# Patient Record
Sex: Female | Born: 1980 | Race: Black or African American | Hispanic: No | Marital: Married | Smoking: Never smoker
Health system: Southern US, Community
[De-identification: ages and names within clinical notes are randomized; demographics above are authoritative.]

## PROBLEM LIST (undated history)

## (undated) ENCOUNTER — Inpatient Hospital Stay (HOSPITAL_COMMUNITY): Payer: Self-pay

## (undated) DIAGNOSIS — O24112 Pre-existing diabetes mellitus, type 2, in pregnancy, second trimester: Secondary | ICD-10-CM

## (undated) DIAGNOSIS — I10 Essential (primary) hypertension: Secondary | ICD-10-CM

## (undated) DIAGNOSIS — O24419 Gestational diabetes mellitus in pregnancy, unspecified control: Secondary | ICD-10-CM

## (undated) HISTORY — PX: NO PAST SURGERIES: SHX2092

## (undated) HISTORY — DX: Pre-existing type 2 diabetes mellitus, in pregnancy, second trimester: O24.112

---

## 2002-08-17 ENCOUNTER — Inpatient Hospital Stay (HOSPITAL_COMMUNITY): Admission: AD | Admit: 2002-08-17 | Discharge: 2002-08-17 | Payer: Self-pay | Admitting: *Deleted

## 2002-10-25 ENCOUNTER — Inpatient Hospital Stay (HOSPITAL_COMMUNITY): Admission: AD | Admit: 2002-10-25 | Discharge: 2002-10-27 | Payer: Self-pay | Admitting: Obstetrics

## 2003-07-08 ENCOUNTER — Inpatient Hospital Stay (HOSPITAL_COMMUNITY): Admission: AD | Admit: 2003-07-08 | Discharge: 2003-07-08 | Payer: Self-pay | Admitting: Obstetrics & Gynecology

## 2004-04-23 ENCOUNTER — Ambulatory Visit: Payer: Self-pay | Admitting: Internal Medicine

## 2004-05-11 ENCOUNTER — Ambulatory Visit: Payer: Self-pay | Admitting: Family Medicine

## 2004-05-11 ENCOUNTER — Ambulatory Visit: Payer: Self-pay | Admitting: *Deleted

## 2004-05-17 ENCOUNTER — Ambulatory Visit: Payer: Self-pay | Admitting: Family Medicine

## 2004-05-17 DIAGNOSIS — F459 Somatoform disorder, unspecified: Secondary | ICD-10-CM | POA: Insufficient documentation

## 2004-05-29 ENCOUNTER — Ambulatory Visit: Payer: Self-pay | Admitting: Family Medicine

## 2004-05-29 DIAGNOSIS — K219 Gastro-esophageal reflux disease without esophagitis: Secondary | ICD-10-CM | POA: Insufficient documentation

## 2004-06-15 ENCOUNTER — Ambulatory Visit: Payer: Self-pay | Admitting: Family Medicine

## 2004-06-29 ENCOUNTER — Ambulatory Visit: Payer: Self-pay | Admitting: Family Medicine

## 2005-03-18 ENCOUNTER — Inpatient Hospital Stay (HOSPITAL_COMMUNITY): Admission: AD | Admit: 2005-03-18 | Discharge: 2005-03-20 | Payer: Self-pay | Admitting: Obstetrics

## 2005-04-10 ENCOUNTER — Inpatient Hospital Stay (HOSPITAL_COMMUNITY): Admission: AD | Admit: 2005-04-10 | Discharge: 2005-04-10 | Payer: Self-pay | Admitting: Obstetrics

## 2005-04-23 ENCOUNTER — Ambulatory Visit: Payer: Self-pay | Admitting: Internal Medicine

## 2005-04-24 ENCOUNTER — Ambulatory Visit: Payer: Self-pay | Admitting: Internal Medicine

## 2005-04-29 ENCOUNTER — Ambulatory Visit: Payer: Self-pay | Admitting: Internal Medicine

## 2005-05-24 ENCOUNTER — Ambulatory Visit: Payer: Self-pay | Admitting: Family Medicine

## 2005-06-14 ENCOUNTER — Ambulatory Visit: Payer: Self-pay | Admitting: Family Medicine

## 2005-06-14 DIAGNOSIS — Z8719 Personal history of other diseases of the digestive system: Secondary | ICD-10-CM | POA: Insufficient documentation

## 2005-08-16 ENCOUNTER — Ambulatory Visit: Payer: Self-pay | Admitting: Internal Medicine

## 2005-09-10 ENCOUNTER — Ambulatory Visit: Payer: Self-pay | Admitting: Family Medicine

## 2005-10-10 ENCOUNTER — Ambulatory Visit: Payer: Self-pay | Admitting: Family Medicine

## 2006-03-05 ENCOUNTER — Ambulatory Visit: Payer: Self-pay | Admitting: Family Medicine

## 2006-04-29 ENCOUNTER — Ambulatory Visit: Payer: Self-pay | Admitting: Family Medicine

## 2006-09-16 ENCOUNTER — Ambulatory Visit: Payer: Self-pay | Admitting: Family Medicine

## 2006-09-16 DIAGNOSIS — B373 Candidiasis of vulva and vagina: Secondary | ICD-10-CM

## 2006-09-16 DIAGNOSIS — B3731 Acute candidiasis of vulva and vagina: Secondary | ICD-10-CM | POA: Insufficient documentation

## 2006-10-22 ENCOUNTER — Encounter (INDEPENDENT_AMBULATORY_CARE_PROVIDER_SITE_OTHER): Payer: Self-pay | Admitting: *Deleted

## 2007-02-13 ENCOUNTER — Ambulatory Visit (HOSPITAL_COMMUNITY): Admission: RE | Admit: 2007-02-13 | Discharge: 2007-02-13 | Payer: Self-pay | Admitting: Obstetrics

## 2007-03-11 ENCOUNTER — Inpatient Hospital Stay (HOSPITAL_COMMUNITY): Admission: AD | Admit: 2007-03-11 | Discharge: 2007-03-11 | Payer: Self-pay | Admitting: Obstetrics

## 2007-07-03 ENCOUNTER — Inpatient Hospital Stay (HOSPITAL_COMMUNITY): Admission: AD | Admit: 2007-07-03 | Discharge: 2007-07-05 | Payer: Self-pay | Admitting: Obstetrics

## 2009-06-15 ENCOUNTER — Emergency Department (HOSPITAL_COMMUNITY): Admission: EM | Admit: 2009-06-15 | Discharge: 2009-06-15 | Payer: Self-pay | Admitting: Family Medicine

## 2009-11-12 ENCOUNTER — Emergency Department (HOSPITAL_COMMUNITY)
Admission: EM | Admit: 2009-11-12 | Discharge: 2009-11-12 | Payer: Self-pay | Source: Home / Self Care | Admitting: Emergency Medicine

## 2009-11-14 ENCOUNTER — Emergency Department (HOSPITAL_COMMUNITY): Admission: EM | Admit: 2009-11-14 | Discharge: 2009-11-14 | Payer: Self-pay | Admitting: Emergency Medicine

## 2009-11-16 ENCOUNTER — Emergency Department (HOSPITAL_COMMUNITY)
Admission: EM | Admit: 2009-11-16 | Discharge: 2009-11-16 | Payer: Self-pay | Source: Home / Self Care | Admitting: Emergency Medicine

## 2009-11-18 ENCOUNTER — Emergency Department (HOSPITAL_COMMUNITY): Admission: EM | Admit: 2009-11-18 | Discharge: 2009-11-18 | Payer: Self-pay | Admitting: Emergency Medicine

## 2009-11-18 ENCOUNTER — Emergency Department (HOSPITAL_COMMUNITY): Admission: EM | Admit: 2009-11-18 | Discharge: 2009-11-19 | Payer: Self-pay | Admitting: Emergency Medicine

## 2009-11-20 ENCOUNTER — Encounter (INDEPENDENT_AMBULATORY_CARE_PROVIDER_SITE_OTHER): Payer: Self-pay | Admitting: Internal Medicine

## 2009-11-23 ENCOUNTER — Encounter (INDEPENDENT_AMBULATORY_CARE_PROVIDER_SITE_OTHER): Payer: Self-pay | Admitting: Internal Medicine

## 2009-12-07 ENCOUNTER — Ambulatory Visit: Payer: Self-pay | Admitting: Internal Medicine

## 2009-12-07 DIAGNOSIS — R109 Unspecified abdominal pain: Secondary | ICD-10-CM | POA: Insufficient documentation

## 2009-12-11 ENCOUNTER — Encounter (INDEPENDENT_AMBULATORY_CARE_PROVIDER_SITE_OTHER): Payer: Self-pay | Admitting: *Deleted

## 2009-12-21 ENCOUNTER — Encounter: Payer: Self-pay | Admitting: Gastroenterology

## 2009-12-21 ENCOUNTER — Ambulatory Visit: Payer: Self-pay | Admitting: Internal Medicine

## 2009-12-21 DIAGNOSIS — R002 Palpitations: Secondary | ICD-10-CM | POA: Insufficient documentation

## 2009-12-21 LAB — CONVERTED CEMR LAB
BUN: 13 mg/dL (ref 6–23)
Basophils Absolute: 0.1 10*3/uL (ref 0.0–0.1)
Basophils Relative: 0 % (ref 0–1)
CO2: 23 meq/L (ref 19–32)
Calcium: 9.7 mg/dL (ref 8.4–10.5)
Chloride: 103 meq/L (ref 96–112)
Creatinine, Ser: 0.6 mg/dL (ref 0.40–1.20)
Eosinophils Absolute: 0.2 10*3/uL (ref 0.0–0.7)
Eosinophils Relative: 1 % (ref 0–5)
Glucose, Bld: 105 mg/dL — ABNORMAL HIGH (ref 70–99)
HCT: 37.5 % (ref 36.0–46.0)
Hemoglobin: 12.1 g/dL (ref 12.0–15.0)
Lymphocytes Relative: 17 % (ref 12–46)
Lymphs Abs: 2.3 10*3/uL (ref 0.7–4.0)
MCHC: 32.3 g/dL (ref 30.0–36.0)
MCV: 83.9 fL (ref 78.0–100.0)
Monocytes Absolute: 0.8 10*3/uL (ref 0.1–1.0)
Monocytes Relative: 5 % (ref 3–12)
Neutro Abs: 10.6 10*3/uL — ABNORMAL HIGH (ref 1.7–7.7)
Neutrophils Relative %: 76 % (ref 43–77)
Platelets: 409 10*3/uL — ABNORMAL HIGH (ref 150–400)
Potassium: 4 meq/L (ref 3.5–5.3)
RBC: 4.47 M/uL (ref 3.87–5.11)
RDW: 13.7 % (ref 11.5–15.5)
Sodium: 140 meq/L (ref 135–145)
TSH: 1.037 microintl units/mL (ref 0.350–4.500)
WBC: 13.9 10*3/uL — ABNORMAL HIGH (ref 4.0–10.5)

## 2009-12-30 ENCOUNTER — Emergency Department (HOSPITAL_COMMUNITY)
Admission: EM | Admit: 2009-12-30 | Discharge: 2009-12-30 | Payer: Self-pay | Source: Home / Self Care | Admitting: Emergency Medicine

## 2010-01-05 ENCOUNTER — Encounter: Admission: RE | Admit: 2010-01-05 | Discharge: 2010-01-05 | Payer: Self-pay | Admitting: Gastroenterology

## 2010-01-12 ENCOUNTER — Ambulatory Visit: Payer: Self-pay | Admitting: Gastroenterology

## 2010-01-13 ENCOUNTER — Emergency Department (HOSPITAL_COMMUNITY)
Admission: EM | Admit: 2010-01-13 | Discharge: 2010-01-13 | Payer: Self-pay | Source: Home / Self Care | Admitting: Family Medicine

## 2010-01-18 ENCOUNTER — Inpatient Hospital Stay (HOSPITAL_COMMUNITY)
Admission: AD | Admit: 2010-01-18 | Discharge: 2010-01-18 | Payer: Self-pay | Source: Home / Self Care | Attending: Obstetrics & Gynecology | Admitting: Obstetrics & Gynecology

## 2010-01-21 ENCOUNTER — Inpatient Hospital Stay (HOSPITAL_COMMUNITY)
Admission: AD | Admit: 2010-01-21 | Discharge: 2010-01-21 | Payer: Self-pay | Source: Home / Self Care | Attending: Obstetrics and Gynecology | Admitting: Obstetrics and Gynecology

## 2010-01-23 ENCOUNTER — Telehealth (INDEPENDENT_AMBULATORY_CARE_PROVIDER_SITE_OTHER): Payer: Self-pay | Admitting: Internal Medicine

## 2010-01-23 ENCOUNTER — Encounter (INDEPENDENT_AMBULATORY_CARE_PROVIDER_SITE_OTHER): Payer: Self-pay | Admitting: Internal Medicine

## 2010-01-23 DIAGNOSIS — D72829 Elevated white blood cell count, unspecified: Secondary | ICD-10-CM | POA: Insufficient documentation

## 2010-01-23 DIAGNOSIS — R7309 Other abnormal glucose: Secondary | ICD-10-CM | POA: Insufficient documentation

## 2010-01-30 ENCOUNTER — Emergency Department (HOSPITAL_COMMUNITY)
Admission: EM | Admit: 2010-01-30 | Discharge: 2010-01-30 | Payer: Self-pay | Source: Home / Self Care | Admitting: Emergency Medicine

## 2010-02-04 NOTE — L&D Delivery Note (Signed)
Delivery Note At 4:04 AM a viable female was delivered via Vaginal, Spontaneous Delivery (Presentation: ;  ).  APGAR: , ; weight .   Placenta status: , .  Cord: 3 vessels with the following complications: None.  Cord pH: not done  Anesthesia: Epidural  Episiotomy: Median Lacerations:  Suture Repair: 2.0 vicryl Est. Blood Loss (mL):   Mom to postpartum.  Baby to nursery-stable.  Keylen Eckenrode A 09/20/2010, 4:24 AM

## 2010-02-24 ENCOUNTER — Encounter: Payer: Self-pay | Admitting: Gastroenterology

## 2010-02-25 ENCOUNTER — Encounter: Payer: Self-pay | Admitting: Internal Medicine

## 2010-03-06 LAB — RPR: RPR: NONREACTIVE

## 2010-03-06 LAB — HIV ANTIBODY (ROUTINE TESTING W REFLEX): HIV: NONREACTIVE

## 2010-03-06 LAB — ABO/RH

## 2010-03-06 LAB — HEPATITIS B SURFACE ANTIGEN: Hepatitis B Surface Ag: NEGATIVE

## 2010-03-06 NOTE — Letter (Signed)
Summary: New Patient letter  [facility]  [address] [place], [place] [address]   Phone: [phone]  Fax: [phone]       12/11/2009 MRN: [id number]  [facility] [address], [place]  [address]  Dear Ms. Rys,  Welcome to the Gastroenterology Division at [facility].    You are scheduled to see Dr. Marina Goodell on 01/30/2010 at 9:30AM on the 3rd floor at [facility], [address].  We ask that you try to arrive at our office 15 minutes prior to your appointment time to allow for check-in.  We would like you to complete the enclosed self-administered evaluation form prior to your visit and bring it with you on the day of your appointment.  We will review it with you.  Also, please bring a complete list of all your medications or, if you prefer, bring the medication bottles and we will list them.  Please bring your insurance card so that we may make a copy of it.  If your insurance requires a referral to see a specialist, please bring your referral form from your primary care physician.  Co-payments are due at the time of your visit and may be paid by cash, check or credit card.     Your office visit will consist of a consult with your physician (includes a physical exam), any laboratory testing he/she may order, scheduling of any necessary diagnostic testing (e.g. x-ray, ultrasound, CT-scan), and scheduling of a procedure (e.g. Endoscopy, Colonoscopy) if required.  Please allow enough time on your schedule to allow for any/all of these possibilities.    If you cannot keep your appointment, please call [phone] to cancel or reschedule prior to your appointment date.  This allows [place] the opportunity to schedule an appointment for another patient in need of care.  If you do not cancel or reschedule by 5 p.m. the business day prior to your appointment date, you will be charged a $50.00 late cancellation/no-show fee.    Thank you for choosing  [facility] for your medical needs.  We appreciate the opportunity to care for you.  Please visit [place] at our website  to learn more about our practice.                     Sincerely,                                                             The Gastroenterology Division

## 2010-03-06 NOTE — Assessment & Plan Note (Signed)
Summary: NEW PT EST // RS   Vital Signs:  Patient profile:   30 year old female Height:      62 inches Weight:      163 pounds BMI:     29.92 Temp:     98.6 degrees F oral BP sitting:   114 / 76  (right arm) Cuff size:   regular  Vitals Entered By: Duard Brady LPN (December 07, 2009 2:55 PM) CC: new to establish - abd pain, bloating, nausea , diarrhea and constipation Is Patient Diabetic? No   CC:  new to establish - abd pain, bloating, nausea , and diarrhea and constipation.  History of Present Illness: 30 year old patient who is seen today as a new patient.  For the past 4 weeks, she has had considerable abdominal pain.  She states that she has been seen at [facility] EDM 4 locations, but no records are available.  She has had a CT abdominal scan.  Associated symptoms include bloating, chills, but no fever.  She has had constipation alternating with diarrhea.  She has had nausea, vomiting, and describes herself as being quite anxious.  She states that she was diagnosed with ulcers approximately 15 years ago in [place].  She states that she has had upper endoscopy by [facility] a few years ago.  that was normal.  She has been placed on a number of medications, including PPI, and anxiety, pancreatic enzymes, and isosmotic, as well as Align.  Her symptoms have not improved.  She states she has lost 10 pounds in weight over the past month.  She apparently  has been seen by a number of local physicians.  Preventive Screening-Counseling & Management  Alcohol-Tobacco     Smoking Status: never  Allergies (verified): No Known Drug Allergies  Past History:  Past Medical History: abdominal pain history of peptic ulcer disease  Past Surgical History: denies  Family History: Reviewed history and no changes required. father age 3.  History of hypertension, and diabetes mother also, history of hypertension, and diabetes one brother two sisters positive for peptic ulcer  disease  Social History: Married Never Smoked 3 childrenSmoking Status:  never  Review of Systems       The patient complains of anorexia and abdominal pain.  The patient denies fever, weight loss, weight gain, vision loss, decreased hearing, hoarseness, chest pain, syncope, dyspnea on exertion, peripheral edema, prolonged cough, headaches, hemoptysis, melena, hematochezia, severe indigestion/heartburn, hematuria, incontinence, genital sores, muscle weakness, suspicious skin lesions, transient blindness, difficulty walking, depression, unusual weight change, abnormal bleeding, enlarged lymph nodes, angioedema, and breast masses.    Physical Exam  General:  overweight-appearing.  anxious no acute distress Head:  Normocephalic and atraumatic without obvious abnormalities. No apparent alopecia or balding. Eyes:  No corneal or conjunctival inflammation noted. EOMI. Perrla. Funduscopic exam benign, without hemorrhages, exudates or papilledema. Vision grossly normal. Ears:  External ear exam shows no significant lesions or deformities.  Otoscopic examination reveals clear canals, tympanic membranes are intact bilaterally without bulging, retraction, inflammation or discharge. Hearing is grossly normal bilaterally. Mouth:  Oral mucosa and oropharynx without lesions or exudates.  Teeth in good repair. Neck:  No deformities, masses, or tenderness noted. Lungs:  Normal respiratory effort, chest expands symmetrically. Lungs are clear to auscultation, no crackles or wheezes. Heart:  resting tachycardia of about 100 Abdomen:  obese soft and nontender.  Bowel sounds active Msk:  No deformity or scoliosis noted of thoracic or lumbar spine.  Pulses:  R and L carotid,radial,femoral,dorsalis pedis and posterior tibial pulses are full and equal bilaterally Extremities:  No clubbing, cyanosis, edema, or deformity noted with normal full range of motion of all joints.   Skin:  Intact without suspicious lesions  or rashes Cervical Nodes:  No lymphadenopathy noted Axillary Nodes:  No palpable lymphadenopathy Inguinal Nodes:  No significant adenopathy Psych:  anxious   Impression & Recommendations:  Problem # 1:  Preventive Health Care (ICD-V70.0)  Problem # 2:  ABDOMINAL PAIN (ICD-789.00)  due to the chronicity of her symptoms and lack of response to multiple medications.  Will refer to GI  Orders: Gastroenterology Referral (GI)  Complete Medication List: 1)  Abx - Unsure On Name - Didn't Bring  .... Bid 2)  Librax 2.5-5 Mg Caps (Clidinium-chlordiazepoxide) .... Q6-8 hrs prn 3)  Align 4 Mg Caps (Probiotic product) .... Qd 4)  Multivitamins Tabs (Multiple vitamin) .... Qd 5)  Diazepam 5 Mg Tabs (Diazepam) .Marland Kitchen.. 1 by mouth q6hrs as needed muscle spasm 6)  Phazyme 180 Mg Caps (Simethicone) .... As needed gas 7)  Dicyclomine Hcl 20 Mg Tabs (Dicyclomine hcl) .... Qid as needed abd. pain 8)  Nexium 20 Mg Cpdr (Esomeprazole magnesium) .... Three times a day 9)  Dexilant 60 Mg Cpdr (Dexlansoprazole) .... Qd  Patient Instructions: 1)  Drink clear liquids only for the next 24 hours, then slowly add other liquids and food as you  tolerate them. 2)  GI evaluation as scheduled 3)  Continue Nexium or Dexilant    Orders Added: 1)  Est. Patient 18-39 years [99395] 2)  Gastroenterology Referral [GI]

## 2010-03-06 NOTE — Assessment & Plan Note (Addendum)
Summary: NECK PAIN//MC   Vital Signs:  Patient profile:   30 year old female Menstrual status:  irregular LMP:     12/12/2009 Weight:      161 pounds Temp:     98.4 degrees F oral Pulse rate:   80 / minute Pulse rhythm:   irregularly irregular Resp:     20 per minute BP sitting:   110 / 70  (left arm) Cuff size:   regular  Vitals Entered By: Gaylyn Cheers RN (December 21, 2009 2:23 PM) CC: neck pain, feels shakey sometimes and heart thumps @ intervals. chills, wants to be tested for everything Is Patient Diabetic? No Pain Assessment Patient in pain? yes     Location: neck Intensity: 3 Type: dull Onset of pain  Intermittent  Does patient need assistance? Functional Status Self care Ambulation Normal Comments Does not take any medications LMP (date): 12/12/2009     Menstrual Status irregular Enter LMP: 12/12/2009   CC:  neck pain, feels shakey sometimes and heart thumps @ intervals. chills, and wants to be tested for everything.  History of Present Illness: 1.  Pain in posterior neck and bilateral trapezius for 1 month.  Also with discomfort in chest.  2.  Wants thyroid tested as feels jittery, palpitations.  Does not have a big appetite.  No diarrhea.  Has had some weight loss.  Feels cold, not heat intolerance.  sometimes distal fingers numb  3.  Abdominal pain:  has resolved--pt. went to another primary earlier in month--did not go to GI specialist as symptoms resolved.  Discussed picking one office to provide primary care.  Allergies (verified): No Known Drug Allergies  Physical Exam  General:  NAD Eyes:  No exophthalmos, no definite lid lag. Neck:  No deformities, masses, or tenderness noted.no thyromegaly and no thyroid nodules or tenderness.   Lungs:  Normal respiratory effort, chest expands symmetrically. Lungs are clear to auscultation, no crackles or wheezes. Heart:  Normal rate and regular rhythm. S1 and S2 normal without gallop, murmur, click, rub  or other extra sounds. Msk:  Tender across bilateral traps to shoulders.  NT over spinous processes. Neurologic:  Fine tremor bilaterally strength normal in all extremities and DTRs symmetrical and normal.     Impression & Recommendations:  Problem # 1:  PALPITATIONS (ICD-785.1)  and tremulousness  Orders: T-Basic Metabolic Panel [phone]) T-CBC w/Diff [phone]) T-TSH ([phone])  Complete Medication List: 1)  Terazol 0.04% Vaginal Cream  .... 1 applic pv qhs x7days 2)  Abx - Unsure On Name - Didn't Bring  .... Bid 3)  Librax 2.5-5 Mg Caps (Clidinium-chlordiazepoxide) .... Q6-8 hrs prn 4)  Align 4 Mg Caps (Probiotic product) .... Qd 5)  Multivitamins Tabs (Multiple vitamin) .... Qd 6)  Diazepam 5 Mg Tabs (Diazepam) .Marland Kitchen.. 1 by mouth q6hrs as needed muscle spasm 7)  Phazyme 180 Mg Caps (Simethicone) .... As needed gas 8)  Dicyclomine Hcl 20 Mg Tabs (Dicyclomine hcl) .... Qid as needed abd. pain 9)  Nexium 20 Mg Cpdr (Esomeprazole magnesium) .... Three times a day 10)  Dexilant 60 Mg Cpdr (Dexlansoprazole) .... Qd  Patient Instructions: 1)  Call if symptoms worsen   Orders Added: 1)  T-Basic Metabolic Panel [[id number]] 2)  T-CBC w/Diff [[id number]] 3)  T-TSH [[id number]] 4)  Est. Patient Level III [02725]  Appended Document: NECK PAIN//MC Received paperwork from [facility] 02/12/10--pt. did go to Dr. Ewing Schlein on 10/17 and 11/23/2009:  Upper GI series performed, but no follow up comment on  that.  Diagnosed with reflux esophagitis and given Dexilant samples to tak einstead of Nexium giving at first visit.  Was told to follow up with [place] for neck complaints.  Also given Librax for abdominal pain and bloating.   Clinical Lists Changes       Appended Document: NECK PAIN//MC CT of abdomen and upper GI with small bowel follow through were normal on follow up with Dr. Ewing Schlein  01/23/10.

## 2010-03-06 NOTE — Letter (Signed)
Summary: New Patient letter  [facility]  [address] [place], [place] [address]   Phone: [phone]  Fax: [phone]       12/21/2009 MRN: [id number]  [facility] [address] [place], [place]  [address]  Dear Ms. Baisch,  Welcome to the Gastroenterology Division at [facility].    You are scheduled to see Dr.  Jarold Motto on 01-12-10 at 8:30am on the 3rd floor at [facility], [address].  We ask that you try to arrive at our office 15 minutes prior to your appointment time to allow for check-in.  We would like you to complete the enclosed self-administered evaluation form prior to your visit and bring it with you on the day of your appointment.  We will review it with you.  Also, please bring a complete list of all your medications or, if you prefer, bring the medication bottles and we will list them.  Please bring your insurance card so that we may make a copy of it.  If your insurance requires a referral to see a specialist, please bring your referral form from your primary care physician.  Co-payments are due at the time of your visit and may be paid by cash, check or credit card. If no insurance coverage $184 is due at time of service.    Your office visit will consist of a consult with your physician (includes a physical exam), any laboratory testing he/she may order, scheduling of any necessary diagnostic testing (e.g. x-ray, ultrasound, CT-scan), and scheduling of a procedure (e.g. Endoscopy, Colonoscopy) if required.  Please allow enough time on your schedule to allow for any/all of these possibilities.    If you cannot keep your appointment, please call [phone] to cancel or reschedule prior to your appointment date.  This allows [place] the opportunity to schedule an appointment for another patient in need of care.  If you do not cancel or reschedule by 5 p.m. the business day prior to your appointment date, you will be charged a  $50.00 late cancellation/no-show fee.    Thank you for choosing [facility] for your medical needs.  We appreciate the opportunity to care for you.  Please visit [place] at our website  to learn more about our practice.                     Sincerely,                                                             The Gastroenterology Division

## 2010-03-08 NOTE — Progress Notes (Signed)
  Phone Note Outgoing Call   Summary of Call: Appt. for 12.27 cancelled Let her know her labs in November, including thyroid testing, were okay other than a mildy high glucose and white blood cell count Please have pt. come in for repeat CBC and fasting glucose in next month. See if she is still having palpitations that are causing symptoms.  Initial call taken by: Julieanne Manson MD,  January 23, 2010 5:35 AM  Follow-up for Phone Call        spoke with pt's husband and he let me know that she  is pregnant... husband says she is still having palpations and her symptoms with her stomach... [facility].... pt cancelled the appt with [facility] on 01-30-10... they would like to know do you need to see them.... husband will call when he knows his schedule in Jan. for pt to come and do labs Follow-up by: [place] Shannon,  January 23, 2010 9:16 AM  Additional Follow-up for Phone Call Additional follow up Details #1::        Check to see what meds she is actually taking right now--need to make sure they are okay with pregnancy Did they address her syptoms at [facility]? Need that record. For now, just want repeat labs done and make sure she is on Prenatal vitamin and not taking any meds that could cause a problem   Additional Follow-up by: Julieanne Manson MD,  January 24, 2010 2:28 PM  New Problems: HYPERGLYCEMIA, BORDERLINE (ICD-790.29) LEUKOCYTOSIS (ICD-288.60)   Additional Follow-up for Phone Call Additional follow up Details #2::    spoke with pt's husband and he says the pt is no longer taking any meds but prenatal vitamins.... husband says that they did mention the problem to [facility] when they went and they are watching her with follow ups... husband says he will call us next week to set up lab appt... will retrieve medical records from [facility] and give them to shelia so she will put them in the right place. Follow-up by: [place] Shannon,  January 26, 2010  8:53 AM  New Problems: HYPERGLYCEMIA, BORDERLINE (ICD-790.29) LEUKOCYTOSIS (ICD-288.60)

## 2010-03-10 ENCOUNTER — Emergency Department (HOSPITAL_COMMUNITY)
Admission: EM | Admit: 2010-03-10 | Discharge: 2010-03-10 | Disposition: A | Discharge status: Home / Self Care | Payer: Medicaid Other | Attending: Emergency Medicine | Admitting: Emergency Medicine

## 2010-03-10 DIAGNOSIS — O99891 Other specified diseases and conditions complicating pregnancy: Secondary | ICD-10-CM | POA: Insufficient documentation

## 2010-03-10 DIAGNOSIS — R5383 Other fatigue: Secondary | ICD-10-CM | POA: Insufficient documentation

## 2010-03-10 DIAGNOSIS — R209 Unspecified disturbances of skin sensation: Secondary | ICD-10-CM | POA: Insufficient documentation

## 2010-03-10 DIAGNOSIS — R5381 Other malaise: Secondary | ICD-10-CM | POA: Insufficient documentation

## 2010-03-10 LAB — URINALYSIS, ROUTINE W REFLEX MICROSCOPIC
Hgb urine dipstick: NEGATIVE
Ketones, ur: NEGATIVE mg/dL
Protein, ur: NEGATIVE mg/dL
Specific Gravity, Urine: 1.008 (ref 1.005–1.030)
Urine Glucose, Fasting: NEGATIVE mg/dL
pH: 6.5 (ref 5.0–8.0)

## 2010-03-10 LAB — URINE MICROSCOPIC-ADD ON

## 2010-03-10 LAB — POCT I-STAT, CHEM 8
BUN: <3 mg/dL — ABNORMAL LOW (ref 6–23)
Calcium, Ion: 1.19 mmol/L (ref 1.12–1.32)
Chloride: 102 mEq/L (ref 96–112)
Creatinine, Ser: 0.5 mg/dL (ref 0.4–1.2)
Glucose, Bld: 148 mg/dL — ABNORMAL HIGH (ref 70–99)
HCT: 36 % (ref 36.0–46.0)
Potassium: 3.3 mEq/L — ABNORMAL LOW (ref 3.5–5.1)
Sodium: 137 mEq/L (ref 135–145)
TCO2: 22 mmol/L (ref 0–100)

## 2010-04-02 ENCOUNTER — Encounter (INDEPENDENT_AMBULATORY_CARE_PROVIDER_SITE_OTHER): Payer: Self-pay | Admitting: Internal Medicine

## 2010-04-12 NOTE — Letter (Signed)
Summary: [facility]//OFFICE VISIT  [facility]//OFFICE VISIT   Imported By: Arta Bruce 04/04/2010 11:32:21  _____________________________________________________________________  External Attachment:    Type:   Image     Comment:   External Document

## 2010-04-12 NOTE — Letter (Signed)
Summary: [facility]//OFFICE VISIT  [facility]//OFFICE VISIT   Imported By: Arta Bruce 04/04/2010 11:35:03  _____________________________________________________________________  External Attachment:    Type:   Image     Comment:   External Document

## 2010-04-12 NOTE — Miscellaneous (Signed)
Summary: ED visit for GI concerns/pregnancy  Clinical Lists Changes   ED visit on 01/21/10:  Pt. at [facility] ED for vomiting.  Recent previous visit for same.  Pt. at 5.[redacted] weeks gestation pregnancy on day of visit.  Pt. felt a continuation of problems prior to pregnancy.  Extensive work up prior,reportedly including CT, and was to have upper GI series with Dr Ewing Schlein and no findings--treated with Librax and Dexilant at the time.   Pt. treated for nausea and told to eat frequent small meals. Has follow up with OB

## 2010-04-12 NOTE — Letter (Signed)
Summary: [facility]/PROGRESS NOTE  [facility]/PROGRESS NOTE   Imported By: Arta Bruce 04/04/2010 11:36:26  _____________________________________________________________________  External Attachment:    Type:   Image     Comment:   External Document

## 2010-04-16 LAB — CBC
HCT: 35.9 % — ABNORMAL LOW (ref 36.0–46.0)
HCT: 35.9 % — ABNORMAL LOW (ref 36.0–46.0)
Hemoglobin: 12.2 g/dL (ref 12.0–15.0)
Hemoglobin: 12.2 g/dL (ref 12.0–15.0)
MCH: 27.4 pg (ref 26.0–34.0)
MCH: 27.5 pg (ref 26.0–34.0)
MCHC: 34 g/dL (ref 30.0–36.0)
MCHC: 34 g/dL (ref 30.0–36.0)
MCV: 80.5 fL (ref 78.0–100.0)
MCV: 80.9 fL (ref 78.0–100.0)
Platelets: 375 10*3/uL (ref 150–400)
Platelets: 383 10*3/uL (ref 150–400)
RBC: 4.44 MIL/uL (ref 3.87–5.11)
RBC: 4.46 MIL/uL (ref 3.87–5.11)
RDW: 13.5 % (ref 11.5–15.5)
RDW: 13.7 % (ref 11.5–15.5)
WBC: 11.9 10*3/uL — ABNORMAL HIGH (ref 4.0–10.5)
WBC: 12.4 10*3/uL — ABNORMAL HIGH (ref 4.0–10.5)

## 2010-04-16 LAB — COMPREHENSIVE METABOLIC PANEL
ALT: 22 U/L (ref 0–35)
ALT: 31 U/L (ref 0–35)
AST: 18 U/L (ref 0–37)
AST: 22 U/L (ref 0–37)
Albumin: 3.6 g/dL (ref 3.5–5.2)
Albumin: 3.8 g/dL (ref 3.5–5.2)
Alkaline Phosphatase: 61 U/L (ref 39–117)
Alkaline Phosphatase: 62 U/L (ref 39–117)
BUN: 3 mg/dL — ABNORMAL LOW (ref 6–23)
BUN: 4 mg/dL — ABNORMAL LOW (ref 6–23)
CO2: 26 mEq/L (ref 19–32)
CO2: 27 mEq/L (ref 19–32)
Calcium: 10.2 mg/dL (ref 8.4–10.5)
Calcium: 9.2 mg/dL (ref 8.4–10.5)
Chloride: 101 mEq/L (ref 96–112)
Chloride: 102 mEq/L (ref 96–112)
Creatinine, Ser: 0.48 mg/dL (ref 0.4–1.2)
Creatinine, Ser: 0.6 mg/dL (ref 0.4–1.2)
GFR calc Af Amer: >60 mL/min (ref >60)
GFR calc Af Amer: >60 mL/min (ref >60)
GFR calc non Af Amer: >60 mL/min (ref >60)
GFR calc non Af Amer: >60 mL/min (ref >60)
Glucose, Bld: 102 mg/dL — ABNORMAL HIGH (ref 70–99)
Glucose, Bld: 89 mg/dL (ref 70–99)
Potassium: 3.8 mEq/L (ref 3.5–5.1)
Potassium: 3.8 mEq/L (ref 3.5–5.1)
Sodium: 136 mEq/L (ref 135–145)
Sodium: 138 mEq/L (ref 135–145)
Total Bilirubin: 0.3 mg/dL (ref 0.3–1.2)
Total Bilirubin: 0.3 mg/dL (ref 0.3–1.2)
Total Protein: 7.5 g/dL (ref 6.0–8.3)
Total Protein: 8.2 g/dL (ref 6.0–8.3)

## 2010-04-16 LAB — URINALYSIS, ROUTINE W REFLEX MICROSCOPIC
Bilirubin Urine: NEGATIVE
Bilirubin Urine: NEGATIVE
Glucose, UA: NEGATIVE mg/dL
Glucose, UA: NEGATIVE mg/dL
Hgb urine dipstick: NEGATIVE
Hgb urine dipstick: NEGATIVE
Ketones, ur: NEGATIVE mg/dL
Ketones, ur: NEGATIVE mg/dL
Nitrite: NEGATIVE
Nitrite: NEGATIVE
Protein, ur: NEGATIVE mg/dL
Protein, ur: NEGATIVE mg/dL
Specific Gravity, Urine: 1.02 (ref 1.005–1.030)
Specific Gravity, Urine: <1.005 — ABNORMAL LOW (ref 1.005–1.030)
Urobilinogen, UA: 0.2 mg/dL (ref 0.0–1.0)
Urobilinogen, UA: 0.2 mg/dL (ref 0.0–1.0)
pH: 6.5 (ref 5.0–8.0)
pH: 6.5 (ref 5.0–8.0)

## 2010-04-16 LAB — URINE MICROSCOPIC-ADD ON

## 2010-04-16 LAB — POCT PREGNANCY, URINE: Preg Test, Ur: POSITIVE

## 2010-04-16 LAB — H. PYLORI ANTIBODY, IGG: H Pylori IgG: 4.1 {ISR} — ABNORMAL HIGH

## 2010-04-17 LAB — POCT PREGNANCY, URINE
Preg Test, Ur: POSITIVE
Preg Test, Ur: POSITIVE

## 2010-04-18 LAB — DIFFERENTIAL
Basophils Absolute: 0 10*3/uL (ref 0.0–0.1)
Eosinophils Absolute: 0.1 10*3/uL (ref 0.0–0.7)
Lymphocytes Relative: 18 % (ref 12–46)
Lymphs Abs: 2.5 10*3/uL (ref 0.7–4.0)
Neutrophils Relative %: 77 % (ref 43–77)

## 2010-04-18 LAB — URINALYSIS, ROUTINE W REFLEX MICROSCOPIC
Bilirubin Urine: NEGATIVE
Glucose, UA: NEGATIVE mg/dL
Hgb urine dipstick: NEGATIVE
Ketones, ur: NEGATIVE mg/dL
Nitrite: NEGATIVE
Protein, ur: NEGATIVE mg/dL
Specific Gravity, Urine: 1.003 — ABNORMAL LOW (ref 1.005–1.030)
Urobilinogen, UA: 0.2 mg/dL (ref 0.0–1.0)
pH: 6.5 (ref 5.0–8.0)

## 2010-04-18 LAB — COMPREHENSIVE METABOLIC PANEL
ALT: 25 U/L (ref 0–35)
CO2: 27 mEq/L (ref 19–32)
Calcium: 9.4 mg/dL (ref 8.4–10.5)
Creatinine, Ser: 0.63 mg/dL (ref 0.4–1.2)
GFR calc non Af Amer: >60 mL/min (ref >60)
Glucose, Bld: 124 mg/dL — ABNORMAL HIGH (ref 70–99)

## 2010-04-18 LAB — CBC
HCT: 37 % (ref 36.0–46.0)
Hemoglobin: 12.6 g/dL (ref 12.0–15.0)
MCH: 28.7 pg (ref 26.0–34.0)
MCHC: 34.1 g/dL (ref 30.0–36.0)

## 2010-04-18 LAB — POCT PREGNANCY, URINE
Preg Test, Ur: NEGATIVE
Preg Test, Ur: NEGATIVE

## 2010-04-18 LAB — LIPASE, BLOOD: Lipase: 28 U/L (ref 11–59)

## 2010-04-18 LAB — POCT CARDIAC MARKERS: CKMB, poc: <1 ng/mL — ABNORMAL LOW (ref 1.0–8.0)

## 2010-04-19 LAB — COMPREHENSIVE METABOLIC PANEL
AST: 27 U/L (ref 0–37)
Albumin: 3.7 g/dL (ref 3.5–5.2)
Alkaline Phosphatase: 59 U/L (ref 39–117)
BUN: 9 mg/dL (ref 6–23)
CO2: 29 mEq/L (ref 19–32)
Chloride: 103 mEq/L (ref 96–112)
Creatinine, Ser: 0.6 mg/dL (ref 0.4–1.2)
GFR calc non Af Amer: >60 mL/min (ref >60)
Potassium: 4.2 mEq/L (ref 3.5–5.1)
Total Bilirubin: 0.6 mg/dL (ref 0.3–1.2)

## 2010-04-19 LAB — URINE MICROSCOPIC-ADD ON

## 2010-04-19 LAB — POCT URINALYSIS DIPSTICK
Ketones, ur: NEGATIVE mg/dL
Protein, ur: 100 mg/dL — AB
pH: 6 (ref 5.0–8.0)

## 2010-04-19 LAB — URINALYSIS, ROUTINE W REFLEX MICROSCOPIC
Bilirubin Urine: NEGATIVE
Glucose, UA: NEGATIVE mg/dL
Ketones, ur: NEGATIVE mg/dL
pH: 6 (ref 5.0–8.0)

## 2010-04-19 LAB — CBC
Hemoglobin: 12.3 g/dL (ref 12.0–15.0)
MCH: 28.2 pg (ref 26.0–34.0)
MCV: 85.1 fL (ref 78.0–100.0)
RBC: 4.36 MIL/uL (ref 3.87–5.11)
WBC: 7.6 10*3/uL (ref 4.0–10.5)

## 2010-04-19 LAB — LIPASE, BLOOD: Lipase: 32 U/L (ref 11–59)

## 2010-04-19 LAB — DIFFERENTIAL
Basophils Absolute: 0 10*3/uL (ref 0.0–0.1)
Basophils Relative: 0 % (ref 0–1)
Eosinophils Relative: 2 % (ref 0–5)
Lymphocytes Relative: 24 % (ref 12–46)
Monocytes Absolute: 0.5 10*3/uL (ref 0.1–1.0)
Neutro Abs: 5 10*3/uL (ref 1.7–7.7)

## 2010-04-19 LAB — URINE CULTURE: Culture  Setup Time: 201110111524

## 2010-06-22 NOTE — Op Note (Signed)
   NAME:  DACI, STUBBE                         ACCOUNT NO.:  [id number]   MEDICAL RECORD NO.:  [id number]                   PATIENT TYPE:  INP   LOCATION:  9131                                 FACILITY:  [facility]   PHYSICIAN:  Kathreen Cosier, M.D.           DATE OF BIRTH:  10-11-1980   DATE OF PROCEDURE:  10/25/2002  DATE OF DISCHARGE:                                 OPERATIVE REPORT   PREOPERATIVE DIAGNOSIS:  Pushed to +3 station and exhausted.   PROCEDURE:  Vacuum application.   The patient is a 30 year old primigravida who pushed for an hour after being  fully dilated, then she pushed to +3 station.  There was a lot of molding.  The perineum was injected with 1% Xylocaine 10 mL, and the midline  episiotomy cut.  The patient was exhausted.  The vacuum applied and delivery  effected through one contraction.  There was no pop-off.  She had a fourth  degree extension.  The rectum was repaired in two layers, the anal sphincter  repaired, and then the episiotomy closed in a normal manner.  The placenta  was spontaneous.  Post repair with 2-0 Vicryl, the rectum was intact and the  anal sphincter intact.  The episiotomy closed in a normal manner with 2-0  Vicryl.  The patient tolerated the procedure well.                                               Kathreen Cosier, M.D.    BAM/MEDQ  D:  10/25/2002  T:  10/26/2002  Job:  [id number]

## 2010-07-11 ENCOUNTER — Inpatient Hospital Stay (HOSPITAL_COMMUNITY)
Admission: AD | Admit: 2010-07-11 | Discharge: 2010-07-11 | Disposition: A | Discharge status: Home / Self Care | Payer: Medicaid Other | Source: Ambulatory Visit | Attending: Obstetrics | Admitting: Obstetrics

## 2010-07-11 DIAGNOSIS — J40 Bronchitis, not specified as acute or chronic: Secondary | ICD-10-CM

## 2010-07-11 DIAGNOSIS — O99891 Other specified diseases and conditions complicating pregnancy: Secondary | ICD-10-CM | POA: Insufficient documentation

## 2010-07-11 DIAGNOSIS — O9989 Other specified diseases and conditions complicating pregnancy, childbirth and the puerperium: Secondary | ICD-10-CM

## 2010-07-15 ENCOUNTER — Inpatient Hospital Stay (HOSPITAL_COMMUNITY)
Admission: AD | Admit: 2010-07-15 | Discharge: 2010-07-15 | Disposition: A | Discharge status: Home / Self Care | Payer: Medicaid Other | Source: Ambulatory Visit | Attending: Obstetrics | Admitting: Obstetrics

## 2010-07-15 DIAGNOSIS — J069 Acute upper respiratory infection, unspecified: Secondary | ICD-10-CM

## 2010-07-15 DIAGNOSIS — O99891 Other specified diseases and conditions complicating pregnancy: Secondary | ICD-10-CM | POA: Insufficient documentation

## 2010-07-15 DIAGNOSIS — O9989 Other specified diseases and conditions complicating pregnancy, childbirth and the puerperium: Secondary | ICD-10-CM

## 2010-07-21 ENCOUNTER — Inpatient Hospital Stay (HOSPITAL_COMMUNITY)
Admission: AD | Admit: 2010-07-21 | Discharge: 2010-07-21 | Disposition: A | Discharge status: Home / Self Care | Payer: Medicaid Other | Source: Ambulatory Visit | Attending: Obstetrics | Admitting: Obstetrics

## 2010-07-21 DIAGNOSIS — O99891 Other specified diseases and conditions complicating pregnancy: Secondary | ICD-10-CM | POA: Insufficient documentation

## 2010-07-21 DIAGNOSIS — O9989 Other specified diseases and conditions complicating pregnancy, childbirth and the puerperium: Secondary | ICD-10-CM

## 2010-07-21 DIAGNOSIS — J069 Acute upper respiratory infection, unspecified: Secondary | ICD-10-CM | POA: Insufficient documentation

## 2010-07-21 DIAGNOSIS — N393 Stress incontinence (female) (male): Secondary | ICD-10-CM

## 2010-08-05 ENCOUNTER — Inpatient Hospital Stay (HOSPITAL_COMMUNITY)
Admission: AD | Admit: 2010-08-05 | Discharge: 2010-08-05 | Disposition: A | Discharge status: Home / Self Care | Payer: Medicaid Other | Source: Ambulatory Visit | Attending: Obstetrics | Admitting: Obstetrics

## 2010-08-05 ENCOUNTER — Inpatient Hospital Stay (HOSPITAL_COMMUNITY): Payer: Medicaid Other

## 2010-08-05 DIAGNOSIS — R05 Cough: Secondary | ICD-10-CM | POA: Insufficient documentation

## 2010-08-05 DIAGNOSIS — O99891 Other specified diseases and conditions complicating pregnancy: Secondary | ICD-10-CM | POA: Insufficient documentation

## 2010-08-05 DIAGNOSIS — R059 Cough, unspecified: Secondary | ICD-10-CM | POA: Insufficient documentation

## 2010-08-05 LAB — URINALYSIS, ROUTINE W REFLEX MICROSCOPIC
Bilirubin Urine: NEGATIVE
Glucose, UA: NEGATIVE mg/dL
Hgb urine dipstick: NEGATIVE
Ketones, ur: NEGATIVE mg/dL
Nitrite: NEGATIVE
Specific Gravity, Urine: 1.015 (ref 1.005–1.030)
pH: 6 (ref 5.0–8.0)

## 2010-08-05 LAB — COMPREHENSIVE METABOLIC PANEL
ALT: 16 U/L (ref 0–35)
AST: 19 U/L (ref 0–37)
Albumin: 2.5 g/dL — ABNORMAL LOW (ref 3.5–5.2)
Alkaline Phosphatase: 93 U/L (ref 39–117)
Calcium: 9.2 mg/dL (ref 8.4–10.5)
Potassium: 3.9 mEq/L (ref 3.5–5.1)
Sodium: 133 mEq/L — ABNORMAL LOW (ref 135–145)
Total Protein: 6.4 g/dL (ref 6.0–8.3)

## 2010-08-05 LAB — URINE MICROSCOPIC-ADD ON

## 2010-08-05 LAB — CBC
Hemoglobin: 9.4 g/dL — ABNORMAL LOW (ref 12.0–15.0)
MCH: 23.4 pg — ABNORMAL LOW (ref 26.0–34.0)
MCHC: 32.4 g/dL (ref 30.0–36.0)
Platelets: 291 10*3/uL (ref 150–400)

## 2010-09-11 ENCOUNTER — Encounter (HOSPITAL_COMMUNITY): Payer: Self-pay | Admitting: *Deleted

## 2010-09-11 ENCOUNTER — Inpatient Hospital Stay (HOSPITAL_COMMUNITY)
Admission: AD | Admit: 2010-09-11 | Discharge: 2010-09-11 | Disposition: A | Discharge status: Home / Self Care | Payer: Medicaid Other | Source: Ambulatory Visit | Attending: Obstetrics | Admitting: Obstetrics

## 2010-09-11 DIAGNOSIS — O479 False labor, unspecified: Secondary | ICD-10-CM | POA: Insufficient documentation

## 2010-09-11 NOTE — Progress Notes (Signed)
Pt. C/O UC's. To room # 5 for triage.

## 2010-09-19 ENCOUNTER — Encounter (HOSPITAL_COMMUNITY): Payer: Self-pay | Admitting: *Deleted

## 2010-09-19 ENCOUNTER — Inpatient Hospital Stay (HOSPITAL_COMMUNITY)
Admission: AD | Admit: 2010-09-19 | Discharge: 2010-09-19 | Disposition: A | Discharge status: Home / Self Care | Payer: Medicaid Other | Source: Ambulatory Visit | Attending: Obstetrics | Admitting: Obstetrics

## 2010-09-19 ENCOUNTER — Inpatient Hospital Stay (HOSPITAL_COMMUNITY): Payer: Medicaid Other | Admitting: Anesthesiology

## 2010-09-19 ENCOUNTER — Encounter (HOSPITAL_COMMUNITY): Payer: Self-pay

## 2010-09-19 ENCOUNTER — Encounter (HOSPITAL_COMMUNITY): Payer: Self-pay | Admitting: Anesthesiology

## 2010-09-19 ENCOUNTER — Inpatient Hospital Stay (HOSPITAL_COMMUNITY)
Admission: AD | Admit: 2010-09-19 | Discharge: 2010-09-22 | DRG: 775 | Disposition: A | Discharge status: Home / Self Care | Payer: Medicaid Other | Source: Ambulatory Visit | Attending: Obstetrics | Admitting: Obstetrics

## 2010-09-19 DIAGNOSIS — O99892 Other specified diseases and conditions complicating childbirth: Principal | ICD-10-CM | POA: Diagnosis present

## 2010-09-19 DIAGNOSIS — Z22330 Carrier of Group B streptococcus: Secondary | ICD-10-CM

## 2010-09-19 DIAGNOSIS — D649 Anemia, unspecified: Secondary | ICD-10-CM | POA: Diagnosis not present

## 2010-09-19 DIAGNOSIS — O9903 Anemia complicating the puerperium: Secondary | ICD-10-CM | POA: Diagnosis not present

## 2010-09-19 DIAGNOSIS — O479 False labor, unspecified: Secondary | ICD-10-CM | POA: Insufficient documentation

## 2010-09-19 LAB — CBC
Hemoglobin: 9.1 g/dL — ABNORMAL LOW (ref 12.0–15.0)
MCH: 20.4 pg — ABNORMAL LOW (ref 26.0–34.0)
MCHC: 31 g/dL (ref 30.0–36.0)
MCV: 65.9 fL — ABNORMAL LOW (ref 78.0–100.0)
Platelets: 265 10*3/uL (ref 150–400)

## 2010-09-19 MED ORDER — EPHEDRINE 5 MG/ML INJ: 10.0000 mg | INTRAVENOUS | Status: DC | PRN

## 2010-09-19 MED ORDER — FENTANYL 2.5 MCG/ML BUPIVACAINE 1/10 % EPIDURAL INFUSION (WH - ANES)
14.0000 mL/h | INTRAMUSCULAR | Status: DC
  Administered 2010-09-19 – 2010-09-20 (×2): 14 mL/h via EPIDURAL
  Filled 2010-09-19 (×2): qty 60

## 2010-09-19 MED ORDER — PENICILLIN G POTASSIUM 5000000 UNITS IJ SOLR
5.0000 10*6.[IU] | Freq: Once | INTRAVENOUS | Status: DC
  Administered 2010-09-19: 5 10*6.[IU] via INTRAVENOUS
  Filled 2010-09-19: qty 5

## 2010-09-19 MED ORDER — LACTATED RINGERS IV SOLN
INTRAVENOUS | Status: DC
  Administered 2010-09-20: 04:00:00 via INTRAVENOUS

## 2010-09-19 MED ORDER — NALBUPHINE SYRINGE 5 MG/0.5 ML
10.0000 mg | INJECTION | INTRAMUSCULAR | Status: DC | PRN
  Administered 2010-09-19: 10 mg via INTRAVENOUS
  Filled 2010-09-19: qty 1

## 2010-09-19 MED ORDER — PHENYLEPHRINE 40 MCG/ML (10ML) SYRINGE FOR IV PUSH (FOR BLOOD PRESSURE SUPPORT)
80.0000 ug | PREFILLED_SYRINGE | INTRAVENOUS | Status: DC | PRN
  Filled 2010-09-19: qty 5

## 2010-09-19 MED ORDER — PENICILLIN G POTASSIUM 5000000 UNITS IJ SOLR
2.5000 10*6.[IU] | INTRAVENOUS | Status: DC
  Administered 2010-09-20: 2.5 10*6.[IU] via INTRAVENOUS
  Filled 2010-09-19 (×4): qty 2.5

## 2010-09-19 MED ORDER — OXYCODONE-ACETAMINOPHEN 5-325 MG PO TABS: 2.0000 | ORAL_TABLET | ORAL | Status: DC | PRN

## 2010-09-19 MED ORDER — DIPHENHYDRAMINE HCL 50 MG/ML IJ SOLN: 12.5000 mg | INTRAMUSCULAR | Status: DC | PRN

## 2010-09-19 MED ORDER — LACTATED RINGERS IV SOLN
500.0000 mL | INTRAVENOUS | Status: DC | PRN
Start: 2010-09-19 — End: 2010-09-20

## 2010-09-19 MED ORDER — CITRIC ACID-SODIUM CITRATE 334-500 MG/5ML PO SOLN: 30.0000 mL | ORAL | Status: DC | PRN

## 2010-09-19 MED ORDER — EPHEDRINE 5 MG/ML INJ
10.0000 mg | INTRAVENOUS | Status: DC | PRN
  Filled 2010-09-19: qty 4

## 2010-09-19 MED ORDER — IBUPROFEN 600 MG PO TABS: 600.0000 mg | ORAL_TABLET | Freq: Four times a day (QID) | ORAL | Status: DC | PRN

## 2010-09-19 MED ORDER — LIDOCAINE HCL (PF) 1 % IJ SOLN
30.0000 mL | INTRAMUSCULAR | Status: DC | PRN
  Administered 2010-09-20: 30 mL via SUBCUTANEOUS
  Filled 2010-09-19: qty 30

## 2010-09-19 MED ORDER — OXYTOCIN 20 UNITS IN LACTATED RINGERS INFUSION - SIMPLE: 125.0000 mL/h | INTRAVENOUS | Status: AC

## 2010-09-19 MED ORDER — ONDANSETRON HCL 4 MG/2ML IJ SOLN: 4.0000 mg | Freq: Four times a day (QID) | INTRAMUSCULAR | Status: DC | PRN

## 2010-09-19 MED ORDER — ACETAMINOPHEN 325 MG PO TABS: 650.0000 mg | ORAL_TABLET | ORAL | Status: DC | PRN

## 2010-09-19 MED ORDER — FLEET ENEMA 7-19 GM/118ML RE ENEM: 1.0000 | ENEMA | RECTAL | Status: DC | PRN

## 2010-09-19 MED ORDER — LACTATED RINGERS IV SOLN: 500.0000 mL | Freq: Once | INTRAVENOUS | Status: DC

## 2010-09-19 MED ORDER — OXYTOCIN BOLUS FROM INFUSION
500.0000 mL | Freq: Once | INTRAVENOUS | Status: DC
  Administered 2010-09-20: 500 mL via INTRAVENOUS
  Filled 2010-09-19: qty 1000
  Filled 2010-09-19: qty 500

## 2010-09-19 MED ORDER — PHENYLEPHRINE 40 MCG/ML (10ML) SYRINGE FOR IV PUSH (FOR BLOOD PRESSURE SUPPORT): 80.0000 ug | PREFILLED_SYRINGE | INTRAVENOUS | Status: DC | PRN

## 2010-09-19 NOTE — Progress Notes (Signed)
Replacing pulse ox monitor

## 2010-09-19 NOTE — H&P (Signed)
This is Dr. Francoise Ceo dictating the history and physical on  Elizabeth Warren She's a 30 year old gravida 4 para [address] [facility] 09/20/2010 She's been a positive GBS and received penicillin on admission tonight Her membranes ruptured spontaneously at 8:30 PM tonight fluid was clear She is contracting every 3 minutes the cervix is 5 cm 100% with the vertex at a -2 station Past medical history negative Social history negative Family history negative System review noncontributory Physical exam HEENT negative Breasts no masses Lungs clear to P&A Heart regular rhythm no murmurs no gallops Abdomen term Pelvic as described above Legs negative and

## 2010-09-19 NOTE — Anesthesia Preprocedure Evaluation (Signed)
Anesthesia Evaluation  Name, MR# and DOB Patient awake  General Assessment Comment  Reviewed: Allergy & Precautions, H&P , Patient's Chart, lab work & pertinent test results  Airway Mallampati: II TM Distance: >3 FB Neck ROM: full    Dental  (+) Teeth Intact   Pulmonary  clear to auscultation  breath sounds clear to auscultation none    Cardiovascular regular Normal    Neuro/Psych   GI/Hepatic/Renal   Endo/Other    Abdominal   Musculoskeletal   Hematology   Peds  Reproductive/Obstetrics (+) Pregnancy    Anesthesia Other Findings                 Anesthesia Physical Anesthesia Plan  ASA: II  Anesthesia Plan: Epidural   Post-op Pain Management:    Induction:   Airway Management Planned:   Additional Equipment:   Intra-op Plan:   Post-operative Plan:   Informed Consent: I have reviewed the patients History and Physical, chart, labs and discussed the procedure including the risks, benefits and alternatives for the proposed anesthesia with the patient or authorized representative who has indicated his/her understanding and acceptance.   Dental Advisory Given  Plan Discussed with: CRNA and Surgeon  Anesthesia Plan Comments: (Labs checked- platelets confirmed with RN in room. Fetal heart tracing, per RN, reportedly stable enough for sitting procedure. Discussed epidural, and patient consents to the procedure:  included risk of possible headache,backache, failed block, allergic reaction, and nerve injury. This patient was asked if she had any questions or concerns before the procedure started. )        Anesthesia Quick Evaluation  

## 2010-09-19 NOTE — Plan of Care (Signed)
Problem: Consults Goal: Birthing Suites Patient Information Press F2 to bring up selections list Outcome: Completed/Met Date Met:  09/19/10  Pt 37-[redacted] weeks EGA

## 2010-09-19 NOTE — Progress Notes (Signed)
Pt C/O uc's since last night, became more intense today @ 1200, denies bleeding or LOF.  States she has brown mucus discharge.

## 2010-09-19 NOTE — Progress Notes (Signed)
Dr. Gaynell Face notified of Pt presenting with ROM per Delice Bison, RN.  Orders received for pcn and labor admit orders.

## 2010-09-19 NOTE — Progress Notes (Signed)
Pt states contractions every 5 minutes with brown thick mucus discharge. Reports good fetal movement,

## 2010-09-19 NOTE — Anesthesia Procedure Notes (Addendum)
Epidural Patient location during procedure: OB Start time: 09/19/2010 11:06 PM  Staffing Anesthesiologist: Jiles Garter  Preanesthetic Checklist Completed: patient identified, site marked, surgical consent, pre-op evaluation, timeout performed, IV checked, risks and benefits discussed and monitors and equipment checked  Epidural Patient position: sitting Prep: site prepped and draped and DuraPrep Patient monitoring: continuous pulse ox and blood pressure Approach: midline Injection technique: LOR air  Needle:  Needle type: Tuohy  Needle gauge: 17 G Needle length: 9 cm Needle insertion depth: 5 cm cm Catheter type: closed end flexible Catheter size: 19 Gauge Catheter at skin depth: 10 cm Test dose: negative  Assessment Events: blood not aspirated, injection not painful, no injection resistance, negative IV test and no paresthesia  Additional Notes Dosing of Epidural: 1st dose, Through needle...... 5mg  Marcaine 2nd dose, through catheter.... epi 1:200K + Xylocaine 40 mg 3rd dose, through catheter...Marland KitchenMarland Kitchenepi 1:200K + Xylocaine 60 mg Each dose occurred after waiting 3 min,patient was free of IV sx; and patient exhibits no evidence of SA injection  Patient is more comfortable after epidural dosed. Please see RN's note for documentation of vital signs,and FHR which are stable.

## 2010-09-20 ENCOUNTER — Encounter (HOSPITAL_COMMUNITY): Payer: Self-pay | Admitting: Obstetrics

## 2010-09-20 LAB — RPR: RPR Ser Ql: NONREACTIVE

## 2010-09-20 MED ORDER — BENZOCAINE-MENTHOL 20-0.5 % EX AERO: 1.0000 "application " | INHALATION_SPRAY | CUTANEOUS | Status: DC | PRN

## 2010-09-20 MED ORDER — TETANUS-DIPHTH-ACELL PERTUSSIS 5-2.5-18.5 LF-MCG/0.5 IM SUSP: 0.5000 mL | Freq: Once | INTRAMUSCULAR | Status: DC

## 2010-09-20 MED ORDER — SODIUM CHLORIDE 0.9 % IJ SOLN: 3.0000 mL | INTRAMUSCULAR | Status: DC | PRN

## 2010-09-20 MED ORDER — WITCH HAZEL-GLYCERIN EX PADS: 1.0000 "application " | MEDICATED_PAD | CUTANEOUS | Status: DC | PRN

## 2010-09-20 MED ORDER — IBUPROFEN 600 MG PO TABS
600.0000 mg | ORAL_TABLET | Freq: Four times a day (QID) | ORAL | Status: DC
  Administered 2010-09-20 – 2010-09-22 (×8): 600 mg via ORAL
  Filled 2010-09-20 (×10): qty 1

## 2010-09-20 MED ORDER — PRENATAL PLUS 27-1 MG PO TABS
1.0000 | ORAL_TABLET | Freq: Every day | ORAL | Status: DC
  Administered 2010-09-21 – 2010-09-22 (×2): 1 via ORAL
  Filled 2010-09-20 (×3): qty 1

## 2010-09-20 MED ORDER — AMPICILLIN 500 MG PO CAPS
500.0000 mg | ORAL_CAPSULE | Freq: Four times a day (QID) | ORAL | Status: DC
  Administered 2010-09-20 – 2010-09-22 (×9): 500 mg via ORAL
  Filled 2010-09-20 (×9): qty 1

## 2010-09-20 MED ORDER — ONDANSETRON HCL 4 MG/2ML IJ SOLN: 4.0000 mg | INTRAMUSCULAR | Status: DC | PRN

## 2010-09-20 MED ORDER — FERROUS SULFATE 325 (65 FE) MG PO TABS
325.0000 mg | ORAL_TABLET | Freq: Two times a day (BID) | ORAL | Status: DC
  Administered 2010-09-20 – 2010-09-22 (×5): 325 mg via ORAL
  Filled 2010-09-20 (×6): qty 1

## 2010-09-20 MED ORDER — OXYTOCIN 20 UNITS IN LACTATED RINGERS INFUSION - SIMPLE: 125.0000 mL/h | INTRAVENOUS | Status: DC | PRN

## 2010-09-20 MED ORDER — SENNOSIDES-DOCUSATE SODIUM 8.6-50 MG PO TABS
2.0000 | ORAL_TABLET | Freq: Every day | ORAL | Status: DC
  Administered 2010-09-20: 2 via ORAL

## 2010-09-20 MED ORDER — ONDANSETRON HCL 4 MG PO TABS: 4.0000 mg | ORAL_TABLET | ORAL | Status: DC | PRN

## 2010-09-20 MED ORDER — SODIUM CHLORIDE 0.9 % IJ SOLN: 3.0000 mL | Freq: Two times a day (BID) | INTRAMUSCULAR | Status: DC

## 2010-09-20 MED ORDER — SODIUM CHLORIDE 0.9 % IV SOLN
2.0000 g | Freq: Once | INTRAVENOUS | Status: AC
  Administered 2010-09-20: 2 g via INTRAVENOUS
  Filled 2010-09-20: qty 2000

## 2010-09-20 MED ORDER — DIBUCAINE 1 % RE OINT: 1.0000 "application " | TOPICAL_OINTMENT | RECTAL | Status: DC | PRN

## 2010-09-20 MED ORDER — SIMETHICONE 80 MG PO CHEW: 80.0000 mg | CHEWABLE_TABLET | ORAL | Status: DC | PRN

## 2010-09-20 MED ORDER — SODIUM CHLORIDE 0.9 % IV SOLN: 250.0000 mL | INTRAVENOUS | Status: DC

## 2010-09-20 MED ORDER — LANOLIN HYDROUS EX OINT: TOPICAL_OINTMENT | CUTANEOUS | Status: DC | PRN

## 2010-09-20 MED ORDER — DIPHENHYDRAMINE HCL 25 MG PO CAPS: 25.0000 mg | ORAL_CAPSULE | Freq: Four times a day (QID) | ORAL | Status: DC | PRN

## 2010-09-20 MED ORDER — OXYCODONE-ACETAMINOPHEN 5-325 MG PO TABS
1.0000 | ORAL_TABLET | ORAL | Status: DC | PRN
Start: 2010-09-20 — End: 2010-09-22
  Administered 2010-09-20: 2 via ORAL
  Administered 2010-09-20: 1 via ORAL
  Administered 2010-09-20: 2 via ORAL
  Administered 2010-09-21 – 2010-09-22 (×4): 1 via ORAL
  Filled 2010-09-20: qty 1
  Filled 2010-09-20: qty 2
  Filled 2010-09-20 (×4): qty 1
  Filled 2010-09-20: qty 2
  Filled 2010-09-20 (×2): qty 1

## 2010-09-20 MED ORDER — ZOLPIDEM TARTRATE 5 MG PO TABS: 5.0000 mg | ORAL_TABLET | Freq: Every evening | ORAL | Status: DC | PRN

## 2010-09-20 NOTE — Progress Notes (Signed)
UR chart review completed.  

## 2010-09-21 LAB — CBC
Hemoglobin: 7.5 g/dL — ABNORMAL LOW (ref 12.0–15.0)
MCV: 66.4 fL — ABNORMAL LOW (ref 78.0–100.0)
Platelets: 283 10*3/uL (ref 150–400)
RBC: 3.69 MIL/uL — ABNORMAL LOW (ref 3.87–5.11)
WBC: 12.1 10*3/uL — ABNORMAL HIGH (ref 4.0–10.5)

## 2010-09-21 NOTE — Progress Notes (Signed)
  Postpartum day one Vital signs normal Fundus firm Legs negative No complaints 

## 2010-09-22 MED ORDER — FERROUS SULFATE 325 (65 FE) MG PO TABS: 325.0000 mg | ORAL_TABLET | Freq: Two times a day (BID) | ORAL | Status: DC

## 2010-09-22 MED ORDER — OXYCODONE-ACETAMINOPHEN 5-325 MG PO TABS: 2.0000 | ORAL_TABLET | Freq: Four times a day (QID) | ORAL | Status: AC | PRN

## 2010-09-22 NOTE — Anesthesia Postprocedure Evaluation (Signed)
  Anesthesia Post-op Note  Patient: Elizabeth Warren  This patient has recovered from her labor epidural, and I am not aware of any complications or problems.

## 2010-09-22 NOTE — Discharge Summary (Signed)
  Obstetric Discharge Summary Reason for Admission: onset of labor Prenatal Procedures: none Intrapartum Procedures: spontaneous vaginal delivery Postpartum Procedures: none Complications-Operative and Postpartum: none  Hemoglobin  Date Value Range Status  09/21/2010 7.5* 12.0-15.0 (g/dL) Final     DELTA CHECK NOTED     REPEATED TO VERIFY     HCT  Date Value Range Status  09/21/2010 24.5* 36.0-46.0 (%) Final    Discharge Diagnoses: Term Pregnancy-delivered  Discharge Information: Date: 09/22/2010 Activity: pelvic rest Diet: routine Medications: Ibuprophen Condition: stable Instructions: refer to routine discharge instructions Discharge to: home Follow-up Information    Follow up with MARSHALL,BERNARD A, MD. Call in 6 weeks.   Contact information:   [address] [place] [address] [phone]          Newborn Data: Live born  Information for the patient's newborn:  Kassidie, Hendriks [[id number]]  female ; APGAR , ; weight ;  Home with mother.  JACKSON-MOORE,Rogelio Waynick A 09/22/2010, 9:34 AM

## 2010-09-22 NOTE — Progress Notes (Signed)
  Post Partum Day #2 S/P:spontaneous vaginal  RH status/Rubella reviewed.  Feeding: breast Subjective: No HA, SOB, CP, F/C, breast symptoms: No. Normal vaginal bleeding, no clots.     Objective:  Blood pressure 94/66, pulse 98, temperature 98.1 F (36.7 C), temperature source Oral, resp. rate 18, height 5' (1.524 m), weight 78.926 kg (174 lb), SpO2 100.00%, unknown if currently breastfeeding.   Physical Exam:  General: alert Lochia: appropriate Uterine Fundus: firm DVT Evaluation: No evidence of DVT seen on physical exam. Ext: No c/c/e  Basename 09/21/10 0525 09/19/10 2205  HGB 7.5* 9.1*  HCT 24.5* 29.4*    Assessment/Plan: 30 y.o.  PPD # 2 .  normal postpartum exam Anemia stable, plans IUD Continue current postpartum care D/C home   LOS: 3 days   JACKSON-MOORE,Aerilyn Slee A 09/22/2010, 9:31 AM

## 2010-10-31 LAB — CBC
HCT: 25.8 — ABNORMAL LOW
HCT: 32.5 — ABNORMAL LOW
Hemoglobin: 10.4 — ABNORMAL LOW
MCHC: 32.5
MCV: 71.5 — ABNORMAL LOW
MCV: 71.8 — ABNORMAL LOW
RBC: 3.59 — ABNORMAL LOW
RDW: 29.7 — ABNORMAL HIGH
WBC: 12.7 — ABNORMAL HIGH

## 2011-03-21 ENCOUNTER — Encounter (HOSPITAL_COMMUNITY): Payer: Self-pay | Admitting: Emergency Medicine

## 2011-03-21 ENCOUNTER — Emergency Department (HOSPITAL_COMMUNITY)
Admission: EM | Admit: 2011-03-21 | Discharge: 2011-03-21 | Disposition: A | Discharge status: Home / Self Care | Payer: Self-pay | Source: Home / Self Care | Attending: Family Medicine | Admitting: Family Medicine

## 2011-03-21 DIAGNOSIS — R238 Other skin changes: Secondary | ICD-10-CM

## 2011-03-21 DIAGNOSIS — L988 Other specified disorders of the skin and subcutaneous tissue: Secondary | ICD-10-CM

## 2011-03-21 NOTE — Discharge Instructions (Signed)
The red bump on your breast does not require prescription medication, and will most likely go away without any treatment. You may apply an antibiotic ointment such as Neosporin once or twice a day. Return if you have increased redness, swelling or pain.

## 2011-03-21 NOTE — ED Provider Notes (Signed)
History     CSN: [id number]  Arrival date & time 03/21/11  1027   First MD Initiated Contact with Patient 03/21/11 1102      Chief Complaint  Patient presents with  . Rash    (Consider location/radiation/quality/duration/timing/severity/associated sxs/prior treatment) HPI Comments: Patient noticed this morning while showering a small red bump on her inferior right breast. This is nontender. She is concerned that this may be cancer from warnings that she has heard on TV shows. No other skin lesions.    Past Medical History  Diagnosis Date  . No pertinent past medical history   . NVD (normal vaginal delivery) 09/20/2010    Past Surgical History  Procedure Date  . No past surgeries     History reviewed. No pertinent family history.  History  Substance Use Topics  . Smoking status: Never Smoker   . Smokeless tobacco: Never Used  . Alcohol Use: No    OB History    Grav Para Term Preterm Abortions TAB SAB Ect Mult Living   4 4 4  0 0 0 0 0 0 4      Review of Systems  Constitutional: Negative for fever and chills.  Respiratory: Negative for cough and shortness of breath.   Cardiovascular: Negative for chest pain.    Allergies  Review of patient's allergies indicates no known allergies.  Home Medications   Current Outpatient Rx  Name Route Sig Dispense Refill  . FERROUS SULFATE 325 (65 FE) MG PO TABS Oral Take 1 tablet (325 mg total) by mouth 2 (two) times daily after a meal. 60 tablet 5  . MULTI-VITAMIN/MINERALS PO TABS Oral Take 1 tablet by mouth daily.        BP 112/71  Pulse 101  Temp(Src) 97.9 F (36.6 C) (Oral)  Resp 16  SpO2 100%  Breastfeeding? Unknown  Physical Exam  Nursing note and vitals reviewed. Constitutional: She appears well-developed and well-nourished. No distress.  HENT:  Head: Normocephalic and atraumatic.  Cardiovascular: Normal rate, regular rhythm and normal heart sounds.   Pulmonary/Chest: Effort normal and breath sounds  normal. No respiratory distress. Right breast exhibits no inverted nipple, no mass, no nipple discharge, no skin change and no tenderness. Left breast exhibits no inverted nipple, no mass, no nipple discharge, no skin change and no tenderness. Breasts are symmetrical.    Lymphadenopathy:    She has no axillary adenopathy.  Neurological: She is alert.  Skin: Skin is warm and dry.  Psychiatric: She has a normal mood and affect.    ED Course  Procedures (including critical care time)  Labs Reviewed - No data to display No results found.   1. Papule       MDM   Reassurance to pt superficial skin inflammation. Return if worsening symptoms.        Melody Comas, [place] 03/21/11 1116

## 2011-03-21 NOTE — ED Notes (Signed)
PT HERE WITH RED SMALL PIMPLE UNDER RIGHT BREAST THAT SHE NOTICED THIS AM AFTER SHOWERING.NO C/O PAIN OR ITCHING.PT WAS CONCERNED FOR CANCER

## 2011-03-30 NOTE — ED Provider Notes (Signed)
Medical screening examination/treatment/procedure(s) were performed by non-physician practitioner and as supervising physician I was immediately available for consultation/collaboration.  Insiya Oshea G  D.O.    Randa Spike, MD 03/30/11 1100

## 2011-04-04 ENCOUNTER — Emergency Department (INDEPENDENT_AMBULATORY_CARE_PROVIDER_SITE_OTHER)
Admission: EM | Admit: 2011-04-04 | Discharge: 2011-04-04 | Disposition: A | Discharge status: Home Health | Payer: Self-pay | Source: Home / Self Care | Attending: Family Medicine | Admitting: Family Medicine

## 2011-04-04 ENCOUNTER — Emergency Department (INDEPENDENT_AMBULATORY_CARE_PROVIDER_SITE_OTHER): Payer: Self-pay

## 2011-04-04 ENCOUNTER — Encounter (HOSPITAL_COMMUNITY): Payer: Self-pay | Admitting: Emergency Medicine

## 2011-04-04 DIAGNOSIS — M7750 Other enthesopathy of unspecified foot: Secondary | ICD-10-CM

## 2011-04-04 DIAGNOSIS — M7740 Metatarsalgia, unspecified foot: Secondary | ICD-10-CM

## 2011-04-04 MED ORDER — NAPROXEN 375 MG PO TABS: 375.0000 mg | ORAL_TABLET | Freq: Two times a day (BID) | ORAL | Status: DC

## 2011-04-04 MED ORDER — HYDROCODONE-ACETAMINOPHEN 5-325 MG PO TABS: ORAL_TABLET | ORAL | Status: AC

## 2011-04-04 NOTE — ED Provider Notes (Signed)
History     CSN: [id number]  Arrival date & time 04/04/11  0803   First MD Initiated Contact with Patient 04/04/11 [phone]      Chief Complaint  Patient presents with  . Foot Pain    (Consider location/radiation/quality/duration/timing/severity/associated sxs/prior treatment) HPI Comments: Aranza presents for evaluation of pain in her great toe of her right foot. She denies any injury. The reports onset of symptoms over the last week. She reports most of the pain is in the ball on the plantar surface of her foot beneath her great toe. She now states that the pain radiates to the arch of her foot. Pain is greatest with plantar flexion and dorsi flexion of her great toe. Pain is also increased with ambulation. She is taking ibuprofen 800 mg without relief. She denies any injury or new shoes.  Patient is a 31 y.o. female presenting with lower extremity pain.  Foot Pain This is a new problem. The current episode started more than 1 week ago. The problem occurs constantly. The problem has not changed since onset.The symptoms are aggravated by walking and standing. The symptoms are relieved by nothing.    Past Medical History  Diagnosis Date  . No pertinent past medical history   . NVD (normal vaginal delivery) 09/20/2010  . Breast feeding status of mother     breast feeds 60 month old    Past Surgical History  Procedure Date  . No past surgeries     No family history on file.  History  Substance Use Topics  . Smoking status: Never Smoker   . Smokeless tobacco: Never Used  . Alcohol Use: No    OB History    Grav Para Term Preterm Abortions TAB SAB Ect Mult Living   4 4 4  0 0 0 0 0 0 4      Review of Systems  Constitutional: Negative.   HENT: Negative.   Eyes: Negative.   Respiratory: Negative.   Cardiovascular: Negative.   Gastrointestinal: Negative.   Genitourinary: Negative.   Musculoskeletal: Positive for gait problem.       RIGHT great toe pain  Skin: Negative.     Neurological: Negative.     Allergies  Review of patient's allergies indicates no known allergies.  Home Medications   Current Outpatient Rx  Name Route Sig Dispense Refill  . IBUPROFEN 800 MG PO TABS Oral Take 800 mg by mouth every 8 (eight) hours as needed.    Marland Kitchen FERROUS SULFATE 325 (65 FE) MG PO TABS Oral Take 1 tablet (325 mg total) by mouth 2 (two) times daily after a meal. 60 tablet 5  . MULTI-VITAMIN/MINERALS PO TABS Oral Take 1 tablet by mouth daily.        BP 108/69  Pulse 88  Temp(Src) 98 F (36.7 C) (Oral)  Resp 16  SpO2 98%  Breastfeeding? Yes  Physical Exam  Nursing note and vitals reviewed. Constitutional: She is oriented to person, place, and time. She appears well-developed and well-nourished.  HENT:  Head: Normocephalic and atraumatic.  Eyes: EOM are normal.  Neck: Normal range of motion.  Pulmonary/Chest: Effort normal.  Musculoskeletal: Normal range of motion.       Feet:  Neurological: She is alert and oriented to person, place, and time.  Skin: Skin is warm and dry.  Psychiatric: Her behavior is normal.    ED Course  Procedures (including critical care time)  Labs Reviewed - No data to display Dg Foot Complete Right  04/04/2011  *RADIOLOGY REPORT*  Clinical Data: Medial right foot pain.  RIGHT FOOT COMPLETE - 3+ VIEW  Comparison: None.  Findings: The patient has minimal spurring on the head of the first metatarsal.  The remainder of the foot is normal.  IMPRESSION: Minimal spurring of the first metatarsal phalangeal joint.  Original Report Authenticated By: Gwynn Burly, M.D.     No diagnosis found.    MDM  Xray reviewed by radiologist and myself; minimal spurring of 1st metatarsal; hard-soled shoe provided and naproxen rx given with PRN hydrocodone        Richardo Priest, MD 04/04/11 [phone]

## 2011-04-04 NOTE — Discharge Instructions (Signed)
Take medications as directed. However use with caution and monitor your infant for symptoms, such as increased sleepiness or poor feeding. I am concerned about the use of any pain medication in your situation in that given the language barrier, and poor understanding of my instructions. However, after discussion with you and your husband, I am prescribing the medication, with EXPLICIT instructions that you monitor your infant for the above symptoms. While the medical literature indicates that these medications DO ENTER breast milk, and could POSSIBLY affect your infant, common practice is that these medications are used in breast-feeding women. STOP using the medications if you have any concerns regarding your health or the health of your infant. Please follow up with the foot doctor is listed on your discharge papers. They will need to evaluate, you for further management. Return to care, sooner should your symptoms not improve, or worsen in any way.

## 2011-04-04 NOTE — ED Notes (Signed)
C/o right foot pain.  C/o pain for one week, particularly in great toe and ball of foot.  Hurts worse with weight bearing.  Reports sharp, shooting pain.  No injury.  Woke up with pain.

## 2011-06-02 ENCOUNTER — Encounter (HOSPITAL_COMMUNITY): Payer: Self-pay

## 2011-06-02 ENCOUNTER — Emergency Department (HOSPITAL_COMMUNITY)
Admission: EM | Admit: 2011-06-02 | Discharge: 2011-06-02 | Disposition: A | Discharge status: Home / Self Care | Payer: Self-pay | Source: Home / Self Care | Attending: Family Medicine | Admitting: Family Medicine

## 2011-06-02 DIAGNOSIS — L905 Scar conditions and fibrosis of skin: Secondary | ICD-10-CM

## 2011-06-02 NOTE — Discharge Instructions (Signed)
Impression is that you have a scar of the skin in your right lower breast that has remained there from a prior injury or skin infection or from over stretching of the skin. This can change does not have any characteristic that resembles skin cancer or breast cancer pathology. If you have persistent worries about this mark in your skin I recommend that you either have it followup by your primary care provider or followup with a dermatologist (number provided above)

## 2011-06-02 NOTE — ED Notes (Signed)
1 week hx of bump under right breast.  no pain.  no itch.  no other bumps noted on body.

## 2011-06-03 NOTE — ED Provider Notes (Signed)
History     CSN: [id number]  Arrival date & time 06/02/11  1657   First MD Initiated Contact with Patient 06/02/11 1701      Chief Complaint  Patient presents with  . Rash    1 week hx of bump under right breast.  no pain.  no itch.  no other bumps noted on body.     (Consider location/radiation/quality/duration/timing/severity/associated sxs/prior treatment) HPI Comments: 31 y/o female 8 months post-partum currently breastfeeding no significant PMH. Here with a recurrent concern about a "bump" in the skin below her right breast. Was seen in Feb.14 here with same complaint, diagnosed with skin lesion. Sates she is worried it could be cancer. Unsure for how long this "bump" has been there and does not remember any trauma or infection that might have left this mark. Denies pain, burning or itchiness. No discharge. Denies bloody discharge from nipples. No lumps in breasts. No other skin changes.   Past Medical History  Diagnosis Date  . No pertinent past medical history   . NVD (normal vaginal delivery) 09/20/2010  . Breast feeding status of mother     breast feeds 68 month old    Past Surgical History  Procedure Date  . No past surgeries     History reviewed. No pertinent family history.  History  Substance Use Topics  . Smoking status: Never Smoker   . Smokeless tobacco: Never Used  . Alcohol Use: No    OB History    Grav Para Term Preterm Abortions TAB SAB Ect Mult Living   4 4 4  0 0 0 0 0 0 4      Review of Systems  Skin: Positive for rash.  All other systems reviewed and are negative.    Allergies  Review of patient's allergies indicates no known allergies.  Home Medications   Current Outpatient Rx  Name Route Sig Dispense Refill  . IBUPROFEN 800 MG PO TABS Oral Take 800 mg by mouth every 8 (eight) hours as needed.    . MULTI-VITAMIN/MINERALS PO TABS Oral Take 1 tablet by mouth daily.      Marland Kitchen FERROUS SULFATE 325 (65 FE) MG PO TABS Oral Take 1 tablet (325  mg total) by mouth 2 (two) times daily after a meal. 60 tablet 5  . NAPROXEN 375 MG PO TABS Oral Take 1 tablet (375 mg total) by mouth 2 (two) times daily. 20 tablet 0    BP 120/78  Pulse 102  Temp(Src) 98.5 F (36.9 C) (Oral)  Resp 20  SpO2 100%  Physical Exam  Nursing note and vitals reviewed. Constitutional: She is oriented to person, place, and time. She appears well-developed and well-nourished. No distress.  HENT:  Head: Normocephalic and atraumatic.  Eyes: No scleral icterus.  Neck: Neck supple.  Cardiovascular: Normal heart sounds.   Pulmonary/Chest: Breath sounds normal.  Lymphadenopathy:    She has no cervical adenopathy.  Neurological: She is alert and oriented to person, place, and time.  Skin:       There is a round soft superficial hyperpigmented skin papule about 6 mm diameter. No attached to deep planes. Soft to touch no nodular consistency appears as round soft scar.  Located in the skin of right breast inferior external quadrant.  Rest of breast exam normal: patient has large breasts, no lumps felt, no skin retractions or other changes, nipples with milk discharge as patient is breast feeding. No axillary adenopathies or supraclavicular adenopathies. Rest of body skin is clear.  ED Course  Procedures (including critical care time)  Labs Reviewed - No data to display No results found.   1. Scar of skin       MDM  My impression is that likely she had a hair follicle infected in past leaving a minimal scar that is superficial, soft and hyperpigmented other possibility is an accessory nipple but is located external and lateral to mammary line .  Reassured patient and gave her dermatology referral if persistent worries.        Sharin Grave, MD 06/03/11 1112

## 2011-10-24 ENCOUNTER — Ambulatory Visit: Payer: Self-pay | Admitting: Family Medicine

## 2011-10-31 ENCOUNTER — Ambulatory Visit (INDEPENDENT_AMBULATORY_CARE_PROVIDER_SITE_OTHER): Payer: Medicaid Other | Admitting: Family Medicine

## 2011-10-31 ENCOUNTER — Encounter: Payer: Self-pay | Admitting: Family Medicine

## 2011-10-31 VITALS — BP 108/76 | HR 98 | Temp 98.0°F | Ht 60.0 in | Wt 180.0 lb

## 2011-10-31 DIAGNOSIS — E669 Obesity, unspecified: Secondary | ICD-10-CM | POA: Insufficient documentation

## 2011-10-31 DIAGNOSIS — M549 Dorsalgia, unspecified: Secondary | ICD-10-CM | POA: Insufficient documentation

## 2011-10-31 MED ORDER — IBUPROFEN 800 MG PO TABS: 800.0000 mg | ORAL_TABLET | Freq: Three times a day (TID) | ORAL | Status: DC | PRN

## 2011-10-31 NOTE — Patient Instructions (Addendum)
Thank you for coming into clinic today It was a pleasure meeting you Please come back to see me if you continue to have back pain I will let you know if any of your labwork is abnormal

## 2011-10-31 NOTE — Assessment & Plan Note (Signed)
Musculoskeletal.  Wt loss Ibuprofen 800mg  PRN +/- mm relaxer at next appt

## 2011-11-01 ENCOUNTER — Encounter: Payer: Self-pay | Admitting: Family Medicine

## 2011-11-01 LAB — LDL CHOLESTEROL, DIRECT: Direct LDL: 120 mg/dL — ABNORMAL HIGH

## 2011-11-01 LAB — COMPREHENSIVE METABOLIC PANEL
ALT: 26 U/L (ref 0–35)
AST: 23 U/L (ref 0–37)
CO2: 29 mEq/L (ref 19–32)
Calcium: 9.8 mg/dL (ref 8.4–10.5)
Chloride: 100 mEq/L (ref 96–112)
Sodium: 137 mEq/L (ref 135–145)
Total Bilirubin: 0.2 mg/dL — ABNORMAL LOW (ref 0.3–1.2)
Total Protein: 7.5 g/dL (ref 6.0–8.3)

## 2011-11-01 NOTE — Assessment & Plan Note (Addendum)
Pt understands that this is likely contributing to her back pain and puts her at greater risk of other medical problems, especially dm. WIll try to lose wt by next appt.   Addnedum Direct LDL ordered and noted (120). At goal

## 2011-11-01 NOTE — Progress Notes (Addendum)
  Subjective:    Patient ID: Elizabeth Warren, female    DOB: May 04, 1980, 31 y.o.   MRN: [id number]  HPI CC: lower back pain:   Low back pain: started during last pregnancy and has persisted since last NVD 1 year ago. Relieved w/ 800mg  ibuprofen that pt takes approximately 1x wkly. No loss of bowel or bladder function, and not loss of sensation in gluteal, groin, or le bilat.   Obesity: Pt unsure if she is over wt. States she has not lost her wt from having children. Tries to eat healthily. Denies, n/v/d/c. Exercises little.   PMHx and social history and noted in chart  Review of Systems Per hpi    Objective:   Physical Exam Gen: NAD, obese Musc: firm perispinal muscles in the lumbar region. No focal tenderness         Assessment & Plan:

## 2011-11-07 ENCOUNTER — Encounter: Payer: Self-pay | Admitting: Family Medicine

## 2011-12-03 ENCOUNTER — Encounter: Payer: Self-pay | Admitting: Family Medicine

## 2011-12-03 ENCOUNTER — Ambulatory Visit: Payer: Medicaid Other | Admitting: Family Medicine

## 2011-12-03 ENCOUNTER — Other Ambulatory Visit (HOSPITAL_COMMUNITY)
Admission: RE | Admit: 2011-12-03 | Discharge: 2011-12-03 | Disposition: A | Discharge status: Home / Self Care | Payer: Medicaid Other | Source: Ambulatory Visit | Attending: Family Medicine | Admitting: Family Medicine

## 2011-12-03 ENCOUNTER — Ambulatory Visit (INDEPENDENT_AMBULATORY_CARE_PROVIDER_SITE_OTHER): Payer: Medicaid Other | Admitting: Family Medicine

## 2011-12-03 ENCOUNTER — Emergency Department (HOSPITAL_COMMUNITY)
Admission: EM | Admit: 2011-12-03 | Discharge: 2011-12-03 | Disposition: A | Discharge status: Home / Self Care | Payer: Medicaid Other | Source: Home / Self Care | Attending: Family Medicine | Admitting: Family Medicine

## 2011-12-03 VITALS — BP 117/84 | HR 109 | Temp 99.6°F | Ht 60.5 in | Wt 176.6 lb

## 2011-12-03 DIAGNOSIS — R102 Pelvic and perineal pain: Secondary | ICD-10-CM

## 2011-12-03 DIAGNOSIS — E669 Obesity, unspecified: Secondary | ICD-10-CM

## 2011-12-03 DIAGNOSIS — J029 Acute pharyngitis, unspecified: Secondary | ICD-10-CM

## 2011-12-03 DIAGNOSIS — Z113 Encounter for screening for infections with a predominantly sexual mode of transmission: Secondary | ICD-10-CM | POA: Insufficient documentation

## 2011-12-03 DIAGNOSIS — N949 Unspecified condition associated with female genital organs and menstrual cycle: Secondary | ICD-10-CM | POA: Insufficient documentation

## 2011-12-03 DIAGNOSIS — R109 Unspecified abdominal pain: Secondary | ICD-10-CM

## 2011-12-03 DIAGNOSIS — N9489 Other specified conditions associated with female genital organs and menstrual cycle: Secondary | ICD-10-CM

## 2011-12-03 DIAGNOSIS — M549 Dorsalgia, unspecified: Secondary | ICD-10-CM

## 2011-12-03 LAB — POCT WET PREP (WET MOUNT): Clue Cells Wet Prep Whiff POC: NEGATIVE

## 2011-12-03 LAB — POCT UA - MICROSCOPIC ONLY

## 2011-12-03 LAB — POCT URINALYSIS DIPSTICK
Bilirubin, UA: NEGATIVE
Nitrite, UA: NEGATIVE
Spec Grav, UA: 1.02
pH, UA: 7

## 2011-12-03 MED ORDER — PENICILLIN V POTASSIUM 500 MG PO TABS: 500.0000 mg | ORAL_TABLET | Freq: Three times a day (TID) | ORAL | Status: DC

## 2011-12-03 NOTE — Assessment & Plan Note (Signed)
Normal appearing vaginal anatomy

## 2011-12-03 NOTE — Addendum Note (Signed)
Addended by: [place], Jakylah Bassinger on: 12/03/2011 04:13 PM   Modules accepted: Orders

## 2011-12-03 NOTE — Assessment & Plan Note (Signed)
Likely musculoskeletal Pt to increase hydration at work and continue Ibuprofen PRN Will f/u w/ urinalysis, wet prep and GC/Chl

## 2011-12-03 NOTE — Assessment & Plan Note (Signed)
Wt down 3.5 lbs due to increased mobility since starting work.

## 2011-12-03 NOTE — Patient Instructions (Addendum)
Thank you for coming in today You have a bacterial infection that will need to be treated with antibiotics Please continue taking your ibuprofen for your cramps Please drink more water while at work I will let you know if any of your lab work comes back abnormal Your exam today was normal  Strep Throat Strep throat is an infection of the throat caused by a bacteria named Streptococcus pyogenes. Your caregiver may call the infection streptococcal "tonsillitis" or "pharyngitis" depending on whether there are signs of inflammation in the tonsils or back of the throat. Strep throat is most common in children from 68 to 44 years old during the cold months of the year, but it can occur in people of any age during any season. This infection is spread from person to person (contagious) through coughing, sneezing, or other close contact. SYMPTOMS   Fever or chills.  Painful, swollen, red tonsils or throat.  Pain or difficulty when swallowing.  White or yellow spots on the tonsils or throat.  Swollen, tender lymph nodes or "glands" of the neck or under the jaw.  Red rash all over the body (rare). DIAGNOSIS  Many different infections can cause the same symptoms. A test must be done to confirm the diagnosis so the right treatment can be given. A "rapid strep test" can help your caregiver make the diagnosis in a few minutes. If this test is not available, a light swab of the infected area can be used for a throat culture test. If a throat culture test is done, results are usually available in a day or two. TREATMENT  Strep throat is treated with antibiotic medicine. HOME CARE INSTRUCTIONS   Gargle with 1 tsp of salt in 1 cup of warm water, 3 to 4 times per day or as needed for comfort.  Family members who also have a sore throat or fever should be tested for strep throat and treated with antibiotics if they have the strep infection.  Make sure everyone in your household washes their hands  well.  Do not share food, drinking cups, or personal items that could cause the infection to spread to others.  You may need to eat a soft food diet until your sore throat gets better.  Drink enough water and fluids to keep your urine clear or pale yellow. This will help prevent dehydration.  Get plenty of rest.  Stay home from school, daycare, or work until you have been on antibiotics for 24 hours.  Only take over-the-counter or prescription medicines for pain, discomfort, or fever as directed by your caregiver.  If antibiotics are prescribed, take them as directed. Finish them even if you start to feel better. SEEK MEDICAL CARE IF:   The glands in your neck continue to enlarge.  You develop a rash, cough, or earache.  You cough up green, yellow-brown, or bloody sputum.  You have pain or discomfort not controlled by medicines.  Your problems seem to be getting worse rather than better. SEEK IMMEDIATE MEDICAL CARE IF:   You develop any new symptoms such as vomiting, severe headache, stiff or painful neck, chest pain, shortness of breath, or trouble swallowing.  You develop severe throat pain, drooling, or changes in your voice.  You develop swelling of the neck, or the skin on the neck becomes red and tender.  You have a fever.  You develop signs of dehydration, such as fatigue, dry mouth, and decreased urination.  You become increasingly sleepy, or you cannot wake up  completely. Document Released: 01/19/2000 Document Revised: 04/15/2011 Document Reviewed: 03/22/2010 [facility] Patient Information 2013 [place], [place].

## 2011-12-03 NOTE — Assessment & Plan Note (Signed)
Resolved

## 2011-12-03 NOTE — Addendum Note (Signed)
Addended by: [place], Yahye Siebert on: 12/03/2011 03:36 PM   Modules accepted: Orders

## 2011-12-03 NOTE — Progress Notes (Signed)
Patient ID: Elizabeth Warren, female   DOB: November 09, 1980, 31 y.o.   MRN: [id number]  Elizabeth Warren is a 31 y.o. female who presents to [facility] today for bumps in her vagina, sore throat, and pelvic/abdominal pain  Vaginal bumps: Husband noticed abnormal bumps in vagina last week. Concerned that they could be infection vs cancer. Denies any nightsweats, unintentional wt loss, vaginal discharge/bleeding, dyspneuria.   Sore throat: Started approximately 1 wk ago. Progressively worse. Difficult to swallow. Subjective fever and general malaise which has worsened. No alleviating or aggrevating factors. Son sick w/ similar symptoms. Denies cough, rhinorrhea, cervical lymphadenitis, CP, SOB, HA.    Pelvic/Abdominal pain: Occurs at random times while at work. Exacerbated w/ ambulation. Improves w/ ibuprofen 800mg  and rest. Denies. Dyspnurea, vaginal or urinary discharge, subjective fever, general malaise for last several days. Monogomaus relationship w/ husband an does not protect for intercourse.    Obesity: Pt has gone back to work and walks a lot. No change in dietary habits. Wt down 3.5lbs   Back pain: Resolved. Occasionally flares but treated effectively w/ ibuprofen    The following portions of the patient's history were reviewed and updated as appropriate: allergies, current medications, past medical history, family and social history, and problem list.  Patient is a nonsmoker.  Past Medical History  Diagnosis Date  . No pertinent past medical history   . NVD (normal vaginal delivery) 09/20/2010  . Breast feeding status of mother     breast feeds 49 month old    ROS as above, all other systems neg.  Medications reviewed. Current Outpatient Prescriptions  Medication Sig Dispense Refill  . ferrous sulfate (FERROUSUL) 325 (65 FE) MG tablet Take 1 tablet (325 mg total) by mouth 2 (two) times daily after a meal.  60 tablet  5  . ibuprofen (ADVIL,MOTRIN) 800 MG tablet Take 1 tablet (800 mg total) by  mouth every 8 (eight) hours as needed.  30 tablet  1  . Multiple Vitamins-Minerals (MULTIVITAMIN WITH MINERALS) tablet Take 1 tablet by mouth daily.        . penicillin v potassium (VEETID) 500 MG tablet Take 1 tablet (500 mg total) by mouth 3 (three) times daily.  30 tablet  0    Exam:  BP 117/84  Pulse 109  Temp 99.6 F (37.6 C) (Oral)  Ht 5' 0.5" (1.537 m)  Wt 176 lb 9.6 oz (80.105 kg)  BMI 33.92 kg/m2  Breastfeeding? Yes Gen: Well NAD HEENT: EOMI,  MMM Lungs: CTABL Nl WOB Heart: RRR no MRG Abd: NABS, NT, ND GU: Labia normal, vaginal orifice normal appearing w/ vaginal walls well rugated and moist and pink. NO discharge. Mirena strings present at cervical os.  Exts: Non edematous BL  LE, warm and well perfused.   No results found for this or any previous visit (from the past 72 hour(s)).

## 2011-12-03 NOTE — Assessment & Plan Note (Signed)
Symptoms consistent w/ Strep Throat.  PCN VK for 7 days

## 2011-12-18 ENCOUNTER — Encounter (HOSPITAL_COMMUNITY): Payer: Self-pay | Admitting: *Deleted

## 2011-12-18 ENCOUNTER — Inpatient Hospital Stay (HOSPITAL_COMMUNITY)
Admission: AD | Admit: 2011-12-18 | Discharge: 2011-12-18 | Disposition: A | Discharge status: Home / Self Care | Payer: Medicaid Other | Source: Ambulatory Visit | Attending: Obstetrics & Gynecology | Admitting: Obstetrics & Gynecology

## 2011-12-18 DIAGNOSIS — N760 Acute vaginitis: Secondary | ICD-10-CM | POA: Insufficient documentation

## 2011-12-18 DIAGNOSIS — N949 Unspecified condition associated with female genital organs and menstrual cycle: Secondary | ICD-10-CM | POA: Insufficient documentation

## 2011-12-18 DIAGNOSIS — N952 Postmenopausal atrophic vaginitis: Secondary | ICD-10-CM

## 2011-12-18 LAB — URINALYSIS, ROUTINE W REFLEX MICROSCOPIC
Glucose, UA: NEGATIVE mg/dL
Leukocytes, UA: NEGATIVE
Protein, ur: NEGATIVE mg/dL
Specific Gravity, Urine: >1.03 — ABNORMAL HIGH (ref 1.005–1.030)
pH: 6 (ref 5.0–8.0)

## 2011-12-18 LAB — URINE MICROSCOPIC-ADD ON

## 2011-12-18 LAB — POCT PREGNANCY, URINE: Preg Test, Ur: NEGATIVE

## 2011-12-18 NOTE — MAU Note (Signed)
Pt states here for bump inside vagina, went to Dr. Konrad Dolores who evaluated and told her was okay. Pt concerned she has lost 8lbs in one month with no diet changes. Bumps are non-tender, no itching or burning with voiding. Areas are red

## 2011-12-18 NOTE — MAU Note (Signed)
Pt presents for vaginal bumps.  Denies any pain or burning with bumps.

## 2011-12-18 NOTE — MAU Provider Note (Signed)
History     CSN: [id number]  Arrival date and time: 12/18/11 1754   First Provider Initiated Contact with Patient 12/18/11 1949      Chief Complaint  Patient presents with  . Bump inside vagina    HPI  Elizabeth Warren is a 31 y.o. [phone] who states that has "lumps" at the opening of her vagina. She states that the opening is smaller and her husband says it looks different. She states she has lost 7lbs in one month. She is currently breastfeeding her 50 month old. He still nurses frequently. Her menses have resumed and she states her last period was about 2 weeks ago. No itching, no discharge, not odor.  Past Medical History  Diagnosis Date  . No pertinent past medical history   . NVD (normal vaginal delivery) 09/20/2010  . Breast feeding status of mother     breast feeds 2 month old    Past Surgical History  Procedure Date  . No past surgeries     Family History  Problem Relation Age of Onset  . Diabetes Mother   . Hypertension Mother   . Diabetes Father   . Hypertension Father   . Diabetes Maternal Grandmother   . Hypertension Maternal Grandmother   . Diabetes Maternal Grandfather   . Hypertension Maternal Grandfather   . Diabetes Paternal Grandmother   . Hypertension Paternal Grandmother   . Diabetes Paternal Grandfather   . Hypertension Paternal Grandfather     History  Substance Use Topics  . Smoking status: Never Smoker   . Smokeless tobacco: Never Used  . Alcohol Use: No    Allergies: No Known Allergies  Prescriptions prior to admission  Medication Sig Dispense Refill  . ferrous sulfate (FERROUSUL) 325 (65 FE) MG tablet Take 1 tablet (325 mg total) by mouth 2 (two) times daily after a meal.  60 tablet  5  . ibuprofen (ADVIL,MOTRIN) 800 MG tablet Take 1 tablet (800 mg total) by mouth every 8 (eight) hours as needed.  30 tablet  1  . Multiple Vitamins-Minerals (MULTIVITAMIN WITH MINERALS) tablet Take 1 tablet by mouth daily.        . penicillin v  potassium (VEETID) 500 MG tablet Take 1 tablet (500 mg total) by mouth 3 (three) times daily.  30 tablet  0    Review of Systems  Constitutional: Negative for fever.  Gastrointestinal: Negative for nausea, vomiting, abdominal pain, diarrhea and constipation.  Genitourinary: Negative for dysuria, urgency and frequency.   Physical Exam   Blood pressure 116/77, pulse 101, temperature 98.2 F (36.8 C), temperature source Oral, resp. rate 16, height 5' 0.5" (1.537 m), weight 79.493 kg (175 lb 4 oz), last menstrual period 12/04/2011, not currently breastfeeding.  Physical Exam  Nursing note and vitals reviewed. Constitutional: She is oriented to person, place, and time. She appears well-developed and well-nourished.  Cardiovascular: Normal rate and regular rhythm.   Respiratory: Effort normal and breath sounds normal.  GI: Soft.  Genitourinary:        External: normal, slightly atrophic likely 2/2 breastfeeding. Episiotomy scar visible with irregular repair along introitus. Area of concern on upper portion of the external genitalia is the clitoris, which is normal in appearance.  Vagina: pink, well rugated  Neurological: She is alert and oriented to person, place, and time.  Skin: Skin is warm and dry.    MAU Course  Procedures   Assessment and Plan   1. Atrophic vaginitis   Likely 2/2 breastfeeding Reassured  patient it will get better when breastfeeding ends.  Tawnya Crook 12/18/2011, 7:52 PM

## 2011-12-18 NOTE — MAU Note (Signed)
Entered room to discharge patient but she had already left, after being instructed to wait until nurse returned with discharge paperwork.

## 2012-01-14 NOTE — Telephone Encounter (Signed)
This encounter was created in error - please disregard.

## 2012-01-19 ENCOUNTER — Inpatient Hospital Stay (HOSPITAL_COMMUNITY)
Admission: AD | Admit: 2012-01-19 | Discharge: 2012-01-19 | Discharge status: Against Medical Advice | Payer: Medicaid Other | Source: Ambulatory Visit | Attending: Obstetrics | Admitting: Obstetrics

## 2012-01-19 ENCOUNTER — Encounter (HOSPITAL_COMMUNITY): Payer: Self-pay | Admitting: *Deleted

## 2012-01-19 DIAGNOSIS — N946 Dysmenorrhea, unspecified: Secondary | ICD-10-CM | POA: Insufficient documentation

## 2012-01-19 DIAGNOSIS — R112 Nausea with vomiting, unspecified: Secondary | ICD-10-CM | POA: Insufficient documentation

## 2012-01-19 DIAGNOSIS — R109 Unspecified abdominal pain: Secondary | ICD-10-CM | POA: Insufficient documentation

## 2012-01-19 LAB — URINALYSIS, ROUTINE W REFLEX MICROSCOPIC
Bilirubin Urine: NEGATIVE
Glucose, UA: NEGATIVE mg/dL
Protein, ur: NEGATIVE mg/dL
Urobilinogen, UA: 0.2 mg/dL (ref 0.0–1.0)

## 2012-01-19 LAB — URINE MICROSCOPIC-ADD ON

## 2012-01-19 LAB — POCT PREGNANCY, URINE: Preg Test, Ur: NEGATIVE

## 2012-01-19 NOTE — MAU Note (Addendum)
Pt reports she has had n/v and cramping for past few days. Has marana in for 1 1/2 years.  Normaly has period every month did not have one last month

## 2012-01-19 NOTE — ED Notes (Signed)
Notified Pt she was not pregnant . Pt releived. Told her she needed to be seen by our NP. Pt stated OK but she needed to turn off the lights in her car and she would be right back. 30 min have gone by and pt is not back. Will d/c pt as left with out being seen.

## 2012-05-29 ENCOUNTER — Inpatient Hospital Stay (HOSPITAL_COMMUNITY)
Admission: AD | Admit: 2012-05-29 | Discharge: 2012-05-29 | Disposition: A | Discharge status: Home / Self Care | Payer: Medicaid Other | Source: Ambulatory Visit | Attending: Obstetrics & Gynecology | Admitting: Obstetrics & Gynecology

## 2012-05-29 ENCOUNTER — Encounter (HOSPITAL_COMMUNITY): Payer: Self-pay | Admitting: Advanced Practice Midwife

## 2012-05-29 DIAGNOSIS — N949 Unspecified condition associated with female genital organs and menstrual cycle: Secondary | ICD-10-CM | POA: Insufficient documentation

## 2012-05-29 DIAGNOSIS — M545 Low back pain, unspecified: Secondary | ICD-10-CM

## 2012-05-29 DIAGNOSIS — R109 Unspecified abdominal pain: Secondary | ICD-10-CM | POA: Insufficient documentation

## 2012-05-29 DIAGNOSIS — R102 Pelvic and perineal pain: Secondary | ICD-10-CM

## 2012-05-29 LAB — URINALYSIS, ROUTINE W REFLEX MICROSCOPIC
Glucose, UA: NEGATIVE mg/dL
Ketones, ur: NEGATIVE mg/dL
Leukocytes, UA: NEGATIVE
Nitrite: NEGATIVE
Protein, ur: NEGATIVE mg/dL
pH: 6 (ref 5.0–8.0)

## 2012-05-29 LAB — URINE MICROSCOPIC-ADD ON

## 2012-05-29 LAB — POCT PREGNANCY, URINE: Preg Test, Ur: NEGATIVE

## 2012-05-29 MED ORDER — IBUPROFEN 200 MG PO TABS: 800.0000 mg | ORAL_TABLET | Freq: Three times a day (TID) | ORAL | Status: DC | PRN

## 2012-05-29 NOTE — MAU Provider Note (Signed)
Attestation of Attending Supervision of Advanced Practitioner (CNM/NP): Evaluation and management procedures were performed by the Advanced Practitioner under my supervision and collaboration.  I have reviewed the Advanced Practitioner's note and chart, and I agree with the management and plan.  HARRAWAY-SMITH, Coleman Kalas 11:46 AM     

## 2012-05-29 NOTE — MAU Note (Signed)
Patient states she has a Mirena IUD but has not had a period for this month. Having abdominal cramping but no bleeding or discharge.

## 2012-05-29 NOTE — MAU Provider Note (Signed)
History     CSN: [id number]  Arrival date and time: 05/29/12 4098   None     Chief Complaint  Patient presents with  . Abdominal Cramping   HPI 32 y.o. [name] here with c/o menstrual type cramping yesterday, mild, improved with motrin, no cramping today. Also has some ongoing low back pain x a few months, has been evaluated by PCP - was told it was related to muscle strain, this also improves with motrin. Having some mild low back pain today, but has not taken any motrin for this. No vaginal bleeding or discharge. Also c/o some intermittent mild bilateral breast tenderness - no pain now, no mass or redness. States that she has had Mirena in place x 1 year, usually has a period, but sometimes skips a cycle. Patient's last menstrual period was 04/28/2012. No period yet this month.    Past Medical History  Diagnosis Date  . No pertinent past medical history   . NVD (normal vaginal delivery) 09/20/2010  . Breast feeding status of mother     breast feeds 69 month old    Past Surgical History  Procedure Laterality Date  . No past surgeries      Family History  Problem Relation Age of Onset  . Diabetes Mother   . Hypertension Mother   . Diabetes Father   . Hypertension Father   . Diabetes Maternal Grandmother   . Hypertension Maternal Grandmother   . Diabetes Maternal Grandfather   . Hypertension Maternal Grandfather   . Diabetes Paternal Grandmother   . Hypertension Paternal Grandmother   . Diabetes Paternal Grandfather   . Hypertension Paternal Grandfather     History  Substance Use Topics  . Smoking status: Never Smoker   . Smokeless tobacco: Never Used  . Alcohol Use: No    Allergies: No Known Allergies  Prescriptions prior to admission  Medication Sig Dispense Refill  . ibuprofen (ADVIL,MOTRIN) 200 MG tablet Take 200 mg by mouth every 6 (six) hours as needed for pain (back pain).      . Multiple Vitamins-Minerals (MULTIVITAMIN WITH MINERALS) tablet Take 1  tablet by mouth daily.          Review of Systems  Constitutional: Negative.   Respiratory: Negative.   Cardiovascular: Negative.   Gastrointestinal: Positive for abdominal pain. Negative for nausea, vomiting, diarrhea and constipation.  Genitourinary: Negative for dysuria, urgency, frequency, hematuria and flank pain.       Negative for vaginal bleeding, vaginal discharge, dyspareunia  Musculoskeletal: Positive for back pain.  Neurological: Negative.   Psychiatric/Behavioral: Negative.    Physical Exam   Height 4' 11.5" (1.511 m), weight 181 lb (82.101 kg).  Physical Exam  Nursing note and vitals reviewed. Constitutional: She is oriented to person, place, and time. She appears well-developed and well-nourished. No distress.  Obese   Respiratory: Right breast exhibits no mass, no nipple discharge, no skin change and no tenderness. Left breast exhibits no mass, no nipple discharge, no skin change and no tenderness. Breasts are symmetrical.  GI: Soft. There is no tenderness.  Genitourinary: Uterus is not enlarged and not tender. Cervix exhibits no motion tenderness, no discharge and no friability. Right adnexum displays no mass, no tenderness and no fullness. Left adnexum displays no mass, no tenderness and no fullness. No bleeding around the vagina. Vaginal discharge (clear mucous, no odor) found.  IUD strings protruding about 1 cm from cervical os  Musculoskeletal: Normal range of motion.  Neurological: She is alert and  oriented to person, place, and time.  Skin: Skin is warm and dry.  Psychiatric: She has a normal mood and affect.    MAU Course  Procedures Results for orders placed during the hospital encounter of 05/29/12 (from the past 24 hour(s))  URINALYSIS, ROUTINE W REFLEX MICROSCOPIC     Status: Abnormal   Collection Time    05/29/12  8:20 AM      Result Value Range   Color, Urine YELLOW  YELLOW   APPearance CLEAR  CLEAR   Specific Gravity, Urine 1.010  1.005 - 1.030    pH 6.0  5.0 - 8.0   Glucose, UA NEGATIVE  NEGATIVE mg/dL   Hgb urine dipstick TRACE (*) NEGATIVE   Bilirubin Urine NEGATIVE  NEGATIVE   Ketones, ur NEGATIVE  NEGATIVE mg/dL   Protein, ur NEGATIVE  NEGATIVE mg/dL   Urobilinogen, UA 0.2  0.0 - 1.0 mg/dL   Nitrite NEGATIVE  NEGATIVE   Leukocytes, UA NEGATIVE  NEGATIVE  URINE MICROSCOPIC-ADD ON     Status: None   Collection Time    05/29/12  8:20 AM      Result Value Range   Squamous Epithelial / LPF RARE  RARE   WBC, UA 0-2  <3 WBC/hpf  POCT PREGNANCY, URINE     Status: None   Collection Time    05/29/12  8:28 AM      Result Value Range   Preg Test, Ur NEGATIVE  NEGATIVE     Assessment and Plan   1. Low back pain   2. Pelvic cramping   Low back pain appears to be chronic musculoskeletal issue, resolves with Motrin, gave info on back exercises, f/u with PCP if this worsens.  Pelvic pain has resolved. IUD appears to be in place, no pain or bleeding on exam today. Breast exam also normal.  Discussed with patient that these may be premenstrual type symptoms that she may experience with IUD, even if her period doesn't actually come. F/U with worsening pain that is consistent or vaginal bleeding/discharge.     Medication List    TAKE these medications       ibuprofen 200 MG tablet  Commonly known as:  ADVIL,MOTRIN  Take 4 tablets (800 mg total) by mouth every 8 (eight) hours as needed for pain (back pain).     multivitamin with minerals tablet  Take 1 tablet by mouth daily.            Follow-up Information   Follow up with [facility] [place]. (As needed)    Contact information:   [address] [place] [address] [phone]        Elizabeth Warren 05/29/2012, 8:34 AM

## 2012-11-19 ENCOUNTER — Inpatient Hospital Stay (HOSPITAL_COMMUNITY)
Admission: AD | Admit: 2012-11-19 | Discharge: 2012-11-19 | Disposition: A | Discharge status: Home / Self Care | Payer: Medicaid Other | Source: Ambulatory Visit | Attending: Obstetrics & Gynecology | Admitting: Obstetrics & Gynecology

## 2012-11-19 DIAGNOSIS — M549 Dorsalgia, unspecified: Secondary | ICD-10-CM | POA: Insufficient documentation

## 2012-11-19 DIAGNOSIS — R109 Unspecified abdominal pain: Secondary | ICD-10-CM | POA: Insufficient documentation

## 2012-11-19 NOTE — MAU Note (Signed)
Had come in because she was having some abdominal cramping this AM; thinks it's time for her period to start; took some Ibuprofen while she was waiting to be triaged and denies any pain now  And does not wish to be seen now; denies any chance of being pregnant; smiling and talking without any trace of pain;

## 2013-01-29 ENCOUNTER — Inpatient Hospital Stay (HOSPITAL_COMMUNITY)
Admission: AD | Admit: 2013-01-29 | Discharge: 2013-01-29 | Discharge status: Against Medical Advice | Payer: Medicaid Other | Source: Ambulatory Visit | Attending: Obstetrics | Admitting: Obstetrics

## 2013-01-29 ENCOUNTER — Encounter (HOSPITAL_COMMUNITY): Payer: Self-pay | Admitting: *Deleted

## 2013-01-29 DIAGNOSIS — N912 Amenorrhea, unspecified: Secondary | ICD-10-CM | POA: Insufficient documentation

## 2013-01-29 DIAGNOSIS — M549 Dorsalgia, unspecified: Secondary | ICD-10-CM | POA: Insufficient documentation

## 2013-01-29 DIAGNOSIS — R109 Unspecified abdominal pain: Secondary | ICD-10-CM | POA: Insufficient documentation

## 2013-01-29 DIAGNOSIS — N949 Unspecified condition associated with female genital organs and menstrual cycle: Secondary | ICD-10-CM | POA: Insufficient documentation

## 2013-01-29 LAB — URINALYSIS, ROUTINE W REFLEX MICROSCOPIC
Bilirubin Urine: NEGATIVE
Glucose, UA: NEGATIVE mg/dL
Ketones, ur: NEGATIVE mg/dL
Leukocytes, UA: NEGATIVE
pH: 5.5 (ref 5.0–8.0)

## 2013-01-29 LAB — URINE MICROSCOPIC-ADD ON

## 2013-01-29 NOTE — MAU Note (Signed)
Patient states she has a Mirena for 1 1/2 years and usually has a period. Has not had a period in 2 months. Has been having abdominal and back pain since yesterday.

## 2013-03-15 ENCOUNTER — Encounter (HOSPITAL_COMMUNITY): Payer: Self-pay | Admitting: *Deleted

## 2013-03-15 ENCOUNTER — Inpatient Hospital Stay (HOSPITAL_COMMUNITY)
Admission: AD | Admit: 2013-03-15 | Discharge: 2013-03-15 | Disposition: A | Discharge status: Home / Self Care | Payer: Medicaid Other | Source: Ambulatory Visit | Attending: Obstetrics | Admitting: Obstetrics

## 2013-03-15 DIAGNOSIS — R300 Dysuria: Secondary | ICD-10-CM | POA: Insufficient documentation

## 2013-03-15 DIAGNOSIS — R350 Frequency of micturition: Secondary | ICD-10-CM

## 2013-03-15 DIAGNOSIS — N949 Unspecified condition associated with female genital organs and menstrual cycle: Secondary | ICD-10-CM | POA: Insufficient documentation

## 2013-03-15 LAB — URINALYSIS, ROUTINE W REFLEX MICROSCOPIC
BILIRUBIN URINE: NEGATIVE
GLUCOSE, UA: NEGATIVE mg/dL
Ketones, ur: NEGATIVE mg/dL
Leukocytes, UA: NEGATIVE
Nitrite: NEGATIVE
PROTEIN: NEGATIVE mg/dL
SPECIFIC GRAVITY, URINE: 1.01 (ref 1.005–1.030)
UROBILINOGEN UA: 0.2 mg/dL (ref 0.0–1.0)
pH: 6 (ref 5.0–8.0)

## 2013-03-15 LAB — URINE MICROSCOPIC-ADD ON

## 2013-03-15 LAB — POCT PREGNANCY, URINE: PREG TEST UR: NEGATIVE

## 2013-03-15 NOTE — MAU Provider Note (Signed)
CC: Dysuria, Vaginal Pain and Pelvic Pain    First Provider Initiated Contact with Patient 03/15/13 1614      HPI Elizabeth Warren is a 33 y.o. [id number] who presents with onset 3 days ago of sharp suprapubic abdominal noted with or without urination. Has urinary frequency ( voided "20 times yesterday"), no urgency, sometimes dysuria. Drinks lots of ceffeinated tea. Denies vaginal discharge or irritation and declines STI testing. Wants Mirena checked (was placed 2 years ago). Amenorrheic x 2 months but before that had monthly cycles. Concerned that she began vaginal bleeding yesterday.    Past Medical History  Diagnosis Date  . No pertinent past medical history   . NVD (normal vaginal delivery) 09/20/2010  . Breast feeding status of mother     breast feeds 236 month old  . Medical history non-contributory     OB History  Gravida Para Term Preterm AB SAB TAB Ectopic Multiple Living  4 4 4  0 0 0 0 0 0 4    # Outcome Date GA Lbr Len/2nd Weight Sex Delivery Anes PTL Lv  4 TRM 09/20/10 [id number] 15:45 / 00:19 8 lb 7.8 oz (3.85 kg) F SVD EPI  Y  3 TRM           2 TRM           1 TRM               Past Surgical History  Procedure Laterality Date  . No past surgeries      History   Social History  . Marital Status: Married    Spouse Name: N/A    Number of Children: N/A  . Years of Education: N/A   Occupational History  . Not on file.   Social History Main Topics  . Smoking status: Never Smoker   . Smokeless tobacco: Never Used  . Alcohol Use: No  . Drug Use: No  . Sexual Activity: Yes    Birth Control/ Protection: None, IUD   Other Topics Concern  . Not on file   Social History Narrative  . No narrative on file    No current facility-administered medications on file prior to encounter.   Current Outpatient Prescriptions on File Prior to Encounter  Medication Sig Dispense Refill  . ibuprofen (ADVIL,MOTRIN) 200 MG tablet Take 4 tablets (800 mg total) by mouth every 8  (eight) hours as needed for pain (back pain).  60 tablet  2  . Multiple Vitamins-Minerals (MULTIVITAMIN WITH MINERALS) tablet Take 1 tablet by mouth daily.          No Known Allergies  ROS Pertinent items in HPI  PHYSICAL EXAM Filed Vitals:   03/15/13 1539  BP: 113/80  Pulse: 98  Temp: 98.6 F (37 C)  Resp: 20   General: Well nourished, well developed female in no acute distress Cardiovascular: Normal rate Respiratory: Normal effort Abdomen: Soft, completely nontender  Back: No CVAT Extremities: No edema Neurologic: Alert and oriented Speculum exam: NEFG; vagina with scant mucusy  blood; cervix clean and Mirena strings in place Bimanual exam: cervix closed, no CMT; uterus NSSP; no adnexal tenderness or masses   LAB RESULTS Results for orders placed during the hospital encounter of 03/15/13 (from the past 24 hour(s))  URINALYSIS, ROUTINE W REFLEX MICROSCOPIC     Status: Abnormal   Collection Time    03/15/13  3:52 PM      Result Value Range   Color, Urine YELLOW  YELLOW  APPearance CLEAR  CLEAR   Specific Gravity, Urine 1.010  1.005 - 1.030   pH 6.0  5.0 - 8.0   Glucose, UA NEGATIVE  NEGATIVE mg/dL   Hgb urine dipstick LARGE (*) NEGATIVE   Bilirubin Urine NEGATIVE  NEGATIVE   Ketones, ur NEGATIVE  NEGATIVE mg/dL   Protein, ur NEGATIVE  NEGATIVE mg/dL   Urobilinogen, UA 0.2  0.0 - 1.0 mg/dL   Nitrite NEGATIVE  NEGATIVE   Leukocytes, UA NEGATIVE  NEGATIVE  URINE MICROSCOPIC-ADD ON     Status: Abnormal   Collection Time    03/15/13  3:52 PM      Result Value Range   Squamous Epithelial / LPF FEW (*) RARE   WBC, UA 0-2  <3 WBC/hpf   RBC / HPF 11-20  <3 RBC/hpf   Bacteria, UA RARE  RARE    IMAGING No results found.  MAU COURSE   ASSESSMENT  1. Increased urinary frequency     PLAN Discharge home with reassurance that UA is normal. Cut out caffeinated beverages. See AVS for patient education.    Medication List         acetaminophen 500 MG tablet   Commonly known as:  TYLENOL  Take 500 mg by mouth every 6 (six) hours as needed for moderate pain.     ibuprofen 200 MG tablet  Commonly known as:  ADVIL,MOTRIN  Take 4 tablets (800 mg total) by mouth every 8 (eight) hours as needed for pain (back pain).     multivitamin with minerals tablet  Take 1 tablet by mouth daily.       Follow-up Information   Schedule an appointment as soon as possible for a visit with Kathreen Cosier, MD. (If symptoms worsen)    Specialty:  Obstetrics and Gynecology   Contact information:   [address] [place] [address] [phone]         Danae Orleans, CNM 03/15/2013 4:22 PM

## 2013-03-15 NOTE — MAU Note (Signed)
Yesterday went to bathroom, like 20 times- only goes a small amt and is painful.  ? Blood in urine or vag. Not time for period.

## 2013-03-15 NOTE — Discharge Instructions (Signed)
Urinary Frequency °The number of times a normal person urinates depends upon how much liquid they take in and how much liquid they are losing. If the temperature is hot and there is high humidity then the person will sweat more and usually breathe a little more frequently. These factors decrease the amount of frequency of urination that would be considered normal. °The amount you drink is easily determined, but the amount of fluid lost is sometimes more difficult to calculate.  °Fluid is lost in two ways: °· Sensible fluid loss is usually measured by the amount of urine that you get rid of. Losses of fluid can also occur with diarrhea. °· Insensible fluid loss is more difficult to measure. It is caused by evaporation. Insensible loss of fluid occurs through breathing and sweating. It usually ranges from a little less than a quart to a little more than a quart of fluid a day. °In normal temperatures and activity levels the average person may urinate 4 to 7 times in a 24-hour period. Needing to urinate more often than that could indicate a problem. If one urinates 4 to 7 times in 24 hours and has large volumes each time, that could indicate a different problem from one who urinates 4 to 7 times a day and has small volumes. The time of urinating is also an important. Most urinating should be done during the waking hours. Getting up at night to urinate frequently can indicate some problems. °CAUSES  °The bladder is the organ in your lower abdomen that holds urine. Like a balloon, it swells some as it fills up. Your nerves sense this and tell you it is time to head for the bathroom. There are a number of reasons that you might feel the need to urinate more often than usual. They include: °· Urinary tract infection. This is usually associated with other signs such as burning when you urinate. °· In men, problems with the prostate (a walnut-size gland that is located near the tube that carries urine out of your body).  There are two reasons why the prostate can cause an increased frequency of urination: °· An enlarged prostate that does not let the bladder empty well. If the bladder only half empties when you urinate then it only has half the capacity to fill before you have to urinate again. °· The nerves in the bladder become more hypersensitive with an increased size of the prostate even if the bladder empties completely. °· Pregnancy. °· Obesity. Excess weight is more likely to cause a problem for women more than for men. °· Bladder stones or other bladder problems. °· Caffeine. °· Alcohol. °· Medications. For example, drugs that help the body get rid of extra fluid (diuretics) increase urine production. Some other medicines must be taken with lots of fluids. °· Muscle or nerve weakness. This might be the result of a spinal cord injury, a stroke, multiple sclerosis or Parkinson's disease. °· Long-standing diabetes can decrease the sensation of the bladder. This loss of sensation makes it harder to sense the bladder needs to be emptied. Over a period of years the bladder is stretched out by constant overfilling. This weakens the bladder muscles so that the bladder does not empty well and has less capacity to fill with new urine. °· Interstitial cystitis (also called painful bladder syndrome). This condition develops because the tissues that line the insider of the bladder are inflamed (inflammation is the body's way of reacting to injury or infection). It causes pain   and frequent urination. It occurs in women more often than in men. °DIAGNOSIS  °· To decide what might be causing your urinary frequency, your healthcare provider will probably: °· Ask about symptoms you have noticed. °· Ask about your overall health. This will include questions about any medications you are taking. °· Do a physical examination. °· Order some tests. These might include: °· A blood test to check for diabetes or other health issues that could be  contributing to the problem. °· Urine testing. This could measure the flow of urine and the pressure on the bladder. °· A test of your neurological system (the brain, spinal cord and nerves). This is the system that senses the need to urinate. °· A bladder test to check whether it is emptying completely when you urinate. °· Cytoscopy. This test uses a thin tube with a tiny camera on it. It offers a look inside your urethra and bladder to see if there are problems. °· Imaging tests. You might be given a contrast dye and then asked to urinate. X-rays are taken to see how your bladder is working. °TREATMENT  °It is important for you to be evaluated to determine if the amount or frequency that you have is unusual or abnormal. If it is found to be abnormal the cause should be determined and this can usually be found out easily. Depending upon the cause treatment could include medication, stimulation of the nerves, or surgery. °There are not too many things that you can do as an individual to change your urinary frequency. It is important that you balance the amount of fluid intake needed to compensate for your activity and the temperature. Medical problems will be diagnosed and taken care of by your physician. There is no particular bladder training such as Kegel's exercises that you can do to help urinary frequency. This is an exercise this is usually done for people who have leaking of urine when they laugh cough or sneeze. °HOME CARE INSTRUCTIONS  °· Take any medications your healthcare provider prescribed or suggested. Follow the directions carefully. °· Practice any lifestyle changes that are recommended. These might include: °· Drinking less fluid or drinking at different times of the day. If you need to urinate often during the night, for example, you may need to stop drinking fluids early in the evening. °· Cutting down on caffeine or alcohol. They both can make you need to urinate more often than normal.  Caffeine is found in coffee, tea and sodas. °· Losing weight, if that is recommended. °· Keep a journal or a log. You might be asked to record how much you drink and when and when you feel the need to urinate. This will also help evaluate how well the treatment provided by your physician is working. °SEEK MEDICAL CARE IF:  °· Your need to urinate often gets worse. °· You feel increased pain or irritation when you urinate. °· You notice blood in your urine. °· You have questions about any medications that your healthcare provider recommended. °· You notice blood, pus or swelling at the site of any test or treatment procedure. °· You develop a fever of more than 100.5° F (38.1° C). °SEEK IMMEDIATE MEDICAL CARE IF:  °You develop a fever of more than 102.0° F (38.9° C). °Document Released: 11/17/2008 Document Revised: 04/15/2011 Document Reviewed: 11/17/2008 °[employer]® Patient Information ©2014 [employer]. ° °

## 2013-03-17 ENCOUNTER — Encounter (HOSPITAL_COMMUNITY): Payer: Self-pay | Admitting: *Deleted

## 2013-03-17 ENCOUNTER — Inpatient Hospital Stay (HOSPITAL_COMMUNITY)
Admission: AD | Admit: 2013-03-17 | Discharge: 2013-03-17 | Disposition: A | Discharge status: Home / Self Care | Payer: Medicaid Other | Source: Ambulatory Visit | Attending: Obstetrics & Gynecology | Admitting: Obstetrics & Gynecology

## 2013-03-17 DIAGNOSIS — N3289 Other specified disorders of bladder: Secondary | ICD-10-CM | POA: Insufficient documentation

## 2013-03-17 DIAGNOSIS — R109 Unspecified abdominal pain: Secondary | ICD-10-CM | POA: Insufficient documentation

## 2013-03-17 DIAGNOSIS — N3000 Acute cystitis without hematuria: Secondary | ICD-10-CM | POA: Insufficient documentation

## 2013-03-17 DIAGNOSIS — R350 Frequency of micturition: Secondary | ICD-10-CM | POA: Insufficient documentation

## 2013-03-17 DIAGNOSIS — R301 Vesical tenesmus: Secondary | ICD-10-CM

## 2013-03-17 LAB — URINALYSIS, ROUTINE W REFLEX MICROSCOPIC
BILIRUBIN URINE: NEGATIVE
Glucose, UA: NEGATIVE mg/dL
KETONES UR: NEGATIVE mg/dL
LEUKOCYTES UA: NEGATIVE
NITRITE: NEGATIVE
PH: 6.5 (ref 5.0–8.0)
Protein, ur: NEGATIVE mg/dL
Specific Gravity, Urine: 1.02 (ref 1.005–1.030)
UROBILINOGEN UA: 0.2 mg/dL (ref 0.0–1.0)

## 2013-03-17 LAB — URINE MICROSCOPIC-ADD ON

## 2013-03-17 MED ORDER — OXYCODONE-ACETAMINOPHEN 5-325 MG PO TABS: 1.0000 | ORAL_TABLET | Freq: Four times a day (QID) | ORAL | Status: DC | PRN

## 2013-03-17 MED ORDER — PHENAZOPYRIDINE HCL 200 MG PO TABS: 200.0000 mg | ORAL_TABLET | Freq: Three times a day (TID) | ORAL | Status: DC | PRN

## 2013-03-17 MED ORDER — OXYCODONE-ACETAMINOPHEN 5-325 MG PO TABS: 2.0000 | ORAL_TABLET | ORAL | Status: DC

## 2013-03-17 NOTE — MAU Provider Note (Signed)
Attestation of Attending Supervision of Advanced Practitioner (CNM/NP): Evaluation and management procedures were performed by the Advanced Practitioner under my supervision and collaboration.  I have reviewed the Advanced Practitioner's note and chart, and I agree with the management and plan.  HARRAWAY-SMITH, Dawnmarie Breon 7:21 PM

## 2013-03-17 NOTE — MAU Note (Signed)
C/o increased pain and increased in UTI symptoms; c/o cramping for past 3 days; has been taking Ibuprofen and Tylenol with little relief- has not been sleeping due to cramping;

## 2013-03-17 NOTE — Discharge Instructions (Signed)
Overactive Bladder, Adult  The bladder has two functions that are totally opposite of the other. One is to relax and stretch out so it can store urine (fills like a balloon), and the other is to contract and squeeze down so that it can empty the urine that it has stored. Proper functioning of the bladder is a complex mixing of these two functions. The filling and emptying of the bladder can be influenced by:  · The bladder.  · The spinal cord.  · The brain.  · The nerves going to the bladder.  · Other organs that are closely related to the bladder such as prostate in males and the vagina in females.  As your bladder fills with urine, nerve signals are sent from the bladder to the brain to tell you that you may need to urinate. Normal urination requires that the bladder squeeze down with sufficient strength to empty the bladder, but this also requires that the bladder squeeze down sufficiently long to finish the job. In addition the sphincter muscles, which normally keep you from leaking urine, must also relax so that the urine can pass. Coordination between the bladder muscle squeezing down and the sphincter muscles relaxing is required to make everything happen normally.  With an overactive bladder sometimes the muscles of the bladder contract unexpectedly and involuntarily and this causes an urgent need to urinate. The normal response is to try to hold urine in by contracting the sphincter muscles. Sometimes the bladder contracts so strongly that the sphincter muscles cannot stop the urine from passing out and incontinence occurs. This kind of incontinence is called urge incontinence.  Having an overactive bladder can be embarrassing and awkward. It can keep you from living life the way you want to. Many people think it is just something you have to put up with as you grow older or have certain health conditions. In fact, there are treatments that can help make your life easier and more pleasant.  CAUSES   Many  things can cause an overactive bladder. Possibilities include:  · Urinary tract infection or infection of nearby tissues such as the prostate.  · Prostate enlargement.  · In women, multiple pregnancies or surgery on the uterus or urethra.  · Bladder stones, inflammation or tumors.  · Caffeine.  · Alcohol.  · Medications. For example, diuretics (drugs that help the body get rid of extra fluid) increase urine production. Some other medicines must be taken with lots of fluids.  · Muscle or nerve weakness. This might be the result of a spinal cord injury, a stroke, multiple sclerosis or Parkinson's disease.  · Diabetes can cause a high urine volume which fills the bladder so quickly that the normal urge to urinate is triggered very strongly.  SYMPTOMS   · Loss of bladder control. You feel the need to urinate and cannot make your body wait.  · Sudden, strong urges to urinate.  · Urinating 8 or more times a day.  · Waking up to urinate two or more times a night.  DIAGNOSIS   To decide if you have overactive bladder, your healthcare provider will probably:  · Ask about symptoms you have noticed.  · Ask about your overall health. This will include questions about any medications you are taking.  · Do a physical examination. This will help determine if there are obvious blockages or other problems.  · Order some tests. These might include:  · A blood test to check for diabetes or   other health issues that could be contributing to the problem.  · Urine testing. This could measure the flow of urine and the pressure on the bladder.  · A test of your neurological system (the brain, spinal cord and nerves). This is the system that senses the need to urinate. Some of these tests are called flow tests, bladder pressure tests and electrical measurements of the sphincter muscle.  · A bladder test to check whether it is emptying completely when you urinate.  · Cytoscopy. This test uses a thin tube with a tiny camera on it. It offers a  look inside your urethra and bladder to see if there are problems.  · Imaging tests. You might be given a contrast dye and then asked to urinate. X-rays are taken to see how your bladder is working.  TREATMENT   An overactive bladder can be treated in many ways. The treatment will depend on the cause. Whether you have a mild or severe case also makes a difference. Often, treatment can be given in your healthcare provider's office or clinic. Be sure to discuss the different options with your caregiver. They include:  · Behavioral treatments. These do not involve medication or surgery:  · Bladder training. For this, you would follow a schedule to urinate at regular intervals. This helps you learn to control the urge to urinate. At first, you might be asked to wait a few minutes after feeling the urge. In time, you should be able to schedule bathroom visits an hour or more apart.  · Kegel exercises. These exercises strengthen the pelvic floor muscles, which support the bladder. By toning these muscles, they can help control urination, even if the bladder muscles are overactive. A specialist will teach you how to do these exercises correctly. They will require daily practice.  · Weight loss. If you are obese or overweight, losing weight might stop your bladder from being overactive. Talk to your healthcare provider about how many pounds you should lose. Also ask if there is a specific program or method that would work best for you.  · Diet change. This might be suggested if constipation is making your overactive bladder worse. Your healthcare provider or a nutritionist can explain ways to change what you eat to ease constipation. Other people might need to take in less caffeine or alcohol. Sometimes drinking fewer fluids is needed, too.  · Protection. This is not an actual treatment. But, you could wear special pads to take care of any leakage while you wait for other treatments to take effect. This will help you avoid  embarrassment.  · Physical treatments.  · Electrical stimulation. Electrodes will send gentle pulses to the nerves or muscles that help control the bladder. The goal is to strengthen them. Sometimes this is done with the electrodes outside of the body. Or, they might be placed inside the body (implanted). This treatment can take several months to have an effect.  · Medications. These are usually used along with other treatments. Several medicines are available. Some are injected into the muscles involved in urination. Others come in pill form. Medications sometimes prescribed include:  · Anticholinergics. These drugs block the signals that the nerves deliver to the bladder. This keeps it from releasing urine at the wrong time. Researchers think the drugs might help in other ways, too.  · Imipramine. This is an antidepressant. But, it relaxes bladder muscles.  · Botox. This is still experimental. Some people believe that injecting it into the   bladder muscles will relax them so they work more normally. It has also been injected into the sphincter muscle when the sphincter muscle does not open properly. This is a temporary fix, however. Also, it might make matters worse, especially in older people.  · Surgery.  · A device might be implanted to help manage your nerves. It works on the nerves that signal when you need to urinate.  · Surgery is sometimes needed with electrical stimulation. If the electrodes are implanted, this is done through surgery.  · Sometimes repairs need to be made through surgery. For example, the size of the bladder can be changed. This is usually done in severe cases only.  HOME CARE INSTRUCTIONS   · Take any medications your healthcare provider prescribed or suggested. Follow the directions carefully.  · Practice any lifestyle changes that are recommended. These might include:  · Drinking less fluid or drinking at different times of the day. If you need to urinate often during the night, for  example, you may need to stop drinking fluids early in the evening.  · Cutting down on caffeine or alcohol. They can both make an overactive bladder worse. Caffeine is found in coffee, tea and sodas.  · Doing Kegel exercises to strengthen muscles.  · Losing weight, if that is recommended.  · Eating a healthy and balanced diet. This will help you avoid constipation.  · Keep a journal or a log. You might be asked to record how much you drink and when, and also when you feel the need to urinate.  · Learn how to care for implants or other devices, such as pessaries.  SEEK MEDICAL CARE IF:   · Your overactive bladder gets worse.  · You feel increased pain or irritation when you urinate.  · You notice blood in your urine.  · You have questions about any medications or devices that your healthcare provider recommended.  · You notice blood, pus or swelling at the site of any test or treatment procedure.  · You have an oral temperature above 102° F (38.9° C).  SEEK IMMEDIATE MEDICAL CARE IF:   You have an oral temperature above 102° F (38.9° C), not controlled by medicine.  Document Released: 11/17/2008 Document Revised: 04/15/2011 Document Reviewed: 11/17/2008  [employer]® Patient Information ©2014 [employer].

## 2013-03-17 NOTE — MAU Provider Note (Signed)
History     CSN: [id number]  Arrival date and time: 03/17/13 1119   First Provider Initiated Contact with Patient 03/17/13 1210      Chief Complaint  Patient presents with  . Urinary Tract Infection   HPI  Elizabeth Warren is a 33 y.o. female who presents to the MAU with suprapubic pain x 3 days. She describes the pain as stabbing "like a knife" when she urinates and rates it a 10/10. She does not have pain in between urination. She reports an increase in urination frequency (about 20x per day), scant urine most of the time, and bladder fullness. She also has some hematuria but says it may be attributed to her menstrual cycle. She has tried taking ibuprofen and tylenol without relief. She denies fevers, chills, N/V/D, HA, vaginal discharge, odor, or pruritus.          She was seen in MAU on 03/15/13 for the same symptoms.   OB History   Grav Para Term Preterm Abortions TAB SAB Ect Mult Living   4 4 4  0 0 0 0 0 0 4      Past Medical History  Diagnosis Date  . No pertinent past medical history   . NVD (normal vaginal delivery) 09/20/2010  . Breast feeding status of mother     breast feeds 166 month old  . Medical history non-contributory     Past Surgical History  Procedure Laterality Date  . No past surgeries      Family History  Problem Relation Age of Onset  . Diabetes Mother   . Hypertension Mother   . Diabetes Father   . Hypertension Father   . Diabetes Maternal Grandmother   . Hypertension Maternal Grandmother   . Diabetes Maternal Grandfather   . Hypertension Maternal Grandfather   . Diabetes Paternal Grandmother   . Hypertension Paternal Grandmother   . Diabetes Paternal Grandfather   . Hypertension Paternal Grandfather     History  Substance Use Topics  . Smoking status: Never Smoker   . Smokeless tobacco: Never Used  . Alcohol Use: No    Allergies: No Known Allergies  Prescriptions prior to admission  Medication Sig Dispense Refill  . acetaminophen  (TYLENOL) 500 MG tablet Take 500 mg by mouth every 6 (six) hours as needed for moderate pain.      Marland Kitchen. ibuprofen (ADVIL,MOTRIN) 200 MG tablet Take 4 tablets (800 mg total) by mouth every 8 (eight) hours as needed for pain (back pain).  60 tablet  2  . Multiple Vitamins-Minerals (MULTIVITAMIN WITH MINERALS) tablet Take 1 tablet by mouth daily.          Review of Systems  Constitutional: Negative for fever and chills.  HENT: Negative for sore throat.   Respiratory: Negative for cough and shortness of breath.   Cardiovascular: Negative for chest pain and leg swelling.  Gastrointestinal: Negative for nausea, vomiting, abdominal pain, diarrhea, constipation, blood in stool and melena.  Genitourinary: Positive for dysuria, frequency and hematuria. Negative for urgency.  Musculoskeletal: Positive for back pain.       States back pain is normal during menstruation.  Skin: Negative for itching and rash.  Neurological: Negative for dizziness, weakness and headaches.   Physical Exam   Blood pressure 141/93, pulse 97, temperature 98.9 F (37.2 C), temperature source Oral, resp. rate 20. Patient Vitals for the past 24 hrs:  BP Temp Temp src Pulse Resp  03/17/13 1336 108/74 mmHg - - 101 18  03/17/13  1156 141/93 mmHg 98.9 F (37.2 C) Oral 97 20   Physical Exam  Constitutional: She is oriented to person, place, and time. She appears well-developed and well-nourished.  HENT:  Head: Normocephalic.  Cardiovascular: Normal rate, regular rhythm and normal heart sounds.  Exam reveals no gallop and no friction rub.   No murmur heard. Respiratory: Effort normal and breath sounds normal. No respiratory distress. She has no wheezes. She has no rales.  GI: Soft. Bowel sounds are normal. She exhibits no distension. There is no tenderness. There is no rebound.  Neurological: She is alert and oriented to person, place, and time.  Skin: Skin is warm. No rash noted. No erythema.   Negative CVA tenderness  MAU  Course  Procedures  MDM Results for orders placed during the hospital encounter of 03/17/13 (from the past 24 hour(s))  URINALYSIS, ROUTINE W REFLEX MICROSCOPIC     Status: Abnormal   Collection Time    03/17/13 11:35 AM      Result Value Ref Range   Color, Urine YELLOW  YELLOW   APPearance HAZY (*) CLEAR   Specific Gravity, Urine 1.020  1.005 - 1.030   pH 6.5  5.0 - 8.0   Glucose, UA NEGATIVE  NEGATIVE mg/dL   Hgb urine dipstick MODERATE (*) NEGATIVE   Bilirubin Urine NEGATIVE  NEGATIVE   Ketones, ur NEGATIVE  NEGATIVE mg/dL   Protein, ur NEGATIVE  NEGATIVE mg/dL   Urobilinogen, UA 0.2  0.0 - 1.0 mg/dL   Nitrite NEGATIVE  NEGATIVE   Leukocytes, UA NEGATIVE  NEGATIVE  URINE MICROSCOPIC-ADD ON     Status: Abnormal   Collection Time    03/17/13 11:35 AM      Result Value Ref Range   Squamous Epithelial / LPF FEW (*) RARE   WBC, UA 0-2  <3 WBC/hpf   RBC / HPF 7-10  <3 RBC/hpf   Bacteria, UA RARE  RARE    Assessment and Plan  Assessment:: Elizabeth Warren is a 33 y.o. female who presents to the MAU with suprapubic pain x 3 days. She only experiences this pain when she urinates. She also c/o urinary retention w/ an increased in frequency. Her physical exam was unremarkable. No abdominal tenderness or CVAT. Based on her HPI and PE, her symptoms are most consistent with cystitis.   1. Cystitis, acute   2. Painful bladder spasm     Plan: D/C home Urine sent for culture Drink plenty of water, decrease caffeine intake Pyridium 200 mg TID  Percocet 5/325, take 1-2 tabs every 6 hours PRN x10 tabs Follow up in Gyn clinic if symptoms persist  Return to MAU as needed  [place], Bennie-John, T 03/17/2013, 12:30 PM   I have seen this patient and agree with the above student's note.    LEFTWICH-KIRBY, Amika Tassin Certified Nurse-Midwife

## 2013-03-19 ENCOUNTER — Telehealth: Payer: Self-pay | Admitting: *Deleted

## 2013-03-19 LAB — URINE CULTURE

## 2013-03-19 MED ORDER — CIPROFLOXACIN HCL 500 MG PO TABS: 500.0000 mg | ORAL_TABLET | Freq: Two times a day (BID) | ORAL | Status: DC

## 2013-03-19 NOTE — Telephone Encounter (Signed)
Called pt and left message that her urine results have been reviewed by the midwife Sharen CounterLisa Leftwich-Kirby and show that she has the beginning of a bladder infection. We have sent antibiotic prescription to her pharmacy. She may call back if she has any questions.

## 2013-03-27 ENCOUNTER — Encounter (HOSPITAL_COMMUNITY): Payer: Self-pay | Admitting: *Deleted

## 2013-03-27 ENCOUNTER — Inpatient Hospital Stay (HOSPITAL_COMMUNITY)
Admission: AD | Admit: 2013-03-27 | Discharge: 2013-03-27 | Disposition: A | Discharge status: Home / Self Care | Payer: Medicaid Other | Source: Ambulatory Visit | Attending: Obstetrics & Gynecology | Admitting: Obstetrics & Gynecology

## 2013-03-27 DIAGNOSIS — R319 Hematuria, unspecified: Secondary | ICD-10-CM | POA: Insufficient documentation

## 2013-03-27 DIAGNOSIS — B3731 Acute candidiasis of vulva and vagina: Secondary | ICD-10-CM

## 2013-03-27 DIAGNOSIS — B373 Candidiasis of vulva and vagina: Secondary | ICD-10-CM

## 2013-03-27 DIAGNOSIS — R300 Dysuria: Secondary | ICD-10-CM | POA: Insufficient documentation

## 2013-03-27 DIAGNOSIS — N898 Other specified noninflammatory disorders of vagina: Secondary | ICD-10-CM | POA: Insufficient documentation

## 2013-03-27 LAB — URINALYSIS, ROUTINE W REFLEX MICROSCOPIC
BILIRUBIN URINE: NEGATIVE
Glucose, UA: NEGATIVE mg/dL
HGB URINE DIPSTICK: NEGATIVE
KETONES UR: 15 mg/dL — AB
Leukocytes, UA: NEGATIVE
NITRITE: NEGATIVE
PROTEIN: NEGATIVE mg/dL
Specific Gravity, Urine: >1.03 — ABNORMAL HIGH (ref 1.005–1.030)
UROBILINOGEN UA: 0.2 mg/dL (ref 0.0–1.0)
pH: 6 (ref 5.0–8.0)

## 2013-03-27 LAB — WET PREP, GENITAL
Clue Cells Wet Prep HPF POC: NONE SEEN
Trich, Wet Prep: NONE SEEN
YEAST WET PREP: NONE SEEN

## 2013-03-27 LAB — POCT PREGNANCY, URINE: Preg Test, Ur: NEGATIVE

## 2013-03-27 MED ORDER — FLUCONAZOLE 150 MG PO TABS: 150.0000 mg | ORAL_TABLET | Freq: Every day | ORAL | Status: DC

## 2013-03-27 NOTE — MAU Provider Note (Signed)
History     CSN: [id number]  Arrival date and time: 03/27/13 2054   First Provider Initiated Contact with Patient 03/27/13 2147      Chief Complaint  Patient presents with  . Dysuria  . Hematuria   HPI Comments: Elizabeth Warren 33 y.o. [id number] presents to MAU with ongoing pain with urination and vaginal itching. She speaks Arabic and never stops talking about the multiple times she had been in for care and treated for UTI. She was last seen 03/17/13, see that note. Most of her urine's except for today have had blood in them. She has not had infection, but was treated last visit with Cipro to see if it would help. She states she is also very itchy in her vaginal area, and has Mirena IUD.       Past Medical History  Diagnosis Date  . No pertinent past medical history   . NVD (normal vaginal delivery) 09/20/2010  . Breast feeding status of mother     breast feeds 67 month old  . Medical history non-contributory     Past Surgical History  Procedure Laterality Date  . No past surgeries      Family History  Problem Relation Age of Onset  . Diabetes Mother   . Hypertension Mother   . Diabetes Father   . Hypertension Father   . Diabetes Maternal Grandmother   . Hypertension Maternal Grandmother   . Diabetes Maternal Grandfather   . Hypertension Maternal Grandfather   . Diabetes Paternal Grandmother   . Hypertension Paternal Grandmother   . Diabetes Paternal Grandfather   . Hypertension Paternal Grandfather     History  Substance Use Topics  . Smoking status: Never Smoker   . Smokeless tobacco: Never Used  . Alcohol Use: No    Allergies: No Known Allergies  Prescriptions prior to admission  Medication Sig Dispense Refill  . acetaminophen (TYLENOL) 500 MG tablet Take 500 mg by mouth every 6 (six) hours as needed for moderate pain.      . Multiple Vitamins-Minerals (MULTIVITAMIN WITH MINERALS) tablet Take 1 tablet by mouth daily.        Marland Kitchen oxyCODONE-acetaminophen  (PERCOCET/ROXICET) 5-325 MG per tablet Take 1-2 tablets by mouth every 6 (six) hours as needed.  10 tablet  0  . ciprofloxacin (CIPRO) 500 MG tablet Take 1 tablet (500 mg total) by mouth 2 (two) times daily.  6 tablet  0  . ibuprofen (ADVIL,MOTRIN) 200 MG tablet Take 4 tablets (800 mg total) by mouth every 8 (eight) hours as needed for pain (back pain).  60 tablet  2  . phenazopyridine (PYRIDIUM) 200 MG tablet Take 1 tablet (200 mg total) by mouth 3 (three) times daily as needed for pain.  15 tablet  0    Review of Systems  Constitutional: Negative.   HENT: Negative.   Eyes: Negative.   Respiratory: Negative.   Cardiovascular: Negative.   Gastrointestinal: Negative.   Genitourinary: Positive for dysuria and hematuria.  Musculoskeletal: Negative.   Skin: Negative.   Neurological: Negative.   Endo/Heme/Allergies: Negative.   Psychiatric/Behavioral: The patient is nervous/anxious.    Physical Exam   Blood pressure 116/67, pulse 100, temperature 98.3 F (36.8 C), resp. rate 20, height 5\' 5"  (1.651 m), weight 83.643 kg (184 lb 6.4 oz), last menstrual period 03/04/2013.  Physical Exam  Constitutional: She is oriented to person, place, and time. She appears well-developed and well-nourished. No distress.  HENT:  Head: Normocephalic and atraumatic.  Eyes:  Pupils are equal, round, and reactive to light.  GI: Soft. Bowel sounds are normal.  Genitourinary:  Genital:External labia minora there is 1 cm tear/ open area that is bleeding Vaginal:small amt white discharge Cervix:IUD strings seen Bimanual:negative   Musculoskeletal: Normal range of motion.  Neurological: She is alert and oriented to person, place, and time.  Psychiatric: Her mood appears anxious.   Results for orders placed during the hospital encounter of 03/27/13 (from the past 24 hour(s))  URINALYSIS, ROUTINE W REFLEX MICROSCOPIC     Status: Abnormal   Collection Time    03/27/13  9:17 PM      Result Value Ref Range    Color, Urine YELLOW  YELLOW   APPearance CLEAR  CLEAR   Specific Gravity, Urine >1.030 (*) 1.005 - 1.030   pH 6.0  5.0 - 8.0   Glucose, UA NEGATIVE  NEGATIVE mg/dL   Hgb urine dipstick NEGATIVE  NEGATIVE   Bilirubin Urine NEGATIVE  NEGATIVE   Ketones, ur 15 (*) NEGATIVE mg/dL   Protein, ur NEGATIVE  NEGATIVE mg/dL   Urobilinogen, UA 0.2  0.0 - 1.0 mg/dL   Nitrite NEGATIVE  NEGATIVE   Leukocytes, UA NEGATIVE  NEGATIVE  POCT PREGNANCY, URINE     Status: None   Collection Time    03/27/13  9:27 PM      Result Value Ref Range   Preg Test, Ur NEGATIVE  NEGATIVE  WET PREP, GENITAL     Status: Abnormal   Collection Time    03/27/13 10:10 PM      Result Value Ref Range   Yeast Wet Prep HPF POC NONE SEEN  NONE SEEN   Trich, Wet Prep NONE SEEN  NONE SEEN   Clue Cells Wet Prep HPF POC NONE SEEN  NONE SEEN   WBC, Wet Prep HPF POC FEW (*) NONE SEEN    MAU Course  Procedures  MDM  Wet prep, gc, chlamydia, HSV cultures  Assessment and Plan   A: Dysuria/ maybe related to vaginal lesion Hematuria   P: Will treat with Diflucan 150 mg X 2 tabs Await cultures Advised referral to Urology for hematuria  Elizabeth Warren, Elizabeth Warren 03/27/2013, 10:55 PM

## 2013-03-27 NOTE — Discharge Instructions (Signed)
Candida Infection, Adult A candida infection (also called yeast, fungus and Monilia infection) is an overgrowth of yeast that can occur anywhere on the body. A yeast infection commonly occurs in warm, moist body areas. Usually, the infection remains localized but can spread to become a systemic infection. A yeast infection may be a sign of a more severe disease such as diabetes, leukemia, or AIDS. A yeast infection can occur in both men and women. In women, Candida vaginitis is a vaginal infection. It is one of the most common causes of vaginitis. Men usually do not have symptoms or know they have an infection until other problems develop. Men may find out they have a yeast infection because their sex partner has a yeast infection. Uncircumcised men are more likely to get a yeast infection than circumcised men. This is because the uncircumcised glans is not exposed to air and does not remain as dry as that of a circumcised glans. Older adults may develop yeast infections around dentures. CAUSES  Women  Antibiotics.  Steroid medication taken for a long time.  Being overweight (obese).  Diabetes.  Poor immune condition.  Certain serious medical conditions.  Immune suppressive medications for organ transplant patients.  Chemotherapy.  Pregnancy.  Menstration.  Stress and fatigue.  Intravenous drug use.  Oral contraceptives.  Wearing tight-fitting clothes in the crotch area.  Catching it from a sex partner who has a yeast infection.  Spermicide.  Intravenous, urinary, or other catheters. Men  Catching it from a sex partner who has a yeast infection.  Having oral or anal sex with a person who has the infection.  Spermicide.  Diabetes.  Antibiotics.  Poor immune system.  Medications that suppress the immune system.  Intravenous drug use.  Intravenous, urinary, or other catheters. SYMPTOMS  Women  Thick, white vaginal discharge.  Vaginal itching.  Redness and  swelling in and around the vagina.  Irritation of the lips of the vagina and perineum.  Blisters on the vaginal lips and perineum.  Painful sexual intercourse.  Low blood sugar (hypoglycemia).  Painful urination.  Bladder infections.  Intestinal problems such as constipation, indigestion, bad breath, bloating, increase in gas, diarrhea, or loose stools. Men  Men may develop intestinal problems such as constipation, indigestion, bad breath, bloating, increase in gas, diarrhea, or loose stools.  Dry, cracked skin on the penis with itching or discomfort.  Jock itch.  Dry, flaky skin.  Athlete's foot.  Hypoglycemia. DIAGNOSIS  Women  A history and an exam are performed.  The discharge may be examined under a microscope.  A culture may be taken of the discharge. Men  A history and an exam are performed.  Any discharge from the penis or areas of cracked skin will be looked at under the microscope and cultured.  Stool samples may be cultured. TREATMENT  Women  Vaginal antifungal suppositories and creams.  Medicated creams to decrease irritation and itching on the outside of the vagina.  Warm compresses to the perineal area to decrease swelling and discomfort.  Oral antifungal medications.  Medicated vaginal suppositories or cream for repeated or recurrent infections.  Wash and dry the irritation areas before applying the cream.  Eating yogurt with lactobacillus may help with prevention and treatment.  Sometimes painting the vagina with gentian violet solution may help if creams and suppositories do not work. Men  Antifungal creams and oral antifungal medications.  Sometimes treatment must continue for 30 days after the symptoms go away to prevent recurrence. HOME CARE   INSTRUCTIONS  Women  Use cotton underwear and avoid tight-fitting clothing.  Avoid colored, scented toilet paper and deodorant tampons or pads.  Do not douche.  Keep your diabetes  under control.  Finish all the prescribed medications.  Keep your skin clean and dry.  Consume milk or yogurt with lactobacillus active culture regularly. If you get frequent yeast infections and think that is what the infection is, there are over-the-counter medications that you can get. If the infection does not show healing in 3 days, talk to your caregiver.  Tell your sex partner you have a yeast infection. Your partner may need treatment also, especially if your infection does not clear up or recurs. Men  Keep your skin clean and dry.  Keep your diabetes under control.  Finish all prescribed medications.  Tell your sex partner that you have a yeast infection so they can be treated if necessary. SEEK MEDICAL CARE IF:   Your symptoms do not clear up or worsen in one week after treatment.  You have an oral temperature above 102 F (38.9 C).  You have trouble swallowing or eating for a prolonged time.  You develop blisters on and around your vagina.  You develop vaginal bleeding and it is not your menstrual period.  You develop abdominal pain.  You develop intestinal problems as mentioned above.  You get weak or lightheaded.  You have painful or increased urination.  You have pain during sexual intercourse. MAKE SURE YOU:   Understand these instructions.  Will watch your condition.  Will get help right away if you are not doing well or get worse. Document Released: 02/29/2004 Document Revised: 04/15/2011 Document Reviewed: 06/12/2009 [facility] Patient Information 2014 [place].  Hematuria, Adult Hematuria is blood in your urine. It can be caused by a bladder infection, kidney infection, prostate infection, kidney stone, or cancer of your urinary tract. Infections can usually be treated with medicine, and a kidney stone usually will pass through your urine. If neither of these is the cause of your hematuria, further workup to find out the reason may be  needed. It is very important that you tell your health care provider about any blood you see in your urine, even if the blood stops without treatment or happens without causing pain. Blood in your urine that happens and then stops and then happens again can be a symptom of a very serious condition. Also, pain is not a symptom in the initial stages of many urinary cancers. HOME CARE INSTRUCTIONS   Drink lots of fluid, 3 4 quarts a day. If you have been diagnosed with an infection, cranberry juice is especially recommended, in addition to large amounts of water.  Avoid caffeine, tea, and carbonated beverages, because they tend to irritate the bladder.  Avoid alcohol because it may irritate the prostate.  Only take over-the-counter or prescription medicines for pain, discomfort, or fever as directed by your health care provider.  If you have been diagnosed with a kidney stone, follow your health care provider's instructions regarding straining your urine to catch the stone.  Empty your bladder often. Avoid holding urine for long periods of time.  After a bowel movement, women should cleanse front to back. Use each tissue only once.  Empty your bladder before and after sexual intercourse if you are a female. SEEK MEDICAL CARE IF: You develop back pain, fever, a feeling of sickness in your stomach (nausea), or vomiting or if your symptoms are not better in 3 days. Return  sooner if you are getting worse. SEEK IMMEDIATE MEDICAL CARE IF:   You have a persistent fever, with a temperature of 101.35F (38.8C) or greater.  You develop severe vomiting and are unable to keep the medicine down.  You develop severe back or abdominal pain despite taking your medicines.  You begin passing a large amount of blood or clots in your urine.  You feel extremely weak or faint, or you pass out. MAKE SURE YOU:   Understand these instructions.  Will watch your condition.  Will get help right away if you  are not doing well or get worse. Document Released: 01/21/2005 Document Revised: 11/11/2012 Document Reviewed: 09/21/2012 [facility] Patient Information 2014 [place], [place].

## 2013-03-27 NOTE — MAU Note (Signed)
Has been treated for bladder infection and felt better for couple days. Now having bladder pain again, frequent urination, sharp pain in bladder.

## 2013-03-27 NOTE — Progress Notes (Signed)
Herpes Culture obtained and sent

## 2013-03-29 LAB — HERPES SIMPLEX VIRUS CULTURE: Culture: NOT DETECTED

## 2013-03-29 LAB — GC/CHLAMYDIA PROBE AMP
CT PROBE, AMP APTIMA: NEGATIVE
GC PROBE AMP APTIMA: NEGATIVE

## 2013-03-29 NOTE — MAU Provider Note (Signed)
Attestation of Attending Supervision of Advanced Practitioner (CNM/NP): Evaluation and management procedures were performed by the Advanced Practitioner under my supervision and collaboration.  I have reviewed the Advanced Practitioner's note and chart, and I agree with the management and plan.  HARRAWAY-SMITH, Attie Nawabi 4:01 PM

## 2013-04-13 ENCOUNTER — Ambulatory Visit (INDEPENDENT_AMBULATORY_CARE_PROVIDER_SITE_OTHER): Payer: Medicaid Other | Admitting: Family Medicine

## 2013-04-13 ENCOUNTER — Encounter: Payer: Self-pay | Admitting: Family Medicine

## 2013-04-13 VITALS — BP 122/89 | HR 94 | Temp 98.3°F | Wt 181.0 lb

## 2013-04-13 DIAGNOSIS — N3090 Cystitis, unspecified without hematuria: Secondary | ICD-10-CM

## 2013-04-13 DIAGNOSIS — R319 Hematuria, unspecified: Secondary | ICD-10-CM

## 2013-04-13 DIAGNOSIS — R109 Unspecified abdominal pain: Secondary | ICD-10-CM

## 2013-04-13 LAB — POCT UA - MICROSCOPIC ONLY

## 2013-04-13 LAB — POCT URINALYSIS DIPSTICK
Bilirubin, UA: NEGATIVE
Glucose, UA: NEGATIVE
KETONES UA: NEGATIVE
Nitrite, UA: NEGATIVE
Urobilinogen, UA: 0.2
pH, UA: 6

## 2013-04-13 NOTE — Patient Instructions (Signed)
You are doing well There are no signs of infection Please come back fi your symptoms worsen Please take the pyridium for pain

## 2013-04-13 NOTE — Assessment & Plan Note (Signed)
Reviewed ED notes Urine today nml (blood from menses) Currently asymptomatic w/o meds x 2 wks Off ABX and fluconazole Pyridium prn if needed NSAIDs prn

## 2013-04-13 NOTE — Assessment & Plan Note (Signed)
Currently related to menstration  nsaids prn

## 2013-04-13 NOTE — Addendum Note (Signed)
Addended by: [place]il[place]ORDAN, Sigurd Pugh on: 04/13/2013 12:18 PM   Modules accepted: Orders

## 2013-04-13 NOTE — Progress Notes (Signed)
Elizabeth Warren is a 33 y.o. female [facility] from recent bladder and yeast infections.    About 1 month ago, she starting developing significant dysuria and gross hematuria.  She decided to go to the ED on 2/9, which shortly sent her home with no treatment.  After her symptoms did not resolve for a few days, she decided to go back to the ED on 2/11.   On her second visit, the ED diagnosed her with a "bladder infection," and prescribed her a 3 day course of Cipro.  She completed that course, but need developed a yeast infection and went back to the ED on 2/21.  She was given 2 doses of Fluconazole and sent home.  She has never had any fevers, CVA tenderness.  Since this time, her symptoms have resolved and she is here for reassurance.  Not currently any blood in urine.     The following portions of the patient's history were reviewed and updated as appropriate: allergies, current medications, past medical history, family and social history, and problem list.  Patient is a nonsmoker.  Past Medical History  Diagnosis Date  . No pertinent past medical history   . NVD (normal vaginal delivery) 09/20/2010  . Breast feeding status of mother     breast feeds 416 month old  . Medical history non-contributory     ROS as above otherwise neg.    Medications reviewed. Current Outpatient Prescriptions  Medication Sig Dispense Refill  . phenazopyridine (PYRIDIUM) 200 MG tablet Take 1 tablet (200 mg total) by mouth 3 (three) times daily as needed for pain.  15 tablet  0  . acetaminophen (TYLENOL) 500 MG tablet Take 500 mg by mouth every 6 (six) hours as needed for moderate pain.      Marland Kitchen. ibuprofen (ADVIL,MOTRIN) 200 MG tablet Take 4 tablets (800 mg total) by mouth every 8 (eight) hours as needed for pain (back pain).  60 tablet  2  . Multiple Vitamins-Minerals (MULTIVITAMIN WITH MINERALS) tablet Take 1 tablet by mouth daily.         No current facility-administered medications  for this visit.    Exam: BP 122/89  Pulse 94  Temp(Src) 98.3 F (36.8 C) (Oral)  Wt 181 lb (82.101 kg)  LMP 04/13/2013 Gen: Well NAD HEENT: EOMI,  MMM   No results found for this or any previous visit (from the past 72 hour(s)).  A/P (as seen in Problem list)  Stomach cramps Currently related to menstration  nsaids prn  Cystitis Reviewed ED notes Urine today nml (blood from menses) Currently asymptomatic w/o meds x 2 wks Off ABX and fluconazole Pyridium prn if needed NSAIDs prn

## 2013-12-06 ENCOUNTER — Encounter: Payer: Self-pay | Admitting: Family Medicine

## 2013-12-18 ENCOUNTER — Emergency Department (HOSPITAL_COMMUNITY)
Admission: EM | Admit: 2013-12-18 | Discharge: 2013-12-18 | Disposition: A | Discharge status: Home / Self Care | Payer: Medicaid Other | Attending: Emergency Medicine | Admitting: Emergency Medicine

## 2013-12-18 ENCOUNTER — Encounter (HOSPITAL_COMMUNITY): Payer: Self-pay | Admitting: *Deleted

## 2013-12-18 DIAGNOSIS — Z791 Long term (current) use of non-steroidal anti-inflammatories (NSAID): Secondary | ICD-10-CM | POA: Diagnosis not present

## 2013-12-18 DIAGNOSIS — Y9289 Other specified places as the place of occurrence of the external cause: Secondary | ICD-10-CM | POA: Diagnosis not present

## 2013-12-18 DIAGNOSIS — Y9389 Activity, other specified: Secondary | ICD-10-CM | POA: Diagnosis not present

## 2013-12-18 DIAGNOSIS — Y288XXA Contact with other sharp object, undetermined intent, initial encounter: Secondary | ICD-10-CM | POA: Insufficient documentation

## 2013-12-18 DIAGNOSIS — Z79899 Other long term (current) drug therapy: Secondary | ICD-10-CM | POA: Diagnosis not present

## 2013-12-18 DIAGNOSIS — T148XXA Other injury of unspecified body region, initial encounter: Secondary | ICD-10-CM

## 2013-12-18 DIAGNOSIS — Y998 Other external cause status: Secondary | ICD-10-CM | POA: Insufficient documentation

## 2013-12-18 DIAGNOSIS — S81032A Puncture wound without foreign body, left knee, initial encounter: Secondary | ICD-10-CM | POA: Insufficient documentation

## 2013-12-18 MED ORDER — NAPROXEN 500 MG PO TABS: 500.0000 mg | ORAL_TABLET | Freq: Two times a day (BID) | ORAL | Status: DC

## 2013-12-18 NOTE — ED Provider Notes (Signed)
Pt is a 33 y/o female who presents after having a small puncture wound to her L knee from a small pencil tip.  She has mild ongoing pain in that area after trying to remove the tip of the pencil - got some out, worried about ongoing retained FB.  No fevers, no chills and able to ambulate without difficulty.  On exam she has small punctate puncture just superior to the patella anteriorly, no d/c, 2mm of redness, no other findings, no induration, no pain with ROM of the knee which is very supple. '  Provide local wound care, clean skin, no infection seen, just local inflammation (<422mm)  Procedure Note:  Ultrasound soft tissue performed by No att. providers found  Lower extremity Other (refer to comments) Other No FB seen No abcess noted, No cellulitis noted and No abnormal findings noted   Medical screening examination/treatment/procedure(s) were conducted as a shared visit with non-physician practitioner(s) and myself.  I personally evaluated the patient during the encounter.  Clinical Impression:   Final diagnoses:  Puncture wound         Vida RollerBrian D Rayvion Stumph, MD 12/19/13 [phone]

## 2013-12-18 NOTE — ED Provider Notes (Signed)
CSN: [id number]     Arrival date & time 12/18/13  [id number] History   First MD Initiated Contact with Patient 12/18/13 [phone]     Chief Complaint  Patient presents with  . Puncture Wound   Elizabeth Warren is a 33 y.o. female who presents to the ED after she sustained a puncture wound above her left knee 3 days ago. She states she was getting into bed 3 days ago when a wooden pencil left in her bed stabbed her in the top of her left knee by accident. She reports she was able to remove a small piece of lead from the top of her knee but has had continued pain. She reports pain while touching the area and pain with movement of her knee. She denies pain in her joint. She reports taking some unknown pain medicine yesterday with relief of her pain.She denies fevers, chills, loss of range of motion, swelling, discharge, red streaking. She denies other injuries to her knee. She denies other complaints.  (Consider location/radiation/quality/duration/timing/severity/associated sxs/prior Treatment) The history is provided by the patient.    Past Medical History  Diagnosis Date  . No pertinent past medical history   . NVD (normal vaginal delivery) 09/20/2010  . Breast feeding status of mother     breast feeds 476 month old  . Medical history non-contributory    Past Surgical History  Procedure Laterality Date  . No past surgeries     Family History  Problem Relation Age of Onset  . Diabetes Mother   . Hypertension Mother   . Diabetes Father   . Hypertension Father   . Diabetes Maternal Grandmother   . Hypertension Maternal Grandmother   . Diabetes Maternal Grandfather   . Hypertension Maternal Grandfather   . Diabetes Paternal Grandmother   . Hypertension Paternal Grandmother   . Diabetes Paternal Grandfather   . Hypertension Paternal Grandfather    History  Substance Use Topics  . Smoking status: Never Smoker   . Smokeless tobacco: Never Used  . Alcohol Use: No   OB History    Gravida Para  Term Preterm AB TAB SAB Ectopic Multiple Living   4 4 4  0 0 0 0 0 0 4     Review of Systems  Constitutional: Negative for fever and chills.  HENT: Negative for ear pain and sore throat.   Eyes: Negative for pain.  Respiratory: Negative for cough.   Gastrointestinal: Negative for abdominal pain.  Musculoskeletal: Negative for joint swelling, arthralgias and gait problem.  Skin: Positive for wound. Negative for pallor and rash.  Neurological: Negative for weakness and headaches.  All other systems reviewed and are negative.     Allergies  Review of patient's allergies indicates no known allergies.  Home Medications   Prior to Admission medications   Medication Sig Start Date End Date Taking? Authorizing Provider  acetaminophen (TYLENOL) 500 MG tablet Take 500 mg by mouth every 6 (six) hours as needed for moderate pain.    Historical Provider, MD  ibuprofen (ADVIL,MOTRIN) 200 MG tablet Take 4 tablets (800 mg total) by mouth every 8 (eight) hours as needed for pain (back pain). 05/29/12   Archie PattenNatalie K Frazier, CNM  Multiple Vitamins-Minerals (MULTIVITAMIN WITH MINERALS) tablet Take 1 tablet by mouth daily.      Historical Provider, MD  naproxen (NAPROSYN) 500 MG tablet Take 1 tablet (500 mg total) by mouth 2 (two) times daily with a meal. 12/18/13   Einar GipWilliam Duncan Earline Stiner, PA-C  phenazopyridine (PYRIDIUM)  200 MG tablet Take 1 tablet (200 mg total) by mouth 3 (three) times daily as needed for pain. 03/17/13   Lisa A Leftwich-Kirby, CNM   BP 141/91 mmHg  Pulse 87  Temp(Src) 98.2 F (36.8 C) (Oral)  Resp 20  Ht 5\' 5"  (1.651 m)  Wt 183 lb (83.008 kg)  BMI 30.45 kg/m2  SpO2 100% Physical Exam  Constitutional: She appears well-developed and well-nourished. No distress.  HENT:  Head: Normocephalic and atraumatic.  Eyes: Right eye exhibits no discharge. Left eye exhibits no discharge.  Pulmonary/Chest: Effort normal. No respiratory distress.  Musculoskeletal: Normal range of motion. She  exhibits no edema.  Full range of motion of her bilateral knees. No pain with range of motion of her left knee.   Neurological: She is alert. Coordination normal.  Skin: Skin is warm and dry. No rash noted. She is not diaphoretic.  Small puncture wound to the anterior aspect of her leg just proximal to her knee joint. No induration or foreign body palpated. No bleeding or discharge.   Psychiatric: She has a normal mood and affect. Her behavior is normal.  Nursing note and vitals reviewed.   ED Course  Procedures (including critical care time) Labs Review Labs Reviewed - No data to display  Imaging Review No results found.   EKG Interpretation None      Filed Vitals:   12/18/13 0653  BP: 141/91  Pulse: 87  Temp: 98.2 F (36.8 C)  TempSrc: Oral  Resp: 20  Height: 5\' 5"  (1.651 m)  Weight: 183 lb (83.008 kg)  SpO2: 100%     MDM   Meds given in ED:  Medications - No data to display  Discharge Medication List as of 12/18/2013  7:19 AM    START taking these medications   Details  naproxen (NAPROSYN) 500 MG tablet Take 1 tablet (500 mg total) by mouth 2 (two) times daily with a meal., Starting 12/18/2013, Until Discontinued, Print        Final diagnoses:  Puncture wound     Elizabeth Warren is a 33 y.o. female since the ED after 3 days ago her left knee was stopped with a pencil. Able to remove the lead still feels there is pain in her left knee. There is no abscess, redness, induration or abscess.  There is no evidence of foreign body. Bedside ultrasound was performed by Dr. Eber HongBrian Miller revealed there is no foreign body. The small wound was seen by me with iodine and was bandaged by me. Patient given Naprosyn for pain control at home. Advised patient to follow-up with her primary care physician this week to ensure she is feeling better. Advised patient to return to the ED with new or worsening symptoms or new concerns. Patient verbalized understanding and agreement  with plan.  Patient was discussed with and evaluated by Dr. Eber HongBrian Miller who agrees with assessment and plan.      Elizabeth ChambersWilliam Duncan Jeslie Lowe, PA-C 12/18/13 [id number]  Vida RollerBrian D Miller, MD 12/19/13 [phone]

## 2013-12-18 NOTE — Discharge Instructions (Signed)
Dressing Change A dressing is a material placed over wounds. It keeps the wound clean, dry, and protected from further injury. This provides an environment that favors wound healing.  BEFORE YOU BEGIN  Get your supplies together. Things you may need include:  Saline solution.  Flexible gauze dressing.  Medicated cream.  Tape.  Gloves.  Abdominal dressing pads.  Gauze squares.  Plastic bags.  Take pain medicine 30 minutes before the dressing change if you need it.  Take a shower before you do the first dressing change of the day. Use plastic wrap or a plastic bag to prevent the dressing from getting wet. REMOVING YOUR OLD DRESSING   Wash your hands with soap and water. Dry your hands with a clean towel.  Put on your gloves.  Remove any tape.  Carefully remove the old dressing. If the dressing sticks, you may dampen it with warm water to loosen it, or follow your caregiver's specific directions.  Remove any gauze or packing tape that is in your wound.  Take off your gloves.  Put the gloves, tape, gauze, or any packing tape into a plastic bag. CHANGING YOUR DRESSING  Open the supplies.  Take the cap off the saline solution.  Open the gauze package so that the gauze remains on the inside of the package.  Put on your gloves.  Clean your wound as told by your caregiver.  If you have been told to keep your wound dry, follow those instructions.  Your caregiver may tell you to do one or more of the following:  Pick up the gauze. Pour the saline solution over the gauze. Squeeze out the extra saline solution.  Put medicated cream or other medicine on your wound if you have been told to do so.  Put the solution soaked gauze only in your wound, not on the skin around it.  Pack your wound loosely or as told by your caregiver.  Put dry gauze on your wound.  Put abdominal dressing pads over the dry gauze if your wet gauze soaks through.  Tape the abdominal dressing  pads in place so they will not fall off. Do not wrap the tape completely around the affected part (arm, leg, abdomen).  Wrap the dressing pads with a flexible gauze dressing to secure it in place.  Take off your gloves. Put them in the plastic bag with the old dressing. Tie the bag shut and throw it away.  Keep the dressing clean and dry until your next dressing change.  Wash your hands. SEEK MEDICAL CARE IF:  Your skin around the wound looks red.  Your wound feels more tender or sore.  You see pus in the wound.  Your wound smells bad.  You have a fever.  Your skin around the wound has a rash that itches and burns.  You see black or yellow skin in your wound that was not there before.  You feel nauseous, throw up, and feel very tired. Document Released: 02/29/2004 Document Revised: 04/15/2011 Document Reviewed: 12/03/2010 [facility] Patient Information 2015 [place], [place]. This information is not intended to replace advice given to you by your health care provider. Make sure you discuss any questions you have with your health care provider. Abrasion An abrasion is a cut or scrape of the skin. Abrasions do not extend through all layers of the skin and most heal within 10 days. It is important to care for your abrasion properly to prevent infection. CAUSES  Most abrasions are caused by  falling on, or gliding across, the ground or other surface. When your skin rubs on something, the outer and inner layer of skin rubs off, causing an abrasion. DIAGNOSIS  Your caregiver will be able to diagnose an abrasion during a physical exam.  TREATMENT  Your treatment depends on how large and deep the abrasion is. Generally, your abrasion will be cleaned with water and a mild soap to remove any dirt or debris. An antibiotic ointment may be put over the abrasion to prevent an infection. A bandage (dressing) may be wrapped around the abrasion to keep it from getting dirty.  You may need a tetanus  shot if:  You cannot remember when you had your last tetanus shot.  You have never had a tetanus shot.  The injury broke your skin. If you get a tetanus shot, your arm may swell, get red, and feel warm to the touch. This is common and not a problem. If you need a tetanus shot and you choose not to have one, there is a rare chance of getting tetanus. Sickness from tetanus can be serious.  HOME CARE INSTRUCTIONS   If a dressing was applied, change it at least once a day or as directed by your caregiver. If the bandage sticks, soak it off with warm water.   Wash the area with water and a mild soap to remove all the ointment 2 times a day. Rinse off the soap and pat the area dry with a clean towel.   Reapply any ointment as directed by your caregiver. This will help prevent infection and keep the bandage from sticking. Use gauze over the wound and under the dressing to help keep the bandage from sticking.   Change your dressing right away if it becomes wet or dirty.   Only take over-the-counter or prescription medicines for pain, discomfort, or fever as directed by your caregiver.   Follow up with your caregiver within 24-48 hours for a wound check, or as directed. If you were not given a wound-check appointment, look closely at your abrasion for redness, swelling, or pus. These are signs of infection. SEEK IMMEDIATE MEDICAL CARE IF:   You have increasing pain in the wound.   You have redness, swelling, or tenderness around the wound.   You have pus coming from the wound.   You have a fever or persistent symptoms for more than 2-3 days.  You have a fever and your symptoms suddenly get worse.  You have a bad smell coming from the wound or dressing.  MAKE SURE YOU:   Understand these instructions.  Will watch your condition.  Will get help right away if you are not doing well or get worse. Document Released: 10/31/2004 Document Revised: 01/08/2012 Document Reviewed:  12/25/2010 [facility] IncExitCare Patient Information 2015 [place], [place]. This information is not intended to replace advice given to you by your health care provider. Make sure you discuss any questions you have with your health care provider.

## 2013-12-18 NOTE — ED Notes (Signed)
Pt states three days ago her daughter left a pencil in her bed. Pt states her left knee was punctured by pencil. Pt reports foreign body in her knee from pencil.

## 2014-01-20 ENCOUNTER — Encounter (HOSPITAL_COMMUNITY): Payer: Self-pay | Admitting: Emergency Medicine

## 2014-01-20 ENCOUNTER — Emergency Department (HOSPITAL_COMMUNITY)
Admission: EM | Admit: 2014-01-20 | Discharge: 2014-01-20 | Disposition: A | Discharge status: Home / Self Care | Payer: Medicaid Other | Attending: Emergency Medicine | Admitting: Emergency Medicine

## 2014-01-20 DIAGNOSIS — L709 Acne, unspecified: Secondary | ICD-10-CM | POA: Diagnosis present

## 2014-01-20 DIAGNOSIS — L739 Follicular disorder, unspecified: Secondary | ICD-10-CM

## 2014-01-20 DIAGNOSIS — Z79899 Other long term (current) drug therapy: Secondary | ICD-10-CM | POA: Diagnosis not present

## 2014-01-20 NOTE — ED Notes (Signed)
Patient coming from home with what appears to be a pimple to the underside of the left breast, denies pain.  No redness or swelling around the area.

## 2014-01-20 NOTE — Discharge Instructions (Signed)

## 2014-01-20 NOTE — ED Notes (Signed)
Dr. Littie DeedsGentry at bedside with female chaperone at bedside during breast exam.

## 2014-01-20 NOTE — ED Provider Notes (Signed)
CSN: [id number]     Arrival date & time 01/20/14  0848 History   First MD Initiated Contact with Patient 01/20/14 [phone]     Chief Complaint  Patient presents with  . Acne    Left breast     (Consider location/radiation/quality/duration/timing/severity/associated sxs/prior Treatment) Patient is a 33 y.o. female presenting with rash.  Rash Location: inferior L breast. Quality: not painful and not red   Severity:  Mild Onset quality:  Gradual Duration:  1 week Timing:  Constant Progression:  Worsening Chronicity:  New Relieved by:  Nothing Worsened by:  Nothing tried Ineffective treatments:  None tried Associated symptoms: no abdominal pain, no nausea and not vomiting     Past Medical History  Diagnosis Date  . No pertinent past medical history   . NVD (normal vaginal delivery) 09/20/2010  . Breast feeding status of mother     breast feeds 506 month old  . Medical history non-contributory    Past Surgical History  Procedure Laterality Date  . No past surgeries     Family History  Problem Relation Age of Onset  . Diabetes Mother   . Hypertension Mother   . Diabetes Father   . Hypertension Father   . Diabetes Maternal Grandmother   . Hypertension Maternal Grandmother   . Diabetes Maternal Grandfather   . Hypertension Maternal Grandfather   . Diabetes Paternal Grandmother   . Hypertension Paternal Grandmother   . Diabetes Paternal Grandfather   . Hypertension Paternal Grandfather    History  Substance Use Topics  . Smoking status: Never Smoker   . Smokeless tobacco: Never Used  . Alcohol Use: No   OB History    Gravida Para Term Preterm AB TAB SAB Ectopic Multiple Living   4 4 4  0 0 0 0 0 0 4     Review of Systems  Gastrointestinal: Negative for nausea, vomiting and abdominal pain.  Skin: Positive for rash.  All other systems reviewed and are negative.     Allergies  Review of patient's allergies indicates no known allergies.  Home Medications    Prior to Admission medications   Medication Sig Start Date End Date Taking? Authorizing Provider  acetaminophen (TYLENOL) 500 MG tablet Take 500 mg by mouth every 6 (six) hours as needed for moderate pain.   Yes Historical Provider, MD  ibuprofen (ADVIL,MOTRIN) 200 MG tablet Take 4 tablets (800 mg total) by mouth every 8 (eight) hours as needed for pain (back pain). 05/29/12  Yes Archie PattenNatalie K Frazier, CNM  Multiple Vitamins-Minerals (MULTIVITAMIN WITH MINERALS) tablet Take 1 tablet by mouth daily.     Yes Historical Provider, MD  naproxen (NAPROSYN) 500 MG tablet Take 1 tablet (500 mg total) by mouth 2 (two) times daily with a meal. Patient not taking: Reported on 01/20/2014 12/18/13   Einar GipWilliam Duncan Dansie, PA-C  phenazopyridine (PYRIDIUM) 200 MG tablet Take 1 tablet (200 mg total) by mouth 3 (three) times daily as needed for pain. Patient not taking: Reported on 01/20/2014 03/17/13   Misty StanleyLisa A Leftwich-Kirby, CNM   BP 111/59 mmHg  Pulse 96  Temp(Src) 97.9 F (36.6 C)  Resp 16  Ht 5\' 5"  (1.651 m)  Wt 185 lb (83.915 kg)  BMI 30.79 kg/m2  SpO2 100%  LMP 01/17/2014 Physical Exam  Constitutional: She is oriented to person, place, and time. She appears well-developed and well-nourished.  HENT:  Head: Normocephalic and atraumatic.  Right Ear: External ear normal.  Left Ear: External ear normal.  Eyes:  Conjunctivae and EOM are normal. Pupils are equal, round, and reactive to light.  Neck: Normal range of motion. Neck supple.  Cardiovascular: Normal rate, regular rhythm, normal heart sounds and intact distal pulses.   Pulmonary/Chest: Effort normal and breath sounds normal. Left breast exhibits no inverted nipple, no mass, no nipple discharge, no skin change and no tenderness. Breasts are symmetrical.  Enlarged hair follicle/pearl under L breast, no underlying mass or skin change.  No erythema  Abdominal: Soft. Bowel sounds are normal. There is no tenderness.  Musculoskeletal: Normal range of  motion.  Neurological: She is alert and oriented to person, place, and time.  Skin: Skin is warm and dry.  Vitals reviewed.   ED Course  Procedures (including critical care time) Labs Review Labs Reviewed - No data to display  Imaging Review No results found.   EKG Interpretation None      MDM   Final diagnoses:  Folliculitis    33 y.o. female without pertinent PMH presents with L follicular prominence under breast.  Likely mild folliculitis vs benign skin tag.  No signs of infection.  No other abnormalities.  Encouraged PCP fu for mammogram.  DC home in stable condition.    I have reviewed all laboratory and imaging studies if ordered as above  1. Folliculitis         Mirian MoMatthew Terianna Peggs, MD 01/20/14 [phone]

## 2014-04-10 ENCOUNTER — Encounter (HOSPITAL_COMMUNITY): Payer: Self-pay | Admitting: *Deleted

## 2014-04-10 ENCOUNTER — Inpatient Hospital Stay (HOSPITAL_COMMUNITY)
Admission: AD | Admit: 2014-04-10 | Discharge: 2014-04-10 | Disposition: A | Discharge status: Home / Self Care | Payer: Medicaid Other | Source: Ambulatory Visit | Attending: Obstetrics | Admitting: Obstetrics

## 2014-04-10 DIAGNOSIS — N644 Mastodynia: Secondary | ICD-10-CM

## 2014-04-10 DIAGNOSIS — R059 Cough, unspecified: Secondary | ICD-10-CM

## 2014-04-10 DIAGNOSIS — R05 Cough: Secondary | ICD-10-CM | POA: Diagnosis not present

## 2014-04-10 DIAGNOSIS — Z3202 Encounter for pregnancy test, result negative: Secondary | ICD-10-CM | POA: Diagnosis not present

## 2014-04-10 DIAGNOSIS — M545 Low back pain, unspecified: Secondary | ICD-10-CM

## 2014-04-10 DIAGNOSIS — M549 Dorsalgia, unspecified: Secondary | ICD-10-CM | POA: Diagnosis present

## 2014-04-10 LAB — URINALYSIS, ROUTINE W REFLEX MICROSCOPIC
Bilirubin Urine: NEGATIVE
GLUCOSE, UA: NEGATIVE mg/dL
KETONES UR: NEGATIVE mg/dL
Leukocytes, UA: NEGATIVE
NITRITE: NEGATIVE
PH: 6 (ref 5.0–8.0)
Protein, ur: NEGATIVE mg/dL
Specific Gravity, Urine: 1.025 (ref 1.005–1.030)
Urobilinogen, UA: 0.2 mg/dL (ref 0.0–1.0)

## 2014-04-10 LAB — POCT PREGNANCY, URINE: PREG TEST UR: NEGATIVE

## 2014-04-10 LAB — URINE MICROSCOPIC-ADD ON

## 2014-04-10 MED ORDER — HYDROCODONE-HOMATROPINE 5-1.5 MG/5ML PO SYRP: 5.0000 mL | ORAL_SOLUTION | Freq: Four times a day (QID) | ORAL | Status: DC | PRN

## 2014-04-10 MED ORDER — GUAIFENESIN 100 MG/5ML PO SOLN
5.0000 mL | Freq: Once | ORAL | Status: DC
  Filled 2014-04-10: qty 15

## 2014-04-10 NOTE — MAU Provider Note (Signed)
History     CSN: [id number]  Arrival date and time: 04/10/14 [id number]   None     Chief Complaint  Patient presents with  . Back Pain  . Breast Pain  . Abdominal Pain   HPI Elizabeth Warren 34 y.o. [id number] nonpregnant female presents to ED complaining of mild back pain, breast pain and concern for pregnancy.  She also requests rx cough medicine as she cannot sleep because her cough has been so bad.  Cough ongoing x 3-4 days.  Mirena in place x 3.5 years.  No fever.  Denies weakness, sob, chest pain.    OB History    Gravida Para Term Preterm AB TAB SAB Ectopic Multiple Living   4 4 4  0 0 0 0 0 0 4      Past Medical History  Diagnosis Date  . No pertinent past medical history   . NVD (normal vaginal delivery) 09/20/2010  . Breast feeding status of mother     breast feeds 736 month old  . Medical history non-contributory     Past Surgical History  Procedure Laterality Date  . No past surgeries      Family History  Problem Relation Age of Onset  . Diabetes Mother   . Hypertension Mother   . Diabetes Father   . Hypertension Father   . Diabetes Maternal Grandmother   . Hypertension Maternal Grandmother   . Diabetes Maternal Grandfather   . Hypertension Maternal Grandfather   . Diabetes Paternal Grandmother   . Hypertension Paternal Grandmother   . Diabetes Paternal Grandfather   . Hypertension Paternal Grandfather     History  Substance Use Topics  . Smoking status: Never Smoker   . Smokeless tobacco: Never Used  . Alcohol Use: No    Allergies: No Known Allergies  Prescriptions prior to admission  Medication Sig Dispense Refill Last Dose  . ibuprofen (ADVIL,MOTRIN) 200 MG tablet Take 4 tablets (800 mg total) by mouth every 8 (eight) hours as needed for pain (back pain). 60 tablet 2 04/09/2014 at Unknown time  . Multiple Vitamins-Minerals (MULTIVITAMIN WITH MINERALS) tablet Take 1 tablet by mouth daily.     04/09/2014 at Unknown time  . PRESCRIPTION MEDICATION Take 1  tablet by mouth daily. Penicillin (took 2 tabs from left over dental work)   04/09/2014 at Unknown time  . naproxen (NAPROSYN) 500 MG tablet Take 1 tablet (500 mg total) by mouth 2 (two) times daily with a meal. (Patient not taking: Reported on 01/20/2014) 30 tablet 0 Not Taking at Unknown time  . phenazopyridine (PYRIDIUM) 200 MG tablet Take 1 tablet (200 mg total) by mouth 3 (three) times daily as needed for pain. (Patient not taking: Reported on 01/20/2014) 15 tablet 0 Not Taking at Unknown time    ROS Pertinent ROS in HPI  Physical Exam   Blood pressure 116/72, pulse 94, temperature 99 F (37.2 C), temperature source Oral, resp. rate 16, height 5\' 5"  (1.651 m), weight 185 lb (83.915 kg), last menstrual period 03/07/2014, SpO2 98 %.  Physical Exam  Constitutional: She is oriented to person, place, and time. She appears well-developed and well-nourished. No distress.  HENT:  Head: Normocephalic and atraumatic.  Eyes: EOM are normal.  Neck: Normal range of motion.  Cardiovascular: Normal rate, regular rhythm and normal heart sounds.   Respiratory: Effort normal. No respiratory distress. She has no wheezes. She has no rales. She exhibits no tenderness.  Pt specifically requests breast exam.  Breasts are pendulus  bilaterally.  Inspection and palpation are negative without drainage from the nipples bilaterally.    GI: Soft. She exhibits no distension. There is no tenderness. There is no guarding.  Musculoskeletal: Normal range of motion.  Neurological: She is alert and oriented to person, place, and time.  Skin: Skin is warm and dry.  Psychiatric: She has a normal mood and affect.    MAU Course  Procedures  MDM Pregnancy test is negative.  NO sign of uti or dehydration.  Pt requesting discharge with "Strong" cough medicine.   Assessment and Plan  A:  1. Cough   2. Bilateral low back pain without sciatica   3. Breast pain in female    P: Discharge to home Hycodan for  cough Reassurance that absence of period is expected with Mirena OTC Tylenol or ibuprofen for discomfort Follow up in clnic with Dr. Gaynell Face prn Patient may return to MAU as needed or if her condition were to change or worsen   Bertram Denver 04/10/2014, 7:46 AM

## 2014-04-10 NOTE — MAU Note (Signed)
Back pain, both breasts pain, low abd pain- started yesterday.  Period not come, reports may be pregnant.  No bleeding. Has Mirena. Felt like swollen lymph nodes in neck with cough and feels cold. Has not checked temperature.

## 2014-04-10 NOTE — Discharge Instructions (Signed)
Cough, Adult  A cough is a reflex that helps clear your throat and airways. It can help heal the body or may be a reaction to an irritated airway. A cough may only last 2 or 3 weeks (acute) or may last more than 8 weeks (chronic).  CAUSES Acute cough:  Viral or bacterial infections. Chronic cough:  Infections.  Allergies.  Asthma.  Post-nasal drip.  Smoking.  Heartburn or acid reflux.  Some medicines.  Chronic lung problems (COPD).  Cancer. SYMPTOMS   Cough.  Fever.  Chest pain.  Increased breathing rate.  High-pitched whistling sound when breathing (wheezing).  Colored mucus that you cough up (sputum). TREATMENT   A bacterial cough may be treated with antibiotic medicine.  A viral cough must run its course and will not respond to antibiotics.  Your caregiver may recommend other treatments if you have a chronic cough. HOME CARE INSTRUCTIONS   Only take over-the-counter or prescription medicines for pain, discomfort, or fever as directed by your caregiver. Use cough suppressants only as directed by your caregiver.  Use a cold steam vaporizer or humidifier in your bedroom or home to help loosen secretions.  Sleep in a semi-upright position if your cough is worse at night.  Rest as needed.  Stop smoking if you smoke. SEEK IMMEDIATE MEDICAL CARE IF:   You have pus in your sputum.  Your cough starts to worsen.  You cannot control your cough with suppressants and are losing sleep.  You begin coughing up blood.  You have difficulty breathing.  You develop pain which is getting worse or is uncontrolled with medicine.  You have a fever. MAKE SURE YOU:   Understand these instructions.  Will watch your condition.  Will get help right away if you are not doing well or get worse. Document Released: 07/20/2010 Document Revised: 04/15/2011 Document Reviewed: 07/20/2010 [employer] Patient Information 2015 [employer]. This information is not intended  to replace advice given to you by your health care provider. Make sure you discuss any questions you have with your health care provider.  

## 2014-04-18 MED ORDER — GUAIFENESIN-CODEINE 100-10 MG/5ML PO SOLN: 5.0000 mL | Freq: Three times a day (TID) | ORAL | Status: DC | PRN

## 2014-04-18 NOTE — Progress Notes (Signed)
Pt returned to MAU stating Hycodan was not covered by Medicaid and [facility] unable to fill.  Original rx written 3/6.  Pt continues to have trouble with cough.   New rx printed for Guaifenesin with codeine.

## 2014-04-28 ENCOUNTER — Encounter (HOSPITAL_COMMUNITY): Payer: Self-pay | Admitting: Emergency Medicine

## 2014-04-28 ENCOUNTER — Emergency Department (HOSPITAL_COMMUNITY): Payer: Medicaid Other

## 2014-04-28 ENCOUNTER — Emergency Department (HOSPITAL_COMMUNITY)
Admission: EM | Admit: 2014-04-28 | Discharge: 2014-04-28 | Disposition: A | Discharge status: Home / Self Care | Payer: Medicaid Other | Attending: Emergency Medicine | Admitting: Emergency Medicine

## 2014-04-28 DIAGNOSIS — J209 Acute bronchitis, unspecified: Secondary | ICD-10-CM | POA: Diagnosis not present

## 2014-04-28 DIAGNOSIS — R000 Tachycardia, unspecified: Secondary | ICD-10-CM | POA: Diagnosis not present

## 2014-04-28 DIAGNOSIS — J40 Bronchitis, not specified as acute or chronic: Secondary | ICD-10-CM

## 2014-04-28 DIAGNOSIS — R05 Cough: Secondary | ICD-10-CM | POA: Diagnosis present

## 2014-04-28 MED ORDER — BENZONATATE 100 MG PO CAPS: 200.0000 mg | ORAL_CAPSULE | Freq: Two times a day (BID) | ORAL | Status: DC | PRN

## 2014-04-28 MED ORDER — AZITHROMYCIN 250 MG PO TABS: 250.0000 mg | ORAL_TABLET | Freq: Every day | ORAL | Status: DC

## 2014-04-28 MED ORDER — ALBUTEROL SULFATE HFA 108 (90 BASE) MCG/ACT IN AERS
2.0000 | INHALATION_SPRAY | RESPIRATORY_TRACT | Status: AC
  Administered 2014-04-28: 2 via RESPIRATORY_TRACT
  Filled 2014-04-28: qty 6.7

## 2014-04-28 NOTE — ED Provider Notes (Signed)
CSN: [id number]     Arrival date & time 04/28/14  2041 History   First MD Initiated Contact with Patient 04/28/14 2241     Chief Complaint  Patient presents with  . Cough   (Consider location/radiation/quality/duration/timing/severity/associated sxs/prior Treatment) HPI  Elizabeth Warren is a 34 yo female presenting with ongoing cough and nasal congestion.  She states 3 weeks ago she began having runny nose, nasal congestion and dry cough.  She was prescribed tessalon perles which helped but she is out of the med. She denies any pain except with coughing.  She denies fever, chills, nausea, vomiting or diarrhea.  Past Medical History  Diagnosis Date  . No pertinent past medical history   . NVD (normal vaginal delivery) 09/20/2010  . Breast feeding status of mother     breast feeds 26 month old  . Medical history non-contributory    Past Surgical History  Procedure Laterality Date  . No past surgeries     Family History  Problem Relation Age of Onset  . Diabetes Mother   . Hypertension Mother   . Diabetes Father   . Hypertension Father   . Diabetes Maternal Grandmother   . Hypertension Maternal Grandmother   . Diabetes Maternal Grandfather   . Hypertension Maternal Grandfather   . Diabetes Paternal Grandmother   . Hypertension Paternal Grandmother   . Diabetes Paternal Grandfather   . Hypertension Paternal Grandfather    History  Substance Use Topics  . Smoking status: Never Smoker   . Smokeless tobacco: Never Used  . Alcohol Use: No   OB History    Gravida Para Term Preterm AB TAB SAB Ectopic Multiple Living   0 0 0 0 0 0 4     Review of Systems  Constitutional: Negative for fever.  HENT: Positive for congestion and rhinorrhea. Negative for sore throat.   Respiratory: Positive for cough.   Gastrointestinal: Negative for vomiting and abdominal pain.  Musculoskeletal: Negative for myalgias.  Skin: Negative for rash.      Allergies  Review of patient's  allergies indicates no known allergies.  Home Medications   Prior to Admission medications   Medication Sig Start Date End Date Taking? Authorizing Provider  guaiFENesin-codeine 100-10 MG/5ML syrup Take 5 mLs by mouth 3 (three) times daily as needed for cough. 04/18/14  Yes Bertram Denver, PA-C  Multiple Vitamins-Minerals (MULTIVITAMIN WITH MINERALS) tablet Take 1 tablet by mouth daily.     Yes Historical Provider, MD  HYDROcodone-homatropine (HYCODAN) 5-1.5 MG/5ML syrup Take 5 mLs by mouth every 6 (six) hours as needed for cough. Patient not taking: Reported on 04/28/2014 04/10/14   Bertram Denver, PA-C  ibuprofen (ADVIL,MOTRIN) 200 MG tablet Take 4 tablets (800 mg total) by mouth every 8 (eight) hours as needed for pain (back pain). Patient not taking: Reported on 04/28/2014 05/29/12   Archie Patten, CNM   BP 103/88 mmHg  Pulse 104  Temp(Src) 98.6 F (37 C) (Oral)  Resp 16  Ht  (1.651 m)  Wt 185 lb (83.915 kg)  BMI 30.79 kg/m2  SpO2 97%  LMP 04/19/2014 Physical Exam  Constitutional: She appears well-developed and well-nourished. No distress.  HENT:  Head: Normocephalic and atraumatic.  Mouth/Throat: Oropharynx is clear and moist. No oropharyngeal exudate.  Eyes: Conjunctivae are normal.  Neck: Neck supple.  Cardiovascular: Regular rhythm and intact distal pulses.  Tachycardia present.   Pulmonary/Chest: Effort normal. No respiratory distress. She has no decreased  breath sounds. She has wheezes ( mild) in the right middle field and the left middle field. She has no rhonchi. She has no rales. She exhibits no tenderness.  Abdominal: Soft. There is no tenderness.  Musculoskeletal: She exhibits no tenderness.  Lymphadenopathy:    She has no cervical adenopathy.  Neurological: She is alert.  Skin: Skin is warm and dry. No rash noted. She is not diaphoretic.  Psychiatric: She has a normal mood and affect.  Nursing note and vitals reviewed.   ED Course  Procedures  (including critical care time) Labs Review Labs Reviewed - No data to display  Imaging Review Dg Chest 2 View  04/28/2014   CLINICAL DATA:  Subacute onset of cough for 3 weeks. Initial encounter.  EXAM: CHEST  2 VIEW  COMPARISON:  Chest radiograph performed 08/05/2010  FINDINGS: The lungs are well-aerated. Mild vascular congestion is noted. There is no evidence of focal opacification, pleural effusion or pneumothorax.  The heart is normal in size; the mediastinal contour is within normal limits. No acute osseous abnormalities are seen.  IMPRESSION: Mild vascular congestion noted.  Lungs remain grossly clear.   Electronically Signed   By: Roanna RaiderJeffery  Chang M.D.   On: 04/28/2014 21:55     EKG Interpretation None      MDM   Final diagnoses:  Bronchitis   34 yo with URI symptoms and continued cough consistent with bronchitis. Her CXR is negative for acute infiltrate. Due to ongoing symptoms and multiple visits for same with treat with course of antibiotics and symptomatic treatment.  Pt is well-appearing, in no acute distress and vital signs reviewed and not concerning. She appears safe to be discharged.  Discharge include follow-up with their PCP.  Return precautions provided.   Verbalizes understanding and is agreeable with plan.   Filed Vitals:   04/28/14 2109 04/28/14 2329  BP: 103/88 122/74  Pulse: 104 101  Temp: 98.6 F (37 C) 98.9 F (37.2 C)  TempSrc: Oral Oral  Resp: 16 20  Height: 5\' 5"  (1.651 m)   Weight: 185 lb (83.915 kg)   SpO2: 97% 100%   Meds given in ED:  Medications  albuterol (PROVENTIL HFA;VENTOLIN HFA) 108 (90 BASE) MCG/ACT inhaler 2 puff (2 puffs Inhalation Given 04/28/14 2328)    Discharge Medication List as of 04/28/2014 11:20 PM    START taking these medications   Details  azithromycin (ZITHROMAX) 250 MG tablet Take 1 tablet (250 mg total) by mouth daily. Take first 2 tablets together, then 1 every day until finished., Starting 04/28/2014, Until  Discontinued, Print    benzonatate (TESSALON) 100 MG capsule Take 2 capsules (200 mg total) by mouth 2 (two) times daily as needed for cough., Starting 04/28/2014, Until Discontinued, Print           Harle BattiestElizabeth Cephus Tupy, NP 04/30/14 [id number]  Lorre NickAnthony Allen, MD 05/01/14 [phone]

## 2014-04-28 NOTE — ED Notes (Signed)
Pt presents with a dry cough and generalized fatigue X 3 weeks- admits to taking OTC medication without relief.  Respirations e/u.

## 2014-04-28 NOTE — Discharge Instructions (Signed)
Please follow the directions provided.  Use the resource guide or the referral provided to establish care with a primary care provider to follow-up.  Use the albuterol inhaler 2 puffs every 4 hours to help with coughing.  Use the tessalon perles and azithromycin as directed.  Don't hesitate to return for any new, worsening or concerning symptoms.     SEEK IMMEDIATE MEDICAL CARE IF:  You develop an increased fever or chills.  You have chest pain.  You have severe shortness of breath.  You have bloody sputum.  You develop dehydration.  You faint or repeatedly feel like you are going to pass out.  You develop repeated vomiting.  You develop a severe headache.

## 2014-05-17 ENCOUNTER — Emergency Department (INDEPENDENT_AMBULATORY_CARE_PROVIDER_SITE_OTHER)
Admission: EM | Admit: 2014-05-17 | Discharge: 2014-05-17 | Disposition: A | Discharge status: Home / Self Care | Payer: Medicaid Other | Source: Home / Self Care | Attending: Family Medicine | Admitting: Family Medicine

## 2014-05-17 ENCOUNTER — Encounter (HOSPITAL_COMMUNITY): Payer: Self-pay | Admitting: *Deleted

## 2014-05-17 ENCOUNTER — Other Ambulatory Visit (HOSPITAL_COMMUNITY)
Admission: RE | Admit: 2014-05-17 | Discharge: 2014-05-17 | Disposition: A | Discharge status: Home / Self Care | Payer: Medicaid Other | Source: Ambulatory Visit | Attending: Family Medicine | Admitting: Family Medicine

## 2014-05-17 DIAGNOSIS — N390 Urinary tract infection, site not specified: Secondary | ICD-10-CM

## 2014-05-17 DIAGNOSIS — N760 Acute vaginitis: Secondary | ICD-10-CM | POA: Insufficient documentation

## 2014-05-17 DIAGNOSIS — Z113 Encounter for screening for infections with a predominantly sexual mode of transmission: Secondary | ICD-10-CM | POA: Insufficient documentation

## 2014-05-17 LAB — POCT URINALYSIS DIP (DEVICE)
Bilirubin Urine: NEGATIVE
Glucose, UA: NEGATIVE mg/dL
Hgb urine dipstick: NEGATIVE
Ketones, ur: NEGATIVE mg/dL
Leukocytes, UA: NEGATIVE
Nitrite: NEGATIVE
Protein, ur: NEGATIVE mg/dL
Specific Gravity, Urine: 1.015 (ref 1.005–1.030)
Urobilinogen, UA: 0.2 mg/dL (ref 0.0–1.0)
pH: 7.5 (ref 5.0–8.0)

## 2014-05-17 LAB — POCT PREGNANCY, URINE: Preg Test, Ur: NEGATIVE

## 2014-05-17 MED ORDER — CEPHALEXIN 500 MG PO CAPS: 500.0000 mg | ORAL_CAPSULE | Freq: Four times a day (QID) | ORAL | Status: DC

## 2014-05-17 NOTE — ED Provider Notes (Signed)
CSN: [id number]     Arrival date & time 05/17/14  0827 History   First MD Initiated Contact with Patient 05/17/14 0845     Chief Complaint  Patient presents with  . Urinary Frequency   (Consider location/radiation/quality/duration/timing/severity/associated sxs/prior Treatment) Patient is a 34 y.o. female presenting with frequency. The history is provided by the patient.  Urinary Frequency This is a new problem. The current episode started more than 2 days ago. The problem has not changed since onset.Pertinent negatives include no chest pain and no abdominal pain.    Past Medical History  Diagnosis Date  . No pertinent past medical history   . NVD (normal vaginal delivery) 09/20/2010  . Breast feeding status of mother     breast feeds 56 month old  . Medical history non-contributory    Past Surgical History  Procedure Laterality Date  . No past surgeries     Family History  Problem Relation Age of Onset  . Diabetes Mother   . Hypertension Mother   . Diabetes Father   . Hypertension Father   . Diabetes Maternal Grandmother   . Hypertension Maternal Grandmother   . Diabetes Maternal Grandfather   . Hypertension Maternal Grandfather   . Diabetes Paternal Grandmother   . Hypertension Paternal Grandmother   . Diabetes Paternal Grandfather   . Hypertension Paternal Grandfather    History  Substance Use Topics  . Smoking status: Never Smoker   . Smokeless tobacco: Never Used  . Alcohol Use: No   OB History    Gravida Para Term Preterm AB TAB SAB Ectopic Multiple Living   0 0 0 0 0 0 4     Review of Systems  Constitutional: Negative.   Cardiovascular: Negative for chest pain.  Gastrointestinal: Negative.  Negative for abdominal pain.  Genitourinary: Positive for dysuria, urgency and frequency. Negative for vaginal bleeding, vaginal discharge and menstrual problem.    Allergies  Review of patient's allergies indicates no known allergies.  Home Medications    Prior to Admission medications   Medication Sig Start Date End Date Taking? Authorizing Provider  azithromycin (ZITHROMAX) 250 MG tablet Take 1 tablet (250 mg total) by mouth daily. Take first 2 tablets together, then 1 every day until finished. 04/28/14   Harle Battiest, NP  benzonatate (TESSALON) 100 MG capsule Take 2 capsules (200 mg total) by mouth 2 (two) times daily as needed for cough. 04/28/14   Harle Battiest, NP  cephALEXin (KEFLEX) 500 MG capsule Take 1 capsule (500 mg total) by mouth 4 (four) times daily. Take all of medicine and drink lots of fluids 05/17/14   Linna Hoff, MD  guaiFENesin-codeine 100-10 MG/5ML syrup Take 5 mLs by mouth 3 (three) times daily as needed for cough. 04/18/14   Duane Boston Clark, PA-C  HYDROcodone-homatropine [facility]) 5-1.5 MG/5ML syrup Take 5 mLs by mouth every 6 (six) hours as needed for cough. Patient not taking: Reported on 04/28/2014 04/10/14   Bertram Denver, PA-C  ibuprofen (ADVIL,MOTRIN) 200 MG tablet Take 4 tablets (800 mg total) by mouth every 8 (eight) hours as needed for pain (back pain). Patient not taking: Reported on 04/28/2014 05/29/12   Archie Patten, CNM  Multiple Vitamins-Minerals (MULTIVITAMIN WITH MINERALS) tablet Take 1 tablet by mouth daily.      Historical Provider, MD   BP 113/80 mmHg  Pulse 108  Temp(Src) 98.2 F (36.8 C) (Oral)  Resp 16  SpO2 98%  LMP 04/19/2014 Physical Exam  Constitutional: She is oriented to person, place, and time. She appears well-developed and well-nourished.  HENT:  Head: Normocephalic.  Abdominal: Soft. Bowel sounds are normal. She exhibits no mass. There is no tenderness.  Genitourinary: Vagina normal and uterus normal. No vaginal discharge found.  Neurological: She is alert and oriented to person, place, and time.  Skin: Skin is warm and dry.  Nursing note and vitals reviewed.   ED Course  Procedures (including critical care time) Labs Review Labs Reviewed  POCT  URINALYSIS DIP (DEVICE)  POCT PREGNANCY, URINE  CERVICOVAGINAL ANCILLARY ONLY  CERVICOVAGINAL ANCILLARY ONLY    Imaging Review No results found.   MDM   1. UTI (lower urinary tract infection)        Linna HoffJames D Carrol Bondar, MD 05/21/14 [phone]

## 2014-05-17 NOTE — Discharge Instructions (Signed)
Take all of medicine as directed, drink lots of fluids, see your doctor if further problems. °

## 2014-05-17 NOTE — ED Notes (Signed)
Pt  Reports  Symptoms  Of  Urinary  Frequency  As  Well   As  Low  abd  Pain  /  Discomfort  With  Onset of    Symptoms  Yesterday     Pt  Also  Reports  Some  Hematuria      And  Some  Discharge  X sev  Days

## 2014-05-17 NOTE — ED Notes (Signed)
Called pt no answer °

## 2014-05-17 NOTE — ED Notes (Signed)
Patient left before the blood could be drawn

## 2014-05-18 LAB — CERVICOVAGINAL ANCILLARY ONLY
Chlamydia: NEGATIVE
NEISSERIA GONORRHEA: NEGATIVE
WET PREP (BD AFFIRM): NEGATIVE

## 2014-07-31 ENCOUNTER — Emergency Department (INDEPENDENT_AMBULATORY_CARE_PROVIDER_SITE_OTHER)
Admission: EM | Admit: 2014-07-31 | Discharge: 2014-07-31 | Disposition: A | Discharge status: Home / Self Care | Payer: Medicaid Other | Source: Home / Self Care | Attending: Family Medicine | Admitting: Family Medicine

## 2014-07-31 DIAGNOSIS — J069 Acute upper respiratory infection, unspecified: Secondary | ICD-10-CM | POA: Diagnosis not present

## 2014-07-31 DIAGNOSIS — B9789 Other viral agents as the cause of diseases classified elsewhere: Principal | ICD-10-CM

## 2014-07-31 DIAGNOSIS — H109 Unspecified conjunctivitis: Secondary | ICD-10-CM | POA: Diagnosis not present

## 2014-07-31 MED ORDER — OLOPATADINE HCL 0.2 % OP SOLN: 1.0000 [drp] | Freq: Every day | OPHTHALMIC | Status: DC

## 2014-07-31 MED ORDER — BENZONATATE 100 MG PO CAPS: 100.0000 mg | ORAL_CAPSULE | Freq: Three times a day (TID) | ORAL | Status: DC | PRN

## 2014-07-31 MED ORDER — GUAIFENESIN-CODEINE 100-10 MG/5ML PO SOLN: 5.0000 mL | Freq: Four times a day (QID) | ORAL | Status: DC | PRN

## 2014-07-31 MED ORDER — IPRATROPIUM BROMIDE 0.06 % NA SOLN: 2.0000 | Freq: Four times a day (QID) | NASAL | Status: DC

## 2014-07-31 NOTE — ED Notes (Signed)
Pt states that her whole body hurts along with cough and watery eyes since yesterday.

## 2014-07-31 NOTE — ED Provider Notes (Signed)
CSN: [id number]     Arrival date & time 07/31/14  1645 History   First MD Initiated Contact with Patient 07/31/14 1650     Chief Complaint  Patient presents with  . Cough  . Generalized Body Aches   (Consider location/radiation/quality/duration/timing/severity/associated sxs/prior Treatment) HPI 2 days ago developed Raynaud's, cough, congestion and body aches. Today with throat irritation. Symptoms are intermittent and getting worse. Ibuprofen without relief. Cough keeps patient up at night. No other sick contacts. Denies chest pain, shortness breath, palpitations, neck stiffness, headache, dysuria, frequency, back pain, abdominal pain,   Past Medical History  Diagnosis Date  . No pertinent past medical history   . NVD (normal vaginal delivery) 09/20/2010  . Breast feeding status of mother     breast feeds 83 month old  . Medical history non-contributory    Past Surgical History  Procedure Laterality Date  . No past surgeries     Family History  Problem Relation Age of Onset  . Diabetes Mother   . Hypertension Mother   . Diabetes Father   . Hypertension Father   . Diabetes Maternal Grandmother   . Hypertension Maternal Grandmother   . Diabetes Maternal Grandfather   . Hypertension Maternal Grandfather   . Diabetes Paternal Grandmother   . Hypertension Paternal Grandmother   . Diabetes Paternal Grandfather   . Hypertension Paternal Grandfather    History  Substance Use Topics  . Smoking status: Never Smoker   . Smokeless tobacco: Never Used  . Alcohol Use: No   OB History    Gravida Para Term Preterm AB TAB SAB Ectopic Multiple Living   4 4 4  0 0 0 0 0 0 4     Review of Systems Per HPI with all other pertinent systems negative.    Allergies  Review of patient's allergies indicates no known allergies.  Home Medications   Prior to Admission medications   Medication Sig Start Date End Date Taking? Authorizing Provider  azithromycin (ZITHROMAX) 250 MG tablet Take  1 tablet (250 mg total) by mouth daily. Take first 2 tablets together, then 1 every day until finished. 04/28/14   Harle Battiest, NP  benzonatate (TESSALON PERLES) 100 MG capsule Take 1-2 capsules (100-200 mg total) by mouth 3 (three) times daily as needed for cough. 07/31/14   Ozella Rocks, MD  guaiFENesin-codeine 100-10 MG/5ML syrup Take 5-10 mLs by mouth every 6 (six) hours as needed for cough. 07/31/14   Ozella Rocks, MD  ibuprofen (ADVIL,MOTRIN) 200 MG tablet Take 4 tablets (800 mg total) by mouth every 8 (eight) hours as needed for pain (back pain). Patient not taking: Reported on 04/28/2014 05/29/12   Archie Patten, CNM  ipratropium (ATROVENT) 0.06 % nasal spray Place 2 sprays into both nostrils 4 (four) times daily. 07/31/14   Ozella Rocks, MD  Multiple Vitamins-Minerals (MULTIVITAMIN WITH MINERALS) tablet Take 1 tablet by mouth daily.      Historical Provider, MD  Olopatadine HCl 0.2 % SOLN Apply 1 drop to eye daily. 07/31/14   Ozella Rocks, MD   BP 119/66 mmHg  Pulse 106  Temp(Src) 99.3 F (37.4 C) (Oral)  Resp 18  SpO2 96% Physical Exam Physical Exam  Constitutional: oriented to person, place, and time. appears well-developed and well-nourished. No distress.  HENT:  Clear nasal discharge, Oropharynx clear, Head: Normocephalic and atraumatic.  Eyes: EOMI. PERRL.  Neck: Normal range of motion.  Cardiovascular: RRR, no m/r/g, 2+ distal pulses,  Pulmonary/Chest: Effort normal  and breath sounds normal. No respiratory distress.  Abdominal: Soft. Bowel sounds are normal. NonTTP, no distension.  Musculoskeletal: Normal range of motion. Non ttp, no effusion.  Neurological: alert and oriented to person, place, and time.  Skin: Skin is warm. No rash noted. non diaphoretic.  Psychiatric: normal mood and affect. behavior is normal. Judgment and thought content normal.    ED Course  Procedures (including critical care time) Labs Review Labs Reviewed - No data to  display  Imaging Review No results found.   MDM   1. Viral URI with cough   2. Bilateral conjunctivitis    Nasal Atrovent, Tessalon Perles during the day and Robitussin-AC at night, continue ibuprofen, Pataday for viral conjunctivitis.    Ozella Rocks, MD 07/31/14 [phone]

## 2014-07-31 NOTE — Discharge Instructions (Signed)
Your symptoms are likely due to a viral infection causing runny nose, cough, sore throat and eye irritation. Please use the Pataday for URI irritation. Please use the Robitussin before meals for nighttime cough. This will make you drowsy. Please use the Tessalon Perles during the daytime for your cough. Please use the nasal Atrovent to help control your runny nose and post nasal drip. Please consider using a daily allergy pill such as Zyrtec.

## 2014-08-26 ENCOUNTER — Encounter (HOSPITAL_COMMUNITY): Payer: Self-pay | Admitting: Vascular Surgery

## 2014-08-26 ENCOUNTER — Emergency Department (HOSPITAL_COMMUNITY)
Admission: EM | Admit: 2014-08-26 | Discharge: 2014-08-26 | Disposition: A | Discharge status: Home / Self Care | Payer: Medicaid Other | Attending: Emergency Medicine | Admitting: Emergency Medicine

## 2014-08-26 DIAGNOSIS — Z79899 Other long term (current) drug therapy: Secondary | ICD-10-CM | POA: Diagnosis not present

## 2014-08-26 DIAGNOSIS — J029 Acute pharyngitis, unspecified: Secondary | ICD-10-CM | POA: Diagnosis not present

## 2014-08-26 DIAGNOSIS — Z792 Long term (current) use of antibiotics: Secondary | ICD-10-CM | POA: Insufficient documentation

## 2014-08-26 DIAGNOSIS — R490 Dysphonia: Secondary | ICD-10-CM | POA: Diagnosis present

## 2014-08-26 DIAGNOSIS — J040 Acute laryngitis: Secondary | ICD-10-CM

## 2014-08-26 MED ORDER — LIDOCAINE VISCOUS 2 % MT SOLN
15.0000 mL | Freq: Once | OROMUCOSAL | Status: AC
  Administered 2014-08-26: 15 mL via OROMUCOSAL
  Filled 2014-08-26: qty 15

## 2014-08-26 MED ORDER — LIDOCAINE VISCOUS 2 % MT SOLN: 20.0000 mL | OROMUCOSAL | Status: DC | PRN

## 2014-08-26 NOTE — ED Notes (Signed)
Pt reports to the ED for eval of hoarse voice that started this am. She reports that for the past week she has been waking up and her voice was hoarse but today when she woke up it was completely gone. Denies any pain. Voice present but hoarse. Throat pink and not swollen. Pt A&Ox4, resp e/u, and skin warm and dry.

## 2014-08-26 NOTE — Discharge Instructions (Signed)
Take the prescribed medication as directed.  Rest your voice over the next few days. Follow-up with your primary care physician. Return to the ED for new or worsening symptoms.   Laryngitis At the top of your windpipe is your voice box. It is the source of your voice. Inside your voice box are 2 bands of muscles called vocal cords. When you breathe, your vocal cords are relaxed and open so that air can get into the lungs. When you decide to say something, these cords come together and vibrate. The sound from these vibrations goes into your throat and comes out through your mouth as sound. Laryngitis is an inflammation of the vocal cords that causes hoarseness, cough, loss of voice, sore throat, and dry throat. Laryngitis can be temporary (acute) or long-term (chronic). Most cases of acute laryngitis improve with time.Chronic laryngitis lasts for more than 3 weeks. CAUSES Laryngitis can often be related to excessive smoking, talking, or yelling, as well as inhalation of toxic fumes and allergies. Acute laryngitis is usually caused by a viral infection, vocal strain, measles or mumps, or bacterial infections. Chronic laryngitis is usually caused by vocal cord strain, vocal cord injury, postnasal drip, growths on the vocal cords, or acid reflux. SYMPTOMS   Cough.  Sore throat.  Dry throat. RISK FACTORS  Respiratory infections.  Exposure to irritating substances, such as cigarette smoke, excessive amounts of alcohol, stomach acids, and workplace chemicals.  Voice trauma, such as vocal cord injury from shouting or speaking too loud. DIAGNOSIS  Your cargiver will perform a physical exam. During the physical exam, your caregiver will examine your throat. The most common sign of laryngitis is hoarseness. Laryngoscopy may be necessary to confirm the diagnosis of this condition. This procedure allows your caregiver to look into the larynx. HOME CARE INSTRUCTIONS  Drink enough fluids to keep your  urine clear or pale yellow.  Rest until you no longer have symptoms or as directed by your caregiver.  Breathe in moist air.  Take all medicine as directed by your caregiver.  Do not smoke.  Talk as little as possible (this includes whispering).  Write on paper instead of talking until your voice is back to normal.  Follow up with your caregiver if your condition has not improved after 10 days. SEEK MEDICAL CARE IF:   You have trouble breathing.  You cough up blood.  You have persistent fever.  You have increasing pain.  You have difficulty swallowing. MAKE SURE YOU:  Understand these instructions.  Will watch your condition.  Will get help right away if you are not doing well or get worse. Document Released: 01/21/2005 Document Revised: 04/15/2011 Document Reviewed: 03/29/2010 [facility] Patient Information 2015 [place], [place]. This information is not intended to replace advice given to you by your health care provider. Make sure you discuss any questions you have with your health care provider.

## 2014-08-26 NOTE — ED Provider Notes (Signed)
CSN: [id number]     Arrival date & time 08/26/14  4098 History  This chart was scribed for non-physician practitioner, Garlon Hatchet, PA-C working with Doug Sou, MD by Placido Sou, ED scribe. This patient was seen in room TR07C/TR07C and the patient's care was started at 9:49 AM.   Chief Complaint  Patient presents with  . Hoarse   The history is provided by the patient. No language interpreter was used.   HPI Comments: Elizabeth Warren is a 34 y.o. female who presents to the Emergency Department complaining of an intermittent, moderate, sore throat with onset 1 week ago with a recent worsening of her symptoms. She notes an associated voice change and trouble sleeping. States usually her voice is hoarse in the morning and gets better but today her voice has not come back.  She denies difficulty swallowing or breathing.  No fever, chills, sweats.    Past Medical History  Diagnosis Date  . No pertinent past medical history   . NVD (normal vaginal delivery) 09/20/2010  . Breast feeding status of mother     breast feeds 53 month old  . Medical history non-contributory    Past Surgical History  Procedure Laterality Date  . No past surgeries     Family History  Problem Relation Age of Onset  . Diabetes Mother   . Hypertension Mother   . Diabetes Father   . Hypertension Father   . Diabetes Maternal Grandmother   . Hypertension Maternal Grandmother   . Diabetes Maternal Grandfather   . Hypertension Maternal Grandfather   . Diabetes Paternal Grandmother   . Hypertension Paternal Grandmother   . Diabetes Paternal Grandfather   . Hypertension Paternal Grandfather    History  Substance Use Topics  . Smoking status: Never Smoker   . Smokeless tobacco: Never Used  . Alcohol Use: No   OB History    Gravida Para Term Preterm AB TAB SAB Ectopic Multiple Living   0 0 0 0 0 0 4     Review of Systems  Constitutional: Negative for fever and chills.  HENT: Positive for sore  throat and voice change.   All other systems reviewed and are negative.  Allergies  Review of patient's allergies indicates no known allergies.  Home Medications   Prior to Admission medications   Medication Sig Start Date End Date Taking? Authorizing Provider  azithromycin (ZITHROMAX) 250 MG tablet Take 1 tablet (250 mg total) by mouth daily. Take first 2 tablets together, then 1 every day until finished. 04/28/14   Harle Battiest, NP  benzonatate (TESSALON PERLES) 100 MG capsule Take 1-2 capsules (100-200 mg total) by mouth 3 (three) times daily as needed for cough. 07/31/14   Ozella Rocks, MD  guaiFENesin-codeine 100-10 MG/5ML syrup Take 5-10 mLs by mouth every 6 (six) hours as needed for cough. 07/31/14   Ozella Rocks, MD  ibuprofen (ADVIL,MOTRIN) 200 MG tablet Take 4 tablets (800 mg total) by mouth every 8 (eight) hours as needed for pain (back pain). Patient not taking: Reported on 04/28/2014 05/29/12   Archie Patten, CNM  ipratropium (ATROVENT) 0.06 % nasal spray Place 2 sprays into both nostrils 4 (four) times daily. 07/31/14   Ozella Rocks, MD  Multiple Vitamins-Minerals (MULTIVITAMIN WITH MINERALS) tablet Take 1 tablet by mouth daily.      Historical Provider, MD  Olopatadine HCl 0.2 % SOLN Apply 1 drop to eye daily. 07/31/14   Ozella Rocks,  MD   BP 127/78 mmHg  Pulse 94  Temp(Src) 98.1 F (36.7 C) (Oral)  Resp 18  SpO2 100%   Physical Exam  Constitutional: She is oriented to person, place, and time. She appears well-developed and well-nourished.  HENT:  Head: Normocephalic and atraumatic.  Mouth/Throat: Uvula is midline, oropharynx is clear and moist and mucous membranes are normal. No oropharyngeal exudate, posterior oropharyngeal edema, posterior oropharyngeal erythema or tonsillar abscesses.  Hoarse voice without stridor; Tonsils normal in appearance bilaterally without exudate; uvula midline without peritonsillar abscess; handling secretions appropriately;  no difficulty swallowing  Eyes: Conjunctivae and EOM are normal. Pupils are equal, round, and reactive to light.  Neck: Normal range of motion.  Cardiovascular: Normal rate, regular rhythm and normal heart sounds.   Pulmonary/Chest: Effort normal and breath sounds normal.  Abdominal: Soft. Bowel sounds are normal.  Musculoskeletal: Normal range of motion.  Neurological: She is alert and oriented to person, place, and time.  Skin: Skin is warm and dry.  Psychiatric: She has a normal mood and affect.  Nursing note and vitals reviewed.   ED Course  Procedures  DIAGNOSTIC STUDIES: Oxygen Saturation is 100% on RA, normal by my interpretation.    COORDINATION OF CARE: 9:51 AM Discussed treatment plan with pt at bedside and pt agreed to plan.  Labs Review Labs Reviewed - No data to display  Imaging Review No results found.   EKG Interpretation None      MDM   Final diagnoses:  Laryngitis   34 year old female with intermittent hoarseness of voice over the past week. Patient is afebrile, nontoxic. Her voice is hoarse here today without stridor. She has no clinical signs of strep throat. She is handling her secretions well, no difficulty swallowing. Suspect noninfectious laryngitis. Patient reassured, discussed supportive care at home including vocal cord rest.  Given viscous lidocaine Rx if needed.  Discussed plan with patient, he/she acknowledged understanding and agreed with plan of care.  Return precautions given for new or worsening symptoms.  I personally performed the services described in this documentation, which was scribed in my presence. The recorded information has been reviewed and is accurate.  Garlon Hatchet, PA-C 08/26/14 1019  Doug Sou, MD 08/26/14 1531

## 2015-01-17 ENCOUNTER — Encounter: Payer: Medicaid Other | Admitting: Family Medicine

## 2015-01-18 ENCOUNTER — Ambulatory Visit (INDEPENDENT_AMBULATORY_CARE_PROVIDER_SITE_OTHER): Payer: Medicaid Other | Admitting: Family Medicine

## 2015-01-18 ENCOUNTER — Other Ambulatory Visit (HOSPITAL_COMMUNITY)
Admission: RE | Admit: 2015-01-18 | Discharge: 2015-01-18 | Disposition: A | Discharge status: Home / Self Care | Payer: Medicaid Other | Source: Ambulatory Visit | Attending: Family Medicine | Admitting: Family Medicine

## 2015-01-18 ENCOUNTER — Encounter: Payer: Self-pay | Admitting: Family Medicine

## 2015-01-18 VITALS — BP 124/82 | HR 108 | Temp 98.2°F | Ht 65.0 in | Wt 184.0 lb

## 2015-01-18 DIAGNOSIS — Z1151 Encounter for screening for human papillomavirus (HPV): Secondary | ICD-10-CM | POA: Diagnosis not present

## 2015-01-18 DIAGNOSIS — R5383 Other fatigue: Secondary | ICD-10-CM

## 2015-01-18 DIAGNOSIS — Z01419 Encounter for gynecological examination (general) (routine) without abnormal findings: Secondary | ICD-10-CM | POA: Diagnosis present

## 2015-01-18 DIAGNOSIS — Z124 Encounter for screening for malignant neoplasm of cervix: Secondary | ICD-10-CM

## 2015-01-18 DIAGNOSIS — L659 Nonscarring hair loss, unspecified: Secondary | ICD-10-CM | POA: Diagnosis present

## 2015-01-18 DIAGNOSIS — Z0000 Encounter for general adult medical examination without abnormal findings: Secondary | ICD-10-CM

## 2015-01-18 DIAGNOSIS — E669 Obesity, unspecified: Secondary | ICD-10-CM

## 2015-01-18 DIAGNOSIS — Z113 Encounter for screening for infections with a predominantly sexual mode of transmission: Secondary | ICD-10-CM | POA: Diagnosis present

## 2015-01-18 DIAGNOSIS — Z30431 Encounter for routine checking of intrauterine contraceptive device: Secondary | ICD-10-CM | POA: Diagnosis not present

## 2015-01-18 LAB — CBC
HEMATOCRIT: 39.4 % (ref 36.0–46.0)
Hemoglobin: 13.4 g/dL (ref 12.0–15.0)
MCH: 28.2 pg (ref 26.0–34.0)
MCHC: 34 g/dL (ref 30.0–36.0)
MCV: 82.8 fL (ref 78.0–100.0)
MPV: 10.2 fL (ref 8.6–12.4)
PLATELETS: 344 10*3/uL (ref 150–400)
RBC: 4.76 MIL/uL (ref 3.87–5.11)
RDW: 14.4 % (ref 11.5–15.5)
WBC: 10 10*3/uL (ref 4.0–10.5)

## 2015-01-18 LAB — TSH: TSH: 1.298 u[IU]/mL (ref 0.350–4.500)

## 2015-01-18 NOTE — Patient Instructions (Addendum)
We are going to check your thyroid level and for vitamin deficiency.  I want you to buy HAIR, SKIN and NAIL vitamins.  Take these daily.  Follow up with Dr Waynetta SandyWight in a couple of months.

## 2015-01-18 NOTE — Progress Notes (Signed)
Elizabeth Warren is a 34 y.o. female presents to office today for annual physical exam examination.  Concerns today include:  Hair Loss Patient reports concern regarding hair loss.  She notes that this has been a problem for about 8 years but that over the last 6 months her hair has been thinning more.  She reports good dietary intake, good sleep.  She endorses fatigue.  Denies constipation.  She does not take a MVI.  She reports that several women in her family experience this as well.  She notes that many of them seem to have hair loss following pregnancy.  Last pap smear: due, last 2007 and was negative Immunizations needed: Flu, TDap Refills needed today: none  Past Medical History  Diagnosis Date  . No pertinent past medical history   . NVD (normal vaginal delivery) 09/20/2010  . Breast feeding status of mother     breast feeds 386 month old  . Medical history non-contributory    Social History   Social History  . Marital Status: Married    Spouse Name: N/A  . Number of Children: N/A  . Years of Education: N/A   Occupational History  . Not on file.   Social History Main Topics  . Smoking status: Never Smoker   . Smokeless tobacco: Never Used  . Alcohol Use: No  . Drug Use: No  . Sexual Activity: Yes    Birth Control/ Protection: None, IUD   Other Topics Concern  . Not on file   Social History Narrative   Past Surgical History  Procedure Laterality Date  . No past surgeries     Family History  Problem Relation Age of Onset  . Diabetes Mother   . Hypertension Mother   . Diabetes Father   . Hypertension Father   . Diabetes Maternal Grandmother   . Hypertension Maternal Grandmother   . Diabetes Maternal Grandfather   . Hypertension Maternal Grandfather   . Diabetes Paternal Grandmother   . Hypertension Paternal Grandmother   . Diabetes Paternal Grandfather   . Hypertension Paternal Grandfather     ROS: Review of Systems Constitutional: positive for  fatigue Eyes: negative Ears, nose, mouth, throat, and face: negative Respiratory: negative Cardiovascular: negative Gastrointestinal: negative Genitourinary:negative Integument/breast: positive for breast tenderness and loss of hair Hematologic/lymphatic: negative Musculoskeletal:negative Neurological: negative Behavioral/Psych: negative Endocrine: negative Allergic/Immunologic: negative   Physical exam BP 124/82 mmHg  Pulse 108  Temp(Src) 98.2 F (36.8 C) (Oral)  Ht 5\' 5"  (1.651 m)  Wt 184 lb (83.462 kg)  BMI 30.62 kg/m2  SpO2 98%  LMP 01/18/2015 General appearance: alert, cooperative, appears stated age and no distress Head: Normocephalic, without obvious abnormality, atraumatic Eyes: conjunctivae/corneas clear. PERRL, EOM's intact. Fundi benign. Ears: normal TM's and external ear canals both ears Nose: Nares normal. Septum midline. Mucosa normal. No drainage or sinus tenderness. Throat: lips, mucosa, and tongue normal; teeth and gums normal Neck: no adenopathy, supple, symmetrical, trachea midline and thyroid not enlarged, symmetric, no tenderness/mass/nodules Back: symmetric, no curvature. ROM normal. No CVA tenderness. Lungs: clear to auscultation bilaterally Breasts: normal appearance, no masses or tenderness, Inspection negative, No nipple retraction or dimpling, No nipple discharge or bleeding, No axillary or supraclavicular adenopathy, Normal to palpation without dominant masses Heart: regular rate and rhythm, S1, S2 normal, no murmur, click, rub or gallop Abdomen: soft, non-tender; bowel sounds normal; no masses,  no organomegaly Pelvic: cervix normal in appearance, external genitalia normal, no adnexal masses or tenderness, no cervical motion  tenderness, positive findings: mild leukorrhea from cervical os and uterus normal size, shape, and consistency, IUD strings visualized Extremities: extremities normal, atraumatic, no cyanosis or edema Pulses: 2+ and  symmetric Skin: Skin color, texture, turgor normal. No rashes or lesions Lymph nodes: Cervical, supraclavicular, and axillary nodes normal. Neurologic: Grossly normal    Assessment/ Plan: Patient with obesity here for annual physical exam.   1. Annual physical exam - grossly normal exam - will obtain labs - Patient will need to update TDap, Flu.  This was not performed today  2. IUD check up - Strings visualized  3. Hair loss.  Likely some genetic/cultural component as several women in her family experience this after child birth.  Likely normal after pregnancy but will r/o thyroid or vitamin deficiency. - TSH - COMPLETE METABOLIC PANEL WITH GFR - Magnesium - Recommend hair, skin and nail vitamin daily. - Follow up with PCP as needed for this  4. Screening for cervical cancer - Cytology - PAP w/ HPV cotesting and GC/CT  5. Other fatigue. R/o anemia, thyroid disorder or electrolyte imbalance - CBC - TSH - COMPLETE METABOLIC PANEL WITH GFR  6. Obesity (BMI 30-39.9) - Encourage healthy food choices and daily exercise - Lipid panel - Will contact with results  Elizabeth Warren M. Nadine Counts, DO PGY-2, [facility]

## 2015-01-19 LAB — COMPLETE METABOLIC PANEL WITH GFR
ALK PHOS: 51 U/L (ref 33–115)
ALT: 14 U/L (ref 6–29)
AST: 16 U/L (ref 10–30)
Albumin: 4 g/dL (ref 3.6–5.1)
BUN: 10 mg/dL (ref 7–25)
CHLORIDE: 102 mmol/L (ref 98–110)
CO2: 28 mmol/L (ref 20–31)
Calcium: 9.5 mg/dL (ref 8.6–10.2)
Creat: 0.6 mg/dL (ref 0.50–1.10)
GFR, Est Non African American: >89 mL/min (ref >=60)
Glucose, Bld: 107 mg/dL — ABNORMAL HIGH (ref 65–99)
POTASSIUM: 3.8 mmol/L (ref 3.5–5.3)
Sodium: 138 mmol/L (ref 135–146)
Total Bilirubin: 0.2 mg/dL (ref 0.2–1.2)
Total Protein: 7.4 g/dL (ref 6.1–8.1)

## 2015-01-19 LAB — LIPID PANEL
CHOL/HDL RATIO: 4.8 ratio (ref <=5.0)
Cholesterol: 187 mg/dL (ref 125–200)
HDL: 39 mg/dL — AB (ref >=46)
LDL Cholesterol: 135 mg/dL — ABNORMAL HIGH (ref <130)
Triglycerides: 65 mg/dL (ref <150)
VLDL: 13 mg/dL (ref <30)

## 2015-01-19 LAB — MAGNESIUM: MAGNESIUM: 2 mg/dL (ref 1.5–2.5)

## 2015-01-19 LAB — CYTOLOGY - PAP

## 2015-01-20 ENCOUNTER — Encounter: Payer: Self-pay | Admitting: Family Medicine

## 2015-03-22 ENCOUNTER — Encounter (HOSPITAL_COMMUNITY): Payer: Self-pay | Admitting: *Deleted

## 2015-03-22 ENCOUNTER — Inpatient Hospital Stay (HOSPITAL_COMMUNITY)
Admission: AD | Admit: 2015-03-22 | Discharge: 2015-03-22 | Disposition: A | Discharge status: Home / Self Care | Payer: Medicaid Other | Source: Ambulatory Visit | Attending: Obstetrics & Gynecology | Admitting: Obstetrics & Gynecology

## 2015-03-22 DIAGNOSIS — R319 Hematuria, unspecified: Secondary | ICD-10-CM | POA: Insufficient documentation

## 2015-03-22 DIAGNOSIS — N390 Urinary tract infection, site not specified: Secondary | ICD-10-CM | POA: Insufficient documentation

## 2015-03-22 DIAGNOSIS — R109 Unspecified abdominal pain: Secondary | ICD-10-CM | POA: Diagnosis present

## 2015-03-22 DIAGNOSIS — R300 Dysuria: Secondary | ICD-10-CM | POA: Diagnosis not present

## 2015-03-22 DIAGNOSIS — N939 Abnormal uterine and vaginal bleeding, unspecified: Secondary | ICD-10-CM | POA: Diagnosis not present

## 2015-03-22 DIAGNOSIS — Z87440 Personal history of urinary (tract) infections: Secondary | ICD-10-CM | POA: Insufficient documentation

## 2015-03-22 DIAGNOSIS — M549 Dorsalgia, unspecified: Secondary | ICD-10-CM | POA: Diagnosis not present

## 2015-03-22 LAB — WET PREP, GENITAL
CLUE CELLS WET PREP: NONE SEEN
SPERM: NONE SEEN
Trich, Wet Prep: NONE SEEN
Yeast Wet Prep HPF POC: NONE SEEN

## 2015-03-22 LAB — URINALYSIS, ROUTINE W REFLEX MICROSCOPIC
Glucose, UA: NEGATIVE mg/dL
KETONES UR: 15 mg/dL — AB
Nitrite: POSITIVE — AB
Protein, ur: 100 mg/dL — AB
SPECIFIC GRAVITY, URINE: 1.02 (ref 1.005–1.030)
pH: 5.5 (ref 5.0–8.0)

## 2015-03-22 LAB — POCT PREGNANCY, URINE: Preg Test, Ur: NEGATIVE

## 2015-03-22 LAB — URINE MICROSCOPIC-ADD ON

## 2015-03-22 MED ORDER — IBUPROFEN 800 MG PO TABS: 800.0000 mg | ORAL_TABLET | Freq: Three times a day (TID) | ORAL | Status: DC | PRN

## 2015-03-22 MED ORDER — CEPHALEXIN 500 MG PO CAPS
500.0000 mg | ORAL_CAPSULE | Freq: Three times a day (TID) | ORAL | Status: DC
Start: 2015-03-22 — End: 2015-04-11

## 2015-03-22 NOTE — MAU Note (Signed)
?  Blood in urine, pain and pressure in suprapubic area, also with urination.  Pain in low back.  Hx of UTI

## 2015-03-22 NOTE — MAU Provider Note (Signed)
History     CSN: [id number]  Arrival date and time: 03/22/15 4098   None     Chief Complaint  Patient presents with  . Abdominal Pain  . Back Pain  . Dysuria   HPI Comments: Mrs. Brethauer is a 35 yo female with a pmh significant for UTI here today for abdominal pain, lower back pain, and burning with urination. She states that her symptoms started approx. 1 week ago. She describes the suprapubic pain as a cramping pain that is intermittent. It radiates to her lower back bilaterally. She rates it an 8/10 that waxes and wanes. Nothing makes better or worse. She endorses dysuria, hematuria, urgency, frequency. She is unsure if blood is coming from urine or if she is having vaginal bleeding. She denies discharge. Patient LMP was 1 week ago. She denies any fever, chills, N/V/D. Patient was treated for UTI approx 8 months ago. She states similar symptoms to prior UTI.  She is sexually active and does not use protection. Currently on Mirena for contraception.   OB History    Gravida Para Term Preterm AB TAB SAB Ectopic Multiple Living   0 0 0 0 0 0 4      Past Medical History  Diagnosis Date  . No pertinent past medical history   . NVD (normal vaginal delivery) 09/20/2010  . Breast feeding status of mother     breast feeds 110 month old  . Medical history non-contributory   . Infection     uti    Past Surgical History  Procedure Laterality Date  . No past surgeries      Family History  Problem Relation Age of Onset  . Diabetes Mother   . Hypertension Mother   . Diabetes Father   . Hypertension Father   . Diabetes Maternal Grandmother   . Hypertension Maternal Grandmother   . Diabetes Maternal Grandfather   . Hypertension Maternal Grandfather   . Diabetes Paternal Grandmother   . Hypertension Paternal Grandmother   . Diabetes Paternal Grandfather   . Hypertension Paternal Grandfather   . Cancer Neg Hx   . Heart disease Neg Hx   . Stroke Neg Hx     Social History   Substance Use Topics  . Smoking status: Never Smoker   . Smokeless tobacco: Never Used  . Alcohol Use: No    Allergies: No Known Allergies  Prescriptions prior to admission  Medication Sig Dispense Refill Last Dose  . ibuprofen (ADVIL,MOTRIN) 200 MG tablet Take 4 tablets (800 mg total) by mouth every 8 (eight) hours as needed for pain (back pain). (Patient not taking: Reported on 04/28/2014) 60 tablet 2 Completed Course at Unknown time  . ipratropium (ATROVENT) 0.06 % nasal spray Place 2 sprays into both nostrils 4 (four) times daily. 15 mL 12   . Multiple Vitamins-Minerals (MULTIVITAMIN WITH MINERALS) tablet Take 1 tablet by mouth daily.     04/28/2014 at Unknown time  . Olopatadine HCl 0.2 % SOLN Apply 1 drop to eye daily. 1 Bottle 0     Review of Systems  Respiratory: Negative for shortness of breath.    Physical Exam   Blood pressure 124/69, pulse 92, temperature 98.5 F (36.9 C), temperature source Oral, resp. rate 20, height 5' 2.5" (1.588 m), weight 84.913 kg (187 lb 3.2 oz), last menstrual period 03/13/2015.  Physical Exam  Constitutional: She is oriented to person, place, and time. She appears well-developed and well-nourished. No distress.  HENT:  Head: Normocephalic and atraumatic.  Eyes: Conjunctivae and EOM are normal.  Neck: Normal range of motion. Neck supple.  Cardiovascular: Normal rate, regular rhythm and normal heart sounds.   Respiratory: Effort normal and breath sounds normal.  GI: Soft. Bowel sounds are normal. She exhibits no distension. There is tenderness (Suprapubic tenderness to deep palpation). There is no rebound, no guarding and no CVA tenderness.  Genitourinary: Cervix exhibits no motion tenderness and no discharge. Right adnexum displays no mass and no tenderness. Left adnexum displays no mass and no tenderness. Vaginal discharge (blood and blood clots present in vaginal vault) found.  Musculoskeletal: Normal range of motion. She exhibits edema.   Neurological: She is alert and oriented to person, place, and time.  Skin: Skin is warm and dry.  Psychiatric: She has a normal mood and affect. Her behavior is normal.    MAU Course  Procedures  MDM UPT neg UA concerning for UTI with blood, nitrites and bacteria present  Pelvic exam due to large amount of blood and squamous cells present.   Vaginal bleeding present.  ?Irregular bleeding with IUD vs IUD expulsion   Assessment and Plan  1. UTI  P: Discharge to home F/u GYN in clinic to eval irregular bleeding Keflex rx Increase fluids Patient may return to MAU as needed or if her condition were to change or worsen   Demetrios Loll 03/22/2015, 9:18 AM

## 2015-03-22 NOTE — Discharge Instructions (Signed)

## 2015-03-23 LAB — GC/CHLAMYDIA PROBE AMP (~~LOC~~) NOT AT ARMC
CHLAMYDIA, DNA PROBE: NEGATIVE
NEISSERIA GONORRHEA: NEGATIVE

## 2015-04-11 ENCOUNTER — Encounter (HOSPITAL_COMMUNITY): Payer: Self-pay | Admitting: *Deleted

## 2015-04-11 ENCOUNTER — Inpatient Hospital Stay (HOSPITAL_COMMUNITY)
Admission: AD | Admit: 2015-04-11 | Discharge: 2015-04-11 | Disposition: A | Discharge status: Home / Self Care | Payer: Medicaid Other | Source: Ambulatory Visit | Attending: Obstetrics & Gynecology | Admitting: Obstetrics & Gynecology

## 2015-04-11 DIAGNOSIS — N6019 Diffuse cystic mastopathy of unspecified breast: Secondary | ICD-10-CM | POA: Insufficient documentation

## 2015-04-11 DIAGNOSIS — N6012 Diffuse cystic mastopathy of left breast: Secondary | ICD-10-CM

## 2015-04-11 DIAGNOSIS — N644 Mastodynia: Secondary | ICD-10-CM | POA: Diagnosis present

## 2015-04-11 DIAGNOSIS — N6489 Other specified disorders of breast: Secondary | ICD-10-CM | POA: Insufficient documentation

## 2015-04-11 DIAGNOSIS — N649 Disorder of breast, unspecified: Secondary | ICD-10-CM

## 2015-04-11 DIAGNOSIS — N6011 Diffuse cystic mastopathy of right breast: Secondary | ICD-10-CM

## 2015-04-11 DIAGNOSIS — L988 Other specified disorders of the skin and subcutaneous tissue: Secondary | ICD-10-CM

## 2015-04-11 NOTE — MAU Note (Signed)
Patient is complaining of bump on left breast to the left of the areola next to lateral vein.

## 2015-04-11 NOTE — Discharge Instructions (Signed)
Fibrocystic Breast Changes °Fibrocystic breast changes occur when breast ducts become blocked, causing painful, fluid-filled lumps (cysts) to form in the breast. This is a common condition that is noncancerous (benign). It occurs when women go through hormonal changes during their menstrual cycle. Fibrocystic breast changes can affect one or both breasts. °CAUSES  °The exact cause of fibrocystic breast changes is not known, but it may be related to the female hormones estrogen and progesterone. Family traits that get passed from parent to child (genetics) may also be a factor in some cases. °SIGNS AND SYMPTOMS  °· Tenderness, mild discomfort, or pain.   °· Swelling.   °· Rope-like feeling when touching the breast.   °· Lumpy breast, one or both sides.   °· Changes in breast size, especially before (larger) and after (smaller) the menstrual period.   °· Green or dark brown nipple discharge (not blood).   °Symptoms are usually worse before menstrual periods start and get better toward the end of the menstrual period.  °DIAGNOSIS  °To make a diagnosis, your health care provider will ask you questions and perform a physical exam of your breasts. The health care provider may recommend other tests that can examine inside your breasts, such as: °· A breast X-ray (mammogram).   °· Ultrasonography.  °· An MRI.   °If something more than fibrocystic breast changes is suspected, your health care provider may take a breast tissue sample (breast biopsy) to examine. °TREATMENT  °Often, treatment is not needed. Your health care provider may recommend over-the-counter pain relievers to help lessen pain or discomfort caused by the fibrocystic breast changes. You may also be asked to change your diet to limit or stop eating foods or drinking beverages that contain caffeine. Foods and beverages that contain caffeine include chocolate, soda, coffee, and tea. Reducing sugar and fat in your diet may also help. Your health care provider  may also recommend: °· Fine needle aspiration to remove fluid from a cyst that is causing pain.   °· Surgery to remove a large, persistent, and tender cyst. °HOME CARE INSTRUCTIONS  °· Examine your breasts after every menstrual period. If you do not have menstrual periods, check your breasts the first day of every month. Feel for changes, such as more tenderness, a new growth, a change in breast size, or a change in a lump that has always been there.   °· Only take over-the-counter or prescription medicine as directed by your health care provider.   °· Wear a well-fitted support or sports bra, especially when exercising.   °· Decrease or avoid caffeine, fat, and sugar in your diet as directed by your health care provider.   °SEEK MEDICAL CARE IF:  °· You have fluid leaking (discharge) from your nipples, especially bloody discharge.   °· You have new lumps or bumps in the breast.   °· Your breast or breasts become enlarged, red, and painful.   °· You have areas of your breast that pucker in.   °· Your nipples appear flat or indented.   °  °This information is not intended to replace advice given to you by your health care provider. Make sure you discuss any questions you have with your health care provider. °  °Document Released: 11/07/2005 Document Revised: 10/12/2014 Document Reviewed: 07/12/2012 °Elsevier Interactive Patient Education ©2016 [employer] ° ° °

## 2015-04-11 NOTE — MAU Note (Signed)
Pt C/O L breast pain that started today.  Denies vomiting, diarrhea, fever, cough or sore throat.

## 2015-04-11 NOTE — MAU Provider Note (Signed)
Chief Complaint: Breast Pain   First Provider Initiated Contact with Patient 04/11/15 1858      SUBJECTIVE HPI: Elizabeth Warren is a 35 y.o. [id number] who presents to maternity admissions reporting red bump on her left breast she noticed in the shower this morning.  She denies pain or itching with the bump.  She has not tried any treatments.  Nothing makes it better or worse.  It is unchanged since onset this morning. She denies vaginal bleeding, vaginal itching/burning, urinary symptoms, h/a, dizziness, n/v, or fever/chills.     HPI  Past Medical History  Diagnosis Date  . No pertinent past medical history   . NVD (normal vaginal delivery) 09/20/2010  . Breast feeding status of mother     breast feeds 24 month old  . Medical history non-contributory   . Infection     uti   Past Surgical History  Procedure Laterality Date  . No past surgeries     Social History   Social History  . Marital Status: Married    Spouse Name: N/A  . Number of Children: N/A  . Years of Education: N/A   Occupational History  . Not on file.   Social History Main Topics  . Smoking status: Never Smoker   . Smokeless tobacco: Never Used  . Alcohol Use: No  . Drug Use: No  . Sexual Activity: Yes    Birth Control/ Protection: None, IUD   Other Topics Concern  . Not on file   Social History Narrative   No current facility-administered medications on file prior to encounter.   Current Outpatient Prescriptions on File Prior to Encounter  Medication Sig Dispense Refill  . ibuprofen (ADVIL,MOTRIN) 200 MG tablet Take 400 mg by mouth every 6 (six) hours as needed for moderate pain.    . Multiple Vitamins-Minerals (MULTIVITAMIN WITH MINERALS) tablet Take 1 tablet by mouth daily.       No Known Allergies  ROS:  Review of Systems  Constitutional: Negative for fever, chills and fatigue.  Respiratory: Negative for shortness of breath.   Cardiovascular: Negative for chest pain.  Genitourinary:  Negative for dysuria, flank pain, vaginal bleeding, vaginal discharge, difficulty urinating, vaginal pain and pelvic pain.  Neurological: Negative for dizziness and headaches.  Psychiatric/Behavioral: Negative.      I have reviewed patient's Past Medical Hx, Surgical Hx, Family Hx, Social Hx, medications and allergies.   Physical Exam   Patient Vitals for the past 24 hrs:  BP Temp Temp src Pulse Resp  04/11/15 1811 126/86 mmHg 98.4 F (36.9 C) Oral 99 18   Constitutional: Well-developed, well-nourished female in no acute distress.  Cardiovascular: normal rate Respiratory: normal effort Breast: Small 0.5 cm slightly raised red smooth lesion with intact skin on left breast 3-4 cm to right of areola of left breast.  It is nontender to touch. GI: Abd soft, non-tender. Pos BS x 4 MS: Extremities nontender, no edema, normal ROM Neurologic: Alert and oriented x 4.  GU: Neg CVAT.   LAB RESULTS No results found for this or any previous visit (from the past 24 hour(s)).     IMAGING No results found.  MAU Management/MDM: Clinical breast exam with benign findings of bilateral fibrocystic breasts.  Small skin change of left breast appears benign also, possible mild contact dermatitis vs irritation from poorly fitting bra.  Precautions given/reasons to seek medical care.  Offered breast US if pt desired, but pt prefers to wait and will seek care if skin  lesion worsens or does not resolve or other symptoms develop.  Pt stable at time of discharge.  ASSESSMENT 1. Lesion of skin of breast   2. Fibrocystic breast changes of both breasts     PLAN Discharge home    Medication List    TAKE these medications        ibuprofen 200 MG tablet  Commonly known as:  ADVIL,MOTRIN  Take 400 mg by mouth every 6 (six) hours as needed for moderate pain.     multivitamin with minerals tablet  Take 1 tablet by mouth daily.       Follow-up Information    [facility]    Why:  For routine Gyn care, Return to MAU as needed for emergencies   Contact information:   [address] [place] [address] [phone]       Sharen CounterLisa Leftwich-Kirby Certified Nurse-Midwife 04/11/2015  7:24 PM

## 2015-04-16 ENCOUNTER — Inpatient Hospital Stay (HOSPITAL_COMMUNITY)
Admission: AD | Admit: 2015-04-16 | Discharge: 2015-04-16 | Disposition: A | Discharge status: Home / Self Care | Payer: Medicaid Other | Source: Ambulatory Visit | Attending: Obstetrics & Gynecology | Admitting: Obstetrics & Gynecology

## 2015-04-16 DIAGNOSIS — Z3202 Encounter for pregnancy test, result negative: Secondary | ICD-10-CM | POA: Insufficient documentation

## 2015-04-16 DIAGNOSIS — R300 Dysuria: Secondary | ICD-10-CM | POA: Diagnosis present

## 2015-04-16 LAB — URINALYSIS, ROUTINE W REFLEX MICROSCOPIC
BILIRUBIN URINE: NEGATIVE
GLUCOSE, UA: NEGATIVE mg/dL
Ketones, ur: NEGATIVE mg/dL
Leukocytes, UA: NEGATIVE
Nitrite: NEGATIVE
Protein, ur: NEGATIVE mg/dL
pH: 6.5 (ref 5.0–8.0)

## 2015-04-16 LAB — URINE MICROSCOPIC-ADD ON: BACTERIA UA: NONE SEEN

## 2015-04-16 LAB — POCT PREGNANCY, URINE: Preg Test, Ur: NEGATIVE

## 2015-04-16 MED ORDER — PHENAZOPYRIDINE HCL 200 MG PO TABS: 200.0000 mg | ORAL_TABLET | Freq: Three times a day (TID) | ORAL | Status: DC | PRN

## 2015-04-16 NOTE — MAU Provider Note (Signed)
Chief Complaint: Cystitis   First Provider Initiated Contact with Patient 04/16/15 1830     SUBJECTIVE HPI: Elizabeth Warren is a 35 y.o. [id number] female who presents to [facility] reporting dysuria, urgency and frequency since yesterday. States she had a UTI last month month and this feels the same. Is requesting an antibiotic.   Location: Urethra Quality: burning Severity: Moderate Duration: <24 hours Timing: Intermittent Modifying factors: None. Hasn't tried anything.  Associated signs and symptoms: Pos for urgency and frequency or urination, Neg for fever, chills, flank pain, N/V, vaginal discharge.   Past Medical History  Diagnosis Date  . No pertinent past medical history   . NVD (normal vaginal delivery) 09/20/2010  . Breast feeding status of mother     breast feeds 107 month old  . Medical history non-contributory   . Infection     uti   OB History  Gravida Para Term Preterm AB SAB TAB Ectopic Multiple Living  0 0 0 0 0 0 4    # Outcome Date GA Lbr Len/2nd Weight Sex Delivery Anes PTL Lv  4 Term 09/20/10 [redacted]w[redacted]d 15:45 / 00:19 8 lb 7.8 oz (3.85 kg) F Vag-Spont EPI  Y  3 Term           2 Term           1 Term              Past Surgical History  Procedure Laterality Date  . No past surgeries     Social History   Social History  . Marital Status: Married    Spouse Name: N/A  . Number of Children: N/A  . Years of Education: N/A   Occupational History  . Not on file.   Social History Main Topics  . Smoking status: Never Smoker   . Smokeless tobacco: Never Used  . Alcohol Use: No  . Drug Use: No  . Sexual Activity: Yes    Birth Control/ Protection: None, IUD   Other Topics Concern  . Not on file   Social History Narrative   No current facility-administered medications on file prior to encounter.   Current Outpatient Prescriptions on File Prior to Encounter  Medication Sig Dispense Refill  . ibuprofen (ADVIL,MOTRIN) 200 MG tablet Take 400  mg by mouth every 6 (six) hours as needed for moderate pain.    . Multiple Vitamins-Minerals (MULTIVITAMIN WITH MINERALS) tablet Take 1 tablet by mouth daily.       No Known Allergies  I have reviewed the past Medical Hx, Surgical Hx, Social Hx, Allergies and Medications.   Review of Systems  Constitutional: Negative for fever and chills.  Gastrointestinal: Negative for abdominal pain.  Genitourinary: Positive for dysuria, urgency and frequency. Negative for hematuria, flank pain and vaginal discharge.    OBJECTIVE Patient Vitals for the past 24 hrs:  BP Temp Pulse Resp  04/16/15 1729 124/81 mmHg 98.3 F (36.8 C) 100 18   Constitutional: Well-developed, well-nourished female in no acute distress.  Cardiovascular: normal rate Respiratory: normal rate and effort.  Neurologic: Alert and oriented x 4.  GU: Neg CVAT.   LAB RESULTS Results for orders placed or performed during the hospital encounter of 04/16/15 (from the past 24 hour(s))  Urinalysis, Routine w reflex microscopic (not at [facility])     Status: Abnormal   Collection Time: 04/16/15  5:25 PM  Result Value Ref Range   Color, Urine YELLOW YELLOW   APPearance CLEAR CLEAR  Specific Gravity, Urine <1.005 (L) 1.005 - 1.030   pH 6.5 5.0 - 8.0   Glucose, UA NEGATIVE NEGATIVE mg/dL   Hgb urine dipstick TRACE (A) NEGATIVE   Bilirubin Urine NEGATIVE NEGATIVE   Ketones, ur NEGATIVE NEGATIVE mg/dL   Protein, ur NEGATIVE NEGATIVE mg/dL   Nitrite NEGATIVE NEGATIVE   Leukocytes, UA NEGATIVE NEGATIVE  Urine microscopic-add on     Status: Abnormal   Collection Time: 04/16/15  5:25 PM  Result Value Ref Range   Squamous Epithelial / LPF 0-5 (A) NONE SEEN   WBC, UA 0-5 0 - 5 WBC/hpf   RBC / HPF 0-5 0 - 5 RBC/hpf   Bacteria, UA NONE SEEN NONE SEEN  Pregnancy, urine POC     Status: None   Collection Time: 04/16/15  5:37 PM  Result Value Ref Range   Preg Test, Ur NEGATIVE NEGATIVE    IMAGING No results found.  MAU COURSE UA  essentially neg. No evidence of UTI. Pt insistent of getting ABX. Explained that one is not indicated based of UA today. Will Rx Pyridium and send culture, but discussed other causes of urinary complaints and suggested that she F/U w/ PCP.  MDM - Dysuria, frequency, urgency w/ neg UA. ABX not indicated. No evidence of pyelo or kidney stones.   ASSESSMENT 1. Dysuria     PLAN Discharge home in stable condition. Rx Pyridium and increase fluids.  Follow-up Information    Follow up with Tawni CarnesAndrew Wight, MD.   Specialty:  Family Medicine   Why:  As needed if symptoms worsen   Contact information:   [address] [place] [id number] [phone]        Medication List    TAKE these medications        ibuprofen 200 MG tablet  Commonly known as:  ADVIL,MOTRIN  Take 400 mg by mouth every 6 (six) hours as needed for moderate pain.     multivitamin with minerals tablet  Take 1 tablet by mouth daily.     phenazopyridine 200 MG tablet  Commonly known as:  PYRIDIUM  Take 1 tablet (200 mg total) by mouth 3 (three) times daily as needed for pain.       [place] Marquasia Schmieder, [place] 04/16/2015  6:31 PM

## 2015-04-16 NOTE — Discharge Instructions (Signed)
Dysuria Dysuria is pain or discomfort while urinating. The pain or discomfort may be felt in the tube that carries urine out of the bladder (urethra) or in the surrounding tissue of the genitals. The pain may also be felt in the groin area, lower abdomen, and lower back. You may have to urinate frequently or have the sudden feeling that you have to urinate (urgency). Dysuria can affect both men and women, but is more common in women. Dysuria can be caused by many different things, including:  Urinary tract infection in women.  Infection of the kidney or bladder.  Interstitial Cystitis  Kidney stones or bladder stones.  Certain sexually transmitted infections (STIs), such as chlamydia.  Dehydration.  Inflammation of the vagina.  Use of certain medicines.  Use of certain soaps or scented products that cause irritation.  HOME CARE INSTRUCTIONS Watch your dysuria for any changes. The following actions may help to reduce any discomfort you are feeling:  Drink enough fluid to keep your urine clear or pale yellow.  Empty your bladder often. Avoid holding urine for long periods of time.  After a bowel movement or urination, women should cleanse from front to back, using each tissue only once.  Empty your bladder after sexual intercourse.  Take medicines only as directed by your health care provider.  If you were prescribed an antibiotic medicine, finish it all even if you start to feel better.  Avoid caffeine, tea, and alcohol. They can irritate the bladder and make dysuria worse. In men, alcohol may irritate the prostate.  Keep all follow-up visits as directed by your health care provider. This is important.  If you had any tests done to find the cause of dysuria, it is your responsibility to obtain your test results. Ask the lab or department performing the test when and how you will get your results. Talk with your health care provider if you have any questions about your  results. SEEK MEDICAL CARE IF:  You develop pain in your back or sides.  You have a fever.  You have nausea or vomiting.  You have blood in your urine.  You are not urinating as often as you usually do. SEEK IMMEDIATE MEDICAL CARE IF:  You pain is severe and not relieved with medicines.  You are unable to hold down any fluids.  You or someone else notices a change in your mental function.  You have a rapid heartbeat at rest.  You have shaking or chills.  You feel extremely weak.   This information is not intended to replace advice given to you by your health care provider. Make sure you discuss any questions you have with your health care provider.   Document Released: 10/20/2003 Document Revised: 02/11/2014 Document Reviewed: 09/16/2013 Elsevier Interactive Patient Education [employer]2016 Elsevier Inc.

## 2015-04-16 NOTE — MAU Note (Signed)
PT presents to MAU with complaints of urine frequency with painful urination. Denies any vaginal bleeding or abnormal discharge

## 2015-05-27 ENCOUNTER — Encounter (HOSPITAL_COMMUNITY): Payer: Self-pay | Admitting: *Deleted

## 2015-05-27 ENCOUNTER — Ambulatory Visit (HOSPITAL_COMMUNITY)
Admission: EM | Admit: 2015-05-27 | Discharge: 2015-05-27 | Disposition: A | Discharge status: Home / Self Care | Payer: Medicaid Other | Attending: Family Medicine | Admitting: Family Medicine

## 2015-05-27 DIAGNOSIS — H6122 Impacted cerumen, left ear: Secondary | ICD-10-CM

## 2015-05-27 NOTE — ED Provider Notes (Signed)
CSN: [id number]     Arrival date & time 05/27/15  1948 History   First MD Initiated Contact with Patient 05/27/15 1952     No chief complaint on file.  (Consider location/radiation/quality/duration/timing/severity/associated sxs/prior Treatment) Patient is a 35 y.o. female presenting with ear pain. The history is provided by the patient.  Otalgia Location:  Left Behind ear:  No abnormality Quality:  Dull and sore Severity:  Mild Onset quality:  Gradual Duration:  1 week Progression:  Worsening Chronicity:  New Context: no water in ear   Relieved by:  None tried Worsened by:  Nothing tried Ineffective treatments:  None tried Associated symptoms: no congestion, no ear discharge, no fever and no rhinorrhea     Past Medical History  Diagnosis Date  . No pertinent past medical history   . NVD (normal vaginal delivery) 09/20/2010  . Breast feeding status of mother     breast feeds 28 month old  . Medical history non-contributory   . Infection     uti   Past Surgical History  Procedure Laterality Date  . No past surgeries     Family History  Problem Relation Age of Onset  . Diabetes Mother   . Hypertension Mother   . Diabetes Father   . Hypertension Father   . Diabetes Maternal Grandmother   . Hypertension Maternal Grandmother   . Diabetes Maternal Grandfather   . Hypertension Maternal Grandfather   . Diabetes Paternal Grandmother   . Hypertension Paternal Grandmother   . Diabetes Paternal Grandfather   . Hypertension Paternal Grandfather   . Cancer Neg Hx   . Heart disease Neg Hx   . Stroke Neg Hx    Social History  Substance Use Topics  . Smoking status: Never Smoker   . Smokeless tobacco: Never Used  . Alcohol Use: No   OB History    Gravida Para Term Preterm AB TAB SAB Ectopic Multiple Living   0 0 0 0 0 0 4     Review of Systems  Constitutional: Negative.  Negative for fever.  HENT: Positive for ear pain. Negative for congestion, ear discharge and  rhinorrhea.   Eyes: Negative.   Respiratory: Negative.   All other systems reviewed and are negative.   Allergies  Review of patient's allergies indicates no known allergies.  Home Medications   Prior to Admission medications   Medication Sig Start Date End Date Taking? Authorizing Provider  ibuprofen (ADVIL,MOTRIN) 200 MG tablet Take 400 mg by mouth every 6 (six) hours as needed for moderate pain.    Historical Provider, MD  Multiple Vitamins-Minerals (MULTIVITAMIN WITH MINERALS) tablet Take 1 tablet by mouth daily.      Historical Provider, MD  phenazopyridine (PYRIDIUM) 200 MG tablet Take 1 tablet (200 mg total) by mouth 3 (three) times daily as needed for pain. 04/16/15   Dorathy Kinsman, CNM   Meds Ordered and Administered this Visit  Medications - No data to display  There were no vitals taken for this visit. No data found.   Physical Exam  Constitutional: She is oriented to person, place, and time. She appears well-developed and well-nourished.  HENT:  Right Ear: External ear normal.  Left Ear: Decreased hearing is noted.  Ears:  Mouth/Throat: Oropharynx is clear and moist.  Neck: Normal range of motion. Neck supple.  Lymphadenopathy:    She has no cervical adenopathy.  Neurological: She is alert and oriented to person, place, and time.  Skin: Skin is  warm and dry.  Nursing note and vitals reviewed.   ED Course  Procedures (including critical care time)  Labs Review Labs Reviewed - No data to display  Imaging Review No results found.   Visual Acuity Review  Right Eye Distance:   Left Eye Distance:   Bilateral Distance:    Right Eye Near:   Left Eye Near:    Bilateral Near:         MDM   1. Cerumen impaction, left    Left ear irrigated til clear, sx resolved, tm and canal nl, problem resolved.    Linna HoffJames D Blossie Raffel, MD 05/27/15 2012

## 2015-05-27 NOTE — Discharge Instructions (Signed)
No Qtips. Return if needed.

## 2015-05-27 NOTE — ED Notes (Signed)
Assessment per Dr. Kindl. 

## 2015-10-18 ENCOUNTER — Emergency Department (HOSPITAL_COMMUNITY)
Admission: EM | Admit: 2015-10-18 | Discharge: 2015-10-18 | Discharge status: Absent without leave | Payer: Medicaid Other

## 2015-10-18 ENCOUNTER — Ambulatory Visit (HOSPITAL_COMMUNITY)
Admission: EM | Admit: 2015-10-18 | Discharge: 2015-10-18 | Disposition: A | Discharge status: Home / Self Care | Payer: Medicaid Other

## 2015-10-18 NOTE — ED Notes (Signed)
Decided she didn't want to be seen immediately after registering

## 2015-10-20 ENCOUNTER — Emergency Department (HOSPITAL_COMMUNITY)
Admission: EM | Admit: 2015-10-20 | Discharge: 2015-10-20 | Disposition: A | Discharge status: Home / Self Care | Payer: Medicaid Other | Attending: Emergency Medicine | Admitting: Emergency Medicine

## 2015-10-20 ENCOUNTER — Encounter (HOSPITAL_COMMUNITY): Payer: Self-pay

## 2015-10-20 DIAGNOSIS — E119 Type 2 diabetes mellitus without complications: Secondary | ICD-10-CM | POA: Diagnosis not present

## 2015-10-20 DIAGNOSIS — J029 Acute pharyngitis, unspecified: Secondary | ICD-10-CM | POA: Insufficient documentation

## 2015-10-20 DIAGNOSIS — I10 Essential (primary) hypertension: Secondary | ICD-10-CM | POA: Diagnosis not present

## 2015-10-20 HISTORY — DX: Essential (primary) hypertension: I10

## 2015-10-20 LAB — RAPID STREP SCREEN (MED CTR MEBANE ONLY): Streptococcus, Group A Screen (Direct): NEGATIVE

## 2015-10-20 NOTE — Discharge Instructions (Signed)
Please read and follow all provided instructions.  Your diagnoses today include:  1. Viral pharyngitis    You appear to have an upper respiratory infection (URI). An upper respiratory tract infection, or cold, is a viral infection of the air passages leading to the lungs. It should improve gradually after 5-7 days. You may have a lingering cough that lasts for 2- 4 weeks after the infection.  Tests performed today include: Vital signs. See below for your results today.   Medications prescribed:   Take any prescribed medications only as directed. Treatment for your infection is aimed at treating the symptoms. There are no medications, such as antibiotics, that will cure your infection.   Home care instructions:  Follow any educational materials contained in this packet.   Your illness is contagious and can be spread to others, especially during the first 3 or 4 days. It cannot be cured by antibiotics or other medicines. Take basic precautions such as washing your hands often, covering your mouth when you cough or sneeze, and avoiding public places where you could spread your illness to others.   Please continue drinking plenty of fluids.  Use over-the-counter medicines as needed as directed on packaging for symptom relief.  You may also use ibuprofen or tylenol as directed on packaging for pain or fever.  Do not take multiple medicines containing Tylenol or acetaminophen to avoid taking too much of this medication.  Follow-up instructions: Please follow-up with your primary care provider in the next 3 days for further evaluation of your symptoms if you are not feeling better.   Return instructions:  Please return to the Emergency Department if you experience worsening symptoms.  RETURN IMMEDIATELY IF you develop shortness of breath, confusion or altered mental status, a new rash, become dizzy, faint, or poorly responsive, or are unable to be cared for at home. Please return if you have  persistent vomiting and cannot keep down fluids or develop a fever that is not controlled by tylenol or motrin.   Please return if you have any other emergent concerns.  Additional Information:  Your vital signs today were: BP 122/89 (BP Location: Right Arm)    Pulse 99    Temp 98.4 F (36.9 C) (Oral)    Resp 18    Ht 5\' 6"  (1.676 m)    Wt 86.2 kg    SpO2 98%    BMI 30.67 kg/m  If your blood pressure (BP) was elevated above 135/85 this visit, please have this repeated by your doctor within one month. --------------

## 2015-10-20 NOTE — ED Triage Notes (Signed)
Per Pt, Pt is coming from home with complaints of one week of generalized body aches and three days of fevers. Pt reports taking Ibuprofen with no relief. Reports Sore throat. Denies N/V/D

## 2015-10-20 NOTE — ED Provider Notes (Signed)
[facility]-EMERGENCY DEPT Provider Note   CSN: [id number] Arrival date & time: 10/20/15  0750  By signing my name below, I, Modena Jansky, attest that this documentation has been prepared under the direction and in the presence of non-physician practitioner, Audry Pili, PA-C. Electronically Signed: Modena Jansky, Scribe. 10/20/2015. 9:02 AM. History   Chief Complaint Chief Complaint  Patient presents with  . Sore Throat     The history is provided by the patient. No language interpreter was used.   HPI Comments: Elizabeth Warren is a 35 y.o. female who presents to the Emergency Department complaining of constant moderate sore throat that started a week ago. Associated symptoms include generalized myalgias and a fever of 100.1 over the past 3 days. Pt's temperature in the ED today was 98.4.  She has been taking 200mg  ibuprofen daily without any relief. Reports sick contacts with similar symptoms. Denies persistent abdominal pain, cough, ear ache, rhinorrhea, nausea , vomiting, diarrhea, or recent insect bites.   Past Medical History:  Diagnosis Date  . Breast feeding status of mother    breast feeds 68 month old  . Diabetes mellitus without complication (HCC)   . Hypertension   . Infection    uti  . Medical history non-contributory   . No pertinent past medical history   . NVD (normal vaginal delivery) 09/20/2010    Patient Active Problem List   Diagnosis Date Noted  . Obesity (BMI 30-39.9) 10/31/2011    Past Surgical History:  Procedure Laterality Date  . NO PAST SURGERIES      OB History    Gravida Para Term Preterm AB Living   4 4 4  0 0 4   SAB TAB Ectopic Multiple Live Births   0 0 0 0 1       Home Medications    Prior to Admission medications   Medication Sig Start Date End Date Taking? Authorizing Provider  ibuprofen (ADVIL,MOTRIN) 200 MG tablet Take 400 mg by mouth every 6 (six) hours as needed for moderate pain.    Historical Provider, MD  Multiple  Vitamins-Minerals (MULTIVITAMIN WITH MINERALS) tablet Take 1 tablet by mouth daily.      Historical Provider, MD  phenazopyridine (PYRIDIUM) 200 MG tablet Take 1 tablet (200 mg total) by mouth 3 (three) times daily as needed for pain. 04/16/15   Dorathy Kinsman, CNM    Family History Family History  Problem Relation Age of Onset  . Diabetes Mother   . Hypertension Mother   . Diabetes Father   . Hypertension Father   . Diabetes Maternal Grandmother   . Hypertension Maternal Grandmother   . Diabetes Maternal Grandfather   . Hypertension Maternal Grandfather   . Diabetes Paternal Grandmother   . Hypertension Paternal Grandmother   . Diabetes Paternal Grandfather   . Hypertension Paternal Grandfather   . Cancer Neg Hx   . Heart disease Neg Hx   . Stroke Neg Hx     Social History Social History  Substance Use Topics  . Smoking status: Never Smoker  . Smokeless tobacco: Never Used  . Alcohol use No     Allergies   Review of patient's allergies indicates no known allergies.   Review of Systems Review of Systems  Constitutional: Positive for fever.  HENT: Positive for sore throat. Negative for ear pain and rhinorrhea.   Respiratory: Negative for cough.   Gastrointestinal: Negative for abdominal pain, diarrhea, nausea and vomiting.     Physical Exam Updated Vital Signs BP  122/89 (BP Location: Right Arm)   Pulse 99   Temp 98.4 F (36.9 C) (Oral)   Resp 18   Ht 5\' 6"  (1.676 m)   Wt 190 lb (86.2 kg)   SpO2 98%   BMI 30.67 kg/m   Physical Exam  Constitutional: She is oriented to person, place, and time. She appears well-developed and well-nourished. No distress.  HENT:  Head: Normocephalic and atraumatic.  Right Ear: Hearing, tympanic membrane, external ear and ear canal normal.  Left Ear: Hearing, tympanic membrane, external ear and ear canal normal.  Nose: Nose normal.  Mouth/Throat: Uvula is midline, oropharynx is clear and moist and mucous membranes are normal.  No trismus in the jaw. No oropharyngeal exudate, posterior oropharyngeal edema, posterior oropharyngeal erythema or tonsillar abscesses.  Eyes: Conjunctivae and EOM are normal. Pupils are equal, round, and reactive to light.  Neck: Normal range of motion. Neck supple. No tracheal deviation present.  Cardiovascular: Normal rate, regular rhythm, S1 normal, S2 normal, normal heart sounds, intact distal pulses and normal pulses.   Pulmonary/Chest: Effort normal and breath sounds normal. No respiratory distress. She has no decreased breath sounds. She has no wheezes. She has no rhonchi. She has no rales.  Abdominal: Soft. Normal appearance and bowel sounds are normal. There is no tenderness.  Musculoskeletal: Normal range of motion.  Neurological: She is alert and oriented to person, place, and time.  Skin: Skin is warm and dry.  Psychiatric: She has a normal mood and affect. Her speech is normal and behavior is normal. Thought content normal.  Nursing note and vitals reviewed.    ED Treatments / Results  DIAGNOSTIC STUDIES: Oxygen Saturation is 98% on RA, normal by my interpretation.    COORDINATION OF CARE: 9:12 AM- Pt advised of plan for treatment and pt agrees.  Labs (all labs ordered are listed, but only abnormal results are displayed) Labs Reviewed  RAPID STREP SCREEN (NOT AT [facility])  CULTURE, GROUP A STREP [facility])    EKG  EKG Interpretation None       Radiology No results found.  Procedures Procedures (including critical care time)  Medications Ordered in ED Medications - No data to display   Initial Impression / Assessment and Plan / ED Course  I have reviewed the triage vital signs and the nursing notes.  Pertinent labs & imaging results that were available during my care of the patient were reviewed by me and considered in my medical decision making (see chart for details).  Clinical Course    Final Clinical Impressions(s) / ED Diagnoses  I have reviewed and  evaluated the relevant laboratory values I have reviewed and evaluated the relevant imaging studies. I obtained HPI from historian.  ED Course:  Assessment: Pt is a 35yF presents with URi x 1 week . On exam, pt in NAD. VSS. Afebrile. Lungs CTA, Heart RRR. Abdomen nontender/soft. Pt CXR negative for acute infiltrate. Patients symptoms are consistent with URI, likely viral etiology. Discussed that antibiotics are not indicated for viral infections. Pt will be discharged with symptomatic treatment.  Verbalizes understanding and is agreeable with plan. Pt is hemodynamically stable & in NAD prior to dc.  Disposition/Plan:  DC Home Additional Verbal discharge instructions given and discussed with patient.  Pt Instructed to f/u with PCP in the next week for evaluation and treatment of symptoms. Return precautions given Pt acknowledges and agrees with plan  Supervising Physician Maia Plan, MD   Final diagnoses:  Viral pharyngitis  New Prescriptions New Prescriptions   No medications on file  I personally performed the services described in this documentation, which was scribed in my presence. The recorded information has been reviewed and is accurate.     Audry Piliyler Ellery Meroney, PA-C 10/20/15 0920    Maia PlanJoshua G Long, MD 10/20/15 2010

## 2015-10-20 NOTE — ED Notes (Signed)
See PA note.

## 2015-10-22 LAB — CULTURE, GROUP A STREP (THRC)

## 2015-11-09 ENCOUNTER — Ambulatory Visit (INDEPENDENT_AMBULATORY_CARE_PROVIDER_SITE_OTHER): Payer: Medicaid Other | Admitting: Family Medicine

## 2015-11-09 VITALS — BP 115/74 | HR 88 | Temp 98.6°F | Ht 66.0 in | Wt 189.6 lb

## 2015-11-09 DIAGNOSIS — Z30433 Encounter for removal and reinsertion of intrauterine contraceptive device: Secondary | ICD-10-CM | POA: Diagnosis not present

## 2015-11-09 LAB — POCT URINE PREGNANCY: PREG TEST UR: NEGATIVE

## 2015-11-09 MED ORDER — ACETAMINOPHEN 500 MG PO TABS
500.0000 mg | ORAL_TABLET | Freq: Once | ORAL | Status: AC
  Administered 2015-11-09: 500 mg via ORAL

## 2015-11-09 MED ORDER — LEVONORGESTREL 20 MCG/24HR IU IUD
INTRAUTERINE_SYSTEM | Freq: Once | INTRAUTERINE | Status: AC
  Administered 2015-11-09: 1 via INTRAUTERINE

## 2015-11-09 NOTE — Progress Notes (Signed)
  Patient ID: Elizabeth OsmondZuhal B Warren, female   DOB: 04/06/1980, 35 y.o.   MRN: [id number]  IUD Removal & Insertion Procedure Note  Pre-operative Diagnosis: IUD removal and Insertion  Post-operative Diagnosis: same  Indications: contraception  Procedure Details  Urine pregnancy test was done today and result was negative.  The risks (including infection, bleeding, pain, and uterine perforation) and benefits of the procedure were explained to the patient and Written informed consent was obtained.    IUD string visualized and previous IUD was removed without difficulty.  Cervix cleansed with Betadine. Uterus sounded to 10 cm. IUD inserted without difficulty. String visible and trimmed. Patient tolerated procedure well.  IUD Information: Mirena, Lot # [id number], Expiration date 04/20.  Condition: Stable  Complications: None  Plan:  The patient was advised to call for any fever or for prolonged or severe pain or bleeding. She was advised to use OTC acetaminophen and one dose of tylenol was given after the procedure.   Attending Physician Documentation: I was present for or performed the following: IUD removal and reinsertion

## 2015-11-09 NOTE — Addendum Note (Signed)
Addended by: Clovis PuMARTIN, TAMIKA L on: 11/09/2015 10:52 AM   Modules accepted: Orders

## 2015-11-09 NOTE — Progress Notes (Signed)
poct

## 2015-11-09 NOTE — Addendum Note (Signed)
Addended by: Jone BasemanFLEEGER, Payne Garske D on: 11/09/2015 11:36 AM   Modules accepted: Orders

## 2015-11-09 NOTE — Patient Instructions (Signed)

## 2015-12-17 ENCOUNTER — Emergency Department (HOSPITAL_COMMUNITY)
Admission: EM | Admit: 2015-12-17 | Discharge: 2015-12-17 | Discharge status: Against Medical Advice | Payer: Medicaid Other | Attending: Emergency Medicine | Admitting: Emergency Medicine

## 2015-12-17 ENCOUNTER — Encounter (HOSPITAL_COMMUNITY): Payer: Self-pay | Admitting: *Deleted

## 2015-12-17 ENCOUNTER — Emergency Department (HOSPITAL_COMMUNITY): Payer: Medicaid Other

## 2015-12-17 DIAGNOSIS — M545 Low back pain, unspecified: Secondary | ICD-10-CM

## 2015-12-17 DIAGNOSIS — I10 Essential (primary) hypertension: Secondary | ICD-10-CM | POA: Insufficient documentation

## 2015-12-17 DIAGNOSIS — E119 Type 2 diabetes mellitus without complications: Secondary | ICD-10-CM | POA: Diagnosis not present

## 2015-12-17 DIAGNOSIS — R1030 Lower abdominal pain, unspecified: Secondary | ICD-10-CM | POA: Diagnosis present

## 2015-12-17 DIAGNOSIS — R102 Pelvic and perineal pain: Secondary | ICD-10-CM | POA: Insufficient documentation

## 2015-12-17 LAB — URINALYSIS, ROUTINE W REFLEX MICROSCOPIC
Bilirubin Urine: NEGATIVE
GLUCOSE, UA: NEGATIVE mg/dL
HGB URINE DIPSTICK: NEGATIVE
KETONES UR: NEGATIVE mg/dL
LEUKOCYTES UA: NEGATIVE
Nitrite: NEGATIVE
PROTEIN: NEGATIVE mg/dL
Specific Gravity, Urine: 1.003 — ABNORMAL LOW (ref 1.005–1.030)
pH: 7 (ref 5.0–8.0)

## 2015-12-17 LAB — POC URINE PREG, ED: PREG TEST UR: NEGATIVE

## 2015-12-17 LAB — I-STAT CHEM 8, ED
BUN: 6 mg/dL (ref 6–20)
Calcium, Ion: 1.11 mmol/L — ABNORMAL LOW (ref 1.15–1.40)
Chloride: 104 mmol/L (ref 101–111)
Creatinine, Ser: 0.4 mg/dL — ABNORMAL LOW (ref 0.44–1.00)
Glucose, Bld: 88 mg/dL (ref 65–99)
HEMATOCRIT: 38 % (ref 36.0–46.0)
HEMOGLOBIN: 12.9 g/dL (ref 12.0–15.0)
POTASSIUM: 3.7 mmol/L (ref 3.5–5.1)
SODIUM: 138 mmol/L (ref 135–145)
TCO2: 22 mmol/L (ref 0–100)

## 2015-12-17 LAB — CBC WITH DIFFERENTIAL/PLATELET
BASOS ABS: 0 10*3/uL (ref 0.0–0.1)
BASOS PCT: 0 %
EOS ABS: 0.2 10*3/uL (ref 0.0–0.7)
Eosinophils Relative: 2 %
HEMATOCRIT: 37.8 % (ref 36.0–46.0)
Hemoglobin: 12.7 g/dL (ref 12.0–15.0)
Lymphocytes Relative: 35 %
Lymphs Abs: 4.1 10*3/uL — ABNORMAL HIGH (ref 0.7–4.0)
MCH: 27.5 pg (ref 26.0–34.0)
MCHC: 33.6 g/dL (ref 30.0–36.0)
MCV: 82 fL (ref 78.0–100.0)
MONO ABS: 0.7 10*3/uL (ref 0.1–1.0)
MONOS PCT: 6 %
NEUTROS ABS: 6.7 10*3/uL (ref 1.7–7.7)
NEUTROS PCT: 57 %
Platelets: 320 10*3/uL (ref 150–400)
RBC: 4.61 MIL/uL (ref 3.87–5.11)
RDW: 13.9 % (ref 11.5–15.5)
WBC: 11.8 10*3/uL — ABNORMAL HIGH (ref 4.0–10.5)

## 2015-12-17 MED ORDER — KETOROLAC TROMETHAMINE 60 MG/2ML IM SOLN
30.0000 mg | Freq: Once | INTRAMUSCULAR | Status: AC
  Administered 2015-12-17: 30 mg via INTRAMUSCULAR
  Filled 2015-12-17: qty 2

## 2015-12-17 NOTE — ED Notes (Signed)
Patient transported to Ultrasound 

## 2015-12-17 NOTE — ED Notes (Signed)
PA at bedside.

## 2015-12-17 NOTE — ED Triage Notes (Signed)
Pt reports lower back and lower abd pain x 2 weeks but more severe since last night. reports frequent urination and only able to urinate small amounts each time.

## 2015-12-17 NOTE — ED Provider Notes (Signed)
[facility] DEPT Provider Note   CSN: [id number] Arrival date & time: 12/17/15  1841     History   Chief Complaint Chief Complaint  Patient presents with  . Abdominal Pain  . Back Pain    HPI Elizabeth Warren is a 35 y.o. female.  HPI Elizabeth Warren is a 35 y.o. female with history of hypertension, diabetes, presents to emergency department complaining of lower abdominal pain and back pain. Patient states symptoms started 2 weeks ago but worsened since yesterday. She reports dysuria, urinary frequency and urgency, reports that she is urinating small amounts at a time. She reports associated bilateral lower back pain. States back pain is worsened with movement, walking, changing positions. She states she also lifts heavy things daily. She denies any injuries. No pain radiating down her legs. No trouble urinating or control her bowels. No fever or chills. No nausea or vomiting. She has tried taking ibuprofen which has not helped.  Past Medical History:  Diagnosis Date  . Breast feeding status of mother    breast feeds 766 month old  . Diabetes mellitus without complication (HCC)   . Hypertension   . Infection    uti  . Medical history non-contributory   . No pertinent past medical history   . NVD (normal vaginal delivery) 09/20/2010    Patient Active Problem List   Diagnosis Date Noted  . Obesity (BMI 30-39.9) 10/31/2011    Past Surgical History:  Procedure Laterality Date  . NO PAST SURGERIES      OB History    Gravida Para Term Preterm AB Living   4 4 4  0 0 4   SAB TAB Ectopic Multiple Live Births   0 0 0 0 1       Home Medications    Prior to Admission medications   Medication Sig Start Date End Date Taking? Authorizing Provider  ibuprofen (ADVIL,MOTRIN) 200 MG tablet Take 400 mg by mouth every 6 (six) hours as needed for moderate pain.    Historical Provider, MD  Multiple Vitamins-Minerals (MULTIVITAMIN WITH MINERALS) tablet Take 1 tablet by mouth daily.       Historical Provider, MD  phenazopyridine (PYRIDIUM) 200 MG tablet Take 1 tablet (200 mg total) by mouth 3 (three) times daily as needed for pain. 04/16/15   Dorathy KinsmanVirginia Smith, CNM    Family History Family History  Problem Relation Age of Onset  . Diabetes Mother   . Hypertension Mother   . Diabetes Father   . Hypertension Father   . Diabetes Maternal Grandmother   . Hypertension Maternal Grandmother   . Diabetes Maternal Grandfather   . Hypertension Maternal Grandfather   . Diabetes Paternal Grandmother   . Hypertension Paternal Grandmother   . Diabetes Paternal Grandfather   . Hypertension Paternal Grandfather   . Cancer Neg Hx   . Heart disease Neg Hx   . Stroke Neg Hx     Social History Social History  Substance Use Topics  . Smoking status: Never Smoker  . Smokeless tobacco: Never Used  . Alcohol use No     Allergies   Patient has no known allergies.   Review of Systems Review of Systems  Constitutional: Negative for chills and fever.  Respiratory: Negative for cough, chest tightness and shortness of breath.   Cardiovascular: Negative for chest pain, palpitations and leg swelling.  Gastrointestinal: Positive for abdominal pain. Negative for diarrhea, nausea and vomiting.  Genitourinary: Positive for dysuria, flank pain, pelvic pain and urgency.  Negative for vaginal bleeding, vaginal discharge and vaginal pain.  Musculoskeletal: Positive for back pain. Negative for arthralgias, myalgias, neck pain and neck stiffness.  Skin: Negative for rash.  Neurological: Negative for dizziness, weakness and headaches.  All other systems reviewed and are negative.    Physical Exam Updated Vital Signs BP 125/89 (BP Location: Right Arm)   Pulse 102   Temp 99.1 F (37.3 C) (Oral)   Resp 16   SpO2 98%   Physical Exam  Constitutional: She appears well-developed and well-nourished. No distress.  HENT:  Head: Normocephalic.  Eyes: Conjunctivae are normal.  Neck: Neck  supple.  Cardiovascular: Normal rate, regular rhythm and normal heart sounds.   Pulmonary/Chest: Effort normal and breath sounds normal. No respiratory distress. She has no wheezes. She has no rales.  Abdominal: Soft. Bowel sounds are normal. She exhibits no distension. There is no tenderness. There is no rebound.  Suprapubic tenderness, no CVA tenderness bilaterally.  Musculoskeletal: She exhibits no edema.  Neurological: She is alert.  Skin: Skin is warm and dry.  Psychiatric: She has a normal mood and affect. Her behavior is normal.  Nursing note and vitals reviewed.    ED Treatments / Results  Labs (all labs ordered are listed, but only abnormal results are displayed) Labs Reviewed  URINALYSIS, ROUTINE W REFLEX MICROSCOPIC (NOT AT [facility])  CBC WITH DIFFERENTIAL/PLATELET  POC URINE PREG, ED  I-STAT CHEM 8, ED    EKG  EKG Interpretation None       Radiology No results found.  Procedures Procedures (including critical care time)  Medications Ordered in ED Medications  ketorolac (TORADOL) injection 30 mg (not administered)     Initial Impression / Assessment and Plan / ED Course  I have reviewed the triage vital signs and the nursing notes.  Pertinent labs & imaging results that were available during my care of the patient were reviewed by me and considered in my medical decision making (see chart for details).  Clinical Course   Pt. seen and examined. Patient with dysuria, urinary frequency, urgency, suprapubic pain, bilateral flank pain. She denies any vaginal complaints. She denies possibly being pregnant. She is in no acute distress. Mildly tachycardic at 102, afebrile, blood pressure normal. Will check urinalysis, basic labs, we'll try Toradol for pain.   Pt's UA negative. Pt still refusing pelvic exam. Will get US to ro ovarian torsion, given suprapubic and bialteral adnexal pain on abdominal exam. Discussed lab results and UA results with pt and informed of plan  on getting US.    I was told that pt left without waiting on her results of the US.     Final Clinical Impressions(s) / ED Diagnoses   Final diagnoses:  Pelvic pain  Lower abdominal pain  Acute bilateral low back pain without sciatica    New Prescriptions New Prescriptions   No medications on file     Jaynie Crumbleatyana Alayha Babineaux, PA-C 12/17/15 2241    Donnetta HutchingBrian Cook, MD 12/22/15 [phone]

## 2016-01-05 ENCOUNTER — Encounter (HOSPITAL_COMMUNITY): Payer: Self-pay | Admitting: *Deleted

## 2016-01-05 ENCOUNTER — Inpatient Hospital Stay (HOSPITAL_COMMUNITY)
Admission: AD | Admit: 2016-01-05 | Discharge: 2016-01-05 | Disposition: A | Discharge status: Home / Self Care | Payer: Medicaid Other | Source: Ambulatory Visit | Attending: Obstetrics & Gynecology | Admitting: Obstetrics & Gynecology

## 2016-01-05 DIAGNOSIS — N921 Excessive and frequent menstruation with irregular cycle: Secondary | ICD-10-CM | POA: Diagnosis not present

## 2016-01-05 DIAGNOSIS — Z30431 Encounter for routine checking of intrauterine contraceptive device: Secondary | ICD-10-CM | POA: Insufficient documentation

## 2016-01-05 DIAGNOSIS — Z3202 Encounter for pregnancy test, result negative: Secondary | ICD-10-CM | POA: Insufficient documentation

## 2016-01-05 DIAGNOSIS — N939 Abnormal uterine and vaginal bleeding, unspecified: Secondary | ICD-10-CM | POA: Diagnosis not present

## 2016-01-05 DIAGNOSIS — Z975 Presence of (intrauterine) contraceptive device: Secondary | ICD-10-CM

## 2016-01-05 LAB — URINALYSIS, ROUTINE W REFLEX MICROSCOPIC
Bilirubin Urine: NEGATIVE
GLUCOSE, UA: NEGATIVE mg/dL
Ketones, ur: NEGATIVE mg/dL
Leukocytes, UA: NEGATIVE
Nitrite: NEGATIVE
PH: 6 (ref 5.0–8.0)
PROTEIN: NEGATIVE mg/dL
Specific Gravity, Urine: 1.015 (ref 1.005–1.030)

## 2016-01-05 LAB — CBC
HEMATOCRIT: 35.9 % — AB (ref 36.0–46.0)
Hemoglobin: 12.3 g/dL (ref 12.0–15.0)
MCH: 28 pg (ref 26.0–34.0)
MCHC: 34.3 g/dL (ref 30.0–36.0)
MCV: 81.6 fL (ref 78.0–100.0)
Platelets: 310 10*3/uL (ref 150–400)
RBC: 4.4 MIL/uL (ref 3.87–5.11)
RDW: 13.7 % (ref 11.5–15.5)
WBC: 9.5 10*3/uL (ref 4.0–10.5)

## 2016-01-05 LAB — POCT PREGNANCY, URINE: Preg Test, Ur: NEGATIVE

## 2016-01-05 LAB — URINE MICROSCOPIC-ADD ON
BACTERIA UA: NONE SEEN
WBC, UA: NONE SEEN WBC/hpf (ref 0–5)

## 2016-01-05 MED ORDER — NORGESTIMATE-ETH ESTRADIOL 0.25-35 MG-MCG PO TABS: 1.0000 | ORAL_TABLET | Freq: Every day | ORAL | 0 refills | Status: DC

## 2016-01-05 MED ORDER — IBUPROFEN 600 MG PO TABS: 600.0000 mg | ORAL_TABLET | Freq: Four times a day (QID) | ORAL | 3 refills | Status: DC | PRN

## 2016-01-05 NOTE — MAU Note (Signed)
Pt states she has been having vaginal bleeding for 15 days.  Pt states she is having abdominal and back pain.  Pt states she has Mirena.  She had in removed and replaced in August and this is her third IUD.

## 2016-01-05 NOTE — Discharge Instructions (Signed)
Abnormal Uterine Bleeding Abnormal uterine bleeding means bleeding from the vagina that is not your normal menstrual period. This can be:  Bleeding or spotting between periods.  Bleeding after sex (sexual intercourse).  Bleeding that is heavier or more than normal.  Periods that last longer than usual.  Bleeding after menopause. There are many problems that may cause this. Treatment will depend on the cause of the bleeding. Any kind of bleeding that is not normal should be reviewed by your doctor. Follow these instructions at home: Watch your condition for any changes. These actions may lessen any discomfort you are having:  Do not use tampons or douches as told by your doctor.  Change your pads often. You should get regular pelvic exams and Pap tests. Keep all appointments for tests as told by your doctor. Contact a doctor if:  You are bleeding for more than 1 week.  You feel dizzy at times. Get help right away if:  You pass out.  You have to change pads every 15 to 30 minutes.  You have belly pain.  You have a fever.  You become sweaty or weak.  You are passing large blood clots from the vagina.  You feel sick to your stomach (nauseous) and throw up (vomit). This information is not intended to replace advice given to you by your health care provider. Make sure you discuss any questions you have with your health care provider. Document Released: 11/18/2008 Document Revised: 06/29/2015 Document Reviewed: 08/20/2012 Elsevier Interactive Patient Education  2017 [employer]  

## 2016-01-05 NOTE — MAU Provider Note (Signed)
History     CSN: [id number]  Arrival date and time: 01/05/16 1016    First Provider Initiated Contact with Patient 01/05/16 1102      Chief Complaint  Patient presents with  . Vaginal Bleeding  . Abdominal Pain  . Back Pain   HPI Elizabeth Warren is a 35 y.o. female who presents for abdominal pain & vaginal bleeding. Mirena IUD place at [facility] the beginning of October. Since the beginning of this morning reports lower abdominal cramping that radiates to low back. Pain is intermittent. Rates pain 7/10. Has been treating with ibuprofen (per patient has been taking 200 mg BID) without relief. Was seen in ED 11/12 for same symptoms.  Reports vaginal bleeding x 15 days. Initially was red spotting that has becomes heavy with clots x 3 days. States is filling up pads several times a day; not wearing tampons. Passes several small clots.  Denies fever/chills, n/v/d, constipation, dysuria, dizziness, or palpitations.    Past Medical History:  Diagnosis Date  . NVD (normal vaginal delivery) 09/20/2010    Past Surgical History:  Procedure Laterality Date  . NO PAST SURGERIES      Family History  Problem Relation Age of Onset  . Diabetes Mother   . Hypertension Mother   . Diabetes Father   . Hypertension Father   . Diabetes Maternal Grandmother   . Hypertension Maternal Grandmother   . Diabetes Maternal Grandfather   . Hypertension Maternal Grandfather   . Diabetes Paternal Grandmother   . Hypertension Paternal Grandmother   . Diabetes Paternal Grandfather   . Hypertension Paternal Grandfather   . Cancer Neg Hx   . Heart disease Neg Hx   . Stroke Neg Hx     Social History  Substance Use Topics  . Smoking status: Never Smoker  . Smokeless tobacco: Never Used  . Alcohol use No    Allergies: No Known Allergies  Prescriptions Prior to Admission  Medication Sig Dispense Refill Last Dose  . cromolyn (OPTICROM) 4 % ophthalmic solution Place 1 drop into both eyes 2 (two) times  daily.  3 01/05/2016 at Unknown time  . ibuprofen (ADVIL,MOTRIN) 200 MG tablet Take 400 mg by mouth daily as needed for headache or moderate pain.    01/04/2016 at Unknown time  . Multiple Vitamin (MULTIVITAMIN WITH MINERALS) TABS tablet Take 1 tablet by mouth daily after lunch.   01/04/2016 at Unknown time  . OVER THE COUNTER MEDICATION Take 1 tablet by mouth daily as needed (energy boost). Energy tablet   Past Week at Unknown time  . levonorgestrel (MIRENA) 20 MCG/24HR IUD 1 each by Intrauterine route once. Implanted October 20, 2015   Continuous    Review of Systems  Constitutional: Negative for chills and fever.  Cardiovascular: Negative for palpitations.  Gastrointestinal: Positive for abdominal pain. Negative for constipation, diarrhea, nausea and vomiting.  Genitourinary: Negative for dysuria.       + vaginal bleeding  Musculoskeletal: Positive for back pain.  Neurological: Negative for dizziness and headaches.   Physical Exam   Blood pressure 116/73, pulse 90, temperature 98.4 F (36.9 C), temperature source Oral, last menstrual period 12/21/2015, SpO2 97 %.  Physical Exam  Nursing note and vitals reviewed. Constitutional: She is oriented to person, place, and time. She appears well-developed and well-nourished. No distress.  HENT:  Head: Normocephalic and atraumatic.  Eyes: Conjunctivae are normal. Right eye exhibits no discharge. Left eye exhibits no discharge. No scleral icterus.  Neck: Normal range of  motion.  Cardiovascular: Normal rate, regular rhythm and normal heart sounds.   No murmur heard. Respiratory: Effort normal and breath sounds normal. No respiratory distress. She has no wheezes.  GI: Soft. Bowel sounds are normal. She exhibits no distension. There is no tenderness. There is no rebound and no guarding.  Genitourinary: Uterus normal. Cervix exhibits no motion tenderness and no friability. There is bleeding (small amount of dark red blood) in the vagina.   Genitourinary Comments: IUD strings visualized  Neurological: She is alert and oriented to person, place, and time.  Skin: Skin is warm and dry. She is not diaphoretic.  Psychiatric: She has a normal mood and affect. Her behavior is normal. Judgment and thought content normal.    MAU Course  Procedures Results for orders placed or performed during the hospital encounter of 01/05/16 (from the past 24 hour(s))  Urinalysis, Routine w reflex microscopic (not at [facility])     Status: Abnormal   Collection Time: 01/05/16 10:42 AM  Result Value Ref Range   Color, Urine AMBER (A) YELLOW   APPearance CLOUDY (A) CLEAR   Specific Gravity, Urine 1.015 1.005 - 1.030   pH 6.0 5.0 - 8.0   Glucose, UA NEGATIVE NEGATIVE mg/dL   Hgb urine dipstick LARGE (A) NEGATIVE   Bilirubin Urine NEGATIVE NEGATIVE   Ketones, ur NEGATIVE NEGATIVE mg/dL   Protein, ur NEGATIVE NEGATIVE mg/dL   Nitrite NEGATIVE NEGATIVE   Leukocytes, UA NEGATIVE NEGATIVE  Urine microscopic-add on     Status: Abnormal   Collection Time: 01/05/16 10:42 AM  Result Value Ref Range   Squamous Epithelial / LPF 0-5 (A) NONE SEEN   WBC, UA NONE SEEN 0 - 5 WBC/hpf   RBC / HPF TOO NUMEROUS TO COUNT 0 - 5 RBC/hpf   Bacteria, UA NONE SEEN NONE SEEN  CBC     Status: Abnormal   Collection Time: 01/05/16 10:49 AM  Result Value Ref Range   WBC 9.5 4.0 - 10.5 K/uL   RBC 4.40 3.87 - 5.11 MIL/uL   Hemoglobin 12.3 12.0 - 15.0 g/dL   HCT 47.835.9 (L) 29.536.0 - 62.146.0 %   MCV 81.6 78.0 - 100.0 fL   MCH 28.0 26.0 - 34.0 pg   MCHC 34.3 30.0 - 36.0 g/dL   RDW 30.813.7 65.711.5 - 84.615.5 %   Platelets 310 150 - 400 K/uL  Pregnancy, urine POC     Status: None   Collection Time: 01/05/16 10:53 AM  Result Value Ref Range   Preg Test, Ur NEGATIVE NEGATIVE    MDM UPT negative Orthostatics CBC -- hemoglobin stable Normal pelvic ultrasound 12/17/15 -- no evidence of fibroids, IUD in place IUD strings visualized at today's visit Discussed irregular bleeding pattern  associated with IUDs. Patient stable at this time. Ok with trial of OCPs for a month & will f/u with [facility].  Assessment and Plan  A: 1. Breakthrough bleeding with IUD    P: Discharge home Rx ibuprofen & OCP x 1 month F/u with [facility] if symptoms continue  Elizabeth Warren 01/05/2016, 11:00 AM

## 2016-04-09 NOTE — Progress Notes (Signed)
   [facility] Phone: [phone]   Date of Visit: 04/10/2016   HPI:  Hair Thinning: - reports that she started noticing about 15 years ago but has been worsening especially in the past two months - reports she feels that her hair loss is through out her scalp not particularly from any specific area. Reports she is able to see her scalp. She does wear her hair in a bun/ponytail.  - reports of hair loss that runs in her family in women - has not noticed hair loss anywhere else on her body  - seen in clinic for symptoms on 01/2015 and at that time had TSH checked which was normal. More recent CBC in 12/2015 unremarkable.  - no cold/heat intolerance, no constipation, no weight loss/gain recently  Bilateral Breast Pain:  - reports of intermittent bilateral breast pain for the past 2 years  - reports that most of the time symptoms occur when she is about to start her period - no skin changes or nipple discharge  - no family history of breast cancer - reports other doctors over the past two years have examined and said she had normal exam.  - she would like the provider to complete a breast exam today.   ROS: See HPI.  PMFSH: PMH: - obesity  PHYSICAL EXAM: BP 112/68   Pulse (!) 113   Temp 99.4 F (37.4 C) (Oral)   Ht 5\' 6"  (1.676 m)   Wt 190 lb (86.2 kg)   SpO2 99%   BMI 30.67 kg/m   Repeat HR: 104  GEN: NAD HEENT: scalp- no scarring noted. Hair loss is not focal.  CV: RRR, no murmurs, rubs, or gallops PULM: CTAB, normal effort Brest: normal breast exam SKIN: No rash or cyanosis; warm and well-perfused PSYCH: Mood and affect euthymic, normal rate and volume of speech NEURO: Awake, alert, no focal deficits grossly, normal speech  ASSESSMENT/PLAN:  Thinning hair No scarring noted. Thinning distribution is not classic female pattern hair thinning. Discussed possible biopsy but patient declined. Will do trial of Rogaine for women. Follow up in 4  weeks  Chronic pain of breast Likely cyclic breast pain as symptoms come on close to her period. No tenderness or abnormalities noted on exam today. No indication for imaging currently  - reassured    Palma HolterKanishka G Ezio Wieck, MD PGY 2 [facility]

## 2016-04-10 ENCOUNTER — Encounter: Payer: Self-pay | Admitting: Internal Medicine

## 2016-04-10 ENCOUNTER — Ambulatory Visit (INDEPENDENT_AMBULATORY_CARE_PROVIDER_SITE_OTHER): Payer: Medicaid Other | Admitting: Internal Medicine

## 2016-04-10 VITALS — BP 112/68 | HR 104 | Temp 99.4°F | Ht 66.0 in | Wt 190.0 lb

## 2016-04-10 DIAGNOSIS — G8929 Other chronic pain: Secondary | ICD-10-CM | POA: Diagnosis not present

## 2016-04-10 DIAGNOSIS — L659 Nonscarring hair loss, unspecified: Secondary | ICD-10-CM

## 2016-04-10 DIAGNOSIS — N644 Mastodynia: Secondary | ICD-10-CM

## 2016-04-10 NOTE — Patient Instructions (Addendum)
You can try Rogaine for Women which is an over the counter medicine to put on your scalp to see if this will help with your hair loss. Follow up in 4 weeks.

## 2016-06-23 ENCOUNTER — Encounter (HOSPITAL_COMMUNITY): Payer: Self-pay | Admitting: Emergency Medicine

## 2016-06-23 ENCOUNTER — Emergency Department (HOSPITAL_COMMUNITY)
Admission: EM | Admit: 2016-06-23 | Discharge: 2016-06-23 | Disposition: A | Discharge status: Home / Self Care | Payer: Medicaid Other | Attending: Emergency Medicine | Admitting: Emergency Medicine

## 2016-06-23 DIAGNOSIS — Y9389 Activity, other specified: Secondary | ICD-10-CM | POA: Diagnosis not present

## 2016-06-23 DIAGNOSIS — S299XXA Unspecified injury of thorax, initial encounter: Secondary | ICD-10-CM | POA: Diagnosis present

## 2016-06-23 DIAGNOSIS — X58XXXA Exposure to other specified factors, initial encounter: Secondary | ICD-10-CM | POA: Diagnosis not present

## 2016-06-23 DIAGNOSIS — Y929 Unspecified place or not applicable: Secondary | ICD-10-CM | POA: Diagnosis not present

## 2016-06-23 DIAGNOSIS — S2002XA Contusion of left breast, initial encounter: Secondary | ICD-10-CM

## 2016-06-23 DIAGNOSIS — Y999 Unspecified external cause status: Secondary | ICD-10-CM | POA: Insufficient documentation

## 2016-06-23 LAB — CBC WITH DIFFERENTIAL/PLATELET
BASOS ABS: 0 10*3/uL (ref 0.0–0.1)
BASOS PCT: 1 %
Eosinophils Absolute: 0.6 10*3/uL (ref 0.0–0.7)
Eosinophils Relative: 7 %
HEMATOCRIT: 37.4 % (ref 36.0–46.0)
Hemoglobin: 11.9 g/dL — ABNORMAL LOW (ref 12.0–15.0)
LYMPHS PCT: 30 %
Lymphs Abs: 2.4 10*3/uL (ref 0.7–4.0)
MCH: 25.9 pg — ABNORMAL LOW (ref 26.0–34.0)
MCHC: 31.8 g/dL (ref 30.0–36.0)
MCV: 81.3 fL (ref 78.0–100.0)
MONO ABS: 0.5 10*3/uL (ref 0.1–1.0)
Monocytes Relative: 7 %
NEUTROS ABS: 4.5 10*3/uL (ref 1.7–7.7)
Neutrophils Relative %: 55 %
PLATELETS: 360 10*3/uL (ref 150–400)
RBC: 4.6 MIL/uL (ref 3.87–5.11)
RDW: 14.4 % (ref 11.5–15.5)
WBC: 8 10*3/uL (ref 4.0–10.5)
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
## 2016-06-23 NOTE — ED Notes (Signed)
Patient left without paperwork. MD had explained d/c.
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
## 2016-07-23 ENCOUNTER — Ambulatory Visit: Payer: Medicaid Other | Admitting: Internal Medicine

## 2016-07-23 NOTE — Progress Notes (Deleted)
   [facility] Phone: [phone]   Date of Visit: 07/23/2016   HPI:  ***  ROS: See HPI.  PMFSH:  PMH: Obesity  PHYSICAL EXAM: There were no vitals taken for this visit. Gen: *** HEENT: *** Heart: *** Lungs: *** Neuro: *** Ext: ***  ASSESSMENT/PLAN:  Health maintenance:  -***  No problem-specific Assessment & Plan notes found for this encounter.  FOLLOW UP: Follow up in *** for ***  Palma HolterKanishka G Gunadasa, MD PGY 2 [facility]

## 2016-08-08 ENCOUNTER — Encounter (HOSPITAL_COMMUNITY): Payer: Self-pay | Admitting: *Deleted

## 2016-08-08 ENCOUNTER — Inpatient Hospital Stay (HOSPITAL_COMMUNITY): Payer: Medicaid Other

## 2016-08-08 ENCOUNTER — Inpatient Hospital Stay (HOSPITAL_COMMUNITY)
Admission: AD | Admit: 2016-08-08 | Discharge: 2016-08-08 | Disposition: A | Discharge status: Home / Self Care | Payer: Medicaid Other | Source: Ambulatory Visit | Attending: Obstetrics and Gynecology | Admitting: Obstetrics and Gynecology

## 2016-08-08 DIAGNOSIS — O26891 Other specified pregnancy related conditions, first trimester: Secondary | ICD-10-CM

## 2016-08-08 DIAGNOSIS — R110 Nausea: Secondary | ICD-10-CM | POA: Diagnosis not present

## 2016-08-08 DIAGNOSIS — Z3A08 8 weeks gestation of pregnancy: Secondary | ICD-10-CM | POA: Insufficient documentation

## 2016-08-08 DIAGNOSIS — R109 Unspecified abdominal pain: Secondary | ICD-10-CM | POA: Diagnosis not present

## 2016-08-08 DIAGNOSIS — O26899 Other specified pregnancy related conditions, unspecified trimester: Secondary | ICD-10-CM

## 2016-08-08 LAB — HCG, QUANTITATIVE, PREGNANCY: hCG, Beta Chain, Quant, S: 148408 m[IU]/mL — ABNORMAL HIGH (ref <5)

## 2016-08-08 LAB — CBC WITH DIFFERENTIAL/PLATELET
BASOS ABS: 0.1 10*3/uL (ref 0.0–0.1)
BASOS PCT: 0 %
Eosinophils Absolute: 0.3 10*3/uL (ref 0.0–0.7)
Eosinophils Relative: 3 %
HCT: 35.9 % — ABNORMAL LOW (ref 36.0–46.0)
HEMOGLOBIN: 12.3 g/dL (ref 12.0–15.0)
Lymphocytes Relative: 31 %
Lymphs Abs: 3.7 10*3/uL (ref 0.7–4.0)
MCH: 27 pg (ref 26.0–34.0)
MCHC: 34.3 g/dL (ref 30.0–36.0)
MCV: 78.9 fL (ref 78.0–100.0)
Monocytes Absolute: 0.7 10*3/uL (ref 0.1–1.0)
Monocytes Relative: 6 %
NEUTROS ABS: 7.2 10*3/uL (ref 1.7–7.7)
NEUTROS PCT: 60 %
Platelets: 365 10*3/uL (ref 150–400)
RBC: 4.55 MIL/uL (ref 3.87–5.11)
RDW: 14.9 % (ref 11.5–15.5)
WBC: 12 10*3/uL — ABNORMAL HIGH (ref 4.0–10.5)

## 2016-08-08 LAB — URINALYSIS, ROUTINE W REFLEX MICROSCOPIC
BILIRUBIN URINE: NEGATIVE
GLUCOSE, UA: NEGATIVE mg/dL
HGB URINE DIPSTICK: NEGATIVE
KETONES UR: NEGATIVE mg/dL
NITRITE: NEGATIVE
PH: 5 (ref 5.0–8.0)
Protein, ur: 30 mg/dL — AB
SPECIFIC GRAVITY, URINE: 1.026 (ref 1.005–1.030)

## 2016-08-08 LAB — POCT PREGNANCY, URINE: Preg Test, Ur: POSITIVE — AB
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
## 2016-08-08 NOTE — Discharge Instructions (Signed)
First Trimester of Pregnancy The first trimester of pregnancy is from week 1 until the end of week 13 (months 1 through 3). During this time, your baby will begin to develop inside you. At 6-8 weeks, the eyes and face are formed, and the heartbeat can be seen on ultrasound. At the end of 12 weeks, all the baby's organs are formed. Prenatal care is all the medical care you receive before the birth of your baby. Make sure you get good prenatal care and follow all of your doctor's instructions. Follow these instructions at home: Medicines  Take over-the-counter and prescription medicines only as told by your doctor. Some medicines are safe and some medicines are not safe during pregnancy.  Take a prenatal vitamin that contains at least 600 micrograms (mcg) of folic acid.  If you have trouble pooping (constipation), take medicine that will make your stool soft (stool softener) if your doctor approves. Eating and drinking  Eat regular, healthy meals.  Your doctor will tell you the amount of weight gain that is right for you.  Avoid raw meat and uncooked cheese.  If you feel sick to your stomach (nauseous) or throw up (vomit): ? Eat 4 or 5 small meals a day instead of 3 large meals. ? Try eating a few soda crackers. ? Drink liquids between meals instead of during meals.  To prevent constipation: ? Eat foods that are high in fiber, like fresh fruits and vegetables, whole grains, and beans. ? Drink enough fluids to keep your pee (urine) clear or pale yellow. Activity  Exercise only as told by your doctor. Stop exercising if you have cramps or pain in your lower belly (abdomen) or low back.  Do not exercise if it is too hot, too humid, or if you are in a place of great height (high altitude).  Try to avoid standing for long periods of time. Move your legs often if you must stand in one place for a long time.  Avoid heavy lifting.  Wear low-heeled shoes. Sit and stand up straight.  You  can have sex unless your doctor tells you not to. Relieving pain and discomfort  Wear a good support bra if your breasts are sore.  Take warm water baths (sitz baths) to soothe pain or discomfort caused by hemorrhoids. Use hemorrhoid cream if your doctor says it is okay.  Rest with your legs raised if you have leg cramps or low back pain.  If you have puffy, bulging veins (varicose veins) in your legs: ? Wear support hose or compression stockings as told by your doctor. ? Raise (elevate) your feet for 15 minutes, 3-4 times a day. ? Limit salt in your food. Prenatal care  Schedule your prenatal visits by the twelfth week of pregnancy.  Write down your questions. Take them to your prenatal visits.  Keep all your prenatal visits as told by your doctor. This is important. Safety  Wear your seat belt at all times when driving.  Make a list of emergency phone numbers. The list should include numbers for family, friends, the hospital, and police and fire departments. General instructions  Ask your doctor for a referral to a local prenatal class. Begin classes no later than at the start of month 6 of your pregnancy.  Ask for help if you need counseling or if you need help with nutrition. Your doctor can give you advice or tell you where to go for help.  Do not use hot tubs, steam rooms, or   saunas.  Do not douche or use tampons or scented sanitary pads.  Do not cross your legs for long periods of time.  Avoid all herbs and alcohol. Avoid drugs that are not approved by your doctor.  Do not use any tobacco products, including cigarettes, chewing tobacco, and electronic cigarettes. If you need help quitting, ask your doctor. You may get counseling or other support to help you quit.  Avoid cat litter boxes and soil used by cats. These carry germs that can cause birth defects in the baby and can cause a loss of your baby (miscarriage) or stillbirth.  Visit your dentist. At home, brush  your teeth with a soft toothbrush. Be gentle when you floss. Contact a doctor if:  You are dizzy.  You have mild cramps or pressure in your lower belly.  You have a nagging pain in your belly area.  You continue to feel sick to your stomach, you throw up, or you have watery poop (diarrhea).  You have a bad smelling fluid coming from your vagina.  You have pain when you pee (urinate).  You have increased puffiness (swelling) in your face, hands, legs, or ankles. Get help right away if:  You have a fever.  You are leaking fluid from your vagina.  You have spotting or bleeding from your vagina.  You have very bad belly cramping or pain.  You gain or lose weight rapidly.  You throw up blood. It may look like coffee grounds.  You are around people who have German measles, fifth disease, or chickenpox.  You have a very bad headache.  You have shortness of breath.  You have any kind of trauma, such as from a fall or a car accident. Summary  The first trimester of pregnancy is from week 1 until the end of week 13 (months 1 through 3).  To take care of yourself and your unborn baby, you will need to eat healthy meals, take medicines only if your doctor tells you to do so, and do activities that are safe for you and your baby.  Keep all follow-up visits as told by your doctor. This is important as your doctor will have to ensure that your baby is healthy and growing well. This information is not intended to replace advice given to you by your health care provider. Make sure you discuss any questions you have with your health care provider. Document Released: 07/10/2007 Document Revised: 01/30/2016 Document Reviewed: 01/30/2016 Elsevier Interactive Patient Education  2017 Elsevier Inc.  

## 2016-08-08 NOTE — MAU Provider Note (Signed)
History     CSN: [id number]  Arrival date and time: 08/08/16 7425   First Provider Initiated Contact with Patient 08/08/16 2307      Chief Complaint  Patient presents with  . Emesis   HPI  Ms. Elizabeth Warren is a 36 y.o. [id number] at unknown GA who presents to MAU today with complaint of intermittent abdominal pain. The patient states that she had IUD placed a few months ago. She denies vaginal bleeding. She has had some nausea without vomiting or fever. She reports recent +HPT.   OB History    Gravida Para Term Preterm AB Living   5 4 4  0 0 4   SAB TAB Ectopic Multiple Live Births   0 0 0 0 4      Past Medical History:  Diagnosis Date  . NVD (normal vaginal delivery) 09/20/2010    Past Surgical History:  Procedure Laterality Date  . NO PAST SURGERIES      Family History  Problem Relation Age of Onset  . Diabetes Mother   . Hypertension Mother   . Diabetes Father   . Hypertension Father   . Diabetes Maternal Grandmother   . Hypertension Maternal Grandmother   . Diabetes Maternal Grandfather   . Hypertension Maternal Grandfather   . Diabetes Paternal Grandmother   . Hypertension Paternal Grandmother   . Diabetes Paternal Grandfather   . Hypertension Paternal Grandfather   . Cancer Neg Hx   . Heart disease Neg Hx   . Stroke Neg Hx     Social History  Substance Use Topics  . Smoking status: Never Smoker  . Smokeless tobacco: Never Used  . Alcohol use No    Allergies: No Known Allergies  No prescriptions prior to admission.    Review of Systems  Constitutional: Negative for fever.  Gastrointestinal: Positive for abdominal pain and nausea. Negative for constipation, diarrhea and vomiting.  Genitourinary: Negative for dysuria, frequency, urgency, vaginal bleeding and vaginal discharge.   Physical Exam   Blood pressure 113/77, pulse 97, temperature 98.1 F (36.7 C), temperature source Oral, resp. rate 20, height 5\' 5"  (1.651 m), weight 189 lb 12 oz  (86.1 kg), last menstrual period 12/21/2015.  Physical Exam  Nursing note and vitals reviewed. Constitutional: She is oriented to person, place, and time. She appears well-developed and well-nourished. No distress.  HENT:  Head: Normocephalic and atraumatic.  Cardiovascular: Normal rate.   Respiratory: Effort normal.  GI: Soft. She exhibits no distension and no mass. There is no tenderness. There is no rebound and no guarding.  Neurological: She is alert and oriented to person, place, and time.  Skin: Skin is warm and dry. No erythema.  Psychiatric: She has a normal mood and affect.    Results for orders placed or performed during the hospital encounter of 08/08/16 (from the past 24 hour(s))  Urinalysis, Routine w reflex microscopic     Status: Abnormal   Collection Time: 08/08/16  7:48 PM  Result Value Ref Range   Color, Urine YELLOW YELLOW   APPearance HAZY (A) CLEAR   Specific Gravity, Urine 1.026 1.005 - 1.030   pH 5.0 5.0 - 8.0   Glucose, UA NEGATIVE NEGATIVE mg/dL   Hgb urine dipstick NEGATIVE NEGATIVE   Bilirubin Urine NEGATIVE NEGATIVE   Ketones, ur NEGATIVE NEGATIVE mg/dL   Protein, ur 30 (A) NEGATIVE mg/dL   Nitrite NEGATIVE NEGATIVE   Leukocytes, UA TRACE (A) NEGATIVE   RBC / HPF 0-5 0 - 5 RBC/hpf  WBC, UA 0-5 0 - 5 WBC/hpf   Bacteria, UA FEW (A) NONE SEEN   Squamous Epithelial / LPF 6-30 (A) NONE SEEN   Mucous PRESENT   Pregnancy, urine POC     Status: Abnormal   Collection Time: 08/08/16  8:09 PM  Result Value Ref Range   Preg Test, Ur POSITIVE (A) NEGATIVE  CBC with Differential/Platelet     Status: Abnormal   Collection Time: 08/08/16  8:28 PM  Result Value Ref Range   WBC 12.0 (H) 4.0 - 10.5 K/uL   RBC 4.55 3.87 - 5.11 MIL/uL   Hemoglobin 12.3 12.0 - 15.0 g/dL   HCT 08.635.9 (L) 57.836.0 - 46.946.0 %   MCV 78.9 78.0 - 100.0 fL   MCH 27.0 26.0 - 34.0 pg   MCHC 34.3 30.0 - 36.0 g/dL   RDW 62.914.9 52.811.5 - 41.315.5 %   Platelets 365 150 - 400 K/uL   Neutrophils Relative %  60 %   Neutro Abs 7.2 1.7 - 7.7 K/uL   Lymphocytes Relative 31 %   Lymphs Abs 3.7 0.7 - 4.0 K/uL   Monocytes Relative 6 %   Monocytes Absolute 0.7 0.1 - 1.0 K/uL   Eosinophils Relative 3 %   Eosinophils Absolute 0.3 0.0 - 0.7 K/uL   Basophils Relative 0 %   Basophils Absolute 0.1 0.0 - 0.1 K/uL  hCG, quantitative, pregnancy     Status: Abnormal   Collection Time: 08/08/16  8:28 PM  Result Value Ref Range   hCG, Beta Chain, Quant, S 148,408 (H) <5 mIU/mL   [place] Ob Comp Less 14 Wks  Result Date: 08/08/2016 CLINICAL DATA:  36 year old pregnant female with pelvic pain. Questionable history of IUD. Beta HCG of 148,000. EXAM: OBSTETRIC <14 WK ULTRASOUND TECHNIQUE: Transabdominal ultrasound was performed for evaluation of the gestation as well as the maternal uterus and adnexal regions. COMPARISON:  12/17/2015 pelvic ultrasound FINDINGS: Intrauterine gestational sac: Single Yolk sac:  Visualized. Embryo:  Visualized. Cardiac Activity: Visualized. Heart Rate: 178 bpm CRL:   16.4  mm   8 w 0 d                  US EDC: 03/20/2017 Subchorionic hemorrhage:  None visualized. Maternal uterus/adnexae: The ovaries bilaterally are unremarkable. There is no evidence of free fluid or adnexal mass. There is no evidence of IUD. IMPRESSION: Single living intrauterine gestation with estimated gestational age of [redacted] weeks 0 days by this ultrasound. No abnormalities identified.  No evidence of IUD. Electronically Signed   By: Harmon PierJeffrey  Hu M.D.   On: 08/08/2016 22:28    MAU Course  Procedures None  MDM +UPT UA, CBC, quant hCG, HIV, RPR and US today to rule out ectopic pregnancy A+ blood type   Assessment and Plan  A: SIUP at 14101w0d Nausea Abdominal pain in pregnancy, first trimester   P: Discharge home Tylenol PRN for pain List of approved OTC medications for pregnancy given  First trimester precautions discussed Patient advised to follow-up with [facility] to start prenatal care Patient may return to MAU as  needed or if her condition were to change or worsen  Vonzella NippleJulie Krystal Teachey, PA-C 08/09/2016, 1:19 AM

## 2016-08-09 LAB — RPR: RPR: NONREACTIVE

## 2016-08-09 LAB — HIV ANTIBODY (ROUTINE TESTING W REFLEX): HIV SCREEN 4TH GENERATION: NONREACTIVE

## 2016-08-13 ENCOUNTER — Inpatient Hospital Stay (HOSPITAL_COMMUNITY)
Admission: AD | Admit: 2016-08-13 | Discharge: 2016-08-13 | Disposition: A | Discharge status: Home / Self Care | Payer: Medicaid Other | Source: Ambulatory Visit | Attending: Family Medicine | Admitting: Family Medicine

## 2016-08-13 ENCOUNTER — Encounter (HOSPITAL_COMMUNITY): Payer: Self-pay | Admitting: *Deleted

## 2016-08-13 DIAGNOSIS — O219 Vomiting of pregnancy, unspecified: Secondary | ICD-10-CM

## 2016-08-13 DIAGNOSIS — Z3A08 8 weeks gestation of pregnancy: Secondary | ICD-10-CM | POA: Diagnosis not present

## 2016-08-13 DIAGNOSIS — O9989 Other specified diseases and conditions complicating pregnancy, childbirth and the puerperium: Secondary | ICD-10-CM | POA: Diagnosis not present

## 2016-08-13 DIAGNOSIS — R109 Unspecified abdominal pain: Secondary | ICD-10-CM

## 2016-08-13 LAB — URINALYSIS, ROUTINE W REFLEX MICROSCOPIC
BILIRUBIN URINE: NEGATIVE
GLUCOSE, UA: NEGATIVE mg/dL
Hgb urine dipstick: NEGATIVE
KETONES UR: NEGATIVE mg/dL
LEUKOCYTES UA: NEGATIVE
NITRITE: NEGATIVE
PROTEIN: NEGATIVE mg/dL
Specific Gravity, Urine: 1.027 (ref 1.005–1.030)
pH: 5 (ref 5.0–8.0)

## 2016-08-13 MED ORDER — PROMETHAZINE HCL 25 MG PO TABS
12.5000 mg | ORAL_TABLET | Freq: Once | ORAL | Status: AC
  Administered 2016-08-13: 12.5 mg via ORAL
  Filled 2016-08-13: qty 1

## 2016-08-13 MED ORDER — PROMETHAZINE HCL 12.5 MG PO TABS: 12.5000 mg | ORAL_TABLET | Freq: Four times a day (QID) | ORAL | 0 refills | Status: DC | PRN

## 2016-08-13 NOTE — MAU Note (Signed)
Pt. Tolerated crackers and juice without emesis.

## 2016-08-13 NOTE — MAU Note (Signed)
Pt C/O vomiting for the last week, can't hold anything down.  Denies pain or bleeding. 

## 2016-08-13 NOTE — Discharge Instructions (Signed)
Morning Sickness Morning sickness is when you feel sick to your stomach (nauseous) during pregnancy. You may feel sick to your stomach and throw up (vomit). You may feel sick in the morning, but you can feel this way any time of day. Some women feel very sick to their stomach and cannot stop throwing up (hyperemesis gravidarum). Follow these instructions at home:  Only take medicines as told by your doctor.  Take multivitamins as told by your doctor. Taking multivitamins before getting pregnant can stop or lessen the harshness of morning sickness.  Eat dry toast or unsalted crackers before getting out of bed.  Eat 5 to 6 small meals a day.  Eat dry and bland foods like rice and baked potatoes.  Do not drink liquids with meals. Drink between meals.  Do not eat greasy, fatty, or spicy foods.  Have someone cook for you if the smell of food causes you to feel sick or throw up.  If you feel sick to your stomach after taking prenatal vitamins, take them at night or with a snack.  Eat protein when you need a snack (nuts, yogurt, cheese).  Eat unsweetened gelatins for dessert.  Wear a bracelet used for sea sickness (acupressure wristband).  Go to a doctor that puts thin needles into certain body points (acupuncture) to improve how you feel.  Do not smoke.  Use a humidifier to keep the air in your house free of odors.  Get lots of fresh air. Contact a doctor if:  You need medicine to feel better.  You feel dizzy or lightheaded.  You are losing weight. Get help right away if:  You feel very sick to your stomach and cannot stop throwing up.  You pass out (faint). This information is not intended to replace advice given to you by your health care provider. Make sure you discuss any questions you have with your health care provider. Document Released: 02/29/2004 Document Revised: 06/29/2015 Document Reviewed: 07/08/2012 Elsevier Interactive Patient Education  2017 Elsevier  Inc.   SAFE MEDICATIONS IN PREGNANCY  Acne:  Benzoyl Peroxide  Salicylic Acid   Backache/Headache:  Tylenol: 2 regular strength every 4 hours OR        2 Extra strength every 6 hours   Colds/Coughs/Allergies:  Benadryl (alcohol free) 25 mg every 6 hours as needed  Breath right strips  Claritin  Cepacol throat lozenges  Chloraseptic throat spray  Cold-Eeze- up to three times per day  Cough drops, alcohol free  Flonase (by prescription only)  Guaifenesin  Mucinex  Robitussin DM (plain only, alcohol free)  Saline nasal spray/drops  Sudafed (pseudoephedrine) & Actifed * use only after [redacted] weeks gestation and if you do not have high blood pressure  Tylenol  Vicks Vaporub  Zinc lozenges  Zyrtec   Constipation:  Colace  Ducolax suppositories  Fleet enema  Glycerin suppositories  Metamucil  Milk of magnesia  Miralax  Senokot  Smooth move tea   Diarrhea:  Kaopectate  Imodium A-D   *NO pepto Bismol   Hemorrhoids:  Anusol  Anusol HC  Preparation H  Tucks   Indigestion:  Tums  Maalox  Mylanta  Zantac  Pepcid   Insomnia:  Benadryl (alcohol free) 25mg  every 6 hours as needed  Tylenol PM  Unisom, no Gelcaps   Leg Cramps:  Tums  MagGel   Nausea/Vomiting:  Bonine  Dramamine  Emetrol  Ginger extract  Sea bands  Meclizine  Nausea medication to take during pregnancy:  Unisom (doxylamine succinate  25 mg tablets) Take one tablet daily at bedtime. If symptoms are not adequately controlled, the dose can be increased to a maximum recommended dose of two tablets daily (1/2 tablet in the morning, 1/2 tablet mid-afternoon and one at bedtime).  Vitamin B6 100mg  tablets. Take one tablet twice a day (up to 200 mg per day).   Skin Rashes:  Aveeno products  Benadryl cream or 25mg  every 6 hours as needed  Calamine Lotion  1% cortisone cream   Yeast infection:  Gyne-lotrimin 7  Monistat 7    **If taking multiple medications, please check labels to avoid  duplicating the same active ingredients  **take medication as directed on the label  ** Do not exceed 4000 mg of tylenol in 24 hours  **Do not take medications that contain aspirin or ibuprofen

## 2016-08-13 NOTE — MAU Note (Signed)
Pt. Given antiemetic at 1917, crackers and juice given per patient request.  Nurse will monitor patient for emesis. VS WNL.

## 2016-08-13 NOTE — MAU Provider Note (Signed)
Chief Complaint: Emesis   SUBJECTIVE HPI: Elizabeth Warren is a 36 y.o. [id number] at 9071w5d who presents to Maternity Admissions reporting intractable nausea/vomiting.  Patient states she has not been able to tolerate anything PO for the past week. She states she is throwing up, when asked about type of emesis, she explains she is constantly spitting, has no appetite, and is always nauseous. She states she was told to get Unisom, she attempted it once, but states it did not help. Denies urinary symptoms, SOB/CP, F/C, CVAT. Denies abdominal pain or vaginal bleeding.    Past Medical History:  Diagnosis Date  . NVD (normal vaginal delivery) 09/20/2010   OB History  Gravida Para Term Preterm AB Living  5 4 4  0 0 4  SAB TAB Ectopic Multiple Live Births  0 0 0 0 4    # Outcome Date GA Lbr Len/2nd Weight Sex Delivery Anes PTL Lv  5 Current           4 Term 09/20/10 884w0d 15:45 / 00:19 8 lb 7.8 oz (3.85 kg) F Vag-Spont EPI  LIV  3 Term      Vag-Spont     2 Term      Vag-Spont     1 Term      Vag-Spont        Past Surgical History:  Procedure Laterality Date  . NO PAST SURGERIES     Social History   Social History  . Marital status: Married    Spouse name: N/A  . Number of children: N/A  . Years of education: N/A   Occupational History  . Not on file.   Social History Main Topics  . Smoking status: Never Smoker  . Smokeless tobacco: Never Used  . Alcohol use No  . Drug use: No  . Sexual activity: Not on file   Other Topics Concern  . Not on file   Social History Narrative  . No narrative on file   No current facility-administered medications on file prior to encounter.    Current Outpatient Prescriptions on File Prior to Encounter  Medication Sig Dispense Refill  . Multiple Vitamin (MULTIVITAMIN WITH MINERALS) TABS tablet Take 1 tablet by mouth daily after lunch.     No Known Allergies  I have reviewed the past Medical Hx, Surgical Hx, Social Hx, Allergies and  Medications.   REVIEW OF SYSTEMS  A comprehensive ROS was negative except per HPI.   OBJECTIVE Patient Vitals for the past 24 hrs:  BP Temp Temp src Pulse Resp Weight  08/13/16 1927 122/78 98.5 F (36.9 C) Oral 97 18 -  08/13/16 1846 - - - - - 188 lb (85.3 kg)  08/13/16 1841 117/70 98.4 F (36.9 C) Oral (!) 110 18 -    PHYSICAL EXAM Constitutional: Well-developed, well-nourished female in no acute distress.  Cardiovascular: normal rate, rhythm, no murmurs Respiratory: normal rate and effort. CTAB GI: Abd soft, non-tender, non-distended. Pos BS x 4 MS: Extremities nontender, no edema, normal ROM Neurologic: Alert and oriented x 4.  GU: Neg CVAT.   LAB RESULTS Results for orders placed or performed during the hospital encounter of 08/13/16 (from the past 24 hour(s))  Urinalysis, Routine w reflex microscopic     Status: None   Collection Time: 08/13/16  6:35 PM  Result Value Ref Range   Color, Urine YELLOW YELLOW   APPearance CLEAR CLEAR   Specific Gravity, Urine 1.027 1.005 - 1.030   pH 5.0 5.0 - 8.0  Glucose, UA NEGATIVE NEGATIVE mg/dL   Hgb urine dipstick NEGATIVE NEGATIVE   Bilirubin Urine NEGATIVE NEGATIVE   Ketones, ur NEGATIVE NEGATIVE mg/dL   Protein, ur NEGATIVE NEGATIVE mg/dL   Nitrite NEGATIVE NEGATIVE   Leukocytes, UA NEGATIVE NEGATIVE    IMAGING US Ob Comp Less 14 Wks  Result Date: 08/08/2016 CLINICAL DATA:  36 year old pregnant female with pelvic pain. Questionable history of IUD. Beta HCG of 148,000. EXAM: OBSTETRIC <14 WK ULTRASOUND TECHNIQUE: Transabdominal ultrasound was performed for evaluation of the gestation as well as the maternal uterus and adnexal regions. COMPARISON:  12/17/2015 pelvic ultrasound FINDINGS: Intrauterine gestational sac: Single Yolk sac:  Visualized. Embryo:  Visualized. Cardiac Activity: Visualized. Heart Rate: 178 bpm CRL:   16.4  mm   8 w 0 d                  [place] EDC: 03/20/2017 Subchorionic hemorrhage:  None visualized. Maternal  uterus/adnexae: The ovaries bilaterally are unremarkable. There is no evidence of free fluid or adnexal mass. There is no evidence of IUD. IMPRESSION: Single living intrauterine gestation with estimated gestational age of [redacted] weeks 0 days by this ultrasound. No abnormalities identified.  No evidence of IUD. Electronically Signed   By: Harmon Pier M.D.   On: 08/08/2016 22:28    MAU COURSE Phenergan PO trial - able to tolerate crackers and juice VSS UA - neg  MDM Plan of care reviewed with patient, including labs and tests ordered and medical treatment. Patient given education on nausea/vomiting of pregnancy and return precautions.    ASSESSMENT 1. Nausea and vomiting during pregnancy     PLAN Discharge home in stable condition. Phenergan Rx given    Allergies as of 08/13/2016   No Known Allergies     Medication List    TAKE these medications   multivitamin with minerals Tabs tablet Take 1 tablet by mouth daily after lunch.   promethazine 12.5 MG tablet Commonly known as:  PHENERGAN Take 1 tablet (12.5 mg total) by mouth every 6 (six) hours as needed for nausea or vomiting. Take a second tablet if still nauseous.        Jen Mow, DO OB Fellow 08/13/2016 7:47 PM

## 2016-09-02 ENCOUNTER — Encounter: Payer: Medicaid Other | Admitting: Certified Nurse Midwife

## 2016-09-02 ENCOUNTER — Other Ambulatory Visit (HOSPITAL_COMMUNITY)
Admission: RE | Admit: 2016-09-02 | Discharge: 2016-09-02 | Disposition: A | Discharge status: Home / Self Care | Payer: Medicaid Other | Source: Ambulatory Visit | Attending: Certified Nurse Midwife | Admitting: Certified Nurse Midwife

## 2016-09-02 ENCOUNTER — Ambulatory Visit (INDEPENDENT_AMBULATORY_CARE_PROVIDER_SITE_OTHER): Payer: Medicaid Other | Admitting: Certified Nurse Midwife

## 2016-09-02 ENCOUNTER — Encounter: Payer: Self-pay | Admitting: Certified Nurse Midwife

## 2016-09-02 VITALS — BP 113/74 | HR 103 | Wt 193.5 lb

## 2016-09-02 DIAGNOSIS — Z3481 Encounter for supervision of other normal pregnancy, first trimester: Secondary | ICD-10-CM

## 2016-09-02 DIAGNOSIS — Z3480 Encounter for supervision of other normal pregnancy, unspecified trimester: Secondary | ICD-10-CM | POA: Diagnosis present

## 2016-09-02 DIAGNOSIS — O0990 Supervision of high risk pregnancy, unspecified, unspecified trimester: Secondary | ICD-10-CM | POA: Insufficient documentation

## 2016-09-02 MED ORDER — PROVIDA DHA 16-16-1.25-110 MG PO CAPS: 1.0000 | ORAL_CAPSULE | Freq: Every day | ORAL | 12 refills | Status: DC

## 2016-09-02 NOTE — Progress Notes (Signed)
Patient is in the office for initial ob visit, no complaints. Pt states that she has to ask her husband first, before agreeing to babyscripts.

## 2016-09-02 NOTE — Addendum Note (Signed)
Addended by: Orvilla CornwallENNEY, Ramaya Guile A on: 09/02/2016 03:54 PM   Modules accepted: Orders

## 2016-09-02 NOTE — Progress Notes (Signed)
Subjective:    Elizabeth Warren is being seen today for her first obstetrical visit.  This is not a planned pregnancy, had expelled Mirena IUD. She is at 6397w4d gestation. Her obstetrical history is significant for obesity and grand multip. Relationship with FOB: spouse, living together. Patient does intend to breast feed. Pregnancy history fully reviewed.  The information documented in the HPI was reviewed and verified.  Menstrual History: OB History    Gravida Para Term Preterm AB Living   5 4 4  0 0 4   SAB TAB Ectopic Multiple Live Births   0 0 0 0 4       Patient's last menstrual period was 12/21/2015.    Past Medical History:  Diagnosis Date  . NVD (normal vaginal delivery) 09/20/2010    Past Surgical History:  Procedure Laterality Date  . NO PAST SURGERIES       (Not in a hospital admission) No Known Allergies  Social History  Substance Use Topics  . Smoking status: Never Smoker  . Smokeless tobacco: Never Used  . Alcohol use No    Family History  Problem Relation Age of Onset  . Diabetes Mother   . Hypertension Mother   . Diabetes Father   . Hypertension Father   . Diabetes Maternal Grandmother   . Hypertension Maternal Grandmother   . Diabetes Maternal Grandfather   . Hypertension Maternal Grandfather   . Diabetes Paternal Grandmother   . Hypertension Paternal Grandmother   . Diabetes Paternal Grandfather   . Hypertension Paternal Grandfather   . Cancer Neg Hx   . Heart disease Neg Hx   . Stroke Neg Hx      Review of Systems Constitutional: negative for weight loss Gastrointestinal: negative for vomiting Genitourinary:negative for genital lesions and vaginal discharge and dysuria Musculoskeletal:negative for back pain Behavioral/Psych: negative for abusive relationship, depression, illegal drug usage and tobacco use    Objective:    BP 113/74   Pulse (!) 103   Wt 193 lb 8 oz (87.8 kg)   LMP 12/21/2015   BMI 32.20 kg/m  General Appearance:     Alert, cooperative, no distress, appears stated age  Head:    Normocephalic, without obvious abnormality, atraumatic  Eyes:    PERRL, conjunctiva/corneas clear, EOM's intact, fundi    benign, both eyes  Ears:    Normal TM's and external ear canals, both ears  Nose:   Nares normal, septum midline, mucosa normal, no drainage    or sinus tenderness  Throat:   Lips, mucosa, and tongue normal; teeth and gums normal  Neck:   Supple, symmetrical, trachea midline, no adenopathy;    thyroid:  no enlargement/tenderness/nodules; no carotid   bruit or JVD  Back:     Symmetric, no curvature, ROM normal, no CVA tenderness  Lungs:     Clear to auscultation bilaterally, respirations unlabored  Chest Wall:    No tenderness or deformity   Heart:    Regular rate and rhythm, S1 and S2 normal, no murmur, rub   or gallop  Breast Exam:    No tenderness, masses, or nipple abnormality  Abdomen:     Soft, non-tender, bowel sounds active all four quadrants,    no masses, no organomegaly  Genitalia:    Normal female without lesion, discharge or tenderness  Extremities:   Extremities normal, atraumatic, no cyanosis or edema  Pulses:   2+ and symmetric all extremities  Skin:   Skin color, texture, turgor normal, no rashes  or lesions  Lymph nodes:   Cervical, supraclavicular, and axillary nodes normal  Neurologic:   CNII-XII intact, normal strength, sensation and reflexes    throughout      Lab Review Urine pregnancy test Labs reviewed no Radiologic studies reviewed yes  Assessment & Plan    Pregnancy at 6378w4d weeks    1. Supervision of other normal pregnancy, antepartum     - Cytology - PAP - Cervicovaginal ancillary only - Hemoglobinopathy evaluation - Hemoglobin A1c - Vitamin D (25 hydroxy) - Culture, OB Urine - MaterniT21 PLUS Core+SCA - Obstetric Panel, Including HIV - Prenat-FeFum-FePo-FA-DHA w/o A (PROVIDA DHA) 16-16-1.25-110 MG CAPS; Take 1 tablet by mouth daily.  Dispense: 30 capsule; Refill:  12     Prenatal vitamins.  Counseling provided regarding continued use of seat belts, cessation of alcohol consumption, smoking or use of illicit drugs; infection precautions i.e., influenza/TDAP immunizations, toxoplasmosis,CMV, parvovirus, listeria and varicella; workplace safety, exercise during pregnancy; routine dental care, safe medications, sexual activity, hot tubs, saunas, pools, travel, caffeine use, fish and methlymercury, potential toxins, hair treatments, varicose veins Weight gain recommendations per IOM guidelines reviewed: underweight/BMI< 18.5--> gain 28 - 40 lbs; normal weight/BMI 18.5 - 24.9--> gain 25 - 35 lbs; overweight/BMI 25 - 29.9--> gain 15 - 25 lbs; obese/BMI >30->gain  11 - 20 lbs Problem list reviewed and updated. FIRST/CF mutation testing/NIPT/QUAD SCREEN/fragile X/Ashkenazi Jewish population testing/Spinal muscular atrophy discussed: ordered. Role of ultrasound in pregnancy discussed; fetal survey: requested. Amniocentesis discussed: not indicated.  Meds ordered this encounter  Medications  . Prenatal Vit-Fe Fumarate-FA (PRENATAL ONE DAILY PO)    Sig: Take by mouth.  . Prenat-FeFum-FePo-FA-DHA w/o A (PROVIDA DHA) 16-16-1.25-110 MG CAPS    Sig: Take 1 tablet by mouth daily.    Dispense:  30 capsule    Refill:  12   Orders Placed This Encounter  Procedures  . Culture, OB Urine  . Hemoglobinopathy evaluation  . Hemoglobin A1c  . Vitamin D (25 hydroxy)  . MaterniT21 PLUS Core+SCA    Order Specific Question:   Is patient insulin dependent?    Answer:   No    Order Specific Question:   Weight (lbs)    Answer:   35193    Order Specific Question:   Gestational Age (GA), weeks    Answer:   11.4    Order Specific Question:   Date on which patient was at this GA    Answer:   09/02/2016    Order Specific Question:   GA Calculation Method    Answer:   Ultrasound    Order Specific Question:   GA Date    Answer:   03/20/2017    Order Specific Question:   Number of  fetuses    Answer:   1    Order Specific Question:   Donor egg?    Answer:   N    Order Specific Question:   Age of egg donor?    Answer:   36  . Obstetric Panel, Including HIV    Follow up in 4 weeks. 50% of 30 min visit spent on counseling and coordination of care.

## 2016-09-03 ENCOUNTER — Other Ambulatory Visit: Payer: Self-pay | Admitting: Certified Nurse Midwife

## 2016-09-03 ENCOUNTER — Other Ambulatory Visit: Payer: Medicaid Other

## 2016-09-03 DIAGNOSIS — R7989 Other specified abnormal findings of blood chemistry: Secondary | ICD-10-CM | POA: Insufficient documentation

## 2016-09-03 LAB — CERVICOVAGINAL ANCILLARY ONLY
BACTERIAL VAGINITIS: NEGATIVE
CANDIDA VAGINITIS: NEGATIVE
Chlamydia: NEGATIVE
NEISSERIA GONORRHEA: NEGATIVE
Trichomonas: NEGATIVE

## 2016-09-03 LAB — VITAMIN D 25 HYDROXY (VIT D DEFICIENCY, FRACTURES): VIT D 25 HYDROXY: 19.1 ng/mL — AB (ref 30.0–100.0)

## 2016-09-03 MED ORDER — VITAMIN D (ERGOCALCIFEROL) 1.25 MG (50000 UNIT) PO CAPS: 50000.0000 [IU] | ORAL_CAPSULE | ORAL | 2 refills | Status: DC

## 2016-09-04 ENCOUNTER — Other Ambulatory Visit: Payer: Self-pay | Admitting: Certified Nurse Midwife

## 2016-09-04 DIAGNOSIS — Z3480 Encounter for supervision of other normal pregnancy, unspecified trimester: Secondary | ICD-10-CM

## 2016-09-04 LAB — CYTOLOGY - PAP
Diagnosis: NEGATIVE
HPV: NOT DETECTED

## 2016-09-05 LAB — OBSTETRIC PANEL, INCLUDING HIV
Antibody Screen: NEGATIVE
BASOS ABS: 0 10*3/uL (ref 0.0–0.2)
Basos: 0 %
EOS (ABSOLUTE): 0.3 10*3/uL (ref 0.0–0.4)
EOS: 3 %
HEMOGLOBIN: 12 g/dL (ref 11.1–15.9)
HEP B S AG: NEGATIVE
HIV Screen 4th Generation wRfx: NONREACTIVE
Hematocrit: 37.3 % (ref 34.0–46.6)
IMMATURE GRANULOCYTES: 0 %
Immature Grans (Abs): 0 10*3/uL (ref 0.0–0.1)
LYMPHS ABS: 2.5 10*3/uL (ref 0.7–3.1)
Lymphs: 24 %
MCH: 26.5 pg — ABNORMAL LOW (ref 26.6–33.0)
MCHC: 32.2 g/dL (ref 31.5–35.7)
MCV: 83 fL (ref 79–97)
MONOCYTES: 6 %
Monocytes Absolute: 0.6 10*3/uL (ref 0.1–0.9)
NEUTROS PCT: 67 %
Neutrophils Absolute: 7.1 10*3/uL — ABNORMAL HIGH (ref 1.4–7.0)
PLATELETS: 347 10*3/uL (ref 150–379)
RBC: 4.52 x10E6/uL (ref 3.77–5.28)
RDW: 15.9 % — ABNORMAL HIGH (ref 12.3–15.4)
RH TYPE: POSITIVE
RPR: NONREACTIVE
RUBELLA: 22.8 {index} (ref >0.99)
WBC: 10.6 10*3/uL (ref 3.4–10.8)

## 2016-09-05 LAB — HEMOGLOBINOPATHY EVALUATION
HEMOGLOBIN A2 QUANTITATION: 2.8 % (ref 1.8–3.2)
HGB C: 0 %
HGB S: 0 %
HGB VARIANT: 0 %
Hemoglobin F Quantitation: 0 % (ref 0.0–2.0)
Hgb A: 97.2 % (ref 96.4–98.8)

## 2016-09-05 LAB — HEMOGLOBIN A1C
ESTIMATED AVERAGE GLUCOSE: 120 mg/dL
HEMOGLOBIN A1C: 5.8 % — AB (ref 4.8–5.6)

## 2016-09-06 ENCOUNTER — Other Ambulatory Visit: Payer: Self-pay | Admitting: Certified Nurse Midwife

## 2016-09-06 DIAGNOSIS — Z8632 Personal history of gestational diabetes: Secondary | ICD-10-CM

## 2016-09-06 DIAGNOSIS — R7309 Other abnormal glucose: Secondary | ICD-10-CM

## 2016-09-06 HISTORY — DX: Personal history of gestational diabetes: Z86.32

## 2016-09-07 LAB — URINE CULTURE, OB REFLEX

## 2016-09-07 LAB — CULTURE, OB URINE

## 2016-09-09 ENCOUNTER — Other Ambulatory Visit: Payer: Self-pay | Admitting: Certified Nurse Midwife

## 2016-09-09 DIAGNOSIS — Z3480 Encounter for supervision of other normal pregnancy, unspecified trimester: Secondary | ICD-10-CM

## 2016-09-09 LAB — MATERNIT21 PLUS CORE+SCA
CHROMOSOME 13: NEGATIVE
CHROMOSOME 21: NEGATIVE
Chromosome 18: NEGATIVE
Y CHROMOSOME: DETECTED

## 2016-09-10 ENCOUNTER — Other Ambulatory Visit: Payer: Self-pay | Admitting: Certified Nurse Midwife

## 2016-09-10 DIAGNOSIS — Z3480 Encounter for supervision of other normal pregnancy, unspecified trimester: Secondary | ICD-10-CM

## 2016-09-10 LAB — CYSTIC FIBROSIS MUTATION 97: GENE DIS ANAL CARRIER INTERP BLD/T-IMP: NOT DETECTED

## 2016-10-01 ENCOUNTER — Ambulatory Visit (INDEPENDENT_AMBULATORY_CARE_PROVIDER_SITE_OTHER): Payer: Medicaid Other | Admitting: Certified Nurse Midwife

## 2016-10-01 ENCOUNTER — Other Ambulatory Visit: Payer: Medicaid Other

## 2016-10-01 VITALS — BP 116/73 | HR 101 | Wt 198.8 lb

## 2016-10-01 DIAGNOSIS — R7989 Other specified abnormal findings of blood chemistry: Secondary | ICD-10-CM

## 2016-10-01 DIAGNOSIS — R7309 Other abnormal glucose: Secondary | ICD-10-CM

## 2016-10-01 DIAGNOSIS — E559 Vitamin D deficiency, unspecified: Secondary | ICD-10-CM

## 2016-10-01 DIAGNOSIS — Z3482 Encounter for supervision of other normal pregnancy, second trimester: Secondary | ICD-10-CM

## 2016-10-01 DIAGNOSIS — E669 Obesity, unspecified: Secondary | ICD-10-CM

## 2016-10-01 DIAGNOSIS — Z23 Encounter for immunization: Secondary | ICD-10-CM

## 2016-10-01 DIAGNOSIS — Z3480 Encounter for supervision of other normal pregnancy, unspecified trimester: Secondary | ICD-10-CM

## 2016-10-01 MED ORDER — VITAMIN D (ERGOCALCIFEROL) 1.25 MG (50000 UNIT) PO CAPS: 50000.0000 [IU] | ORAL_CAPSULE | ORAL | 2 refills | Status: DC

## 2016-10-01 MED ORDER — PROVIDA DHA 16-16-1.25-110 MG PO CAPS: 1.0000 | ORAL_CAPSULE | Freq: Every day | ORAL | 12 refills | Status: DC

## 2016-10-01 NOTE — Progress Notes (Signed)
   PRENATAL VISIT NOTE  Subjective:  Elizabeth Warren is a 36 y.o. O2V0350 at [redacted]w[redacted]d being seen today for ongoing prenatal care.  She is currently monitored for the following issues for this low-risk pregnancy and has Obesity (BMI 30-39.9); Supervision of normal pregnancy, antepartum; Low vitamin D level; and Elevated hemoglobin A1c on her problem list.  Patient reports no complaints.  Contractions: Not present. Vag. Bleeding: None.  Movement: Present. Denies leaking of fluid.   The following portions of the patient's history were reviewed and updated as appropriate: allergies, current medications, past family history, past medical history, past social history, past surgical history and problem list. Problem list updated.  Objective:   Vitals:   10/01/16 0820  BP: 116/73  Pulse: (!) 101  Weight: 198 lb 12.8 oz (90.2 kg)    Fetal Status: Fetal Heart Rate (bpm): 155   Movement: Present     General:  Alert, oriented and cooperative. Patient is in no acute distress.  Skin: Skin is warm and dry. No rash noted.   Cardiovascular: Normal heart rate noted  Respiratory: Normal respiratory effort, no problems with respiration noted  Abdomen: Soft, gravid, appropriate for gestational age.  Pain/Pressure: Absent     Pelvic: Cervical exam deferred        Extremities: Normal range of motion.  Edema: None  Mental Status:  Normal mood and affect. Normal behavior. Normal judgment and thought content.   Assessment and Plan:  Pregnancy: G5P4004 at [redacted]w[redacted]d  1. Low vitamin D level     Taking weekly vitamin D  2. Elevated hemoglobin A1c     Early 2 hour OGTT today  3. Supervision of other normal pregnancy, antepartum      Doing well - AFP, Serum, Open Spina Bifida  4. Obesity (BMI 30-39.9)      Has gained 5 lbs this pregnancy.  Preterm labor symptoms and general obstetric precautions including but not limited to vaginal bleeding, contractions, leaking of fluid and fetal movement were reviewed in  detail with the patient. Please refer to After Visit Summary for other counseling recommendations.  Return in about 4 weeks (around 10/29/2016) for ROB.   Roe Coombs, CNM

## 2016-10-01 NOTE — Progress Notes (Signed)
Fetal Movement decreased.

## 2016-10-01 NOTE — Addendum Note (Signed)
Addended by: Maretta Bees on: 10/01/2016 01:05 PM   Modules accepted: Orders

## 2016-10-02 ENCOUNTER — Other Ambulatory Visit: Payer: Self-pay | Admitting: Certified Nurse Midwife

## 2016-10-02 ENCOUNTER — Telehealth: Payer: Self-pay

## 2016-10-02 ENCOUNTER — Encounter: Payer: Self-pay | Admitting: Certified Nurse Midwife

## 2016-10-02 DIAGNOSIS — Z8632 Personal history of gestational diabetes: Secondary | ICD-10-CM | POA: Insufficient documentation

## 2016-10-02 DIAGNOSIS — O0990 Supervision of high risk pregnancy, unspecified, unspecified trimester: Secondary | ICD-10-CM

## 2016-10-02 DIAGNOSIS — O24112 Pre-existing diabetes mellitus, type 2, in pregnancy, second trimester: Secondary | ICD-10-CM

## 2016-10-02 HISTORY — DX: Pre-existing type 2 diabetes mellitus, in pregnancy, second trimester: O24.112

## 2016-10-02 LAB — GLUCOSE TOLERANCE, 2 HOURS W/ 1HR
GLUCOSE, 1 HOUR: 205 mg/dL — AB (ref 65–179)
GLUCOSE, 2 HOUR: 202 mg/dL — AB (ref 65–152)
Glucose, Fasting: 96 mg/dL — ABNORMAL HIGH (ref 65–91)

## 2016-10-02 MED ORDER — ACCU-CHEK FASTCLIX LANCET KIT: 1.0000 | PACK | Freq: Four times a day (QID) | 12 refills | Status: DC

## 2016-10-02 MED ORDER — ACCU-CHEK GUIDE W/DEVICE KIT: 1.0000 | PACK | Freq: Four times a day (QID) | 0 refills | Status: DC

## 2016-10-02 MED ORDER — GLUCOSE BLOOD VI STRP: ORAL_STRIP | 12 refills | Status: DC

## 2016-10-02 NOTE — Telephone Encounter (Signed)
Advised of glucose results, referrals and management.

## 2016-10-04 ENCOUNTER — Other Ambulatory Visit: Payer: Self-pay | Admitting: Certified Nurse Midwife

## 2016-10-04 DIAGNOSIS — O0990 Supervision of high risk pregnancy, unspecified, unspecified trimester: Secondary | ICD-10-CM

## 2016-10-04 LAB — AFP, SERUM, OPEN SPINA BIFIDA
AFP MoM: 1.15
AFP Value: 29.8 ng/mL
Gest. Age on Collection Date: 15.7 weeks
MATERNAL AGE AT EDD: 34.7 a
OSBR Risk 1 IN: 7603
TEST RESULTS AFP: NEGATIVE
Weight: 199 [lb_av]

## 2016-10-07 ENCOUNTER — Emergency Department (HOSPITAL_COMMUNITY)
Admission: EM | Admit: 2016-10-07 | Discharge: 2016-10-07 | Disposition: A | Discharge status: Home / Self Care | Payer: Medicaid Other | Attending: Emergency Medicine | Admitting: Emergency Medicine

## 2016-10-07 ENCOUNTER — Encounter (HOSPITAL_COMMUNITY): Payer: Self-pay | Admitting: Emergency Medicine

## 2016-10-07 DIAGNOSIS — L853 Xerosis cutis: Secondary | ICD-10-CM | POA: Insufficient documentation

## 2016-10-07 DIAGNOSIS — E119 Type 2 diabetes mellitus without complications: Secondary | ICD-10-CM | POA: Diagnosis not present

## 2016-10-07 DIAGNOSIS — Z79899 Other long term (current) drug therapy: Secondary | ICD-10-CM | POA: Insufficient documentation

## 2016-10-07 DIAGNOSIS — R21 Rash and other nonspecific skin eruption: Secondary | ICD-10-CM | POA: Diagnosis present

## 2016-10-07 NOTE — ED Triage Notes (Signed)
Pt c/o rash and itching to bilateral feet that began today. Denies any known allergies. Reports that she is 4mos pregnant.

## 2016-10-07 NOTE — ED Provider Notes (Signed)
[facility] DEPT Provider Note   CSN: [id number] Arrival date & time: 10/07/16  2033     History   Chief Complaint Chief Complaint  Patient presents with  . Rash    HPI Elizabeth Warren is a 36 y.o. female, G5 P4 at 16wk4d, sending to the ED for acute onset of bilateral foot itching. Patient states feet intermittently itch, located between her first and second toes on both feet. She states skin is white in this area. Has not tried any medications or topical therapy. Denies edema, purulent drainage, fever, or any other symptoms today. The history is provided by the patient.    Past Medical History:  Diagnosis Date  . NVD (normal vaginal delivery) 09/20/2010  . Pre-existing type 2 diabetes mellitus during pregnancy in second trimester 10/02/2016    Patient Active Problem List   Diagnosis Date Noted  . Pre-existing type 2 diabetes mellitus during pregnancy in second trimester 10/02/2016  . Elevated hemoglobin A1c 09/06/2016  . Low vitamin D level 09/03/2016  . Supervision of high risk pregnancy, antepartum 09/02/2016  . Obesity (BMI 30-39.9) 10/31/2011    Past Surgical History:  Procedure Laterality Date  . NO PAST SURGERIES      OB History    Gravida Para Term Preterm AB Living   _0 0 0 4   SAB TAB Ectopic Multiple Live Births   0 0 0 0 4       Home Medications    Prior to Admission medications   Medication Sig Start Date End Date Taking? Authorizing Provider  Blood Glucose Monitoring Suppl (ACCU-CHEK GUIDE) w/Device KIT 1 kit by Does not apply route 4 (four) times daily. 10/02/16   Kandis Cocking A, CNM  glucose blood (ACCU-CHEK GUIDE) test strip 4 times daily 10/02/16   Denney, Rachelle A, CNM  Lancets Misc. (ACCU-CHEK FASTCLIX LANCET) KIT 1 Device by Does not apply route 4 (four) times daily. 10/02/16   Kandis Cocking A, CNM  Multiple Vitamin (MULTIVITAMIN WITH MINERALS) TABS tablet Take 1 tablet by mouth daily after lunch.    [provider]    Prenat-FeFum-FePo-FA-DHA w/o A (PROVIDA DHA) 16-16-1.25-110 MG CAPS Take 1 tablet by mouth daily. 10/01/16   Morene Crocker, CNM  Prenatal Vit-Fe Fumarate-FA (PRENATAL ONE DAILY PO) Take by mouth.    [provider]  promethazine (PHENERGAN) 12.5 MG tablet Take 1 tablet (12.5 mg total) by mouth every 6 (six) hours as needed for nausea or vomiting. Take a second tablet if still nauseous. Patient not taking: Reported on 09/02/2016 08/13/16   [place]um[place], Lauralyn Primes, DO  Vitamin D, Ergocalciferol, (DRISDOL) 50000 units CAPS capsule Take 1 capsule (50,000 Units total) by mouth every 7 (seven) days. 10/01/16   Morene Crocker, CNM    Family History Family History  Problem Relation Age of Onset  . Diabetes Mother   . Hypertension Mother   . Diabetes Father   . Hypertension Father   . Diabetes Maternal Grandmother   . Hypertension Maternal Grandmother   . Diabetes Maternal Grandfather   . Hypertension Maternal Grandfather   . Diabetes Paternal Grandmother   . Hypertension Paternal Grandmother   . Diabetes Paternal Grandfather   . Hypertension Paternal Grandfather   . Cancer Neg Hx   . Heart disease Neg Hx   . Stroke Neg Hx     Social History Social History  Substance Use Topics  . Smoking status: Never Smoker  . Smokeless tobacco: Never Used  . Alcohol  use No     Allergies   Patient has no known allergies.   Review of Systems Review of Systems  Constitutional: Negative for fever.  Skin: Positive for color change.     Physical Exam Updated Vital Signs BP 110/82 (BP Location: Right Arm)   Pulse 100   Temp 98.5 F (36.9 C) (Oral)   Resp 16   LMP 12/21/2015   SpO2 100%   Physical Exam  Constitutional: She appears well-developed and well-nourished. No distress.  HENT:  Head: Normocephalic and atraumatic.  Eyes: Conjunctivae are normal.  Cardiovascular: Normal rate and intact distal pulses.   Pulmonary/Chest: Effort normal.  Skin:  Bilateral  interdigit space between 1st and 2nd toes with dry skin. No erythema, no rash, no burrowing, no tenderness. Skin appears normal with the exception of dryness.  Psychiatric: She has a normal mood and affect. Her behavior is normal.  Nursing note and vitals reviewed.     ED Treatments / Results  Labs (all labs ordered are listed, but only abnormal results are displayed) Labs Reviewed - No data to display  EKG  EKG Interpretation None       Radiology No results found.  Procedures Procedures (including critical care time)  Medications Ordered in ED Medications - No data to display   Initial Impression / Assessment and Plan / ED Course  I have reviewed the triage vital signs and the nursing notes.  Pertinent labs & imaging results that were available during my care of the patient were reviewed by me and considered in my medical decision making (see chart for details).    Patient with dry skin to bilateral feet. No blurring, no erythema, no tenderness, no purulence or signs of surrounding infection. No rash or skin disruption. Recommend patient to apply Vaseline or lotion. Patient to follow up with PCP if symptoms persist. Patient afebrile, not in distress, safe for discharge.  Discussed results, findings, treatment and follow up. Patient advised of return precautions. Patient verbalized understanding and agreed with plan.   Final Clinical Impressions(s) / ED Diagnoses   Final diagnoses:  Dry skin    New Prescriptions Discharge Medication List as of 10/07/2016  9:39 PM       Russo, [place] N, PA-C 10/08/16 0004    Pattricia Boss, MD 10/10/16 1006

## 2016-10-07 NOTE — ED Notes (Signed)
Patient Alert and oriented X4. Stable and ambulatory. Patient verbalized understanding of the discharge instructions.  Patient belongings were taken by the patient.  

## 2016-10-07 NOTE — Discharge Instructions (Signed)
Apply lotion or vaseline to your feet. This should provide relief of your symptoms. Follow up with your primary care, Dr. Ottie GlazierGunadasa, if symptoms persist.

## 2016-10-11 ENCOUNTER — Encounter (HOSPITAL_COMMUNITY): Payer: Self-pay | Admitting: *Deleted

## 2016-10-11 ENCOUNTER — Encounter: Payer: Medicaid Other | Attending: Certified Nurse Midwife | Admitting: Registered"

## 2016-10-11 ENCOUNTER — Encounter: Payer: Self-pay | Admitting: Registered"

## 2016-10-11 ENCOUNTER — Inpatient Hospital Stay (HOSPITAL_COMMUNITY)
Admission: AD | Admit: 2016-10-11 | Discharge: 2016-10-11 | Disposition: A | Discharge status: Home / Self Care | Payer: Medicaid Other | Source: Ambulatory Visit | Attending: Obstetrics and Gynecology | Admitting: Obstetrics and Gynecology

## 2016-10-11 ENCOUNTER — Inpatient Hospital Stay (HOSPITAL_COMMUNITY): Payer: Medicaid Other

## 2016-10-11 DIAGNOSIS — R109 Unspecified abdominal pain: Secondary | ICD-10-CM | POA: Diagnosis not present

## 2016-10-11 DIAGNOSIS — O26899 Other specified pregnancy related conditions, unspecified trimester: Secondary | ICD-10-CM

## 2016-10-11 DIAGNOSIS — J029 Acute pharyngitis, unspecified: Secondary | ICD-10-CM | POA: Diagnosis not present

## 2016-10-11 DIAGNOSIS — R51 Headache: Secondary | ICD-10-CM | POA: Insufficient documentation

## 2016-10-11 DIAGNOSIS — R1030 Lower abdominal pain, unspecified: Secondary | ICD-10-CM | POA: Diagnosis present

## 2016-10-11 DIAGNOSIS — Z3A17 17 weeks gestation of pregnancy: Secondary | ICD-10-CM

## 2016-10-11 DIAGNOSIS — Z3A00 Weeks of gestation of pregnancy not specified: Secondary | ICD-10-CM | POA: Diagnosis not present

## 2016-10-11 DIAGNOSIS — Z3492 Encounter for supervision of normal pregnancy, unspecified, second trimester: Secondary | ICD-10-CM

## 2016-10-11 DIAGNOSIS — O26892 Other specified pregnancy related conditions, second trimester: Secondary | ICD-10-CM | POA: Insufficient documentation

## 2016-10-11 DIAGNOSIS — R7309 Other abnormal glucose: Secondary | ICD-10-CM

## 2016-10-11 DIAGNOSIS — O24112 Pre-existing diabetes mellitus, type 2, in pregnancy, second trimester: Secondary | ICD-10-CM | POA: Insufficient documentation

## 2016-10-11 DIAGNOSIS — O0992 Supervision of high risk pregnancy, unspecified, second trimester: Secondary | ICD-10-CM | POA: Diagnosis not present

## 2016-10-11 DIAGNOSIS — O4692 Antepartum hemorrhage, unspecified, second trimester: Secondary | ICD-10-CM

## 2016-10-11 DIAGNOSIS — R519 Headache, unspecified: Secondary | ICD-10-CM

## 2016-10-11 DIAGNOSIS — Z713 Dietary counseling and surveillance: Secondary | ICD-10-CM | POA: Insufficient documentation

## 2016-10-11 LAB — URINALYSIS, ROUTINE W REFLEX MICROSCOPIC
Bilirubin Urine: NEGATIVE
GLUCOSE, UA: NEGATIVE mg/dL
HGB URINE DIPSTICK: NEGATIVE
Ketones, ur: NEGATIVE mg/dL
NITRITE: NEGATIVE
Protein, ur: NEGATIVE mg/dL
SPECIFIC GRAVITY, URINE: 1.019 (ref 1.005–1.030)
pH: 6 (ref 5.0–8.0)

## 2016-10-11 LAB — RAPID STREP SCREEN (MED CTR MEBANE ONLY): STREPTOCOCCUS, GROUP A SCREEN (DIRECT): NEGATIVE

## 2016-10-11 MED ORDER — CYCLOBENZAPRINE HCL 10 MG PO TABS: 10.0000 mg | ORAL_TABLET | Freq: Two times a day (BID) | ORAL | 0 refills | Status: DC | PRN

## 2016-10-11 MED ORDER — IBUPROFEN 600 MG PO TABS
600.0000 mg | ORAL_TABLET | Freq: Once | ORAL | Status: AC
  Administered 2016-10-11: 600 mg via ORAL
  Filled 2016-10-11: qty 1

## 2016-10-11 NOTE — MAU Note (Signed)
[facility]  patient not in the room.

## 2016-10-11 NOTE — MAU Note (Signed)
Urine in the lab  

## 2016-10-11 NOTE — MAU Provider Note (Signed)
History     CSN: [id number]  Arrival date and time: 10/11/16 1628  First Provider Initiated Contact with Patient 10/11/16 1703      Chief Complaint  Patient presents with  . Abdominal Pain  . Vaginal Bleeding  . Headache  . Sore Throat   HPI Elizabeth Warren is a 36 y.o. E5U3149 at 64w1dwho presents with multiple complaints.  Reports lower abdominal pain that radiates to her lower back since yesterday. Pain is intermittent. Rates pain 8/10. Has not treated. Denies n/v/d or constipation. Had red spotting on toilet paper yesterday. No bleeding into pad & no clots. Bleeding has not continued. Last intercourse 2+ weeks ago.  Headache since yesterday. Bilateral frontal headache that is throbbing. Rates pain 9/10. Has been taking tylenol without relief. Thinks headache is due to her sore throat. Denies fever/chills, sick contacts, cough, SOB, or ear pain.   OB History    Gravida Para Term Preterm AB Living   _0 0 0 4   SAB TAB Ectopic Multiple Live Births   0 0 0 0 4      Past Medical History:  Diagnosis Date  . NVD (normal vaginal delivery) 09/20/2010  . Pre-existing type 2 diabetes mellitus during pregnancy in second trimester 10/02/2016    Past Surgical History:  Procedure Laterality Date  . NO PAST SURGERIES      Family History  Problem Relation Age of Onset  . Diabetes Mother   . Hypertension Mother   . Diabetes Father   . Hypertension Father   . Diabetes Maternal Grandmother   . Hypertension Maternal Grandmother   . Diabetes Maternal Grandfather   . Hypertension Maternal Grandfather   . Diabetes Paternal Grandmother   . Hypertension Paternal Grandmother   . Diabetes Paternal Grandfather   . Hypertension Paternal Grandfather   . Cancer Neg Hx   . Heart disease Neg Hx   . Stroke Neg Hx     Social History  Substance Use Topics  . Smoking status: Never Smoker  . Smokeless tobacco: Never Used  . Alcohol use No    Allergies: No Known  Allergies  Prescriptions Prior to Admission  Medication Sig Dispense Refill Last Dose  . Blood Glucose Monitoring Suppl (ACCU-CHEK GUIDE) w/Device KIT 1 kit by Does not apply route 4 (four) times daily. 1 kit 0   . glucose blood (ACCU-CHEK GUIDE) test strip 4 times daily 100 each 12   . Lancets Misc. (ACCU-CHEK FASTCLIX LANCET) KIT 1 Device by Does not apply route 4 (four) times daily. 1 kit 12   . Multiple Vitamin (MULTIVITAMIN WITH MINERALS) TABS tablet Take 1 tablet by mouth daily after lunch.   Taking  . Prenat-FeFum-FePo-FA-DHA w/o A (PROVIDA DHA) 16-16-1.25-110 MG CAPS Take 1 tablet by mouth daily. 30 capsule 12 Taking  . Prenatal Vit-Fe Fumarate-FA (PRENATAL ONE DAILY PO) Take by mouth.   Not Taking  . promethazine (PHENERGAN) 12.5 MG tablet Take 1 tablet (12.5 mg total) by mouth every 6 (six) hours as needed for nausea or vomiting. Take a second tablet if still nauseous. (Patient not taking: Reported on 09/02/2016) 30 tablet 0 Not Taking  . Vitamin D, Ergocalciferol, (DRISDOL) 50000 units CAPS capsule Take 1 capsule (50,000 Units total) by mouth every 7 (seven) days. 30 capsule 2 Taking    Review of Systems  Constitutional: Negative for chills and fever.  HENT: Positive for sore throat. Negative for ear pain, sinus pain and sneezing.   Eyes: Negative for  photophobia.  Respiratory: Negative for cough.   Gastrointestinal: Positive for abdominal pain. Negative for constipation, diarrhea, nausea and vomiting.  Genitourinary: Positive for vaginal bleeding (none today). Negative for dysuria and vaginal discharge.  Musculoskeletal: Positive for back pain.  Neurological: Positive for headaches.   Physical Exam   Blood pressure 114/71, pulse 89, temperature 98.9 F (37.2 C), temperature source Oral, last menstrual period 12/21/2015.  Physical Exam  Nursing note and vitals reviewed. Constitutional: She is oriented to person, place, and time. She appears well-developed and well-nourished.  No distress.  HENT:  Head: Normocephalic and atraumatic.  Mouth/Throat: Mucous membranes are normal. Posterior oropharyngeal erythema present. No oropharyngeal exudate, posterior oropharyngeal edema or tonsillar abscesses.  Eyes: Conjunctivae are normal. Right eye exhibits no discharge. Left eye exhibits no discharge. No scleral icterus.  Neck: Normal range of motion.  Cardiovascular: Normal rate, regular rhythm and normal heart sounds.   No murmur heard. Respiratory: Effort normal and breath sounds normal. No respiratory distress. She has no wheezes.  GI: Soft. There is no tenderness.  Neurological: She is alert and oriented to person, place, and time.  Skin: Skin is warm and dry. She is not diaphoretic.  Psychiatric: She has a normal mood and affect. Her behavior is normal. Judgment and thought content normal.    MAU Course  Procedures Results for orders placed or performed during the hospital encounter of 10/11/16 (from the past 24 hour(s))  Urinalysis, Routine w reflex microscopic     Status: Abnormal   Collection Time: 10/11/16  4:40 PM  Result Value Ref Range   Color, Urine YELLOW YELLOW   APPearance CLOUDY (A) CLEAR   Specific Gravity, Urine 1.019 1.005 - 1.030   pH 6.0 5.0 - 8.0   Glucose, UA NEGATIVE NEGATIVE mg/dL   Hgb urine dipstick NEGATIVE NEGATIVE   Bilirubin Urine NEGATIVE NEGATIVE   Ketones, ur NEGATIVE NEGATIVE mg/dL   Protein, ur NEGATIVE NEGATIVE mg/dL   Nitrite NEGATIVE NEGATIVE   Leukocytes, UA LARGE (A) NEGATIVE   RBC / HPF 0-5 0 - 5 RBC/hpf   WBC, UA 6-30 0 - 5 WBC/hpf   Bacteria, UA MANY (A) NONE SEEN   Squamous Epithelial / LPF TOO NUMEROUS TO COUNT (A) NONE SEEN   Mucus PRESENT    No results found.  MDM FHT 158 Strep swab collected from throat VSS, NAD Patient driving, given dose of ibuprofen for headache -- reports improvement in head & abdominal pain A positive No bleeding today Ultrasound shows IUP, no evidence of previa or abruption,  normal cervical length. Cervix closed.  Assessment and Plan  A: 1. Abdominal pain affecting pregnancy   2. [redacted] weeks gestation of pregnancy   3. Vaginal bleeding in pregnancy, second trimester   4. Fetal heart tones present, second trimester   5. Acute pharyngitis, unspecified etiology   6. Pregnancy headache in second trimester    P: Discharge home Rx flexeril Discussed reasons to return to MAU Keep follow up appointment with OB/PCP  Strep swab & urine culture pending   Jorje Guild 10/11/2016, 5:02 PM

## 2016-10-11 NOTE — MAU Note (Signed)
Pt. Here for vaginal bleeding and lower abd. pain that started three days ago, but no bleeding today. Pain, 8/10.  Also, headache and throat pain that started yesterday.

## 2016-10-11 NOTE — Discharge Instructions (Signed)
Vaginal Bleeding During Pregnancy, Second Trimester A small amount of bleeding (spotting) from the vagina is relatively common in pregnancy. It usually stops on its own. Various things can cause bleeding or spotting in pregnancy. Some bleeding may be related to the pregnancy, and some may not. Sometimes the bleeding is normal and is not a problem. However, bleeding can also be a sign of something serious. Be sure to tell your health care provider about any vaginal bleeding right away. Some possible causes of vaginal bleeding during the second trimester include:  Infection, inflammation, or growths on the cervix.  The placenta may be partially or completely covering the opening of the cervix inside the uterus (placenta previa).  The placenta may have separated from the uterus (abruption of the placenta).  You may be having early (preterm) labor.  The cervix may not be strong enough to keep a baby inside the uterus (cervical insufficiency).  Tiny cysts may have developed in the uterus instead of pregnancy tissue (molar pregnancy).  Follow these instructions at home: Watch your condition for any changes. The following actions may help to lessen any discomfort you are feeling:  Follow your health care provider's instructions for limiting your activity. If your health care provider orders bed rest, you may need to stay in bed and only get up to use the bathroom. However, your health care provider may allow you to continue light activity.  If needed, make plans for someone to help with your regular activities and responsibilities while you are on bed rest.  Keep track of the number of pads you use each day, how often you change pads, and how soaked (saturated) they are. Write this down.  Do not use tampons. Do not douche.  Do not have sexual intercourse or orgasms until approved by your health care provider.  If you pass any tissue from your vagina, save the tissue so you can show it to your  health care provider.  Only take over-the-counter or prescription medicines as directed by your health care provider.  Do not take aspirin because it can make you bleed.  Do not exercise or perform any strenuous activities or heavy lifting without your health care provider's permission.  Keep all follow-up appointments as directed by your health care provider.  Contact a health care provider if:  You have any vaginal bleeding during any part of your pregnancy.  You have cramps or labor pains.  You have a fever, not controlled by medicine. Get help right away if:  You have severe cramps in your back or belly (abdomen).  You have contractions.  You have chills.  You pass large clots or tissue from your vagina.  Your bleeding increases.  You feel light-headed or weak, or you have fainting episodes.  You are leaking fluid or have a gush of fluid from your vagina. This information is not intended to replace advice given to you by your health care provider. Make sure you discuss any questions you have with your health care provider. Document Released: 10/31/2004 Document Revised: 06/29/2015 Document Reviewed: 09/28/2012 Elsevier Interactive Patient Education  2018 [employer] General Headache Without Cause A headache is pain or discomfort felt around the head or neck area. The specific cause of a headache may not be found. There are many causes and types of headaches. A few common ones are:  Tension headaches.  Migraine headaches.  Cluster headaches.  Chronic daily headaches.  Follow these instructions at home: Watch your condition for any changes. Take  these steps to help with your condition: Managing pain  Take over-the-counter and prescription medicines only as told by your health care provider.  Lie down in a dark, quiet room when you have a headache.  If directed, apply ice to the head and neck area: ? Put ice in a plastic bag. ? Place a towel between your  skin and the bag. ? Leave the ice on for 20 minutes, 2-3 times per day.  Use a heating pad or hot shower to apply heat to the head and neck area as told by your health care provider.  Keep lights dim if bright lights bother you or make your headaches worse. Eating and drinking  Eat meals on a regular schedule.  Limit alcohol use.  Decrease the amount of caffeine you drink, or stop drinking caffeine. General instructions  Keep all follow-up visits as told by your health care provider. This is important.  Keep a headache journal to help find out what may trigger your headaches. For example, write down: ? What you eat and drink. ? How much sleep you get. ? Any change to your diet or medicines.  Try massage or other relaxation techniques.  Limit stress.  Sit up straight, and do not tense your muscles.  Do not use tobacco products, including cigarettes, chewing tobacco, or e-cigarettes. If you need help quitting, ask your health care provider.  Exercise regularly as told by your health care provider.  Sleep on a regular schedule. Get 7-9 hours of sleep, or the amount recommended by your health care provider. Contact a health care provider if:  Your symptoms are not helped by medicine.  You have a headache that is different from the usual headache.  You have nausea or you vomit.  You have a fever. Get help right away if:  Your headache becomes severe.  You have repeated vomiting.  You have a stiff neck.  You have a loss of vision.  You have problems with speech.  You have pain in the eye or ear.  You have muscular weakness or loss of muscle control.  You lose your balance or have trouble walking.  You feel faint or pass out.  You have confusion. This information is not intended to replace advice given to you by your health care provider. Make sure you discuss any questions you have with your health care provider. Document Released: 01/21/2005 Document  Revised: 06/29/2015 Document Reviewed: 05/16/2014 Elsevier Interactive Patient Education  2017 [employer].

## 2016-10-11 NOTE — Progress Notes (Signed)
Patient was seen on 10/11/16 for Gestational Diabetes self-management education at the [facility]. The following learning objectives were met by the patient during this course:   States the definition of Gestational Diabetes  States why dietary management is important in controlling blood glucose  Describes the effects each nutrient has on blood glucose levels  Demonstrates ability to create a balanced meal plan  Demonstrates carbohydrate counting   States when to check blood glucose levels  Demonstrates proper blood glucose monitoring techniques  States the effect of stress and exercise on blood glucose levels  States the importance of limiting caffeine and abstaining from alcohol and smoking  Blood glucose monitor given: none Lot # n/a Exp: n/a Blood glucose reading: n/a  Patient instructed to monitor glucose levels: FBS: 60 - <95 1 hour: <140 2 hour: <120  Patient received handouts:  Nutrition Diabetes and Pregnancy  Carbohydrate Counting List  EPA/FDA safe fish list  Patient will be seen for follow-up as needed.

## 2016-10-13 ENCOUNTER — Encounter: Payer: Self-pay | Admitting: Student

## 2016-10-13 DIAGNOSIS — R8271 Bacteriuria: Secondary | ICD-10-CM | POA: Insufficient documentation

## 2016-10-13 LAB — CULTURE, OB URINE

## 2016-10-14 LAB — CULTURE, GROUP A STREP (THRC)

## 2016-10-29 ENCOUNTER — Ambulatory Visit (INDEPENDENT_AMBULATORY_CARE_PROVIDER_SITE_OTHER): Payer: Medicaid Other | Admitting: Certified Nurse Midwife

## 2016-10-29 VITALS — BP 129/89 | HR 110 | Wt 189.9 lb

## 2016-10-29 DIAGNOSIS — O0990 Supervision of high risk pregnancy, unspecified, unspecified trimester: Secondary | ICD-10-CM

## 2016-10-29 DIAGNOSIS — O24112 Pre-existing diabetes mellitus, type 2, in pregnancy, second trimester: Secondary | ICD-10-CM

## 2016-10-29 MED ORDER — ASPIRIN 81 MG PO CHEW: 81.0000 mg | CHEWABLE_TABLET | Freq: Every day | ORAL | 12 refills | Status: DC

## 2016-10-29 NOTE — Progress Notes (Signed)
   PRENATAL VISIT NOTE  Subjective:  Elizabeth Warren is a 36 y.o. [id number] at [redacted]w[redacted]d being seen today for ongoing prenatal care.  She is currently monitored for the following issues for this high-risk pregnancy and has Obesity (BMI 30-39.9); Supervision of high risk pregnancy, antepartum; Low vitamin D level; Elevated hemoglobin A1c; Pre-existing type 2 diabetes mellitus during pregnancy in second trimester; and GBS bacteriuria on her problem list.  Patient reports no complaints.  Contractions: Not present. Vag. Bleeding: None.  Movement: Present. Denies leaking of fluid.   The following portions of the patient's history were reviewed and updated as appropriate: allergies, current medications, past family history, past medical history, past social history, past surgical history and problem list. Problem list updated.  Objective:   Vitals:   10/29/16 0852  BP: 129/89  Pulse: (!) 110  Weight: 189 lb 14.4 oz (86.1 kg)    Fetal Status: Fetal Heart Rate (bpm): 156; doppler Fundal Height: 20 cm Movement: Present     General:  Alert, oriented and cooperative. Patient is in no acute distress.  Skin: Skin is warm and dry. No rash noted.   Cardiovascular: Normal heart rate noted  Respiratory: Normal respiratory effort, no problems with respiration noted  Abdomen: Soft, gravid, appropriate for gestational age.  Pain/Pressure: Absent     Pelvic: Cervical exam deferred        Extremities: Normal range of motion.  Edema: None  Mental Status:  Normal mood and affect. Normal behavior. Normal judgment and thought content.   Assessment and Plan:  Pregnancy: G5P4004 at [redacted]w[redacted]d  1. Supervision of high risk pregnancy, antepartum      BTL paperwork completed.   - AFP, Serum, Open Spina Bifida - [place] MFM OB DETAIL +14 WK; Future - aspirin 81 MG chewable tablet; Chew 1 tablet (81 mg total) by mouth daily.  Dispense: 30 tablet; Refill: 12  2. Pre-existing type 2 diabetes mellitus during pregnancy in second  trimester     Blood sugar logs given to patient with instructions.  Has been to DM teaching on 10/11/16; has not been recording her blood sugars.   - Ambulatory referral to Ophthalmology  Preterm labor symptoms and general obstetric precautions including but not limited to vaginal bleeding, contractions, leaking of fluid and fetal movement were reviewed in detail with the patient. Please refer to After Visit Summary for other counseling recommendations.  Return in about 2 weeks (around 11/12/2016) for [facility], Needs to see FP MD here, Female providers only.   Roe Coombs, CNM

## 2016-10-29 NOTE — Progress Notes (Signed)
Patient reports feeling a little bit of fetal movement, denies pain.

## 2016-10-30 ENCOUNTER — Other Ambulatory Visit: Payer: Self-pay | Admitting: Certified Nurse Midwife

## 2016-10-30 ENCOUNTER — Ambulatory Visit (HOSPITAL_COMMUNITY)
Admission: RE | Admit: 2016-10-30 | Discharge: 2016-10-30 | Disposition: A | Discharge status: Home / Self Care | Payer: Medicaid Other | Source: Ambulatory Visit | Attending: Certified Nurse Midwife | Admitting: Certified Nurse Midwife

## 2016-10-30 DIAGNOSIS — O24112 Pre-existing diabetes mellitus, type 2, in pregnancy, second trimester: Secondary | ICD-10-CM

## 2016-10-30 DIAGNOSIS — Z3A19 19 weeks gestation of pregnancy: Secondary | ICD-10-CM | POA: Insufficient documentation

## 2016-10-30 DIAGNOSIS — O0990 Supervision of high risk pregnancy, unspecified, unspecified trimester: Secondary | ICD-10-CM

## 2016-11-01 ENCOUNTER — Other Ambulatory Visit: Payer: Self-pay | Admitting: Certified Nurse Midwife

## 2016-11-01 LAB — AFP, SERUM, OPEN SPINA BIFIDA
AFP MoM: 1.14
AFP VALUE AFPOSL: 51.7 ng/mL
Gest. Age on Collection Date: 19.5 weeks
Maternal Age At EDD: 34.7 yr
OSBR Risk 1 IN: 7850
Test Results:: NEGATIVE
Weight: 189 [lb_av]

## 2016-11-12 ENCOUNTER — Ambulatory Visit (INDEPENDENT_AMBULATORY_CARE_PROVIDER_SITE_OTHER): Payer: Medicaid Other | Admitting: Certified Nurse Midwife

## 2016-11-12 VITALS — BP 126/86 | HR 96 | Wt 186.4 lb

## 2016-11-12 DIAGNOSIS — O0990 Supervision of high risk pregnancy, unspecified, unspecified trimester: Secondary | ICD-10-CM

## 2016-11-12 DIAGNOSIS — Z3480 Encounter for supervision of other normal pregnancy, unspecified trimester: Secondary | ICD-10-CM

## 2016-11-12 DIAGNOSIS — O0992 Supervision of high risk pregnancy, unspecified, second trimester: Secondary | ICD-10-CM

## 2016-11-12 DIAGNOSIS — O24112 Pre-existing diabetes mellitus, type 2, in pregnancy, second trimester: Secondary | ICD-10-CM

## 2016-11-12 DIAGNOSIS — R7989 Other specified abnormal findings of blood chemistry: Secondary | ICD-10-CM

## 2016-11-12 MED ORDER — VITAMIN D (ERGOCALCIFEROL) 1.25 MG (50000 UNIT) PO CAPS: 50000.0000 [IU] | ORAL_CAPSULE | ORAL | 5 refills | Status: DC

## 2016-11-12 MED ORDER — ACCU-CHEK FASTCLIX LANCETS MISC: 1.0000 | Freq: Four times a day (QID) | 9 refills | Status: DC

## 2016-11-12 MED ORDER — GLYBURIDE 2.5 MG PO TABS: 2.5000 mg | ORAL_TABLET | Freq: Two times a day (BID) | ORAL | 3 refills | Status: DC

## 2016-11-12 MED ORDER — PROVIDA DHA 16-16-1.25-110 MG PO CAPS: 1.0000 | ORAL_CAPSULE | Freq: Every day | ORAL | 12 refills | Status: DC

## 2016-11-12 MED ORDER — ASPIRIN 81 MG PO CHEW: 81.0000 mg | CHEWABLE_TABLET | Freq: Every day | ORAL | 12 refills | Status: DC

## 2016-11-12 MED ORDER — GLUCOSE BLOOD VI STRP: ORAL_STRIP | 9 refills | Status: DC

## 2016-11-12 NOTE — Progress Notes (Signed)
   PRENATAL VISIT NOTE  Subjective:  Elizabeth Warren is a 36 y.o. [id number] at [redacted]w[redacted]d being seen today for ongoing prenatal care.  She is currently monitored for the following issues for this high-risk pregnancy and has Obesity (BMI 30-39.9); Supervision of high risk pregnancy, antepartum; Low vitamin D level; Elevated hemoglobin A1c; Pre-existing type 2 diabetes mellitus during pregnancy in second trimester; and GBS bacteriuria on her problem list.  Patient reports no complaints.  Contractions: Not present. Vag. Bleeding: None.  Movement: Present. Denies leaking of fluid.   The following portions of the patient's history were reviewed and updated as appropriate: allergies, current medications, past family history, past medical history, past social history, past surgical history and problem list. Problem list updated.  Objective:   Vitals:   11/12/16 0814  BP: 126/86  Pulse: 96  Weight: 186 lb 6.4 oz (84.6 kg)    Fetal Status: Fetal Heart Rate (bpm): 153; doppler Fundal Height: 22 cm Movement: Present     General:  Alert, oriented and cooperative. Patient is in no acute distress.  Skin: Skin is warm and dry. No rash noted.   Cardiovascular: Normal heart rate noted  Respiratory: Normal respiratory effort, no problems with respiration noted  Abdomen: Soft, gravid, appropriate for gestational age.  Pain/Pressure: Absent     Pelvic: Cervical exam deferred        Extremities: Normal range of motion.  Edema: None  Mental Status:  Normal mood and affect. Normal behavior. Normal judgment and thought content.   CGBs  Fasting's: all elevated: 98-126; 155 & 172 X1 each  2 hr PP: 85-126, 152, 180, 133 X 1 each Assessment and Plan:  Pregnancy: G5P4004 at [redacted]w[redacted]d  1. Pre-existing type 2 diabetes mellitus during pregnancy in second trimester      Started on Glyburide today after consult with Dr. Clearance Coots.   - glyBURIDE (DIABETA) 2.5 MG tablet; Take 1 tablet (2.5 mg total) by mouth 2 (two) times daily  with a meal.  Dispense: 60 tablet; Refill: 3 - ACCU-CHEK FASTCLIX LANCETS MISC; 1 each by Does not apply route 4 (four) times daily.  Dispense: 100 each; Refill: 9 - glucose blood (ACCU-CHEK GUIDE) test strip; Use 1 test strip to check blood glucose 4 times daily  Dispense: 100 each; Refill: 9  2. Low vitamin D level    - Vitamin D, Ergocalciferol, (DRISDOL) 50000 units CAPS capsule; Take 1 capsule (50,000 Units total) by mouth every 7 (seven) days. tuesday  Dispense: 14 capsule; Refill: 5  3. Supervision of high risk pregnancy, antepartum     - aspirin 81 MG chewable tablet; Chew 1 tablet (81 mg total) by mouth daily.  Dispense: 30 tablet; Refill: 12  4. Supervision of other normal pregnancy, antepartum    - Prenat-FeFum-FePo-FA-DHA w/o A (PROVIDA DHA) 16-16-1.25-110 MG CAPS; Take 1 tablet by mouth daily.  Dispense: 30 capsule; Refill: 12  Preterm labor symptoms and general obstetric precautions including but not limited to vaginal bleeding, contractions, leaking of fluid and fetal movement were reviewed in detail with the patient. Please refer to After Visit Summary for other counseling recommendations.  Return in about 2 weeks (around 11/26/2016) for Needs to see FP MD here, Female providers only, HOB.   Roe Coombs, CNM

## 2016-11-25 ENCOUNTER — Inpatient Hospital Stay (HOSPITAL_COMMUNITY)
Admission: AD | Admit: 2016-11-25 | Discharge: 2016-11-25 | Discharge status: Against Medical Advice | Payer: Medicaid Other | Source: Ambulatory Visit | Attending: Obstetrics and Gynecology | Admitting: Obstetrics and Gynecology

## 2016-11-25 ENCOUNTER — Encounter (HOSPITAL_COMMUNITY): Payer: Self-pay

## 2016-11-25 DIAGNOSIS — O26892 Other specified pregnancy related conditions, second trimester: Secondary | ICD-10-CM | POA: Diagnosis not present

## 2016-11-25 DIAGNOSIS — Z3A24 24 weeks gestation of pregnancy: Secondary | ICD-10-CM | POA: Insufficient documentation

## 2016-11-25 DIAGNOSIS — Z5321 Procedure and treatment not carried out due to patient leaving prior to being seen by health care provider: Secondary | ICD-10-CM | POA: Diagnosis not present

## 2016-11-25 DIAGNOSIS — R8271 Bacteriuria: Secondary | ICD-10-CM

## 2016-11-25 DIAGNOSIS — W19XXXA Unspecified fall, initial encounter: Secondary | ICD-10-CM | POA: Insufficient documentation

## 2016-11-25 HISTORY — DX: Gestational diabetes mellitus in pregnancy, unspecified control: O24.419

## 2016-11-25 LAB — URINALYSIS, ROUTINE W REFLEX MICROSCOPIC
Bilirubin Urine: NEGATIVE
HGB URINE DIPSTICK: NEGATIVE
KETONES UR: 5 mg/dL — AB
Leukocytes, UA: NEGATIVE
NITRITE: NEGATIVE
PROTEIN: 30 mg/dL — AB
Specific Gravity, Urine: 1.032 — ABNORMAL HIGH (ref 1.005–1.030)
pH: 5 (ref 5.0–8.0)

## 2016-11-25 NOTE — MAU Note (Signed)
Spoke with patient on phone with interpreter [phone]. Pt will come back to MAU within 30 minutes to 1 hour so we can put her on the monitor to check on her and baby.

## 2016-11-25 NOTE — MAU Note (Signed)
Pt not in lobby x1 

## 2016-11-25 NOTE — MAU Note (Signed)
Pt not in lobby x2. Visitor in lobby stated that she left.

## 2016-11-25 NOTE — MAU Note (Signed)
Pt said she fell and landed on her knee. No bleeding. She asked if she would see a doctor and be able to leave by 3. I told her she would have to be monitored for 4 hours after a fall. She said she couldn't stay and was going to get her kids and would be back after she got them from school. Pt left AMA

## 2016-11-26 ENCOUNTER — Ambulatory Visit (INDEPENDENT_AMBULATORY_CARE_PROVIDER_SITE_OTHER): Payer: Medicaid Other | Admitting: Obstetrics and Gynecology

## 2016-11-26 VITALS — BP 110/76 | HR 108 | Wt 187.0 lb

## 2016-11-26 DIAGNOSIS — R8271 Bacteriuria: Secondary | ICD-10-CM

## 2016-11-26 DIAGNOSIS — O24112 Pre-existing diabetes mellitus, type 2, in pregnancy, second trimester: Secondary | ICD-10-CM

## 2016-11-26 DIAGNOSIS — O0990 Supervision of high risk pregnancy, unspecified, unspecified trimester: Secondary | ICD-10-CM

## 2016-11-26 DIAGNOSIS — O0992 Supervision of high risk pregnancy, unspecified, second trimester: Secondary | ICD-10-CM

## 2016-11-26 LAB — OB RESULTS CONSOLE GBS: STREP GROUP B AG: POSITIVE

## 2016-11-26 NOTE — Progress Notes (Signed)
Patient complains of losing weight with her Glyburide, she looked on the Internet and confirmed it, so she stopped taking, yesterday AM was the last time.  She wants a different RX.  Also have pain in both legs.

## 2016-11-26 NOTE — Progress Notes (Signed)
   PRENATAL VISIT NOTE  Subjective:  Elizabeth Warren is a 36 y.o. [id number] at 6149w5d being seen today for ongoing prenatal care.  She is currently monitored for the following issues for this high-risk pregnancy and has Obesity (BMI 30-39.9); Supervision of high risk pregnancy, antepartum; Low vitamin D level; Elevated hemoglobin A1c; Pre-existing type 2 diabetes mellitus during pregnancy in second trimester; and GBS bacteriuria on her problem list.  Patient reports concerns regarding weight loss and glyburide. She discontinued glyburide yesterday.  Contractions: Not present. Vag. Bleeding: None.  Movement: Present. Denies leaking of fluid.   The following portions of the patient's history were reviewed and updated as appropriate: allergies, current medications, past family history, past medical history, past social history, past surgical history and problem list. Problem list updated.  Objective:   Vitals:   11/26/16 0903  BP: 110/76  Pulse: (!) 108  Weight: 187 lb (84.8 kg)    Fetal Status: Fetal Heart Rate (bpm): 159 Fundal Height: 24 cm Movement: Present     General:  Alert, oriented and cooperative. Patient is in no acute distress.  Skin: Skin is warm and dry. No rash noted.   Cardiovascular: Normal heart rate noted  Respiratory: Normal respiratory effort, no problems with respiration noted  Abdomen: Soft, gravid, appropriate for gestational age.  Pain/Pressure: Absent     Pelvic: Cervical exam deferred        Extremities: Normal range of motion.  Edema: None  Mental Status:  Normal mood and affect. Normal behavior. Normal judgment and thought content.   Assessment and Plan:  Pregnancy: G5P4004 at 4649w5d  1. Supervision of high risk pregnancy, antepartum Patient is doing well without complaints Reviewed anatomy ultrasound results Follow up growth scheduled on 10/26  2. Pre-existing type 2 diabetes mellitus during pregnancy in second trimester CBGs reviewed and fasting range  from 88-110. After lunch values are 111-166 (most out of range) and post dinner are within range - Reviewed the importance of euglycemia during pregnancy and the need to control diet and take medication to achieve this goal. Explained to the patient that when healthy habits are undertaken, weight loss is a normal side effect which only has a positive impact on the fetus and overall pregnancy. Patient verbalized understanding and plans to continue glyburide at the current dose. - reviewed diet with the patient as she has high values after lunch and normal values after dinner. Advised to adhere to diet at all times and to increase exercise activity with a minimum of a 30 minute walk daily. Patient also advised to consume a protein rich snack at bedtime - fetal echo ordered Patient to return in 2 weeks for CBG check - US Fetal Echocardiography; Future  3. GBS bacteriuria Will receive prophylaxis in labor  Preterm labor symptoms and general obstetric precautions including but not limited to vaginal bleeding, contractions, leaking of fluid and fetal movement were reviewed in detail with the patient. Please refer to After Visit Summary for other counseling recommendations.  Return in about 2 weeks (around 12/10/2016) for [facility].   Catalina AntiguaPeggy Claron Rosencrans, MD

## 2016-11-29 ENCOUNTER — Ambulatory Visit (HOSPITAL_COMMUNITY)
Admission: RE | Admit: 2016-11-29 | Discharge: 2016-11-29 | Disposition: A | Discharge status: Home / Self Care | Payer: Medicaid Other | Source: Ambulatory Visit | Attending: Certified Nurse Midwife | Admitting: Certified Nurse Midwife

## 2016-11-29 ENCOUNTER — Encounter (HOSPITAL_COMMUNITY): Payer: Self-pay

## 2016-11-29 DIAGNOSIS — Z362 Encounter for other antenatal screening follow-up: Secondary | ICD-10-CM | POA: Insufficient documentation

## 2016-11-29 DIAGNOSIS — O24112 Pre-existing diabetes mellitus, type 2, in pregnancy, second trimester: Secondary | ICD-10-CM

## 2016-11-29 DIAGNOSIS — O0990 Supervision of high risk pregnancy, unspecified, unspecified trimester: Secondary | ICD-10-CM

## 2016-11-29 DIAGNOSIS — Z3A24 24 weeks gestation of pregnancy: Secondary | ICD-10-CM | POA: Diagnosis not present

## 2016-11-29 DIAGNOSIS — O0992 Supervision of high risk pregnancy, unspecified, second trimester: Secondary | ICD-10-CM | POA: Insufficient documentation

## 2016-12-03 ENCOUNTER — Other Ambulatory Visit: Payer: Self-pay | Admitting: Certified Nurse Midwife

## 2016-12-03 DIAGNOSIS — O0990 Supervision of high risk pregnancy, unspecified, unspecified trimester: Secondary | ICD-10-CM

## 2016-12-03 DIAGNOSIS — O24112 Pre-existing diabetes mellitus, type 2, in pregnancy, second trimester: Secondary | ICD-10-CM

## 2016-12-12 ENCOUNTER — Ambulatory Visit (INDEPENDENT_AMBULATORY_CARE_PROVIDER_SITE_OTHER): Payer: Medicaid Other | Admitting: Obstetrics and Gynecology

## 2016-12-12 ENCOUNTER — Other Ambulatory Visit: Payer: Self-pay | Admitting: Family Medicine

## 2016-12-12 ENCOUNTER — Encounter: Payer: Self-pay | Admitting: Obstetrics and Gynecology

## 2016-12-12 VITALS — BP 118/76 | HR 103

## 2016-12-12 DIAGNOSIS — R8271 Bacteriuria: Secondary | ICD-10-CM

## 2016-12-12 DIAGNOSIS — O0990 Supervision of high risk pregnancy, unspecified, unspecified trimester: Secondary | ICD-10-CM

## 2016-12-12 DIAGNOSIS — O24112 Pre-existing diabetes mellitus, type 2, in pregnancy, second trimester: Secondary | ICD-10-CM

## 2016-12-12 MED ORDER — GLUCOSE BLOOD VI STRP: ORAL_STRIP | 9 refills | Status: DC

## 2016-12-12 MED ORDER — GLYBURIDE 2.5 MG PO TABS: ORAL_TABLET | ORAL | 4 refills | Status: DC

## 2016-12-12 NOTE — Addendum Note (Signed)
Addended by: Catalina AntiguaONSTANT, Stori Royse on: 12/12/2016 09:07 AM   Modules accepted: Orders

## 2016-12-12 NOTE — Progress Notes (Signed)
   PRENATAL VISIT NOTE  Subjective:  Elizabeth Warren is a 36 y.o. [id number] at 6625w0d being seen today for ongoing prenatal care.  She is currently monitored for the following issues for this high-risk pregnancy and has Obesity (BMI 30-39.9); Supervision of high risk pregnancy, antepartum; Low vitamin D level; Elevated hemoglobin A1c; Pre-existing type 2 diabetes mellitus during pregnancy in second trimester; and GBS bacteriuria on their problem list.  Patient reports no complaints.  Contractions: Not present. Vag. Bleeding: None.  Movement: Present. Denies leaking of fluid.   The following portions of the patient's history were reviewed and updated as appropriate: allergies, current medications, past family history, past medical history, past social history, past surgical history and problem list. Problem list updated.  Objective:   Vitals:   12/12/16 0809  BP: 118/76  Pulse: (!) 103    Fetal Status: Fetal Heart Rate (bpm): 150 Fundal Height: 27 cm Movement: Present     General:  Alert, oriented and cooperative. Patient is in no acute distress.  Skin: Skin is warm and dry. No rash noted.   Cardiovascular: Normal heart rate noted  Respiratory: Normal respiratory effort, no problems with respiration noted  Abdomen: Soft, gravid, appropriate for gestational age.  Pain/Pressure: Absent     Pelvic: Cervical exam deferred        Extremities: Normal range of motion.  Edema: None  Mental Status:  Normal mood and affect. Normal behavior. Normal judgment and thought content.   Assessment and Plan:  Pregnancy: G5P4004 at 4025w0d  1. Pre-existing type 2 diabetes mellitus during pregnancy in second trimester Patient did not bring CBG log but reports fasting mainly in the 80's with 2 values of 105 and 106. PP values range from 130-180 Will increase glyburide 5 mg in am and 2.5 in pm Follow up growth ultrasound ordered - US MFM OB FOLLOW UP; Future  2. GBS bacteriuria Will provide  prophylaxis  3. Supervision of high risk pregnancy, antepartum Patient is doing well Reviewed October growth ultrasound with resolution of pyelectasis - CBC - HIV antibody - RPR  Preterm labor symptoms and general obstetric precautions including but not limited to vaginal bleeding, contractions, leaking of fluid and fetal movement were reviewed in detail with the patient. Please refer to After Visit Summary for other counseling recommendations.  Return in about 2 weeks (around 12/26/2016).   Catalina AntiguaPeggy Talecia Sherlin, MD

## 2016-12-13 LAB — CBC
HEMATOCRIT: 35.6 % (ref 34.0–46.6)
Hemoglobin: 11.7 g/dL (ref 11.1–15.9)
MCH: 26.9 pg (ref 26.6–33.0)
MCHC: 32.9 g/dL (ref 31.5–35.7)
MCV: 82 fL (ref 79–97)
PLATELETS: 373 10*3/uL (ref 150–379)
RBC: 4.35 x10E6/uL (ref 3.77–5.28)
RDW: 15.6 % — AB (ref 12.3–15.4)
WBC: 11.9 10*3/uL — ABNORMAL HIGH (ref 3.4–10.8)

## 2016-12-13 LAB — HIV ANTIBODY (ROUTINE TESTING W REFLEX): HIV Screen 4th Generation wRfx: NONREACTIVE

## 2016-12-13 LAB — SYPHILIS: RPR W/REFLEX TO RPR TITER AND TREPONEMAL ANTIBODIES, TRADITIONAL SCREENING AND DIAGNOSIS ALGORITHM: RPR Ser Ql: NONREACTIVE

## 2016-12-27 ENCOUNTER — Ambulatory Visit (HOSPITAL_COMMUNITY)
Admission: RE | Admit: 2016-12-27 | Discharge: 2016-12-27 | Disposition: A | Discharge status: Home / Self Care | Payer: Medicaid Other | Source: Ambulatory Visit | Attending: Obstetrics and Gynecology | Admitting: Obstetrics and Gynecology

## 2016-12-27 ENCOUNTER — Other Ambulatory Visit: Payer: Self-pay | Admitting: Obstetrics and Gynecology

## 2016-12-27 DIAGNOSIS — E119 Type 2 diabetes mellitus without complications: Secondary | ICD-10-CM | POA: Insufficient documentation

## 2016-12-27 DIAGNOSIS — Z3A28 28 weeks gestation of pregnancy: Secondary | ICD-10-CM

## 2016-12-27 DIAGNOSIS — O24113 Pre-existing diabetes mellitus, type 2, in pregnancy, third trimester: Secondary | ICD-10-CM

## 2016-12-27 DIAGNOSIS — O24112 Pre-existing diabetes mellitus, type 2, in pregnancy, second trimester: Secondary | ICD-10-CM

## 2016-12-30 ENCOUNTER — Ambulatory Visit (INDEPENDENT_AMBULATORY_CARE_PROVIDER_SITE_OTHER): Payer: Medicaid Other | Admitting: Obstetrics and Gynecology

## 2016-12-30 VITALS — BP 116/80 | HR 108 | Wt 188.0 lb

## 2016-12-30 DIAGNOSIS — O0990 Supervision of high risk pregnancy, unspecified, unspecified trimester: Secondary | ICD-10-CM

## 2016-12-30 DIAGNOSIS — O24112 Pre-existing diabetes mellitus, type 2, in pregnancy, second trimester: Secondary | ICD-10-CM

## 2016-12-30 NOTE — Progress Notes (Signed)
Subjective:  Elizabeth OsmondZuhal B Deshazer is a 36 y.o. [id number] at 6862w4d being seen today for ongoing prenatal care.  She is currently monitored for the following issues for this high-risk pregnancy and has Obesity (BMI 30-39.9); Supervision of high risk pregnancy, antepartum; Low vitamin D level; Elevated hemoglobin A1c; Pre-existing type 2 diabetes mellitus during pregnancy in second trimester; and GBS bacteriuria on their problem list.  Patient reports no complaints.  Contractions: Not present. Vag. Bleeding: None.  Movement: Present. Denies leaking of fluid.   The following portions of the patient's history were reviewed and updated as appropriate: allergies, current medications, past family history, past medical history, past social history, past surgical history and problem list. Problem list updated.  Objective:   Vitals:   12/30/16 0853  BP: 116/80  Pulse: (!) 108  Weight: 188 lb (85.3 kg)    Fetal Status:     Movement: Present     General:  Alert, oriented and cooperative. Patient is in no acute distress.  Skin: Skin is warm and dry. No rash noted.   Cardiovascular: Normal heart rate noted  Respiratory: Normal respiratory effort, no problems with respiration noted  Abdomen: Soft, gravid, appropriate for gestational age. Pain/Pressure: Absent     Pelvic:  Cervical exam deferred        Extremities: Normal range of motion.  Edema: None  Mental Status: Normal mood and affect. Normal behavior. Normal judgment and thought content.   Urinalysis:      Assessment and Plan:  Pregnancy: G5P4004 at 5562w4d  1. Supervision of high risk pregnancy, antepartum Stable Declined Tdap vaccine  2. Pre-existing type 2 diabetes mellitus during pregnancy in second trimester Forgot BS readings but reports in goal range Ask to switch from oral medication to insulin d/t wt. Explained to pt,has only had a 5 # wt loss over entire pregnancy. This is most likely d/t diet and DM control with medication. Switching to  insulin is not indicated. Plus infants growth as per U/S is good Pt given more BS reading logs. Importance of bringing readings to appts stressed Fetal ECHO schedule 01/02/17. Information/directions provided and indications for ECHO reviewed with pt. - US MFM OB FOLLOW UP; Future  Preterm labor symptoms and general obstetric precautions including but not limited to vaginal bleeding, contractions, leaking of fluid and fetal movement were reviewed in detail with the patient. Please refer to After Visit Summary for other counseling recommendations.  Return in about 2 weeks (around 01/13/2017) for OB visit.   Hermina StaggersErvin, Michael L, MD

## 2016-12-30 NOTE — Progress Notes (Signed)
Tdap offered but declined. Pt wishes to change from Glyburide to insulin d/t decrease appetite and wt loss.

## 2017-01-07 ENCOUNTER — Encounter: Payer: Self-pay | Admitting: *Deleted

## 2017-01-13 ENCOUNTER — Encounter: Payer: Medicaid Other | Admitting: Obstetrics and Gynecology

## 2017-01-20 ENCOUNTER — Ambulatory Visit (INDEPENDENT_AMBULATORY_CARE_PROVIDER_SITE_OTHER): Payer: Medicaid Other | Admitting: Certified Nurse Midwife

## 2017-01-20 ENCOUNTER — Encounter: Payer: Self-pay | Admitting: Certified Nurse Midwife

## 2017-01-20 ENCOUNTER — Other Ambulatory Visit: Payer: Self-pay

## 2017-01-20 ENCOUNTER — Other Ambulatory Visit: Payer: Self-pay | Admitting: *Deleted

## 2017-01-20 VITALS — BP 131/84 | HR 109 | Wt 188.0 lb

## 2017-01-20 DIAGNOSIS — O0992 Supervision of high risk pregnancy, unspecified, second trimester: Secondary | ICD-10-CM

## 2017-01-20 DIAGNOSIS — O24112 Pre-existing diabetes mellitus, type 2, in pregnancy, second trimester: Secondary | ICD-10-CM

## 2017-01-20 DIAGNOSIS — R8271 Bacteriuria: Secondary | ICD-10-CM

## 2017-01-20 DIAGNOSIS — R7989 Other specified abnormal findings of blood chemistry: Secondary | ICD-10-CM

## 2017-01-20 DIAGNOSIS — E669 Obesity, unspecified: Secondary | ICD-10-CM

## 2017-01-20 DIAGNOSIS — M79606 Pain in leg, unspecified: Secondary | ICD-10-CM

## 2017-01-20 DIAGNOSIS — O0990 Supervision of high risk pregnancy, unspecified, unspecified trimester: Secondary | ICD-10-CM

## 2017-01-20 DIAGNOSIS — O26893 Other specified pregnancy related conditions, third trimester: Secondary | ICD-10-CM

## 2017-01-20 MED ORDER — INSULIN REGULAR HUMAN 100 UNIT/ML IJ SOLN: INTRAMUSCULAR | 5 refills | Status: DC

## 2017-01-20 MED ORDER — "INSULIN SYRINGE 27G X 1/2"" 1 ML MISC": 1.0000 | Freq: Four times a day (QID) | 5 refills | Status: DC

## 2017-01-20 MED ORDER — INSULIN NPH (HUMAN) (ISOPHANE) 100 UNIT/ML ~~LOC~~ SUSP: SUBCUTANEOUS | 5 refills | Status: DC

## 2017-01-20 MED ORDER — INSULIN NPH (HUMAN) (ISOPHANE) 100 UNIT/ML ~~LOC~~ SUSP: 38.0000 [IU] | Freq: Every day | SUBCUTANEOUS | 3 refills | Status: DC

## 2017-01-20 MED ORDER — CYCLOBENZAPRINE HCL 10 MG PO TABS: 10.0000 mg | ORAL_TABLET | Freq: Three times a day (TID) | ORAL | 1 refills | Status: DC | PRN

## 2017-01-20 MED ORDER — INSULIN ISOPHANE & REGULAR (HUMAN 70-30)100 UNIT/ML KWIKPEN: 19.0000 [IU] | PEN_INJECTOR | Freq: Every morning | SUBCUTANEOUS | 3 refills | Status: DC

## 2017-01-20 NOTE — Progress Notes (Signed)
Changes made to pt Insulin Rx's for ins coverage. Reviewed changes with provider, new orders sent. Pt aware.

## 2017-01-20 NOTE — Progress Notes (Signed)
PRENATAL VISIT NOTE  Subjective:  Elizabeth Warren is a 36 y.o. U9W1191G5P4004 at 1046w4d being seen today for ongoing prenatal care.  She is currently monitored for the following issues for this high-risk pregnancy and has Obesity (BMI 30-39.9); Supervision of high risk pregnancy, antepartum; Low vitamin D level; Elevated hemoglobin A1c; Pre-existing type 2 diabetes mellitus during pregnancy in second trimester; and GBS bacteriuria on their problem list.  Patient reports no bleeding, no contractions, no cramping, no leaking and reports insomnia and leg pains with activity, has tried OTC tylenol..  Contractions: Not present. Vag. Bleeding: None.  Movement: Present. Denies leaking of fluid.   The following portions of the patient's history were reviewed and updated as appropriate: allergies, current medications, past family history, past medical history, past social history, past surgical history and problem list. Problem list updated.  Objective:   Vitals:   01/20/17 0919  BP: 131/84  Pulse: (!) 109  Weight: 188 lb (85.3 kg)    Fetal Status: Fetal Heart Rate (bpm): 150; doppler Fundal Height: 32 cm Movement: Present     General:  Alert, oriented and cooperative. Patient is in no acute distress.  Skin: Skin is warm and dry. No rash noted.   Cardiovascular: Normal heart rate noted  Respiratory: Normal respiratory effort, no problems with respiration noted  Abdomen: Soft, gravid, appropriate for gestational age.  Pain/Pressure: Present     Pelvic: Cervical exam deferred        Extremities: Normal range of motion.  Edema: None  Mental Status:  Normal mood and affect. Normal behavior. Normal judgment and thought content.   CBG  Fasting:  Almost all elevated: >95 8/10  2 hour PP: mostly elevated >145 13/29 <125 only 3/29 readings  Assessment and Plan:  Pregnancy: G5P4004 at 3246w4d  1. Pre-existing type 2 diabetes mellitus during pregnancy in second trimester    Uncontrolled with Glyburide: 6139m  in AM and 2.5 PM.  States compliant with diet and does not eat.  Desires to try injections of insulin.  Kick counts reviewed.   - insulin NPH Human (HUMULIN N,NOVOLIN N) 100 UNIT/ML injection; Inject 0.38 mLs (38 Units total) into the skin daily before breakfast. 14 U before Bed with snack  Dispense: 1 vial; Refill: 3 - Insulin Isophane & Regular Human (HUMULIN 70/30 MIX) (70-30) 100 UNIT/ML PEN; Inject 19 Units into the skin every morning. 14 units in the PM with snack (10PM)  Dispense: 3 mL; Refill: 3 - US MFM FETAL BPP WO NON STRESS; Future     NPH: 38 AM, 14 PM    Regular insulin: 19 AM, 14 PM  Attempted to get insulin pens for her d/t english language issues.  Saw diabetic education on 10/11/16.  2. Supervision of high risk pregnancy, antepartum     POC discussed with Dr. Jolayne Pantheronstant.  Started insulin today.  Video interpreter used.  - US MFM FETAL BPP WO NON STRESS; Future  3. Obesity (BMI 30-39.9)     Has lost 5 lbs this pregnancy  4. Low vitamin D level     Taking weekly vitamin D.   5. GBS bacteriuria    PCN for labor/delivery  6. Pregnancy related leg pain, antepartum, third trimester     - cyclobenzaprine (FLEXERIL) 10 MG tablet; Take 1 tablet (10 mg total) by mouth every 8 (eight) hours as needed for muscle spasms.  Dispense: 30 tablet; Refill: 1  Preterm labor symptoms and general obstetric precautions including but not limited to vaginal  bleeding, contractions, leaking of fluid and fetal movement were reviewed in detail with the patient. Please refer to After Visit Summary for other counseling recommendations.  [facility] (around [facility]27[facility]2017[facility] Needs to see FP MD here, Female providers only, Bi-weekly Antenatal testing.starting next week with weekly AFI. Has growth US scheduled for 01/29/17.     Roe Coombsachelle A Graeden Bitner, CNM

## 2017-01-20 NOTE — Progress Notes (Signed)
Complains of being unable to sleep, having pain in both knees 8/10 x 3+ weeks. Tylenol does not work, she wants Rx to sleep and for the pain.

## 2017-01-29 ENCOUNTER — Encounter (HOSPITAL_COMMUNITY): Payer: Self-pay

## 2017-01-29 ENCOUNTER — Other Ambulatory Visit (HOSPITAL_COMMUNITY): Payer: Self-pay | Admitting: *Deleted

## 2017-01-29 ENCOUNTER — Other Ambulatory Visit: Payer: Self-pay | Admitting: Certified Nurse Midwife

## 2017-01-29 ENCOUNTER — Other Ambulatory Visit: Payer: Self-pay | Admitting: Obstetrics and Gynecology

## 2017-01-29 ENCOUNTER — Ambulatory Visit (HOSPITAL_COMMUNITY)
Admission: RE | Admit: 2017-01-29 | Discharge: 2017-01-29 | Disposition: A | Discharge status: Home / Self Care | Payer: Medicaid Other | Source: Ambulatory Visit | Attending: Obstetrics and Gynecology | Admitting: Obstetrics and Gynecology

## 2017-01-29 DIAGNOSIS — Z3A32 32 weeks gestation of pregnancy: Secondary | ICD-10-CM | POA: Diagnosis not present

## 2017-01-29 DIAGNOSIS — O0990 Supervision of high risk pregnancy, unspecified, unspecified trimester: Secondary | ICD-10-CM | POA: Diagnosis present

## 2017-01-29 DIAGNOSIS — O24112 Pre-existing diabetes mellitus, type 2, in pregnancy, second trimester: Secondary | ICD-10-CM | POA: Diagnosis present

## 2017-01-29 DIAGNOSIS — Z794 Long term (current) use of insulin: Principal | ICD-10-CM

## 2017-01-29 DIAGNOSIS — O24313 Unspecified pre-existing diabetes mellitus in pregnancy, third trimester: Secondary | ICD-10-CM

## 2017-01-29 DIAGNOSIS — O24119 Pre-existing diabetes mellitus, type 2, in pregnancy, unspecified trimester: Secondary | ICD-10-CM

## 2017-01-29 DIAGNOSIS — O24113 Pre-existing diabetes mellitus, type 2, in pregnancy, third trimester: Secondary | ICD-10-CM | POA: Diagnosis not present

## 2017-01-30 ENCOUNTER — Telehealth: Payer: Self-pay | Admitting: Pediatrics

## 2017-01-30 ENCOUNTER — Encounter (HOSPITAL_COMMUNITY): Payer: Self-pay | Admitting: *Deleted

## 2017-01-30 ENCOUNTER — Inpatient Hospital Stay (HOSPITAL_COMMUNITY)
Admission: AD | Admit: 2017-01-30 | Discharge: 2017-01-30 | Disposition: A | Discharge status: Home / Self Care | Payer: Medicaid Other | Source: Ambulatory Visit | Attending: Obstetrics & Gynecology | Admitting: Obstetrics & Gynecology

## 2017-01-30 DIAGNOSIS — O99513 Diseases of the respiratory system complicating pregnancy, third trimester: Secondary | ICD-10-CM | POA: Insufficient documentation

## 2017-01-30 DIAGNOSIS — R8271 Bacteriuria: Secondary | ICD-10-CM

## 2017-01-30 DIAGNOSIS — Z8249 Family history of ischemic heart disease and other diseases of the circulatory system: Secondary | ICD-10-CM | POA: Diagnosis not present

## 2017-01-30 DIAGNOSIS — Z3A33 33 weeks gestation of pregnancy: Secondary | ICD-10-CM | POA: Diagnosis not present

## 2017-01-30 DIAGNOSIS — O9989 Other specified diseases and conditions complicating pregnancy, childbirth and the puerperium: Secondary | ICD-10-CM | POA: Diagnosis not present

## 2017-01-30 DIAGNOSIS — R52 Pain, unspecified: Secondary | ICD-10-CM | POA: Diagnosis present

## 2017-01-30 DIAGNOSIS — J069 Acute upper respiratory infection, unspecified: Secondary | ICD-10-CM | POA: Diagnosis not present

## 2017-01-30 DIAGNOSIS — Z833 Family history of diabetes mellitus: Secondary | ICD-10-CM | POA: Insufficient documentation

## 2017-01-30 LAB — URINALYSIS, ROUTINE W REFLEX MICROSCOPIC
Bilirubin Urine: NEGATIVE
GLUCOSE, UA: NEGATIVE mg/dL
Hgb urine dipstick: NEGATIVE
Ketones, ur: NEGATIVE mg/dL
LEUKOCYTES UA: NEGATIVE
Nitrite: NEGATIVE
PROTEIN: NEGATIVE mg/dL
Specific Gravity, Urine: 1.016 (ref 1.005–1.030)
pH: 6 (ref 5.0–8.0)

## 2017-01-30 MED ORDER — PHENOL 1.4 % MT LIQD
1.0000 | OROMUCOSAL | Status: DC | PRN
  Administered 2017-01-30: 1 via OROMUCOSAL
  Filled 2017-01-30: qty 177

## 2017-01-30 MED ORDER — ACETAMINOPHEN 500 MG PO TABS
1000.0000 mg | ORAL_TABLET | Freq: Once | ORAL | Status: AC
  Administered 2017-01-30: 1000 mg via ORAL
  Filled 2017-01-30: qty 2

## 2017-01-30 NOTE — Progress Notes (Signed)
Pt left unit ambulatory prior to receiving discharge instructions.

## 2017-01-30 NOTE — MAU Note (Signed)
PT SAYS YESTERDAY HER FEVER AT HOME WAS 105.    ALL BODY ACHES.  YESTERDAY  SHE TOOK TYLENOL 500MG  Q4 HRS    THEN IBUPROFEN 200MG   YESTERDAY AM. .  AT 0200 SHE TOOK ASPIRIN.    FELT UC'S YESTERDAY.     [facility]

## 2017-01-30 NOTE — MAU Provider Note (Signed)
  History     CSN: [id number]  Arrival date and time: 01/30/17 [id number]   First Provider Initiated Contact with Patient 01/30/17 [phone]      Chief Complaint  Patient presents with  . Contractions  . Fever   HPI  HPI: Elizabeth Warren is a 36 y.o. year old 665P4004 female at 6260w0d weeks gestation who presents to MAU reporting "feeling sick with body aches, sore throat. Standing up at bedside requesting "medicine for body aches and sore throat". Denies VB or LOF. Reports good (+) FM.   Past Medical History:  Diagnosis Date  . Gestational diabetes   . Hypertension   . NVD (normal vaginal delivery) 09/20/2010  . Pre-existing type 2 diabetes mellitus during pregnancy in second trimester 10/02/2016    Past Surgical History:  Procedure Laterality Date  . NO PAST SURGERIES      Family History  Problem Relation Age of Onset  . Diabetes Mother   . Hypertension Mother   . Diabetes Father   . Hypertension Father   . Diabetes Maternal Grandmother   . Hypertension Maternal Grandmother   . Diabetes Maternal Grandfather   . Hypertension Maternal Grandfather   . Diabetes Paternal Grandmother   . Hypertension Paternal Grandmother   . Diabetes Paternal Grandfather   . Hypertension Paternal Grandfather   . Cancer Neg Hx   . Heart disease Neg Hx   . Stroke Neg Hx     Social History   Tobacco Use  . Smoking status: Never Smoker  . Smokeless tobacco: Never Used  Substance Use Topics  . Alcohol use: No  . Drug use: No    Allergies: No Known Allergies  No medications prior to admission.    Review of Systems Physical Exam   Blood pressure 111/76, pulse (!) 127, temperature 98.8 F (37.1 C), temperature source Oral, resp. rate 20, height 5\' 5"  (1.651 m), weight 190 lb (86.2 kg), last menstrual period 12/21/2015, SpO2 100 %.  Physical Exam  MAU Course  Procedures  MDM CCUA NST - FHR: 155 bpm / moderate variability / accels present / decels absent / TOCO: none Tylenol 1000 mg  -- improved headache and body aches Chloraseptic Throat Spray -- improved sore throat  Results for orders placed or performed during the hospital encounter of 01/30/17 (from the past 72 hour(s))  Urinalysis, Routine w reflex microscopic     Status: Abnormal   Collection Time: 01/30/17  6:43 AM  Result Value Ref Range   Color, Urine YELLOW YELLOW   APPearance CLOUDY (A) CLEAR   Specific Gravity, Urine 1.016 1.005 - 1.030   pH 6.0 5.0 - 8.0   Glucose, UA NEGATIVE NEGATIVE mg/dL   Hgb urine dipstick NEGATIVE NEGATIVE   Bilirubin Urine NEGATIVE NEGATIVE   Ketones, ur NEGATIVE NEGATIVE mg/dL   Protein, ur NEGATIVE NEGATIVE mg/dL   Nitrite NEGATIVE NEGATIVE   Leukocytes, UA NEGATIVE NEGATIVE     Assessment and Plan  Upper respiratory tract infection, unspecified type - Patient left before provider could complete d/c instructions  - Discharge home - [facility]    Raelyn Moraolitta Jourdan Maldonado, MSN, CNM 01/31/2017, 9:50 AM

## 2017-01-30 NOTE — Progress Notes (Signed)
Pt reports she's feeling better, requesting to leave.  Pt informed RN will inform CNM and RN will return with discharge instructions.

## 2017-01-30 NOTE — Telephone Encounter (Signed)
Patient called office c/o fever of 104 since yesterday am, severe body aches and pain, chills, and feeling nauseous. She states Tylenol does not help fever or pain.  I advised with fever of 104 she should go to MAU for eval. She voiced understanding and agreed with plan.

## 2017-01-31 ENCOUNTER — Other Ambulatory Visit: Payer: Self-pay

## 2017-01-31 ENCOUNTER — Inpatient Hospital Stay (HOSPITAL_COMMUNITY)
Admission: AD | Admit: 2017-01-31 | Discharge: 2017-01-31 | Disposition: A | Discharge status: Home / Self Care | Payer: Medicaid Other | Source: Ambulatory Visit | Attending: Obstetrics & Gynecology | Admitting: Obstetrics & Gynecology

## 2017-01-31 ENCOUNTER — Encounter (HOSPITAL_COMMUNITY): Payer: Self-pay | Admitting: *Deleted

## 2017-01-31 DIAGNOSIS — Z7982 Long term (current) use of aspirin: Secondary | ICD-10-CM | POA: Insufficient documentation

## 2017-01-31 DIAGNOSIS — M79606 Pain in leg, unspecified: Secondary | ICD-10-CM

## 2017-01-31 DIAGNOSIS — I10 Essential (primary) hypertension: Secondary | ICD-10-CM | POA: Insufficient documentation

## 2017-01-31 DIAGNOSIS — O163 Unspecified maternal hypertension, third trimester: Secondary | ICD-10-CM | POA: Diagnosis not present

## 2017-01-31 DIAGNOSIS — R509 Fever, unspecified: Secondary | ICD-10-CM | POA: Insufficient documentation

## 2017-01-31 DIAGNOSIS — Z3A33 33 weeks gestation of pregnancy: Secondary | ICD-10-CM | POA: Insufficient documentation

## 2017-01-31 DIAGNOSIS — O26893 Other specified pregnancy related conditions, third trimester: Secondary | ICD-10-CM

## 2017-01-31 DIAGNOSIS — Z794 Long term (current) use of insulin: Secondary | ICD-10-CM | POA: Insufficient documentation

## 2017-01-31 DIAGNOSIS — O2343 Unspecified infection of urinary tract in pregnancy, third trimester: Secondary | ICD-10-CM | POA: Diagnosis not present

## 2017-01-31 DIAGNOSIS — O24113 Pre-existing diabetes mellitus, type 2, in pregnancy, third trimester: Secondary | ICD-10-CM | POA: Insufficient documentation

## 2017-01-31 DIAGNOSIS — B9689 Other specified bacterial agents as the cause of diseases classified elsewhere: Secondary | ICD-10-CM | POA: Diagnosis not present

## 2017-01-31 DIAGNOSIS — O99513 Diseases of the respiratory system complicating pregnancy, third trimester: Secondary | ICD-10-CM | POA: Diagnosis present

## 2017-01-31 DIAGNOSIS — R8271 Bacteriuria: Secondary | ICD-10-CM | POA: Diagnosis not present

## 2017-01-31 DIAGNOSIS — J069 Acute upper respiratory infection, unspecified: Secondary | ICD-10-CM

## 2017-01-31 LAB — URINALYSIS, ROUTINE W REFLEX MICROSCOPIC
BILIRUBIN URINE: NEGATIVE
Hgb urine dipstick: NEGATIVE
KETONES UR: 20 mg/dL — AB
Nitrite: NEGATIVE
PH: 5 (ref 5.0–8.0)
Protein, ur: 100 mg/dL — AB
Specific Gravity, Urine: 1.024 (ref 1.005–1.030)

## 2017-01-31 LAB — CBC WITH DIFFERENTIAL/PLATELET
Basophils Absolute: 0 10*3/uL (ref 0.0–0.1)
Basophils Relative: 0 %
Eosinophils Absolute: 0.1 10*3/uL (ref 0.0–0.7)
Eosinophils Relative: 1 %
HEMATOCRIT: 35.9 % — AB (ref 36.0–46.0)
Hemoglobin: 12.1 g/dL (ref 12.0–15.0)
LYMPHS ABS: 1.6 10*3/uL (ref 0.7–4.0)
LYMPHS PCT: 12 %
MCH: 26.2 pg (ref 26.0–34.0)
MCHC: 33.7 g/dL (ref 30.0–36.0)
MCV: 77.7 fL — AB (ref 78.0–100.0)
MONO ABS: 0.6 10*3/uL (ref 0.1–1.0)
MONOS PCT: 5 %
NEUTROS ABS: 11.1 10*3/uL — AB (ref 1.7–7.7)
Neutrophils Relative %: 82 %
Platelets: 325 10*3/uL (ref 150–400)
RBC: 4.62 MIL/uL (ref 3.87–5.11)
RDW: 15.5 % (ref 11.5–15.5)
WBC: 13.3 10*3/uL — ABNORMAL HIGH (ref 4.0–10.5)

## 2017-01-31 LAB — RAPID STREP SCREEN (MED CTR MEBANE ONLY): Streptococcus, Group A Screen (Direct): NEGATIVE

## 2017-01-31 LAB — INFLUENZA PANEL BY PCR (TYPE A & B)
Influenza A By PCR: NEGATIVE
Influenza B By PCR: NEGATIVE

## 2017-01-31 MED ORDER — CEPHALEXIN 500 MG PO CAPS: 500.0000 mg | ORAL_CAPSULE | Freq: Four times a day (QID) | ORAL | 0 refills | Status: DC

## 2017-01-31 MED ORDER — PHENAZOPYRIDINE HCL 100 MG PO TABS
200.0000 mg | ORAL_TABLET | Freq: Once | ORAL | Status: AC
  Administered 2017-01-31: 200 mg via ORAL
  Filled 2017-01-31: qty 2

## 2017-01-31 MED ORDER — CYCLOBENZAPRINE HCL 10 MG PO TABS
10.0000 mg | ORAL_TABLET | Freq: Once | ORAL | Status: AC
Start: 2017-01-31 — End: 2017-01-31
  Administered 2017-01-31: 10 mg via ORAL
  Filled 2017-01-31: qty 1

## 2017-01-31 MED ORDER — ACETAMINOPHEN 500 MG PO TABS
1000.0000 mg | ORAL_TABLET | Freq: Four times a day (QID) | ORAL | Status: DC | PRN
  Administered 2017-01-31: 1000 mg via ORAL
  Filled 2017-01-31: qty 2

## 2017-01-31 MED ORDER — PHENAZOPYRIDINE HCL 200 MG PO TABS: 200.0000 mg | ORAL_TABLET | Freq: Three times a day (TID) | ORAL | 0 refills | Status: AC | PRN

## 2017-01-31 NOTE — Discharge Instructions (Signed)

## 2017-01-31 NOTE — MAU Provider Note (Signed)
Patient Elizabeth Warren is a 36 y.o. U9W1191G5P4004 At 5724w1d here for the second time in two days. She has the same complaint as yesterday. She complains of diffuse body pain, abdominal pain, back pain. She says that she has had a very high fever at home between 99 and 101. She was discharged yesterday with diagnosis of viral URI with recommendation to take tylenol and rest.   Patient is a Type 2 Diabetic on Insulin.   She said that she has had these suprapubic complaints before and that she had a UTI.  History     CSN: [id number]  Arrival date and time: 01/31/17 1050   None     Chief Complaint  Patient presents with  . Fever   URI   This is a new problem. The current episode started in the past 7 days. The problem has been unchanged. There has been no fever. Associated symptoms include abdominal pain, ear pain and a sore throat. Pertinent negatives include no coughing, diarrhea, dysuria or nausea.   The patient also complains of suprapubic pressure that feels like cramping, as well as "only a little" urine coming out. She  complains that her suprapubic pain is radiating into her back.   OB History    Gravida Para Term Preterm AB Living   5 4 4  0 0 4   SAB TAB Ectopic Multiple Live Births   0 0 0 0 4      Past Medical History:  Diagnosis Date  . Gestational diabetes   . Hypertension   . NVD (normal vaginal delivery) 09/20/2010  . Pre-existing type 2 diabetes mellitus during pregnancy in second trimester 10/02/2016    Past Surgical History:  Procedure Laterality Date  . NO PAST SURGERIES      Family History  Problem Relation Age of Onset  . Diabetes Mother   . Hypertension Mother   . Diabetes Father   . Hypertension Father   . Diabetes Maternal Grandmother   . Hypertension Maternal Grandmother   . Diabetes Maternal Grandfather   . Hypertension Maternal Grandfather   . Diabetes Paternal Grandmother   . Hypertension Paternal Grandmother   . Diabetes Paternal Grandfather    . Hypertension Paternal Grandfather   . Cancer Neg Hx   . Heart disease Neg Hx   . Stroke Neg Hx     Social History   Tobacco Use  . Smoking status: Never Smoker  . Smokeless tobacco: Never Used  Substance Use Topics  . Alcohol use: No  . Drug use: No    Allergies: No Known Allergies  Medications Prior to Admission  Medication Sig Dispense Refill Last Dose  . ACCU-CHEK FASTCLIX LANCETS MISC 1 each by Does not apply route 4 (four) times daily. 100 each 9 Taking  . acetaminophen (TYLENOL) 500 MG tablet Take 500 mg by mouth every 6 (six) hours as needed for moderate pain.   01/29/2017 at Unknown time  . aspirin 81 MG chewable tablet Chew 1 tablet (81 mg total) by mouth daily. 30 tablet 12 01/30/2017 at Unknown time  . calcium carbonate (TUMS - DOSED IN MG ELEMENTAL CALCIUM) 500 MG chewable tablet Chew 1 tablet by mouth 2 (two) times daily as needed for indigestion or heartburn.   01/29/2017 at Unknown time  . cyclobenzaprine (FLEXERIL) 10 MG tablet Take 1 tablet (10 mg total) by mouth every 8 (eight) hours as needed for muscle spasms. 30 tablet 1 01/30/2017 at Unknown time  . glucose blood (ACCU-CHEK GUIDE)  test strip Use 1 test strip to check blood glucose 4 times daily 100 each 9 Taking  . glyBURIDE (DIABETA) 2.5 MG tablet Take 1 tablet (2.5 mg total) by mouth 2 (two) times daily with a meal. (Patient not taking: Reported on 01/29/2017) 60 tablet 3 Not Taking  . glyBURIDE (DIABETA) 2.5 MG tablet Take 2 tablets with breakfast and 1 tablet at bedtime (Patient not taking: Reported on 01/29/2017) 90 tablet 4 Not Taking  . insulin NPH Human (HUMULIN N,NOVOLIN N) 100 UNIT/ML injection Inject 38 units before breakfast. Inject 14 units at bedtime with snack. 10 mL 5 01/29/2017 at 1800  . insulin regular (NOVOLIN R,HUMULIN R) 100 units/mL injection Inject 19 units before breakfast. Inject 14 units at bedtime with snack. 10 mL 5 01/29/2017 at 1800  . Insulin Syringe 27G X 1/2" 1 ML MISC 1 each  by Does not apply route 4 (four) times daily. 100 each 5 Taking  . Prenat-FeFum-FePo-FA-DHA w/o A (PROVIDA DHA) 16-16-1.25-110 MG CAPS Take 1 tablet by mouth daily. 30 capsule 12 01/29/2017 at Unknown time  . Vitamin D, Ergocalciferol, (DRISDOL) 50000 units CAPS capsule Take 1 capsule (50,000 Units total) by mouth every 7 (seven) days. tuesday 14 capsule 5 01/27/2017    Review of Systems  Constitutional: Positive for chills and fever.  HENT: Positive for ear pain and sore throat.   Respiratory: Negative for cough.   Gastrointestinal: Positive for abdominal pain. Negative for constipation, diarrhea and nausea.  Genitourinary: Negative for dysuria.   Physical Exam   Blood pressure 122/85, pulse (!) 128, temperature 98.1 F (36.7 C), temperature source Oral, resp. rate 18, height 5\' 5"  (1.651 m), weight 85.7 kg (189 lb), last menstrual period 12/21/2015.  Physical Exam  Constitutional: She is oriented to person, place, and time. She appears well-developed.  HENT:  Head: Normocephalic.  Nose: Nose normal.  Mouth/Throat: Oropharynx is clear and moist. No oropharyngeal exudate.  Unable to assess TM due to waxy building up but no tragal tenderness. No Lymphadenopathy; oropharynx is light pink, no blistering, exudate or swollen tonsils.   Eyes: Pupils are equal, round, and reactive to light. Right eye exhibits no discharge.  Neck: Normal range of motion.  Cardiovascular: Normal rate.  GI: Soft.  Negative CVA tenderness; suprapubic tenderness with deep palpation.   Genitourinary: Vagina normal.  Musculoskeletal: Normal range of motion.  Neurological: She is alert and oriented to person, place, and time.  Skin: Skin is warm and dry.  Psychiatric: She has a normal mood and affect.    MAU Course  Procedures  MDM CBC with diff--normal Urine culture pending Flu negative Strep A negative 1000 mg of tylenol, 200 of pyridum and 10 mg of flexeril. Patient says that her pain is a 2/10.     Given patient's diabetic history and the fact that she says that these symtoms are identical to her symptoms in the past when she had a UTI, will treat presumptively, although no blood or nitrites in her urine and VSS.   NST: 150 bpm mod variability, present acel, no decels, irregular contractions.   Assessment and Plan   1. Upper respiratory tract infection, unspecified type   2. GBS bacteriuria   3. Pregnancy related leg pain, antepartum, third trimester   4. Urinary tract infection affecting care of mother in third trimester, antepartum    2. Reviewed the POC with patient in detail.  Patient will start antibiotics, pyridium and  Flexeril PRN. She knows we will call her if her  Urine Culture shows that her antibiotic needs to be changed. She verbalized understanding and plans to keep her appt on 02/03/2017.   3. Return precautions reviewed; all questions answered.    Elizabeth Warren 01/31/2017, 3:07 PM

## 2017-01-31 NOTE — MAU Note (Addendum)
Pt reports she is having body aches and fever 100.3 Was here yesterday for the same thing. Taking tylenol. C/o pain in pressure in her bladder. Frequent urination.

## 2017-01-31 NOTE — MAU Note (Signed)
[facility] "pain [facility] better".

## 2017-02-01 LAB — CULTURE, OB URINE

## 2017-02-02 ENCOUNTER — Ambulatory Visit (HOSPITAL_COMMUNITY)
Admission: EM | Admit: 2017-02-02 | Discharge: 2017-02-02 | Disposition: A | Discharge status: Home / Self Care | Payer: Medicaid Other | Attending: Internal Medicine | Admitting: Internal Medicine

## 2017-02-02 ENCOUNTER — Encounter (HOSPITAL_COMMUNITY): Payer: Self-pay | Admitting: *Deleted

## 2017-02-02 ENCOUNTER — Other Ambulatory Visit: Payer: Self-pay

## 2017-02-02 DIAGNOSIS — B079 Viral wart, unspecified: Secondary | ICD-10-CM | POA: Diagnosis not present

## 2017-02-02 DIAGNOSIS — R58 Hemorrhage, not elsewhere classified: Secondary | ICD-10-CM | POA: Diagnosis not present

## 2017-02-02 NOTE — ED Triage Notes (Addendum)
Pt is [redacted] wks pregnant; currently on abx for UTI.  Reports lesion to right forehead x "years"; on occasion when she scratches area, it bleeds.  Today she scratches lesion, and describes significant bleeding.  Denies pain.

## 2017-02-02 NOTE — ED Provider Notes (Signed)
[facility]    CSN: [id number] Arrival date & time: 02/02/17  1953     History   Chief Complaint Chief Complaint  Patient presents with  . Wound Check    HPI Elizabeth Warren is a 36 y.o. female.   36 year old female that is [redacted] weeks gestation has a mole or wart-type lesion to the forehead. Its been there for over a year. Occasionally she will scratch it and it will bleed. This occurred this evening and the blood seemed to be more than she expected. This prompted her to come to the urgent care. By the time she arrived she was no longer bleeding.      Past Medical History:  Diagnosis Date  . Gestational diabetes   . Hypertension   . NVD (normal vaginal delivery) 09/20/2010  . Pre-existing type 2 diabetes mellitus during pregnancy in second trimester 10/02/2016    Patient Active Problem List   Diagnosis Date Noted  . Upper respiratory infection 01/30/2017  . GBS bacteriuria 10/13/2016  . Pre-existing type 2 diabetes mellitus during pregnancy in second trimester 10/02/2016  . Elevated hemoglobin A1c 09/06/2016  . Low vitamin D level 09/03/2016  . Supervision of high risk pregnancy, antepartum 09/02/2016  . Obesity (BMI 30-39.9) 10/31/2011    History reviewed. No pertinent surgical history.  OB History    Gravida Para Term Preterm AB Living   5 4 4  0 0 4   SAB TAB Ectopic Multiple Live Births   0 0 0 0 4       Home Medications    Prior to Admission medications   Medication Sig Start Date End Date Taking? Authorizing Provider  acetaminophen (TYLENOL) 500 MG tablet Take 500 mg by mouth every 6 (six) hours as needed for moderate pain.   Yes [provider]  aspirin 81 MG chewable tablet Chew 1 tablet (81 mg total) by mouth daily. 11/12/16  Yes Denney, Rachelle A, CNM  calcium carbonate (TUMS - DOSED IN MG ELEMENTAL CALCIUM) 500 MG chewable tablet Chew 1 tablet by mouth 2 (two) times daily as needed for indigestion or heartburn.   Yes [provider]  cephALEXin (KEFLEX) 500 MG capsule Take 1 capsule (500 mg total) by mouth 4 (four) times daily. 01/31/17  Yes Marylene Land, CNM  cyclobenzaprine (FLEXERIL) 10 MG tablet Take 1 tablet (10 mg total) by mouth every 8 (eight) hours as needed for muscle spasms. 01/20/17  Yes Denney, Rachelle A, CNM  insulin NPH Human (HUMULIN N,NOVOLIN N) 100 UNIT/ML injection Inject 38 units before breakfast. Inject 14 units at bedtime with snack. 01/20/17  Yes Denney, Rachelle A, CNM  Prenat-FeFum-FePo-FA-DHA w/o A (PROVIDA DHA) 16-16-1.25-110 MG CAPS Take 1 tablet by mouth daily. 11/12/16  Yes Denney, Rachelle A, CNM  Vitamin D, Ergocalciferol, (DRISDOL) 50000 units CAPS capsule Take 1 capsule (50,000 Units total) by mouth every 7 (seven) days. tuesday 11/12/16  Yes Denney, Rachelle A, CNM  ACCU-CHEK FASTCLIX LANCETS MISC 1 each by Does not apply route 4 (four) times daily. 11/12/16   Orvilla Cornwall A, CNM  glucose blood (ACCU-CHEK GUIDE) test strip Use 1 test strip to check blood glucose 4 times daily 12/12/16   Constant, Peggy, MD  glyBURIDE (DIABETA) 2.5 MG tablet Take 1 tablet (2.5 mg total) by mouth 2 (two) times daily with a meal. Patient not taking: Reported on 01/29/2017 11/12/16   Orvilla Cornwall A, CNM  glyBURIDE (DIABETA) 2.5 MG tablet Take 2 tablets with breakfast and 1  tablet at bedtime Patient not taking: Reported on 01/29/2017 12/12/16   Constant, Peggy, MD  insulin regular (NOVOLIN R,HUMULIN R) 100 units/mL injection Inject 19 units before breakfast. Inject 14 units at bedtime with snack. 01/20/17   Orvilla Cornwallenney, Rachelle A, CNM  Insulin Syringe 27G X 1/2" 1 ML MISC 1 each by Does not apply route 4 (four) times daily. 01/20/17   Orvilla Cornwallenney, Rachelle A, CNM  phenazopyridine (PYRIDIUM) 200 MG tablet Take 1 tablet (200 mg total) by mouth 3 (three) times daily as needed for up to 5 days for pain. 01/31/17 02/05/17  Marylene LandKooistra, Kathryn Lorraine, CNM    Family History Family History  Problem  Relation Age of Onset  . Diabetes Mother   . Hypertension Mother   . Diabetes Father   . Hypertension Father   . Diabetes Maternal Grandmother   . Hypertension Maternal Grandmother   . Diabetes Maternal Grandfather   . Hypertension Maternal Grandfather   . Diabetes Paternal Grandmother   . Hypertension Paternal Grandmother   . Diabetes Paternal Grandfather   . Hypertension Paternal Grandfather   . Cancer Neg Hx   . Heart disease Neg Hx   . Stroke Neg Hx     Social History Social History   Tobacco Use  . Smoking status: Never Smoker  . Smokeless tobacco: Never Used  Substance Use Topics  . Alcohol use: No  . Drug use: No     Allergies   Patient has no known allergies.   Review of Systems Review of Systems  HENT: Negative.   Neurological: Negative.   All other systems reviewed and are negative.    Physical Exam Triage Vital Signs ED Triage Vitals  Enc Vitals Group     BP 02/02/17 2056 (!) 125/96     Pulse Rate 02/02/17 2056 (!) 108     Resp 02/02/17 2056 20     Temp 02/02/17 2056 98.4 F (36.9 C)     Temp Source 02/02/17 2056 Oral     SpO2 02/02/17 2056 100 %     Weight --      Height --      Head Circumference --      Peak Flow --      Pain Score 02/02/17 2103 0     Pain Loc --      Pain Edu? --      Excl. in GC? --    No data found.  Updated Vital Signs BP 117/79   Pulse (!) 108 Comment: notified rn  Temp 98.4 F (36.9 C) (Oral)   Resp 20   LMP 12/21/2015   SpO2 100%   Visual Acuity Right Eye Distance:   Left Eye Distance:   Bilateral Distance:    Right Eye Near:   Left Eye Near:    Bilateral Near:     Physical Exam  Constitutional: She is oriented to person, place, and time. She appears well-developed and well-nourished. No distress.  Eyes: EOM are normal.  Neck: Normal range of motion. Neck supple.  Cardiovascular: Normal rate.  Pulmonary/Chest: Effort normal. No respiratory distress.  Musculoskeletal: She exhibits no edema.    Neurological: She is alert and oriented to person, place, and time. She exhibits normal muscle tone.  Skin: Skin is warm and dry.  The cap of the lesion on the forehead has been scratched off. Difficult to discern exactly what type of lesion this is it is more like a wart. It is not a flat mole. No bleeding.  Psychiatric: She has a normal mood and affect.  Nursing note and vitals reviewed.    UC Treatments / Results  Labs (all labs ordered are listed, but only abnormal results are displayed) Labs Reviewed - No data to display  EKG  EKG Interpretation None       Radiology No results found.  Procedures Procedures (including critical care time)  Medications Ordered in UC Medications - No data to display   Initial Impression / Assessment and Plan / UC Course  I have reviewed the triage vital signs and the nursing notes.  Pertinent labs & imaging results that were available during my care of the patient were reviewed by me and considered in my medical decision making (see chart for details).    May need to follow-up with your primary care doctor to have a look at it after it has healed up. I think this is probably a benign type of lesion such as a mole or wart. If it continues to bleed when it is scratched or as bothersome he may want to have it removed.     Final Clinical Impressions(s) / UC Diagnoses   Final diagnoses:  Wart of face  Bleeding    ED Discharge Orders    None       Controlled Substance Prescriptions [place] Controlled Substance Registry consulted? Not Applicable   Hayden RasmussenMabe, Reola Buckles, NP 02/02/17 2135

## 2017-02-02 NOTE — Discharge Instructions (Signed)
May need to follow-up with your primary care doctor to have a look at it after it has healed up. I think this is probably a benign type of lesion such as a mole or wart. If it continues to bleed when it is scratched or as bothersome he may want to have it removed.

## 2017-02-03 ENCOUNTER — Ambulatory Visit (INDEPENDENT_AMBULATORY_CARE_PROVIDER_SITE_OTHER): Payer: Medicaid Other

## 2017-02-03 VITALS — BP 121/87 | HR 112 | Wt 189.6 lb

## 2017-02-03 DIAGNOSIS — O24112 Pre-existing diabetes mellitus, type 2, in pregnancy, second trimester: Secondary | ICD-10-CM

## 2017-02-03 LAB — CULTURE, GROUP A STREP (THRC)

## 2017-02-03 NOTE — Progress Notes (Signed)
NST only dx: GDM NST-R on non OBIX monitor

## 2017-02-03 NOTE — Progress Notes (Signed)
NST reviewed and reactive with baseline 140, mod variability, + accels, no decels 

## 2017-02-04 NOTE — L&D Delivery Note (Signed)
Delivery Note At 1:16 PM a viable female was delivered via Vaginal, Spontaneous (Presentation:vertex ; LOA ).  APGAR: 9, 9; weight pending  Placenta status:delivered intact  , .  Cord: 3V with the following complications:none  .  Cord pH: N/A  Anesthesia:  Epidural  Episiotomy: None Lacerations: 1st degree;Perineal Suture Repair: 3.0 Est. Blood Loss (mL): 150  Mom to postpartum.  Baby to Couplet care / Skin to Skin.  Cathren Sween 03/07/2017, 1:53 PM

## 2017-02-06 ENCOUNTER — Encounter: Payer: Self-pay | Admitting: Obstetrics and Gynecology

## 2017-02-06 ENCOUNTER — Encounter: Payer: Medicaid Other | Admitting: Obstetrics and Gynecology

## 2017-02-06 ENCOUNTER — Ambulatory Visit: Payer: Medicaid Other | Admitting: Obstetrics and Gynecology

## 2017-02-06 ENCOUNTER — Other Ambulatory Visit: Payer: Medicaid Other

## 2017-02-06 NOTE — Progress Notes (Signed)
Patient arrived 20 minutes late for her OB/ AFI appointment today.  At 3:40pm patient stated she could not wait to be seen due to the fact she had to pick up her kids.  We were able to [facility].  Patient declined this appointment for similar reason, that it would interfere with picking up her kids.  She states she needs AM appointments. Patient left without being seen.  Patient is scheduled to return to our office on 02/10/17 for her OBF/NST/AFI.

## 2017-02-06 NOTE — Progress Notes (Signed)
Patient left before being seen by provider.  Elizabeth LenisK. Elizabeth Warren, M.D. Attending Obstetrician & Gynecologist, [facility], [facility]

## 2017-02-10 ENCOUNTER — Other Ambulatory Visit: Payer: Medicaid Other

## 2017-02-10 ENCOUNTER — Encounter: Payer: Self-pay | Admitting: Obstetrics and Gynecology

## 2017-02-10 ENCOUNTER — Other Ambulatory Visit: Payer: Self-pay | Admitting: Certified Nurse Midwife

## 2017-02-10 ENCOUNTER — Ambulatory Visit (INDEPENDENT_AMBULATORY_CARE_PROVIDER_SITE_OTHER): Payer: Medicaid Other | Admitting: Obstetrics and Gynecology

## 2017-02-10 VITALS — BP 125/85 | HR 106 | Wt 190.7 lb

## 2017-02-10 DIAGNOSIS — O24112 Pre-existing diabetes mellitus, type 2, in pregnancy, second trimester: Secondary | ICD-10-CM

## 2017-02-10 DIAGNOSIS — O24113 Pre-existing diabetes mellitus, type 2, in pregnancy, third trimester: Secondary | ICD-10-CM | POA: Diagnosis not present

## 2017-02-10 DIAGNOSIS — O0993 Supervision of high risk pregnancy, unspecified, third trimester: Secondary | ICD-10-CM

## 2017-02-10 DIAGNOSIS — R8271 Bacteriuria: Secondary | ICD-10-CM

## 2017-02-10 DIAGNOSIS — Z3A34 34 weeks gestation of pregnancy: Secondary | ICD-10-CM

## 2017-02-10 DIAGNOSIS — O0990 Supervision of high risk pregnancy, unspecified, unspecified trimester: Secondary | ICD-10-CM

## 2017-02-10 NOTE — Progress Notes (Signed)
Pt denies concerns at this time. 

## 2017-02-10 NOTE — Addendum Note (Signed)
Addended by: Catalina AntiguaONSTANT, Aqsa Sensabaugh on: 02/10/2017 09:32 AM   Modules accepted: Orders

## 2017-02-10 NOTE — Progress Notes (Signed)
   PRENATAL VISIT NOTE  Subjective:  Elizabeth Warren is a 37 y.o. [id number] at 647w4d being seen today for ongoing prenatal care.  She is currently monitored for the following issues for this high-risk pregnancy and has Obesity (BMI 30-39.9); Supervision of high risk pregnancy, antepartum; Low vitamin D level; Elevated hemoglobin A1c; Pre-existing type 2 diabetes mellitus during pregnancy in second trimester; GBS bacteriuria; and Upper respiratory infection on their problem list.  Patient reports no complaints.  Contractions: Not present. Vag. Bleeding: None.  Movement: Present. Denies leaking of fluid.   The following portions of the patient's history were reviewed and updated as appropriate: allergies, current medications, past family history, past medical history, past social history, past surgical history and problem list. Problem list updated.  Objective:   Vitals:   02/10/17 0811  BP: 125/85  Pulse: (!) 106  Weight: 190 lb 11.2 oz (86.5 kg)    Fetal Status: Fetal Heart Rate (bpm): NST Fundal Height: 35 cm Movement: Present     General:  Alert, oriented and cooperative. Patient is in no acute distress.  Skin: Skin is warm and dry. No rash noted.   Cardiovascular: Normal heart rate noted  Respiratory: Normal respiratory effort, no problems with respiration noted  Abdomen: Soft, gravid, appropriate for gestational age.  Pain/Pressure: Absent     Pelvic: Cervical exam deferred        Extremities: Normal range of motion.  Edema: None  Mental Status:  Normal mood and affect. Normal behavior. Normal judgment and thought content.   Assessment and Plan:  Pregnancy: G5P4004 at 3647w4d  1. Supervision of high risk pregnancy, antepartum Patient is doing well without complaints  2. Pre-existing type 2 diabetes mellitus during pregnancy in second trimester Patient did not bring CBG log but reports improvement in her values. She states fasting values between 53-80 and pp 140-150.  Advised  patient to consume a protein rich snack at bedtime to avoid am hypoglycemia. Would like to see values before making insulin change. Growth ultrasound 1/23 NST reviewed and reactive with baseline 150, mod variability, + accels, no decels  3. GBS bacteriuria Tx in labor  Term labor symptoms and general obstetric precautions including but not limited to vaginal bleeding, contractions, leaking of fluid and fetal movement were reviewed in detail with the patient. Please refer to After Visit Summary for other counseling recommendations.  No Follow-up on file.   Catalina AntiguaPeggy Koya Hunger, MD

## 2017-02-13 ENCOUNTER — Ambulatory Visit (INDEPENDENT_AMBULATORY_CARE_PROVIDER_SITE_OTHER): Payer: Medicaid Other

## 2017-02-13 DIAGNOSIS — O24112 Pre-existing diabetes mellitus, type 2, in pregnancy, second trimester: Secondary | ICD-10-CM | POA: Diagnosis not present

## 2017-02-13 MED ORDER — GLUCOSE BLOOD VI STRP: ORAL_STRIP | 9 refills | Status: DC

## 2017-02-13 NOTE — Progress Notes (Signed)
Pt presents for Nurse Visit only for NST. Type 2 Diabetes (Pre Existing)  Pt states she needs more Strips.  NST reactive per Dr.Arnold.  B/P:110/78 P:98 Wt:188lbs

## 2017-02-17 ENCOUNTER — Other Ambulatory Visit: Payer: Self-pay | Admitting: Obstetrics and Gynecology

## 2017-02-17 ENCOUNTER — Ambulatory Visit (INDEPENDENT_AMBULATORY_CARE_PROVIDER_SITE_OTHER): Payer: Medicaid Other | Admitting: Obstetrics and Gynecology

## 2017-02-17 ENCOUNTER — Other Ambulatory Visit: Payer: Medicaid Other

## 2017-02-17 ENCOUNTER — Inpatient Hospital Stay (HOSPITAL_COMMUNITY)
Admission: AD | Admit: 2017-02-17 | Discharge: 2017-02-17 | Disposition: A | Discharge status: Home / Self Care | Payer: Medicaid Other | Source: Ambulatory Visit | Attending: Obstetrics and Gynecology | Admitting: Obstetrics and Gynecology

## 2017-02-17 ENCOUNTER — Encounter: Payer: Medicaid Other | Admitting: Obstetrics and Gynecology

## 2017-02-17 ENCOUNTER — Inpatient Hospital Stay (HOSPITAL_COMMUNITY)
Admission: AD | Admit: 2017-02-17 | Discharge: 2017-02-17 | Discharge status: Against Medical Advice | Payer: Medicaid Other | Source: Ambulatory Visit | Attending: Obstetrics and Gynecology | Admitting: Obstetrics and Gynecology

## 2017-02-17 ENCOUNTER — Encounter (HOSPITAL_COMMUNITY): Payer: Self-pay | Admitting: *Deleted

## 2017-02-17 ENCOUNTER — Encounter: Payer: Self-pay | Admitting: Obstetrics and Gynecology

## 2017-02-17 ENCOUNTER — Ambulatory Visit (HOSPITAL_COMMUNITY)
Admission: RE | Admit: 2017-02-17 | Discharge: 2017-02-17 | Disposition: A | Discharge status: Home / Self Care | Payer: Medicaid Other | Source: Ambulatory Visit | Attending: Obstetrics and Gynecology | Admitting: Obstetrics and Gynecology

## 2017-02-17 VITALS — BP 134/85 | HR 101 | Wt 193.9 lb

## 2017-02-17 DIAGNOSIS — O24113 Pre-existing diabetes mellitus, type 2, in pregnancy, third trimester: Secondary | ICD-10-CM

## 2017-02-17 DIAGNOSIS — Z7982 Long term (current) use of aspirin: Secondary | ICD-10-CM | POA: Insufficient documentation

## 2017-02-17 DIAGNOSIS — O24112 Pre-existing diabetes mellitus, type 2, in pregnancy, second trimester: Secondary | ICD-10-CM | POA: Insufficient documentation

## 2017-02-17 DIAGNOSIS — O288 Other abnormal findings on antenatal screening of mother: Secondary | ICD-10-CM

## 2017-02-17 DIAGNOSIS — Z3A35 35 weeks gestation of pregnancy: Secondary | ICD-10-CM

## 2017-02-17 DIAGNOSIS — O24414 Gestational diabetes mellitus in pregnancy, insulin controlled: Secondary | ICD-10-CM | POA: Insufficient documentation

## 2017-02-17 DIAGNOSIS — O0993 Supervision of high risk pregnancy, unspecified, third trimester: Secondary | ICD-10-CM

## 2017-02-17 DIAGNOSIS — Z3689 Encounter for other specified antenatal screening: Secondary | ICD-10-CM

## 2017-02-17 DIAGNOSIS — R8271 Bacteriuria: Secondary | ICD-10-CM

## 2017-02-17 DIAGNOSIS — O0990 Supervision of high risk pregnancy, unspecified, unspecified trimester: Secondary | ICD-10-CM

## 2017-02-17 MED ORDER — GLUCOSE BLOOD VI STRP: ORAL_STRIP | 9 refills | Status: DC

## 2017-02-17 NOTE — MAU Provider Note (Signed)
History     CSN: [id number]  Arrival date and time: 02/17/17 1644   First Provider Initiated Contact with Patient 02/17/17 1840      Chief Complaint  Patient presents with  . non reactive NST   HPI Elizabeth Warren is a 37 y.o. [id number] at [redacted]w[redacted]d who presents for fetal monitoring. Was seen earlier today but couldn't complete monitoring b/c she had to pick up her children from school so she left AMA.  Currently denies abdominal pain, vaginal bleeding, or LOF. Positive fetal movement.   OB History    Gravida Para Term Preterm AB Living   5 4 4  0 0 4   SAB TAB Ectopic Multiple Live Births   0 0 0 0 4      Past Medical History:  Diagnosis Date  . Gestational diabetes   . Hypertension   . NVD (normal vaginal delivery) 09/20/2010  . Pre-existing type 2 diabetes mellitus during pregnancy in second trimester 10/02/2016    Past Surgical History:  Procedure Laterality Date  . NO PAST SURGERIES      Family History  Problem Relation Age of Onset  . Diabetes Mother   . Hypertension Mother   . Diabetes Father   . Hypertension Father   . Diabetes Maternal Grandmother   . Hypertension Maternal Grandmother   . Diabetes Maternal Grandfather   . Hypertension Maternal Grandfather   . Diabetes Paternal Grandmother   . Hypertension Paternal Grandmother   . Diabetes Paternal Grandfather   . Hypertension Paternal Grandfather   . Cancer Neg Hx   . Heart disease Neg Hx   . Stroke Neg Hx     Social History   Tobacco Use  . Smoking status: Never Smoker  . Smokeless tobacco: Never Used  Substance Use Topics  . Alcohol use: No  . Drug use: No    Allergies: No Known Allergies  Medications Prior to Admission  Medication Sig Dispense Refill Last Dose  . ACCU-CHEK FASTCLIX LANCETS MISC 1 each by Does not apply route 4 (four) times daily. 100 each 9 Taking  . acetaminophen (TYLENOL) 500 MG tablet Take 500 mg by mouth every 6 (six) hours as needed for moderate pain.   Taking  .  aspirin 81 MG chewable tablet Chew 1 tablet (81 mg total) by mouth daily. 30 tablet 12 Taking  . calcium carbonate (TUMS - DOSED IN MG ELEMENTAL CALCIUM) 500 MG chewable tablet Chew 1 tablet by mouth 2 (two) times daily as needed for indigestion or heartburn.   Taking  . cephALEXin (KEFLEX) 500 MG capsule Take 1 capsule (500 mg total) by mouth 4 (four) times daily. 28 capsule 0 Taking  . cyclobenzaprine (FLEXERIL) 10 MG tablet Take 1 tablet (10 mg total) by mouth every 8 (eight) hours as needed for muscle spasms. 30 tablet 1 Taking  . glucose blood (ACCU-CHEK GUIDE) test strip Use 1 test strip to check blood glucose 4 times daily 100 each 9   . glyBURIDE (DIABETA) 2.5 MG tablet Take 1 tablet (2.5 mg total) by mouth 2 (two) times daily with a meal. 60 tablet 3 Taking  . glyBURIDE (DIABETA) 2.5 MG tablet Take 2 tablets with breakfast and 1 tablet at bedtime 90 tablet 4 Taking  . insulin NPH Human (HUMULIN N,NOVOLIN N) 100 UNIT/ML injection Inject 38 units before breakfast. Inject 14 units at bedtime with snack. 10 mL 5 Taking  . insulin regular (NOVOLIN R,HUMULIN R) 100 units/mL injection Inject 19 units before breakfast. Inject  14 units at bedtime with snack. 10 mL 5 Taking  . Insulin Syringe 27G X 1/2" 1 ML MISC 1 each by Does not apply route 4 (four) times daily. 100 each 5 Taking  . Prenat-FeFum-FePo-FA-DHA w/o A (PROVIDA DHA) 16-16-1.25-110 MG CAPS Take 1 tablet by mouth daily. 30 capsule 12 Taking  . Vitamin D, Ergocalciferol, (DRISDOL) 50000 units CAPS capsule Take 1 capsule (50,000 Units total) by mouth every 7 (seven) days. tuesday 14 capsule 5 Taking    Review of Systems  Constitutional: Negative.   Gastrointestinal: Negative.   Genitourinary: Negative.    Physical Exam   Blood pressure 111/83, pulse (!) 109, temperature (!) 97.5 F (36.4 C), resp. rate 18, last menstrual period 12/21/2015.  Physical Exam  Nursing note and vitals reviewed. Constitutional: She is oriented to person,  place, and time. She appears well-developed and well-nourished. No distress.  HENT:  Head: Normocephalic and atraumatic.  Eyes: Conjunctivae are normal. Right eye exhibits no discharge. Left eye exhibits no discharge. No scleral icterus.  Neck: Normal range of motion.  Respiratory: Effort normal. No respiratory distress.  GI: Soft. There is no tenderness.  Neurological: She is alert and oriented to person, place, and time.  Skin: Skin is warm and dry. She is not diaphoretic.  Psychiatric: She has a normal mood and affect. Her behavior is normal. Judgment and thought content normal.    MAU Course  Procedures [place] Mfm Fetal Bpp Wo Non Stress  Result Date: 02/17/2017 ----------------------------------------------------------------------  OBSTETRICS REPORT                      (Signed Final 02/17/2017 01:20 pm) ---------------------------------------------------------------------- Patient Info  ID #:       [id number]                          D.O.B.:  [birth year] (36 yrs)  Name:       Elizabeth Warren                 Visit Date: 02/17/2017 01:10 pm ---------------------------------------------------------------------- Performed By  Performed By:     Percell Boston          Ref. Address:     [address]                                                             [address]                                                             [place]  [id number]  Attending:        Charlsie MerlesMark Newman MD         [facility]  Referred By:      [facility] ---------------------------------------------------------------------- Orders   #  Description                                 Code   1  US MFM FETAL BPP WO NON STRESS              [id number]   ----------------------------------------------------------------------   #  Ordered By               Order #        Accession #    Episode #   1  PEGGY CONSTANT           [id number]      [id number]     [id number]  ---------------------------------------------------------------------- Indications   [redacted] weeks gestation of pregnancy                Z3A.35   Pre-existing diabetes, type 2, in pregnancy,   O24.113   third trimester   Non-reactive NST                               O28.9  ---------------------------------------------------------------------- OB History  Gravidity:    5         Term:   4        Prem:   0        SAB:   0  TOP:          0       Ectopic:  0        Living: 4 ---------------------------------------------------------------------- Fetal Evaluation  Num Of Fetuses:     1  Fetal Heart         155  Rate(bpm):  Cardiac Activity:   Observed  Presentation:       Cephalic  Amniotic Fluid  AFI FV:      Subjectively within normal limits  AFI Sum(cm)     %Tile       Largest Pocket(cm)  15.92           58          5.94  RUQ(cm)       RLQ(cm)       LUQ(cm)        LLQ(cm)  4.38          0             5.6            5.94 ---------------------------------------------------------------------- Biophysical Evaluation  Amniotic F.V:   Within normal limits       F. Tone:        Observed  F. Movement:    Observed                   Score:          6/8  F. Breathing:   Not Observed ---------------------------------------------------------------------- Gestational Age  LMP:  60w 4d        Date:  12/21/15                 EDD:   09/26/16  Best:          35w 4d     Det. By:  Previous Ultrasound      EDD:   03/20/17                                      (08/08/16) ---------------------------------------------------------------------- Impression  IUP at 35+4 weeks with type 2 DM and nonreactive NST  Normal amniotic fluid  BPP 6/8 with no fetal breathing ----------------------------------------------------------------------  Recommendations  Given NR NST recommend prolonged monitoring and repeat  BPP in AM ----------------------------------------------------------------------                 Charlsie Merles, MD Electronically Signed Final Report   02/17/2017 01:20 pm ----------------------------------------------------------------------   MDM BPP 6/8, off for breathing earlier today  NST:  Baseline: 145 bpm, Variability: Good {> 6 bpm), Accelerations: Reactive and Decelerations: Absent Reactive nst  Reviewed with Dr. Shawnie Pons. Will d/c home.   Assessment and Plan  A: 1. NST (non-stress test) reactive   2. [redacted] weeks gestation of pregnancy    P: Discharge home Fetal form counts given Discussed reasons to return to MAU Keep f/u appts in office  Judeth Horn 02/17/2017, 6:40 PM

## 2017-02-17 NOTE — MAU Note (Signed)
Pt has returned for further monitoring.

## 2017-02-17 NOTE — Progress Notes (Signed)
Patient reports good fetal movement, complains of occasional leg pain, relieved by tylenol. Denies contractions.

## 2017-02-17 NOTE — Progress Notes (Signed)
FHR not tracing @ this time, pt sitting up talking with provider & interpreter.

## 2017-02-17 NOTE — Progress Notes (Signed)
Patient walked out of unit prior to being given DC papers and signing AVS.  Judeth HornErin Lawrence, NP aware.  Ninfa MeekerJudy Lowe, RN aware.

## 2017-02-17 NOTE — Addendum Note (Signed)
Addended by: Natale MilchSTALLING, BRITTANY D on: 02/17/2017 11:06 AM   Modules accepted: Orders

## 2017-02-17 NOTE — Progress Notes (Signed)
   PRENATAL VISIT NOTE  Subjective:  Elizabeth Warren is a 37 y.o. [id number] at 638w4d being seen today for ongoing prenatal care.  She is currently monitored for the following issues for this high-risk pregnancy and has Obesity (BMI 30-39.9); Supervision of high risk pregnancy, antepartum; Low vitamin D level; Elevated hemoglobin A1c; Pre-existing type 2 diabetes mellitus during pregnancy in second trimester; GBS bacteriuria; and Upper respiratory infection on their problem list.  Patient reports no complaints.  Contractions: Not present. Vag. Bleeding: None.  Movement: Present. Denies leaking of fluid.   The following portions of the patient's history were reviewed and updated as appropriate: allergies, current medications, past family history, past medical history, past social history, past surgical history and problem list. Problem list updated.  Objective:   Vitals:   02/17/17 0959  BP: 134/85  Pulse: (!) 101  Weight: 193 lb 14.4 oz (88 kg)    Fetal Status: Fetal Heart Rate (bpm): nst   Movement: Present     General:  Alert, oriented and cooperative. Patient is in no acute distress.  Skin: Skin is warm and dry. No rash noted.   Cardiovascular: Normal heart rate noted  Respiratory: Normal respiratory effort, no problems with respiration noted  Abdomen: Soft, gravid, appropriate for gestational age.  Pain/Pressure: Absent     Pelvic: Cervical exam deferred        Extremities: Normal range of motion.  Edema: None  Mental Status:  Normal mood and affect. Normal behavior. Normal judgment and thought content.   Assessment and Plan:  Pregnancy: G5P4004 at 7138w4d  1. Supervision of high risk pregnancy, antepartum Patient is doing well without complaints  2. Pre-existing type 2 diabetes mellitus during pregnancy in second trimester Patient has not been checking her CBGs regularly as she ran out of testing strips and pharmacy would not provide with refill Patient reports decreasing her  evening insulin to 10 units R and 10 units NPH (instead of 14/14 prescribed) due to hypoglycemia. She reports feeling much better on this dosage. She used a neighbors meter and reports fasting as high as 80 and pp 130 Patient states that she has been walking more since her last visit Informed patient to bring CBG long at next visit Follow up growth ultrasound 1/23 NST reviewed and nonreactive with baseline 140, mod variability, 10x10 accels, no decels. [facility] Plan for IOL by 39 weeks - GC[facility]Chlamydia [facility] amp ()[facility] - glucose blood (ACCU-CHEK GUIDE) test strip; Use 1 test strip to check blood glucose 4 times daily  Dispense: 100 each; Refill: 9  3. GBS bacteriuria Will provide prophylaxis in labor  Preterm labor symptoms and general obstetric precautions including but not limited to vaginal bleeding, contractions, leaking of fluid and fetal movement were reviewed in detail with the patient. Please refer to After Visit Summary for other counseling recommendations.  Return in about 1 week (around 02/24/2017) for ROB.   Catalina AntiguaPeggy Jheri Mitter, MD

## 2017-02-17 NOTE — Discharge Instructions (Signed)

## 2017-02-17 NOTE — MAU Note (Signed)
Pt states she has to go to pick up her children, explained to pt by provider that further monitoring is needed.  Pt states she will return for monitoring.  AMA sign formed.

## 2017-02-17 NOTE — MAU Provider Note (Signed)
History     CSN: [id number]  Arrival date and time: 02/17/17 1318   First Provider Initiated Contact with Patient 02/17/17 1511      Chief Complaint  Patient presents with  . non reactive nst   HPI  Elizabeth Warren is a 37 y.o. [id number] at [redacted]w[redacted]d who presents from MFM for fetal monitoring. Patient was seen in office this morning & had NST d/t GDM. NST was non reactive so patient was sent to MFM for BPP. BPP 6/8, off for breathing. Patient denies abdominal pain, vaginal bleeding, decreased fetal movement, or LOF.   OB History    Gravida Para Term Preterm AB Living   5 4 4  0 0 4   SAB TAB Ectopic Multiple Live Births   0 0 0 0 4      Past Medical History:  Diagnosis Date  . Gestational diabetes   . Hypertension   . NVD (normal vaginal delivery) 09/20/2010  . Pre-existing type 2 diabetes mellitus during pregnancy in second trimester 10/02/2016    Past Surgical History:  Procedure Laterality Date  . NO PAST SURGERIES      Family History  Problem Relation Age of Onset  . Diabetes Mother   . Hypertension Mother   . Diabetes Father   . Hypertension Father   . Diabetes Maternal Grandmother   . Hypertension Maternal Grandmother   . Diabetes Maternal Grandfather   . Hypertension Maternal Grandfather   . Diabetes Paternal Grandmother   . Hypertension Paternal Grandmother   . Diabetes Paternal Grandfather   . Hypertension Paternal Grandfather   . Cancer Neg Hx   . Heart disease Neg Hx   . Stroke Neg Hx     Social History   Tobacco Use  . Smoking status: Never Smoker  . Smokeless tobacco: Never Used  Substance Use Topics  . Alcohol use: No  . Drug use: No    Allergies: No Known Allergies  Medications Prior to Admission  Medication Sig Dispense Refill Last Dose  . ACCU-CHEK FASTCLIX LANCETS MISC 1 each by Does not apply route 4 (four) times daily. 100 each 9 Taking  . acetaminophen (TYLENOL) 500 MG tablet Take 500 mg by mouth every 6 (six) hours as needed for  moderate pain.   Taking  . aspirin 81 MG chewable tablet Chew 1 tablet (81 mg total) by mouth daily. 30 tablet 12 Taking  . calcium carbonate (TUMS - DOSED IN MG ELEMENTAL CALCIUM) 500 MG chewable tablet Chew 1 tablet by mouth 2 (two) times daily as needed for indigestion or heartburn.   Taking  . cephALEXin (KEFLEX) 500 MG capsule Take 1 capsule (500 mg total) by mouth 4 (four) times daily. 28 capsule 0 Taking  . cyclobenzaprine (FLEXERIL) 10 MG tablet Take 1 tablet (10 mg total) by mouth every 8 (eight) hours as needed for muscle spasms. 30 tablet 1 Taking  . glucose blood (ACCU-CHEK GUIDE) test strip Use 1 test strip to check blood glucose 4 times daily 100 each 9   . glyBURIDE (DIABETA) 2.5 MG tablet Take 1 tablet (2.5 mg total) by mouth 2 (two) times daily with a meal. 60 tablet 3 Taking  . glyBURIDE (DIABETA) 2.5 MG tablet Take 2 tablets with breakfast and 1 tablet at bedtime 90 tablet 4 Taking  . insulin NPH Human (HUMULIN N,NOVOLIN N) 100 UNIT/ML injection Inject 38 units before breakfast. Inject 14 units at bedtime with snack. 10 mL 5 Taking  . insulin regular (NOVOLIN R,HUMULIN R)  100 units/mL injection Inject 19 units before breakfast. Inject 14 units at bedtime with snack. 10 mL 5 Taking  . Insulin Syringe 27G X 1/2" 1 ML MISC 1 each by Does not apply route 4 (four) times daily. 100 each 5 Taking  . Prenat-FeFum-FePo-FA-DHA w/o A (PROVIDA DHA) 16-16-1.25-110 MG CAPS Take 1 tablet by mouth daily. 30 capsule 12 Taking  . Vitamin D, Ergocalciferol, (DRISDOL) 50000 units CAPS capsule Take 1 capsule (50,000 Units total) by mouth every 7 (seven) days. tuesday 14 capsule 5 Taking    Review of Systems  Gastrointestinal: Negative.   Genitourinary: Negative.    Physical Exam   Blood pressure 123/83, pulse (!) 113, temperature 98 F (36.7 C), temperature source Oral, resp. rate 18, last menstrual period 12/21/2015.  Physical Exam  Nursing note and vitals reviewed. Constitutional: She is  oriented to person, place, and time. She appears well-developed and well-nourished. No distress.  HENT:  Head: Normocephalic and atraumatic.  Eyes: Conjunctivae are normal. Right eye exhibits no discharge. Left eye exhibits no discharge. No scleral icterus.  Neck: Normal range of motion.  Respiratory: Effort normal. No respiratory distress.  Neurological: She is alert and oriented to person, place, and time.  Skin: Skin is warm and dry. She is not diaphoretic.  Psychiatric: She has a normal mood and affect. Her behavior is normal. Judgment and thought content normal.    MAU Course  Procedures No results found for this or any previous visit (from the past 24 hour(s)). [place] [facility]fm Fetal Bpp Wo Non Stress  Result Date: 02/17/2017 ----------------------------------------------------------------------  OBSTETRICS REPORT                      (Signed Final 02/17/2017 01:20 pm) ---------------------------------------------------------------------- Patient Info  ID #:       [id number]                          D.O.B.:  1980/08/07 (36 yrs)  Name:       Elizabeth Warren                 Visit Date: 02/17/2017 01:10 pm ---------------------------------------------------------------------- Performed By  Performed By:     Percell Boston          Ref. Address:     [address]                                                             [phone]  [place]                                                             [phone]  Attending:        Charlsie MerlesMark Newman MD         [facility]  [facility]      [facility] ---------------------------------------------------------------------- Orders   #  Description                                 Code   1  US MFM FETAL BPP WO NON STRESS               [id number]  ----------------------------------------------------------------------   #  Ordered By               Order #        Accession #    Episode #   1  PEGGY CONSTANT           [id number]      [id number]     [id number]  ---------------------------------------------------------------------- Indications   [redacted] weeks gestation of pregnancy                Z3A.35   Pre-existing diabetes, type 2, in pregnancy,   O24.113   third trimester   Non-reactive NST                               O28.9  ---------------------------------------------------------------------- OB History  Gravidity:    5         Term:   4        Prem:   0        SAB:   0  TOP:          0       Ectopic:  0        Living: 4 ---------------------------------------------------------------------- Fetal Evaluation  Num Of Fetuses:     1  Fetal Heart         155  Rate(bpm):  Cardiac Activity:   Observed  Presentation:       Cephalic  Amniotic Fluid  AFI FV:      Subjectively within normal limits  AFI Sum(cm)     %Tile       Largest Pocket(cm)  15.92           58          5.94  RUQ(cm)       RLQ(cm)       LUQ(cm)        LLQ(cm)  4.38          0             5.6            5.94 ---------------------------------------------------------------------- Biophysical Evaluation  Amniotic F.V:   Within normal limits  F. Tone:        Observed  F. Movement:    Observed                   Score:          6/8  F. Breathing:   Not Observed ---------------------------------------------------------------------- Gestational Age  LMP:           60w 4d        Date:  12/21/15                 EDD:   09/26/16  Best:          35w 4d     Det. By:  Previous Ultrasound      EDD:   03/20/17                                      (08/08/16) ---------------------------------------------------------------------- Impression  IUP at 35+4 weeks with type 2 DM and nonreactive NST  Normal amniotic fluid  BPP 6/8 with no fetal breathing  ---------------------------------------------------------------------- Recommendations  Given NR NST recommend prolonged monitoring and repeat  BPP in AM ----------------------------------------------------------------------                 Charlsie Merles, MD Electronically Signed Final Report   02/17/2017 01:20 pm ----------------------------------------------------------------------   MDM Fetal tracing reassuring. Unable to determine if 15x15 accels present due to wandering baseline. Reviewed fetal tracing & BPP with Dr. Shawnie Pons. Will continue to monitor for additional hour & if reactive ok to discharge home.   Discussed results & recommendation with patient via video Arabic interpreter. Patient states she needs to leave now to pick her children up from school but will return afterwards to continue monitoring. To sign out AMA since we haven't completed her monitoring & especially since I don't have a continuous fetal tracing (likely some maternal tracing) while patient leaning forward in bed during our discussion. Pt verbalized understanding.   Assessment and Plan  A; 1. Non-reactive NST (non-stress test)   2. [redacted] weeks gestation of pregnancy    P: Pt signed out AMA    Judeth Horn 02/17/2017, 3:32 PM

## 2017-02-17 NOTE — MAU Note (Signed)
Pt sent from MD office for non-reactive NST, BPP 6/8.  To MAU for further monitoring, reports good FM.  Denies pain, bleeding or LOF.

## 2017-02-20 ENCOUNTER — Inpatient Hospital Stay (HOSPITAL_COMMUNITY): Payer: Medicaid Other

## 2017-02-20 ENCOUNTER — Inpatient Hospital Stay (HOSPITAL_COMMUNITY)
Admission: AD | Admit: 2017-02-20 | Discharge: 2017-02-20 | Disposition: A | Discharge status: Home / Self Care | Payer: Medicaid Other | Source: Ambulatory Visit | Attending: Obstetrics and Gynecology | Admitting: Obstetrics and Gynecology

## 2017-02-20 ENCOUNTER — Ambulatory Visit (INDEPENDENT_AMBULATORY_CARE_PROVIDER_SITE_OTHER): Payer: Medicaid Other

## 2017-02-20 ENCOUNTER — Inpatient Hospital Stay (HOSPITAL_COMMUNITY): Admission: RE | Admit: 2017-02-20 | Payer: Medicaid Other | Source: Ambulatory Visit

## 2017-02-20 ENCOUNTER — Encounter (HOSPITAL_COMMUNITY): Payer: Self-pay

## 2017-02-20 DIAGNOSIS — O24113 Pre-existing diabetes mellitus, type 2, in pregnancy, third trimester: Secondary | ICD-10-CM | POA: Diagnosis present

## 2017-02-20 DIAGNOSIS — R8271 Bacteriuria: Secondary | ICD-10-CM

## 2017-02-20 DIAGNOSIS — Z794 Long term (current) use of insulin: Secondary | ICD-10-CM | POA: Insufficient documentation

## 2017-02-20 DIAGNOSIS — O163 Unspecified maternal hypertension, third trimester: Secondary | ICD-10-CM | POA: Insufficient documentation

## 2017-02-20 DIAGNOSIS — Z7982 Long term (current) use of aspirin: Secondary | ICD-10-CM | POA: Insufficient documentation

## 2017-02-20 DIAGNOSIS — O98813 Other maternal infectious and parasitic diseases complicating pregnancy, third trimester: Secondary | ICD-10-CM | POA: Insufficient documentation

## 2017-02-20 DIAGNOSIS — O289 Unspecified abnormal findings on antenatal screening of mother: Secondary | ICD-10-CM | POA: Insufficient documentation

## 2017-02-20 DIAGNOSIS — O288 Other abnormal findings on antenatal screening of mother: Secondary | ICD-10-CM

## 2017-02-20 DIAGNOSIS — Z3A36 36 weeks gestation of pregnancy: Secondary | ICD-10-CM | POA: Diagnosis present

## 2017-02-20 DIAGNOSIS — Z8249 Family history of ischemic heart disease and other diseases of the circulatory system: Secondary | ICD-10-CM | POA: Insufficient documentation

## 2017-02-20 DIAGNOSIS — Z3689 Encounter for other specified antenatal screening: Secondary | ICD-10-CM

## 2017-02-20 DIAGNOSIS — O9989 Other specified diseases and conditions complicating pregnancy, childbirth and the puerperium: Secondary | ICD-10-CM | POA: Diagnosis not present

## 2017-02-20 DIAGNOSIS — O24112 Pre-existing diabetes mellitus, type 2, in pregnancy, second trimester: Secondary | ICD-10-CM

## 2017-02-20 DIAGNOSIS — J069 Acute upper respiratory infection, unspecified: Secondary | ICD-10-CM | POA: Insufficient documentation

## 2017-02-20 NOTE — MAU Note (Signed)
Sent from office, non-reactive NST

## 2017-02-20 NOTE — MAU Note (Addendum)
PT had NR NST in office. Sent for BPP. Pt is DM2 and said she had insulin this morning and needs to eat. No pain or LOF or bleeding.

## 2017-02-20 NOTE — MAU Note (Signed)
Urine sent to lab 

## 2017-02-20 NOTE — Discharge Instructions (Signed)
Braxton Hicks Contractions °Contractions of the uterus can occur throughout pregnancy, but they are not always a sign that you are in labor. You may have practice contractions called Braxton Hicks contractions. These false labor contractions are sometimes confused with true labor. °What are Braxton Hicks contractions? °Braxton Hicks contractions are tightening movements that occur in the muscles of the uterus before labor. Unlike true labor contractions, these contractions do not result in opening (dilation) and thinning of the cervix. Toward the end of pregnancy (32-34 weeks), Braxton Hicks contractions can happen more often and may become stronger. These contractions are sometimes difficult to tell apart from true labor because they can be very uncomfortable. You should not feel embarrassed if you go to the hospital with false labor. °Sometimes, the only way to tell if you are in true labor is for your health care provider to look for changes in the cervix. The health care provider will do a physical exam and may monitor your contractions. If you are not in true labor, the exam should show that your cervix is not dilating and your water has not broken. °If there are other health problems associated with your pregnancy, it is completely safe for you to be sent home with false labor. You may continue to have Braxton Hicks contractions until you go into true labor. °How to tell the difference between true labor and false labor °True labor °· Contractions last 30-70 seconds. °· Contractions become very regular. °· Discomfort is usually felt in the top of the uterus, and it spreads to the lower abdomen and low back. °· Contractions do not go away with walking. °· Contractions usually become more intense and increase in frequency. °· The cervix dilates and gets thinner. °False labor °· Contractions are usually shorter and not as strong as true labor contractions. °· Contractions are usually irregular. °· Contractions  are often felt in the front of the lower abdomen and in the groin. °· Contractions may go away when you walk around or change positions while lying down. °· Contractions get weaker and are shorter-lasting as time goes on. °· The cervix usually does not dilate or become thin. °Follow these instructions at home: °· Take over-the-counter and prescription medicines only as told by your health care provider. °· Keep up with your usual exercises and follow other instructions from your health care provider. °· Eat and drink lightly if you think you are going into labor. °· If Braxton Hicks contractions are making you uncomfortable: °? Change your position from lying down or resting to walking, or change from walking to resting. °? Sit and rest in a tub of warm water. °? Drink enough fluid to keep your urine pale yellow. Dehydration may cause these contractions. °? Do slow and deep breathing several times an hour. °· Keep all follow-up prenatal visits as told by your health care provider. This is important. °Contact a health care provider if: °· You have a fever. °· You have continuous pain in your abdomen. °Get help right away if: °· Your contractions become stronger, more regular, and closer together. °· You have fluid leaking or gushing from your vagina. °· You pass blood-tinged mucus (bloody show). °· You have bleeding from your vagina. °· You have low back pain that you never had before. °· You feel your baby’s head pushing down and causing pelvic pressure. °· Your baby is not moving inside you as much as it used to. °Summary °· Contractions that occur before labor are called Braxton   Hicks contractions, false labor, or practice contractions. °· Braxton Hicks contractions are usually shorter, weaker, farther apart, and less regular than true labor contractions. True labor contractions usually become progressively stronger and regular and they become more frequent. °· Manage discomfort from Braxton Hicks contractions by  changing position, resting in a warm bath, drinking plenty of water, or practicing deep breathing. °This information is not intended to replace advice given to you by your health care provider. Make sure you discuss any questions you have with your health care provider. °Document Released: 06/06/2016 Document Revised: 06/06/2016 Document Reviewed: 06/06/2016 °Elsevier Interactive Patient Education © 2018 [employer] ° °Fetal Movement Counts °Patient Name: ________________________________________________ Patient Due Date: ____________________ °What is a fetal movement count? °A fetal movement count is the number of times that you feel your baby move during a certain amount of time. This may also be called a fetal kick count. A fetal movement count is recommended for every pregnant woman. You may be asked to start counting fetal movements as early as week 28 of your pregnancy. °Pay attention to when your baby is most active. You may notice your baby's sleep and wake cycles. You may also notice things that make your baby move more. You should do a fetal movement count: °· When your baby is normally most active. °· At the same time each day. ° °A good time to count movements is while you are resting, after having something to eat and drink. °How do I count fetal movements? °1. Find a quiet, comfortable area. Sit, or lie down on your side. °2. Write down the date, the start time and stop time, and the number of movements that you felt between those two times. Take this information with you to your health care visits. °3. For 2 hours, count kicks, flutters, swishes, rolls, and jabs. You should feel at least 10 movements during 2 hours. °4. You may stop counting after you have felt 10 movements. °5. If you do not feel 10 movements in 2 hours, have something to eat and drink. Then, keep resting and counting for 1 hour. If you feel at least 4 movements during that hour, you may stop counting. °Contact a health care  provider if: °· You feel fewer than 4 movements in 2 hours. °· Your baby is not moving like he or she usually does. °Date: ____________ Start time: ____________ Stop time: ____________ Movements: ____________ °Date: ____________ Start time: ____________ Stop time: ____________ Movements: ____________ °Date: ____________ Start time: ____________ Stop time: ____________ Movements: ____________ °Date: ____________ Start time: ____________ Stop time: ____________ Movements: ____________ °Date: ____________ Start time: ____________ Stop time: ____________ Movements: ____________ °Date: ____________ Start time: ____________ Stop time: ____________ Movements: ____________ °Date: ____________ Start time: ____________ Stop time: ____________ Movements: ____________ °Date: ____________ Start time: ____________ Stop time: ____________ Movements: ____________ °Date: ____________ Start time: ____________ Stop time: ____________ Movements: ____________ °This information is not intended to replace advice given to you by your health care provider. Make sure you discuss any questions you have with your health care provider. °Document Released: 02/20/2006 Document Revised: 09/20/2015 Document Reviewed: 03/02/2015 °Elsevier Interactive Patient Education © 2018 [employer] Inc[employer] ° °

## 2017-02-20 NOTE — Progress Notes (Signed)
Pt presents for NST only Non reactive per Dr.Dove  Pt sent to MAU for BPP Orders placed  [facility]

## 2017-02-20 NOTE — MAU Provider Note (Signed)
History     CSN: [id number]  Arrival date and time: 02/20/17 0940   First Provider Initiated Contact with Patient 02/20/17 1005     Chief Complaint  Patient presents with  . Non-stress Test  . non-reactive NST   HPI Elizabeth Warren is a 37 y.o. [id number] at 1410w0d who presents from the office for a BPP because of a non reactive NST. She was being seen for routine testing in the office for A2GDM. Her NST in the office was non reactive. She denies any pain, vaginal bleeding or leaking of fluid. Reports good fetal movement.  OB History    Gravida Para Term Preterm AB Living   5 4 4  0 0 4   SAB TAB Ectopic Multiple Live Births   0 0 0 0 4      Past Medical History:  Diagnosis Date  . Gestational diabetes   . Hypertension   . NVD (normal vaginal delivery) 09/20/2010  . Pre-existing type 2 diabetes mellitus during pregnancy in second trimester 10/02/2016    Past Surgical History:  Procedure Laterality Date  . NO PAST SURGERIES      Family History  Problem Relation Age of Onset  . Diabetes Mother   . Hypertension Mother   . Diabetes Father   . Hypertension Father   . Diabetes Maternal Grandmother   . Hypertension Maternal Grandmother   . Diabetes Maternal Grandfather   . Hypertension Maternal Grandfather   . Diabetes Paternal Grandmother   . Hypertension Paternal Grandmother   . Diabetes Paternal Grandfather   . Hypertension Paternal Grandfather   . Cancer Neg Hx   . Heart disease Neg Hx   . Stroke Neg Hx     Social History   Tobacco Use  . Smoking status: Never Smoker  . Smokeless tobacco: Never Used  Substance Use Topics  . Alcohol use: No  . Drug use: No    Allergies: No Known Allergies  Medications Prior to Admission  Medication Sig Dispense Refill Last Dose  . ACCU-CHEK FASTCLIX LANCETS MISC 1 each by Does not apply route 4 (four) times daily. 100 each 9 Taking  . acetaminophen (TYLENOL) 500 MG tablet Take 500 mg by mouth every 6 (six) hours as needed  for moderate pain.   Taking  . aspirin 81 MG chewable tablet Chew 1 tablet (81 mg total) by mouth daily. 30 tablet 12 Taking  . calcium carbonate (TUMS - DOSED IN MG ELEMENTAL CALCIUM) 500 MG chewable tablet Chew 1 tablet by mouth 2 (two) times daily as needed for indigestion or heartburn.   Taking  . cephALEXin (KEFLEX) 500 MG capsule Take 1 capsule (500 mg total) by mouth 4 (four) times daily. 28 capsule 0 Taking  . cyclobenzaprine (FLEXERIL) 10 MG tablet Take 1 tablet (10 mg total) by mouth every 8 (eight) hours as needed for muscle spasms. 30 tablet 1 Taking  . glucose blood (ACCU-CHEK GUIDE) test strip Use 1 test strip to check blood glucose 4 times daily 100 each 9   . glyBURIDE (DIABETA) 2.5 MG tablet Take 1 tablet (2.5 mg total) by mouth 2 (two) times daily with a meal. 60 tablet 3 Taking  . glyBURIDE (DIABETA) 2.5 MG tablet Take 2 tablets with breakfast and 1 tablet at bedtime 90 tablet 4 Taking  . insulin NPH Human (HUMULIN N,NOVOLIN N) 100 UNIT/ML injection Inject 38 units before breakfast. Inject 14 units at bedtime with snack. 10 mL 5 Taking  . insulin regular (NOVOLIN R,HUMULIN  R) 100 units/mL injection Inject 19 units before breakfast. Inject 14 units at bedtime with snack. 10 mL 5 Taking  . Insulin Syringe 27G X 1/2" 1 ML MISC 1 each by Does not apply route 4 (four) times daily. 100 each 5 Taking  . Prenat-FeFum-FePo-FA-DHA w/o A (PROVIDA DHA) 16-16-1.25-110 MG CAPS Take 1 tablet by mouth daily. 30 capsule 12 Taking  . Vitamin D, Ergocalciferol, (DRISDOL) 50000 units CAPS capsule Take 1 capsule (50,000 Units total) by mouth every 7 (seven) days. tuesday 14 capsule 5 Taking    Review of Systems  Constitutional: Negative.  Negative for fatigue and fever.  HENT: Negative.   Respiratory: Negative.  Negative for shortness of breath.   Cardiovascular: Negative.  Negative for chest pain.  Gastrointestinal: Negative.  Negative for abdominal pain, constipation, diarrhea, nausea and  vomiting.  Genitourinary: Negative.  Negative for dysuria.  Neurological: Negative.  Negative for dizziness and headaches.   Physical Exam   Blood pressure 121/79, pulse (!) 109, temperature 98.7 F (37.1 C), temperature source Oral, resp. rate 16, height 5\' 5"  (1.651 m), weight 190 lb (86.2 kg), last menstrual period 12/21/2015.  Physical Exam  Nursing note and vitals reviewed. Constitutional: She is oriented to person, place, and time. She appears well-developed and well-nourished. No distress.  HENT:  Head: Normocephalic.  Eyes: Pupils are equal, round, and reactive to light.  Cardiovascular: Normal rate, regular rhythm and normal heart sounds.  Respiratory: Effort normal and breath sounds normal. No respiratory distress.  GI: Soft. Bowel sounds are normal. She exhibits no distension. There is no tenderness.  Neurological: She is alert and oriented to person, place, and time.  Skin: Skin is warm and dry.  Psychiatric: She has a normal mood and affect. Her behavior is normal. Judgment and thought content normal.   Fetal Tracing:  Baseline: 135 Variability: moderate Accels: 15x15 Decels: none  Toco: occasional uc's   MAU Course  Procedures  MDM NST- reactive in MAU BPP- 8/8, normal AFI  Assessment and Plan   1. NST (non-stress test) reactive   2. Upper respiratory tract infection, unspecified type   3. GBS bacteriuria   4. [redacted] weeks gestation of pregnancy    -Discharge home in stable condition -Preterm labor and kick count precautions discussed -Patient advised to follow-[facility] with [facility] as scheduled for prenatal care -Patient may return to MAU as needed or if her condition were to change or worsen  Rolm Bookbinder CNM 02/20/2017, 10:06 AM

## 2017-02-20 NOTE — MAU Note (Signed)
Pt was given juice and crackers w/PB

## 2017-02-24 ENCOUNTER — Encounter: Payer: Self-pay | Admitting: Obstetrics and Gynecology

## 2017-02-24 ENCOUNTER — Ambulatory Visit (INDEPENDENT_AMBULATORY_CARE_PROVIDER_SITE_OTHER): Payer: Medicaid Other | Admitting: Obstetrics and Gynecology

## 2017-02-24 ENCOUNTER — Other Ambulatory Visit (HOSPITAL_COMMUNITY)
Admission: RE | Admit: 2017-02-24 | Discharge: 2017-02-24 | Disposition: A | Discharge status: Home / Self Care | Payer: Medicaid Other | Source: Ambulatory Visit | Attending: Obstetrics and Gynecology | Admitting: Obstetrics and Gynecology

## 2017-02-24 VITALS — BP 119/80 | HR 98 | Wt 192.0 lb

## 2017-02-24 DIAGNOSIS — N898 Other specified noninflammatory disorders of vagina: Secondary | ICD-10-CM | POA: Insufficient documentation

## 2017-02-24 DIAGNOSIS — R8271 Bacteriuria: Secondary | ICD-10-CM

## 2017-02-24 DIAGNOSIS — O24112 Pre-existing diabetes mellitus, type 2, in pregnancy, second trimester: Secondary | ICD-10-CM | POA: Diagnosis not present

## 2017-02-24 DIAGNOSIS — O0992 Supervision of high risk pregnancy, unspecified, second trimester: Secondary | ICD-10-CM

## 2017-02-24 DIAGNOSIS — O0990 Supervision of high risk pregnancy, unspecified, unspecified trimester: Secondary | ICD-10-CM

## 2017-02-24 NOTE — Progress Notes (Signed)
   PRENATAL VISIT NOTE  Subjective:  Elizabeth Warren is a 37 y.o. [id number] at 7646w4d being seen today for ongoing prenatal care.  She is currently monitored for the following issues for this high-risk pregnancy and has Obesity (BMI 30-39.9); Supervision of high risk pregnancy, antepartum; Low vitamin D level; Elevated hemoglobin A1c; Pre-existing type 2 diabetes mellitus during pregnancy in second trimester; GBS bacteriuria; and Upper respiratory infection on their problem list.  Patient reports no complaints.  Contractions: Not present. Vag. Bleeding: None.  Movement: Present. Denies leaking of fluid.   Patient is not taking her insulin as prescribed, she is doing. She also does not have her logs with her  NPH 30 in am, 10 units at night Regular 10 units in am, 10 units at night FG: 70-90s PP: breakfast 110-120, lunch 130s, dinner 140s  The following portions of the patient's history were reviewed and updated as appropriate: allergies, current medications, past family history, past medical history, past social history, past surgical history and problem list. Problem list updated.  Objective:   Vitals:   02/24/17 0815  BP: 119/80  Pulse: 98  Weight: 192 lb (87.1 kg)    Fetal Status: Fetal Heart Rate (bpm): nst   Movement: Present     General:  Alert, oriented and cooperative. Patient is in no acute distress.  Skin: Skin is warm and dry. No rash noted.   Cardiovascular: Normal heart rate noted  Respiratory: Normal respiratory effort, no problems with respiration noted  Abdomen: Soft, gravid, appropriate for gestational age.  Pain/Pressure: Absent     Pelvic: Cervical exam deferred        Extremities: Normal range of motion.  Edema: None  Mental Status:  Normal mood and affect. Normal behavior. Normal judgment and thought content.   Assessment and Plan:  Pregnancy: G5P4004 at 5346w4d  1. Pre-existing type 2 diabetes mellitus during pregnancy in second trimester Has not been taking  insulin as prescribed, has self decreased doses as she reports she gets hypoglycemia, currently on NPH 30/10 and regular 10/10 Will increase NPH in am to 34 and regular to 12 in am CBGs not well controlled during the day NST reactive today Has BPP/growth scheduled for 1/23 Encouraged her to bring logs  2. Supervision of high risk pregnancy, antepartum  3. GBS bacteriuria ppx in labor  Preterm labor symptoms and general obstetric precautions including but not limited to vaginal bleeding, contractions, leaking of fluid and fetal movement were reviewed in detail with the patient. Please refer to After Visit Summary for other counseling recommendations.  Return in about 1 week (around 03/03/2017) for OB visit.   Conan BowensKelly M Davis, MD

## 2017-02-24 NOTE — Progress Notes (Signed)
NST DX GDM 

## 2017-02-25 DIAGNOSIS — Z0100 Encounter for examination of eyes and vision without abnormal findings: Secondary | ICD-10-CM | POA: Diagnosis not present

## 2017-02-25 LAB — CERVICOVAGINAL ANCILLARY ONLY
BACTERIAL VAGINITIS: NEGATIVE
CANDIDA VAGINITIS: NEGATIVE
CHLAMYDIA, DNA PROBE: NEGATIVE
NEISSERIA GONORRHEA: NEGATIVE
TRICH (WINDOWPATH): NEGATIVE

## 2017-02-26 ENCOUNTER — Ambulatory Visit (HOSPITAL_COMMUNITY)
Admission: RE | Admit: 2017-02-26 | Discharge: 2017-02-26 | Disposition: A | Discharge status: Home / Self Care | Payer: Medicaid Other | Source: Ambulatory Visit | Attending: Certified Nurse Midwife | Admitting: Certified Nurse Midwife

## 2017-02-26 ENCOUNTER — Other Ambulatory Visit (HOSPITAL_COMMUNITY): Payer: Self-pay | Admitting: *Deleted

## 2017-02-26 ENCOUNTER — Encounter (HOSPITAL_COMMUNITY): Payer: Self-pay

## 2017-02-26 DIAGNOSIS — O24113 Pre-existing diabetes mellitus, type 2, in pregnancy, third trimester: Secondary | ICD-10-CM | POA: Insufficient documentation

## 2017-02-26 DIAGNOSIS — Z794 Long term (current) use of insulin: Principal | ICD-10-CM

## 2017-02-26 DIAGNOSIS — O24319 Unspecified pre-existing diabetes mellitus in pregnancy, unspecified trimester: Secondary | ICD-10-CM

## 2017-02-26 DIAGNOSIS — Z3A36 36 weeks gestation of pregnancy: Secondary | ICD-10-CM | POA: Diagnosis not present

## 2017-02-26 DIAGNOSIS — O24112 Pre-existing diabetes mellitus, type 2, in pregnancy, second trimester: Secondary | ICD-10-CM | POA: Diagnosis present

## 2017-02-26 DIAGNOSIS — O0990 Supervision of high risk pregnancy, unspecified, unspecified trimester: Secondary | ICD-10-CM

## 2017-02-26 DIAGNOSIS — O24119 Pre-existing diabetes mellitus, type 2, in pregnancy, unspecified trimester: Secondary | ICD-10-CM

## 2017-02-26 NOTE — Procedures (Signed)
Richarda OsmondZuhal B Popper 04/26/1980 7561w6d  Fetus A Non-Stress Test Interpretation for 02/26/17  Indication: {MFM NST INDICATIONS:Failed BPP  Fetal Heart Rate A Mode: External Baseline Rate (A): 150 bpm Variability: Moderate Accelerations: 15 x 15 Decelerations: None Multiple birth?: No  Uterine Activity Mode: Toco Contraction Frequency (min): Occas Contraction Duration (sec): 20-50 Contraction Quality: Mild Resting Tone Palpated: Relaxed Resting Time: Adequate  Interpretation (Fetal Testing) Nonstress Test Interpretation: Reactive Comments: EFM tracing reviewed by Dr. Marjo Bickerenney. Dr. Marjo Bickerenney discussed with patient and patient was D/C'd.

## 2017-02-27 ENCOUNTER — Other Ambulatory Visit: Payer: Medicaid Other

## 2017-03-03 ENCOUNTER — Ambulatory Visit (INDEPENDENT_AMBULATORY_CARE_PROVIDER_SITE_OTHER): Payer: Medicaid Other

## 2017-03-03 DIAGNOSIS — O24313 Unspecified pre-existing diabetes mellitus in pregnancy, third trimester: Secondary | ICD-10-CM | POA: Diagnosis not present

## 2017-03-04 ENCOUNTER — Encounter (HOSPITAL_COMMUNITY): Payer: Self-pay | Admitting: *Deleted

## 2017-03-04 ENCOUNTER — Inpatient Hospital Stay (EMERGENCY_DEPARTMENT_HOSPITAL)
Admission: AD | Admit: 2017-03-04 | Discharge: 2017-03-05 | Disposition: A | Discharge status: Home / Self Care | Payer: Medicaid Other | Source: Ambulatory Visit | Attending: Obstetrics and Gynecology | Admitting: Obstetrics and Gynecology

## 2017-03-04 DIAGNOSIS — O479 False labor, unspecified: Secondary | ICD-10-CM

## 2017-03-04 DIAGNOSIS — O24113 Pre-existing diabetes mellitus, type 2, in pregnancy, third trimester: Secondary | ICD-10-CM | POA: Insufficient documentation

## 2017-03-04 DIAGNOSIS — Z794 Long term (current) use of insulin: Secondary | ICD-10-CM | POA: Insufficient documentation

## 2017-03-04 DIAGNOSIS — Z7982 Long term (current) use of aspirin: Secondary | ICD-10-CM | POA: Insufficient documentation

## 2017-03-04 DIAGNOSIS — R030 Elevated blood-pressure reading, without diagnosis of hypertension: Secondary | ICD-10-CM

## 2017-03-04 DIAGNOSIS — E119 Type 2 diabetes mellitus without complications: Secondary | ICD-10-CM | POA: Insufficient documentation

## 2017-03-04 DIAGNOSIS — J069 Acute upper respiratory infection, unspecified: Secondary | ICD-10-CM

## 2017-03-04 DIAGNOSIS — O133 Gestational [pregnancy-induced] hypertension without significant proteinuria, third trimester: Secondary | ICD-10-CM

## 2017-03-04 DIAGNOSIS — R8271 Bacteriuria: Secondary | ICD-10-CM

## 2017-03-04 DIAGNOSIS — Z3A37 37 weeks gestation of pregnancy: Secondary | ICD-10-CM

## 2017-03-04 NOTE — MAU Note (Signed)
PT SAYS  WAS AT OFFICE YESTERDAY - [facility] -  NO VE.     GBS- POSITIVE

## 2017-03-05 ENCOUNTER — Ambulatory Visit (HOSPITAL_COMMUNITY)
Admission: RE | Admit: 2017-03-05 | Discharge: 2017-03-05 | Disposition: A | Discharge status: Home / Self Care | Payer: Medicaid Other | Source: Ambulatory Visit | Attending: Certified Nurse Midwife | Admitting: Certified Nurse Midwife

## 2017-03-05 ENCOUNTER — Other Ambulatory Visit (HOSPITAL_COMMUNITY): Payer: Self-pay | Admitting: Obstetrics and Gynecology

## 2017-03-05 DIAGNOSIS — Z3A37 37 weeks gestation of pregnancy: Secondary | ICD-10-CM

## 2017-03-05 DIAGNOSIS — Z794 Long term (current) use of insulin: Principal | ICD-10-CM

## 2017-03-05 DIAGNOSIS — O24113 Pre-existing diabetes mellitus, type 2, in pregnancy, third trimester: Secondary | ICD-10-CM | POA: Insufficient documentation

## 2017-03-05 DIAGNOSIS — O133 Gestational [pregnancy-induced] hypertension without significant proteinuria, third trimester: Secondary | ICD-10-CM

## 2017-03-05 DIAGNOSIS — O471 False labor at or after 37 completed weeks of gestation: Secondary | ICD-10-CM | POA: Diagnosis not present

## 2017-03-05 DIAGNOSIS — O24319 Unspecified pre-existing diabetes mellitus in pregnancy, unspecified trimester: Secondary | ICD-10-CM

## 2017-03-05 DIAGNOSIS — E119 Type 2 diabetes mellitus without complications: Secondary | ICD-10-CM | POA: Insufficient documentation

## 2017-03-05 LAB — COMPREHENSIVE METABOLIC PANEL
ALK PHOS: 117 U/L (ref 38–126)
ALT: 21 U/L (ref 14–54)
ANION GAP: 10 (ref 5–15)
AST: 23 U/L (ref 15–41)
Albumin: 2.8 g/dL — ABNORMAL LOW (ref 3.5–5.0)
BUN: 8 mg/dL (ref 6–20)
CALCIUM: 9.3 mg/dL (ref 8.9–10.3)
CHLORIDE: 103 mmol/L (ref 101–111)
CO2: 20 mmol/L — ABNORMAL LOW (ref 22–32)
CREATININE: 0.42 mg/dL — AB (ref 0.44–1.00)
Glucose, Bld: 81 mg/dL (ref 65–99)
Potassium: 3.7 mmol/L (ref 3.5–5.1)
Sodium: 133 mmol/L — ABNORMAL LOW (ref 135–145)
Total Bilirubin: 0.2 mg/dL — ABNORMAL LOW (ref 0.3–1.2)
Total Protein: 7.2 g/dL (ref 6.5–8.1)

## 2017-03-05 LAB — CBC
HCT: 33.6 % — ABNORMAL LOW (ref 36.0–46.0)
Hemoglobin: 11.6 g/dL — ABNORMAL LOW (ref 12.0–15.0)
MCH: 26.9 pg (ref 26.0–34.0)
MCHC: 34.5 g/dL (ref 30.0–36.0)
MCV: 77.8 fL — ABNORMAL LOW (ref 78.0–100.0)
PLATELETS: 327 10*3/uL (ref 150–400)
RBC: 4.32 MIL/uL (ref 3.87–5.11)
RDW: 16.2 % — ABNORMAL HIGH (ref 11.5–15.5)
WBC: 9.3 10*3/uL (ref 4.0–10.5)

## 2017-03-05 LAB — PROTEIN / CREATININE RATIO, URINE: CREATININE, URINE: 20 mg/dL

## 2017-03-05 NOTE — MAU Provider Note (Signed)
Chief Complaint:  Labor Eval   First Provider Initiated Contact with Patient 03/05/17 0019     HPI: Elizabeth Warren is a 37 y.o. [id number] at 8w6dwho presents to maternity admissions reporting uterine contractions. Noted to be newly hypertensive.  So I was asked to evaluate her.  .Does have Gestational Diabletes. She reports good fetal movement, denies LOF, vaginal bleeding, vaginal itching/burning, urinary symptoms, h/a, dizziness, n/v, diarrhea, constipation or fever/chills.  She denies headache, visual changes or RUQ abdominal pain.  Hypertension  This is a new problem. The current episode started today. Pertinent negatives include no anxiety, blurred vision, chest pain, headaches, malaise/fatigue, peripheral edema or shortness of breath. There are no associated agents to hypertension. There are no known risk factors for coronary artery disease. Past treatments include nothing.    RN Note: PT SAYS  WAS AT OFFICE YESTERDAY - [facility] -  NO VE.     GBS- POSITIVE    Past Medical History: Past Medical History:  Diagnosis Date  . Gestational diabetes   . Hypertension   . NVD (normal vaginal delivery) 09/20/2010  . Pre-existing type 2 diabetes mellitus during pregnancy in second trimester 10/02/2016    Past obstetric history: OB History  Gravida Para Term Preterm AB Living  5 4 4  0 0 4  SAB TAB Ectopic Multiple Live Births  0 0 0 0 4    # Outcome Date GA Lbr Len/2nd Weight Sex Delivery Anes PTL Lv  5 Current           4 Term 09/20/10 [redacted]w[redacted]d 15:45 / 00:19 8 lb 7.8 oz (3.85 kg) F Vag-Spont EPI  LIV  3 Term 07/03/07    F Vag-Spont   LIV  2 Term 03/22/05    F Vag-Spont   LIV  1 Term 10/25/02    Rolm Baptise      Past Surgical History: Past Surgical History:  Procedure Laterality Date  . NO PAST SURGERIES      Family History: Family History  Problem Relation Age of Onset  . Diabetes Mother   . Hypertension Mother   . Diabetes Father   . Hypertension Father   . Diabetes  Maternal Grandmother   . Hypertension Maternal Grandmother   . Diabetes Maternal Grandfather   . Hypertension Maternal Grandfather   . Diabetes Paternal Grandmother   . Hypertension Paternal Grandmother   . Diabetes Paternal Grandfather   . Hypertension Paternal Grandfather   . Cancer Neg Hx   . Heart disease Neg Hx   . Stroke Neg Hx     Social History: Social History   Tobacco Use  . Smoking status: Never Smoker  . Smokeless tobacco: Never Used  Substance Use Topics  . Alcohol use: No  . Drug use: No    Allergies: No Known Allergies  Meds:  Medications Prior to Admission  Medication Sig Dispense Refill Last Dose  . ACCU-CHEK FASTCLIX LANCETS MISC 1 each by Does not apply route 4 (four) times daily. 100 each 9 Taking  . acetaminophen (TYLENOL) 500 MG tablet Take 500 mg by mouth every 6 (six) hours as needed for moderate pain.   Taking  . aspirin 81 MG chewable tablet Chew 1 tablet (81 mg total) by mouth daily. 30 tablet 12 Taking  . calcium carbonate (TUMS - DOSED IN MG ELEMENTAL CALCIUM) 500 MG chewable tablet Chew 1 tablet by mouth 2 (two) times daily as needed for indigestion or heartburn.   Taking  . cephALEXin (  KEFLEX) 500 MG capsule Take 1 capsule (500 mg total) by mouth 4 (four) times daily. (Patient not taking: Reported on 02/26/2017) 28 capsule 0 Not Taking  . cyclobenzaprine (FLEXERIL) 10 MG tablet Take 1 tablet (10 mg total) by mouth every 8 (eight) hours as needed for muscle spasms. 30 tablet 1 Taking  . glucose blood (ACCU-CHEK GUIDE) test strip Use 1 test strip to check blood glucose 4 times daily 100 each 9 Taking  . glyBURIDE (DIABETA) 2.5 MG tablet Take 1 tablet (2.5 mg total) by mouth 2 (two) times daily with a meal. (Patient not taking: Reported on 02/26/2017) 60 tablet 3 Not Taking  . glyBURIDE (DIABETA) 2.5 MG tablet Take 2 tablets with breakfast and 1 tablet at bedtime (Patient not taking: Reported on 02/26/2017) 90 tablet 4 Not Taking  . insulin NPH Human  (HUMULIN N,NOVOLIN N) 100 UNIT/ML injection Inject 38 units before breakfast. Inject 14 units at bedtime with snack. 10 mL 5 Taking  . insulin regular (NOVOLIN R,HUMULIN R) 100 units/mL injection Inject 19 units before breakfast. Inject 14 units at bedtime with snack. 10 mL 5 Taking  . Insulin Syringe 27G X 1/2" 1 ML MISC 1 each by Does not apply route 4 (four) times daily. 100 each 5 Taking  . Prenat-FeFum-FePo-FA-DHA w/o A (PROVIDA DHA) 16-16-1.25-110 MG CAPS Take 1 tablet by mouth daily. 30 capsule 12 Taking  . Vitamin D, Ergocalciferol, (DRISDOL) 50000 units CAPS capsule Take 1 capsule (50,000 Units total) by mouth every 7 (seven) days. tuesday 14 capsule 5 Taking    I have reviewed patient's Past Medical Hx, Surgical Hx, Family Hx, Social Hx, medications and allergies.   ROS:  Review of Systems  Constitutional: Negative for malaise/fatigue.  Eyes: Negative for blurred vision.  Respiratory: Negative for shortness of breath.   Cardiovascular: Negative for chest pain.  Neurological: Negative for headaches.   Other systems negative  Physical Exam   Patient Vitals for the past 24 hrs:  BP Temp Temp src Pulse Resp Height Weight  03/05/17 0014 (!) 135/96 - - (!) 103 - - -  03/04/17 2343 135/90 99.1 F (37.3 C) Oral (!) 114 20 4' 11.5" (1.511 m) 197 lb 12 oz (89.7 kg)   Vitals:   03/05/17 0100 03/05/17 0130 03/05/17 0145 03/05/17 0201  BP: (!) 138/93 (!) 130/94 133/89 117/77  Pulse: (!) 102 (!) 106 (!) 104 (!) 109  Resp:      Temp:      TempSrc:      Weight:      Height:       Vitals:   03/05/17 0130 03/05/17 0145 03/05/17 0201 03/05/17 0351  BP: (!) 130/94 133/89 117/77 131/82  Pulse: (!) 106 (!) 104 (!) 109 (!) 107  Resp:      Temp:      TempSrc:      Weight:      Height:        Constitutional: Well-developed, well-nourished female in no acute distress.  Cardiovascular: normal rate and rhythm Respiratory: normal effort, clear to auscultation bilaterally GI: Abd soft,  non-tender, gravid appropriate for gestational age.   No rebound or guarding. MS: Extremities nontender, no edema, normal ROM Neurologic: Alert and oriented x 4. DTRs 2+, no clonus GU: Neg CVAT.  PELVIC EXAM:   Dilation: 1 Effacement (%): 50 Station: Ballotable Presentation: Vertex Exam by:: lauren fields rn   FHT:  Baseline 140 , moderate variability, accelerations present, no decelerations Contractions: q 4-6 mins Irregular  Labs: Results for orders placed or performed during the hospital encounter of 03/04/17 (from the past 24 hour(s))  Protein / creatinine ratio, urine     Status: None   Collection Time: 03/04/17 11:47 PM  Result Value Ref Range   Creatinine, Urine 20.00 mg/dL   Total Protein, Urine <6.00 mg/dL   Protein Creatinine Ratio        0.00 - 0.15 mg/mg[Cre]  CBC     Status: Abnormal   Collection Time: 03/05/17 12:23 AM  Result Value Ref Range   WBC 9.3 4.0 - 10.5 K/uL   RBC 4.32 3.87 - 5.11 MIL/uL   Hemoglobin 11.6 (L) 12.0 - 15.0 g/dL   HCT 65.733.6 (L) 84.636.0 - 96.246.0 %   MCV 77.8 (L) 78.0 - 100.0 fL   MCH 26.9 26.0 - 34.0 pg   MCHC 34.5 30.0 - 36.0 g/dL   RDW 95.216.2 (H) 84.111.5 - 32.415.5 %   Platelets 327 150 - 400 K/uL  Comprehensive metabolic panel     Status: Abnormal   Collection Time: 03/05/17 12:23 AM  Result Value Ref Range   Sodium 133 (L) 135 - 145 mmol/L   Potassium 3.7 3.5 - 5.1 mmol/L   Chloride 103 101 - 111 mmol/L   CO2 20 (L) 22 - 32 mmol/L   Glucose, Bld 81 65 - 99 mg/dL   BUN 8 6 - 20 mg/dL   Creatinine, Ser 4.010.42 (L) 0.44 - 1.00 mg/dL   Calcium 9.3 8.9 - 02.710.3 mg/dL   Total Protein 7.2 6.5 - 8.1 g/dL   Albumin 2.8 (L) 3.5 - 5.0 g/dL   AST 23 15 - 41 U/L   ALT 21 14 - 54 U/L   Alkaline Phosphatase 117 38 - 126 U/L   Total Bilirubin 0.2 (L) 0.3 - 1.2 mg/dL   GFR calc non Af Amer >60 >60 mL/min   GFR calc Af Amer >60 >60 mL/min   Anion gap 10 5 - 15     A/Positive/-- (07/31 [id number])  Imaging:    MAU Course/MDM: I have ordered labs and  reviewed results.   Preeclampsia labs normal.   NST reviewed and reactive, category I Consult Dr Earlene Plateravis with presentation, exam findings and test results.  Wants to stay "because the contractions hurt". Discussed with her diabetes, delivery at 37 weeks is not desireable. Waited until it had been 4 hours since first high BP, the last one was not elevated Per Dr Earlene Plateravis will have her get recheck today (has US appt) and if elevated one more time, would need IOL. Also reviewed signs of labor  Assessment: Single IUP at 4159w6d Irregular uterine contractions DM2 New gestational hypertension, though does not meet 4 hour criteria yet  Plan: Discharge home Preeclampsia precautions Labor precautions and fetal kick counts Follow up in US today with BP check after US  Encouraged to return here or to other Urgent Care/ED if she develops worsening of symptoms, increase in pain, fever, or other concerning symptoms.   Pt stable at time of discharge.  Wynelle BourgeoisMarie Baya Lentz CNM, MSN Certified Nurse-Midwife 03/05/2017 12:20 AM

## 2017-03-05 NOTE — Discharge Instructions (Signed)
Hypertension During Pregnancy °Hypertension is also called high blood pressure. High blood pressure means that the force of your blood moving in your body is too strong. When you are pregnant, this condition should be watched carefully. It can cause problems for you and your baby. °Follow these instructions at home: °Eating and drinking °· Drink enough fluid to keep your pee (urine) clear or pale yellow. °· Eat healthy foods that are low in salt (sodium). °? Do not add salt to your food. °? Check labels on foods and drinks to see much salt is in them. Look on the label where you see "Sodium." °Lifestyle °· Do not use any products that contain nicotine or tobacco, such as cigarettes and e-cigarettes. If you need help quitting, ask your doctor. °· Do not use alcohol. °· Avoid caffeine. °· Avoid stress. Rest and get plenty of sleep. °General instructions °· Take over-the-counter and prescription medicines only as told by your doctor. °· While lying down, lie on your left side. This keeps pressure off your baby. °· While sitting or lying down, raise (elevate) your feet. Try putting some pillows under your lower legs. °· Exercise regularly. Ask your doctor what kinds of exercise are best for you. °· Keep all prenatal and follow-up visits as told by your doctor. This is important. °Contact a doctor if: °· You have symptoms that your doctor told you to watch for, such as: °? Fever. °? Throwing up (vomiting). °? Headache. °Get help right away if: °· You have very bad pain in your belly (abdomen). °· You are throwing up, and this does not get better with treatment. °· You suddenly get swelling in your hands, ankles, or face. °· You gain 4 lb (1.8 kg) or more in 1 week. °· You get bleeding from your vagina. °· You have blood in your pee. °· You do not feel your baby moving as much as normal. °· You have a change in vision. °· You have muscle twitching or sudden tightening (spasms). °· You have trouble breathing. °· Your lips  or fingernails turn blue. °This information is not intended to replace advice given to you by your health care provider. Make sure you discuss any questions you have with your health care provider. °Document Released: 02/23/2010 Document Revised: 10/03/2015 Document Reviewed: 10/03/2015 °Elsevier Interactive Patient Education © 2018 [employer] °Vaginal Delivery °Vaginal delivery means that you will give birth by pushing your baby out of your birth canal (vagina). A team of health care providers will help you before, during, and after vaginal delivery. Birth experiences are unique for every woman and every pregnancy, and birth experiences vary depending on where you choose to give birth. °What should I do to prepare for my baby's birth? °Before your baby is born, it is important to talk with your health care provider about: °· Your labor and delivery preferences. These may include: °? Medicines that you may be given. °? How you will manage your pain. This might include non-medical pain relief techniques or injectable pain relief such as epidural analgesia. °? How you and your baby will be monitored during labor and delivery. °? Who may be in the labor and delivery room with you. °? Your feelings about surgical delivery of your baby (cesarean delivery, or C-section) if this becomes necessary. °? Your feelings about receiving donated blood through an IV tube (blood transfusion) if this becomes necessary. °· Whether you are able: °? To take pictures or videos of the birth. °? To eat   during labor and delivery. °? To move around, walk, or change positions during labor and delivery. °· What to expect after your baby is born, such as: °? Whether delayed umbilical cord clamping and cutting is offered. °? Who will care for your baby right after birth. °? Medicines or tests that may be recommended for your baby. °? Whether breastfeeding is supported in your hospital or birth center. °? How long you will be in the hospital  or birth center. °· How any medical conditions you have may affect your baby or your labor and delivery experience. ° °To prepare for your baby's birth, you should also: °· Attend all of your health care visits before delivery (prenatal visits) as recommended by your health care provider. This is important. °· Prepare your home for your baby's arrival. Make sure that you have: °? Diapers. °? Baby clothing. °? Feeding equipment. °? Safe sleeping arrangements for you and your baby. °· Install a car seat in your vehicle. Have your car seat checked by a certified car seat installer to make sure that it is installed safely. °· Think about who will help you with your new baby at home for at least the first several weeks after delivery. ° °What can I expect when I arrive at the birth center or hospital? °Once you are in labor and have been admitted into the hospital or birth center, your health care provider may: °· Review your pregnancy history and any concerns you have. °· Insert an IV tube into one of your veins. This is used to give you fluids and medicines. °· Check your blood pressure, pulse, temperature, and heart rate (vital signs). °· Check whether your bag of water (amniotic sac) has broken (ruptured). °· Talk with you about your birth plan and discuss pain control options. ° °Monitoring °Your health care provider may monitor your contractions (uterine monitoring) and your baby's heart rate (fetal monitoring). You may need to be monitored: °· Often, but not continuously (intermittently). °· All the time or for long periods at a time (continuously). Continuous monitoring may be needed if: °? You are taking certain medicines, such as medicine to relieve pain or make your contractions stronger. °? You have pregnancy or labor complications. ° °Monitoring may be done by: °· Placing a special stethoscope or a handheld monitoring device on your abdomen to check your baby's heartbeat, and feeling your abdomen for  contractions. This method of monitoring does not continuously record your baby's heartbeat or your contractions. °· Placing monitors on your abdomen (external monitors) to record your baby's heartbeat and the frequency and length of contractions. You may not have to wear external monitors all the time. °· Placing monitors inside of your uterus (internal monitors) to record your baby's heartbeat and the frequency, length, and strength of your contractions. °? Your health care provider may use internal monitors if he or she needs more information about the strength of your contractions or your baby's heart rate. °? Internal monitors are put in place by passing a thin, flexible wire through your vagina and into your uterus. Depending on the type of monitor, it may remain in your uterus or on your baby's head until birth. °? Your health care provider will discuss the benefits and risks of internal monitoring with you and will ask for your permission before inserting the monitors. °· Telemetry. This is a type of continuous monitoring that can be done with external or internal monitors. Instead of having to stay in bed, you   are able to move around during telemetry. Ask your health care provider if telemetry is an option for you. ° °Physical exam °Your health care provider may perform a physical exam. This may include: °· Checking whether your baby is positioned: °? With the head toward your vagina (head-down). This is most common. °? With the head toward the top of your uterus (head-up or breech). If your baby is in a breech position, your health care provider may try to turn your baby to a head-down position so you can deliver vaginally. If it does not seem that your baby can be born vaginally, your provider may recommend surgery to deliver your baby. In rare cases, you may be able to deliver vaginally if your baby is head-up (breech delivery). °? Lying sideways (transverse). Babies that are lying sideways cannot be  delivered vaginally. °· Checking your cervix to determine: °? Whether it is thinning out (effacing). °? Whether it is opening up (dilating). °? How low your baby has moved into your birth canal. ° °What are the three stages of labor and delivery? ° °Normal labor and delivery is divided into the following three stages: °Stage 1 °· Stage 1 is the longest stage of labor, and it can last for hours or days. Stage 1 includes: °? Early labor. This is when contractions may be irregular, or regular and mild. Generally, early labor contractions are more than 10 minutes apart. °? Active labor. This is when contractions get longer, more regular, more frequent, and more intense. °? The transition phase. This is when contractions happen very close together, are very intense, and may last longer than during any other part of labor. °· Contractions generally feel mild, infrequent, and irregular at first. They get stronger, more frequent (about every 2-3 minutes), and more regular as you progress from early labor through active labor and transition. °· Many women progress through stage 1 naturally, but you may need help to continue making progress. If this happens, your health care provider may talk with you about: °? Rupturing your amniotic sac if it has not ruptured yet. °? Giving you medicine to help make your contractions stronger and more frequent. °· Stage 1 ends when your cervix is completely dilated to 4 inches (10 cm) and completely effaced. This happens at the end of the transition phase. °Stage 2 °· Once your cervix is completely effaced and dilated to 4 inches (10 cm), you may start to feel an urge to push. It is common for the body to naturally take a rest before feeling the urge to push, especially if you received an epidural or certain other pain medicines. This rest period may last for up to 1-2 hours, depending on your unique labor experience. °· During stage 2, contractions are generally less painful, because  pushing helps relieve contraction pain. Instead of contraction pain, you may feel stretching and burning pain, especially when the widest part of your baby's head passes through the vaginal opening (crowning). °· Your health care provider will closely monitor your pushing progress and your baby's progress through the vagina during stage 2. °· Your health care provider may massage the area of skin between your vaginal opening and anus (perineum) or apply warm compresses to your perineum. This helps it stretch as the baby's head starts to crown, which can help prevent perineal tearing. °? In some cases, an incision may be made in your perineum (episiotomy) to allow the baby to pass through the vaginal opening. An episiotomy helps to   make the opening of the vagina larger to allow more room for the baby to fit through. °· It is very important to breathe and focus so your health care provider can control the delivery of your baby's head. Your health care provider may have you decrease the intensity of your pushing, to help prevent perineal tearing. °· After delivery of your baby's head, the shoulders and the rest of the body generally deliver very quickly and without difficulty. °· Once your baby is delivered, the umbilical cord may be cut right away, or this may be delayed for 1-2 minutes, depending on your baby's health. This may vary among health care providers, hospitals, and birth centers. °· If you and your baby are healthy enough, your baby may be placed on your chest or abdomen to help maintain the baby's temperature and to help you bond with each other. Some mothers and babies start breastfeeding at this time. Your health care team will dry your baby and help keep your baby warm during this time. °· Your baby may need immediate care if he or she: °? Showed signs of distress during labor. °? Has a medical condition. °? Was born too early (prematurely). °? Had a bowel movement before birth (meconium). °? Shows  signs of difficulty transitioning from being inside the uterus to being outside of the uterus. °If you are planning to breastfeed, your health care team will help you begin a feeding. °Stage 3 °· The third stage of labor starts immediately after the birth of your baby and ends after you deliver the placenta. The placenta is an organ that develops during pregnancy to provide oxygen and nutrients to your baby in the womb. °· Delivering the placenta may require some pushing, and you may have mild contractions. Breastfeeding can stimulate contractions to help you deliver the placenta. °· After the placenta is delivered, your uterus should tighten (contract) and become firm. This helps to stop bleeding in your uterus. To help your uterus contract and to control bleeding, your health care provider may: °? Give you medicine by injection, through an IV tube, by mouth, or through your rectum (rectally). °? Massage your abdomen or perform a vaginal exam to remove any blood clots that are left in your uterus. °? Empty your bladder by placing a thin, flexible tube (catheter) into your bladder. °? Encourage you to breastfeed your baby. °After labor is over, you and your baby will be monitored closely to ensure that you are both healthy until you are ready to go home. Your health care team will teach you how to care for yourself and your baby. °This information is not intended to replace advice given to you by your health care provider. Make sure you discuss any questions you have with your health care provider. °Document Released: 10/31/2007 Document Revised: 08/11/2015 Document Reviewed: 02/05/2015 °Elsevier Interactive Patient Education © 2018 [employer]. ° °

## 2017-03-06 ENCOUNTER — Telehealth (HOSPITAL_COMMUNITY): Payer: Self-pay | Admitting: *Deleted

## 2017-03-06 ENCOUNTER — Inpatient Hospital Stay (HOSPITAL_COMMUNITY): Payer: Medicaid Other | Admitting: Anesthesiology

## 2017-03-06 ENCOUNTER — Ambulatory Visit (INDEPENDENT_AMBULATORY_CARE_PROVIDER_SITE_OTHER): Payer: Medicaid Other | Admitting: Obstetrics and Gynecology

## 2017-03-06 ENCOUNTER — Other Ambulatory Visit: Payer: Medicaid Other

## 2017-03-06 ENCOUNTER — Encounter: Payer: Self-pay | Admitting: Obstetrics and Gynecology

## 2017-03-06 ENCOUNTER — Inpatient Hospital Stay (HOSPITAL_COMMUNITY)
Admission: AD | Admit: 2017-03-06 | Discharge: 2017-03-09 | DRG: 807 | Disposition: A | Discharge status: Home / Self Care | Payer: Medicaid Other | Source: Ambulatory Visit | Attending: Obstetrics and Gynecology | Admitting: Obstetrics and Gynecology

## 2017-03-06 VITALS — BP 135/91 | HR 108 | Wt 196.8 lb

## 2017-03-06 DIAGNOSIS — Z3A38 38 weeks gestation of pregnancy: Secondary | ICD-10-CM

## 2017-03-06 DIAGNOSIS — O0990 Supervision of high risk pregnancy, unspecified, unspecified trimester: Secondary | ICD-10-CM

## 2017-03-06 DIAGNOSIS — Z794 Long term (current) use of insulin: Secondary | ICD-10-CM | POA: Diagnosis not present

## 2017-03-06 DIAGNOSIS — O139 Gestational [pregnancy-induced] hypertension without significant proteinuria, unspecified trimester: Secondary | ICD-10-CM

## 2017-03-06 DIAGNOSIS — E119 Type 2 diabetes mellitus without complications: Secondary | ICD-10-CM | POA: Diagnosis present

## 2017-03-06 DIAGNOSIS — O99213 Obesity complicating pregnancy, third trimester: Secondary | ICD-10-CM

## 2017-03-06 DIAGNOSIS — O2412 Pre-existing diabetes mellitus, type 2, in childbirth: Secondary | ICD-10-CM | POA: Diagnosis present

## 2017-03-06 DIAGNOSIS — O99214 Obesity complicating childbirth: Secondary | ICD-10-CM | POA: Diagnosis present

## 2017-03-06 DIAGNOSIS — O0993 Supervision of high risk pregnancy, unspecified, third trimester: Secondary | ICD-10-CM

## 2017-03-06 DIAGNOSIS — E669 Obesity, unspecified: Secondary | ICD-10-CM | POA: Diagnosis present

## 2017-03-06 DIAGNOSIS — O24113 Pre-existing diabetes mellitus, type 2, in pregnancy, third trimester: Secondary | ICD-10-CM

## 2017-03-06 DIAGNOSIS — O133 Gestational [pregnancy-induced] hypertension without significant proteinuria, third trimester: Secondary | ICD-10-CM

## 2017-03-06 DIAGNOSIS — Z8632 Personal history of gestational diabetes: Secondary | ICD-10-CM | POA: Diagnosis present

## 2017-03-06 DIAGNOSIS — O99824 Streptococcus B carrier state complicating childbirth: Secondary | ICD-10-CM | POA: Diagnosis present

## 2017-03-06 DIAGNOSIS — Z3A37 37 weeks gestation of pregnancy: Secondary | ICD-10-CM | POA: Diagnosis not present

## 2017-03-06 DIAGNOSIS — R8271 Bacteriuria: Secondary | ICD-10-CM

## 2017-03-06 DIAGNOSIS — O24112 Pre-existing diabetes mellitus, type 2, in pregnancy, second trimester: Secondary | ICD-10-CM

## 2017-03-06 DIAGNOSIS — O134 Gestational [pregnancy-induced] hypertension without significant proteinuria, complicating childbirth: Principal | ICD-10-CM | POA: Diagnosis present

## 2017-03-06 DIAGNOSIS — Z7982 Long term (current) use of aspirin: Secondary | ICD-10-CM

## 2017-03-06 DIAGNOSIS — O9902 Anemia complicating childbirth: Secondary | ICD-10-CM | POA: Diagnosis present

## 2017-03-06 DIAGNOSIS — D573 Sickle-cell trait: Secondary | ICD-10-CM | POA: Diagnosis present

## 2017-03-06 DIAGNOSIS — O471 False labor at or after 37 completed weeks of gestation: Secondary | ICD-10-CM | POA: Diagnosis not present

## 2017-03-06 LAB — GLUCOSE, CAPILLARY
GLUCOSE-CAPILLARY: 78 mg/dL (ref 65–99)
Glucose-Capillary: 70 mg/dL (ref 65–99)

## 2017-03-06 LAB — CBC
HCT: 31.5 % — ABNORMAL LOW (ref 36.0–46.0)
HEMATOCRIT: 34.9 % — AB (ref 36.0–46.0)
HEMOGLOBIN: 11.7 g/dL — AB (ref 12.0–15.0)
HEMOGLOBIN: 10.4 g/dL — AB (ref 12.0–15.0)
MCH: 25.8 pg — ABNORMAL LOW (ref 26.0–34.0)
MCH: 25.6 pg — ABNORMAL LOW (ref 26.0–34.0)
MCHC: 33.5 g/dL (ref 30.0–36.0)
MCHC: 33 g/dL (ref 30.0–36.0)
MCV: 77 fL — ABNORMAL LOW (ref 78.0–100.0)
MCV: 77.6 fL — ABNORMAL LOW (ref 78.0–100.0)
Platelets: 344 10*3/uL (ref 150–400)
Platelets: 294 10*3/uL (ref 150–400)
RBC: 4.53 MIL/uL (ref 3.87–5.11)
RBC: 4.06 MIL/uL (ref 3.87–5.11)
RDW: 16.3 % — AB (ref 11.5–15.5)
RDW: 16.3 % — AB (ref 11.5–15.5)
WBC: 12 10*3/uL — AB (ref 4.0–10.5)
WBC: 10.5 10*3/uL (ref 4.0–10.5)

## 2017-03-06 LAB — COMPREHENSIVE METABOLIC PANEL
ALK PHOS: 132 U/L — AB (ref 38–126)
ALT: 25 U/L (ref 14–54)
AST: 23 U/L (ref 15–41)
Albumin: 2.9 g/dL — ABNORMAL LOW (ref 3.5–5.0)
Anion gap: 9 (ref 5–15)
BUN: 9 mg/dL (ref 6–20)
CHLORIDE: 101 mmol/L (ref 101–111)
CO2: 22 mmol/L (ref 22–32)
CREATININE: 0.44 mg/dL (ref 0.44–1.00)
Calcium: 9.3 mg/dL (ref 8.9–10.3)
GFR calc Af Amer: >60 mL/min (ref >60)
Glucose, Bld: 63 mg/dL — ABNORMAL LOW (ref 65–99)
Potassium: 3.7 mmol/L (ref 3.5–5.1)
Sodium: 132 mmol/L — ABNORMAL LOW (ref 135–145)
Total Bilirubin: 0.1 mg/dL — ABNORMAL LOW (ref 0.3–1.2)
Total Protein: 7.1 g/dL (ref 6.5–8.1)

## 2017-03-06 LAB — PROTEIN / CREATININE RATIO, URINE
CREATININE, URINE: 100 mg/dL
Protein Creatinine Ratio: 0.16 mg/mg{Cre} — ABNORMAL HIGH (ref 0.00–0.15)
Total Protein, Urine: 16 mg/dL

## 2017-03-06 LAB — TYPE AND SCREEN
ABO/RH(D): A POS
ANTIBODY SCREEN: NEGATIVE

## 2017-03-06 LAB — ABO/RH: ABO/RH(D): A POS

## 2017-03-06 MED ORDER — PENICILLIN G POTASSIUM 5000000 UNITS IJ SOLR
5.0000 10*6.[IU] | Freq: Once | INTRAVENOUS | Status: AC
  Administered 2017-03-06: 5 10*6.[IU] via INTRAVENOUS
  Filled 2017-03-06: qty 5

## 2017-03-06 MED ORDER — PHENYLEPHRINE 40 MCG/ML (10ML) SYRINGE FOR IV PUSH (FOR BLOOD PRESSURE SUPPORT)
80.0000 ug | PREFILLED_SYRINGE | INTRAVENOUS | Status: DC | PRN
  Filled 2017-03-06: qty 5
  Filled 2017-03-06: qty 10

## 2017-03-06 MED ORDER — MISOPROSTOL 50MCG HALF TABLET
50.0000 ug | ORAL_TABLET | ORAL | Status: DC | PRN
  Administered 2017-03-06 (×2): 50 ug via ORAL
  Filled 2017-03-06 (×2): qty 1

## 2017-03-06 MED ORDER — DIPHENHYDRAMINE HCL 50 MG/ML IJ SOLN: 12.5000 mg | INTRAMUSCULAR | Status: DC | PRN

## 2017-03-06 MED ORDER — LIDOCAINE HCL (PF) 1 % IJ SOLN
30.0000 mL | INTRAMUSCULAR | Status: DC | PRN
  Filled 2017-03-06: qty 30

## 2017-03-06 MED ORDER — EPHEDRINE 5 MG/ML INJ
10.0000 mg | INTRAVENOUS | Status: DC | PRN
  Filled 2017-03-06: qty 2

## 2017-03-06 MED ORDER — LACTATED RINGERS IV SOLN: 500.0000 mL | Freq: Once | INTRAVENOUS | Status: DC

## 2017-03-06 MED ORDER — OXYCODONE-ACETAMINOPHEN 5-325 MG PO TABS: 1.0000 | ORAL_TABLET | ORAL | Status: DC | PRN

## 2017-03-06 MED ORDER — FENTANYL CITRATE (PF) 100 MCG/2ML IJ SOLN
100.0000 ug | INTRAMUSCULAR | Status: DC | PRN
  Administered 2017-03-06: 100 ug via INTRAVENOUS
  Administered 2017-03-06: 50 ug via INTRAVENOUS
  Administered 2017-03-06: 100 ug via INTRAVENOUS
  Filled 2017-03-06 (×3): qty 2

## 2017-03-06 MED ORDER — EPHEDRINE 5 MG/ML INJ
10.0000 mg | INTRAVENOUS | Status: DC | PRN
Start: 2017-03-06 — End: 2017-03-07
  Filled 2017-03-06: qty 2

## 2017-03-06 MED ORDER — TERBUTALINE SULFATE 1 MG/ML IJ SOLN
0.2500 mg | Freq: Once | INTRAMUSCULAR | Status: DC | PRN
  Filled 2017-03-06: qty 1

## 2017-03-06 MED ORDER — LACTATED RINGERS IV SOLN: 500.0000 mL | INTRAVENOUS | Status: DC | PRN

## 2017-03-06 MED ORDER — ONDANSETRON HCL 4 MG/2ML IJ SOLN: 4.0000 mg | Freq: Four times a day (QID) | INTRAMUSCULAR | Status: DC | PRN

## 2017-03-06 MED ORDER — OXYTOCIN BOLUS FROM INFUSION
500.0000 mL | Freq: Once | INTRAVENOUS | Status: AC
  Administered 2017-03-07: 500 mL via INTRAVENOUS

## 2017-03-06 MED ORDER — PHENYLEPHRINE 40 MCG/ML (10ML) SYRINGE FOR IV PUSH (FOR BLOOD PRESSURE SUPPORT)
80.0000 ug | PREFILLED_SYRINGE | INTRAVENOUS | Status: DC | PRN
  Filled 2017-03-06: qty 5

## 2017-03-06 MED ORDER — ACETAMINOPHEN 325 MG PO TABS: 650.0000 mg | ORAL_TABLET | ORAL | Status: DC | PRN

## 2017-03-06 MED ORDER — SOD CITRATE-CITRIC ACID 500-334 MG/5ML PO SOLN
30.0000 mL | ORAL | Status: DC | PRN
  Administered 2017-03-06: 30 mL via ORAL
  Filled 2017-03-06: qty 15

## 2017-03-06 MED ORDER — PENICILLIN G POT IN DEXTROSE 60000 UNIT/ML IV SOLN
3.0000 10*6.[IU] | INTRAVENOUS | Status: DC
  Administered 2017-03-07 (×3): 3 10*6.[IU] via INTRAVENOUS
  Filled 2017-03-06 (×7): qty 50

## 2017-03-06 MED ORDER — LACTATED RINGERS IV SOLN
INTRAVENOUS | Status: DC
  Administered 2017-03-06 – 2017-03-07 (×2): via INTRAVENOUS

## 2017-03-06 MED ORDER — OXYTOCIN 40 UNITS IN LACTATED RINGERS INFUSION - SIMPLE MED: 2.5000 [IU]/h | INTRAVENOUS | Status: DC

## 2017-03-06 MED ORDER — FENTANYL 2.5 MCG/ML BUPIVACAINE 1/10 % EPIDURAL INFUSION (WH - ANES)
14.0000 mL/h | INTRAMUSCULAR | Status: DC | PRN
  Administered 2017-03-06: 11.5 mL/h via EPIDURAL
  Administered 2017-03-07: 14 mL/h via EPIDURAL
  Filled 2017-03-06 (×2): qty 100

## 2017-03-06 MED ORDER — OXYCODONE-ACETAMINOPHEN 5-325 MG PO TABS: 2.0000 | ORAL_TABLET | ORAL | Status: DC | PRN

## 2017-03-06 NOTE — H&P (Signed)
OBSTETRIC ADMISSION HISTORY AND PHYSICAL  Elizabeth Warren is a 37 y.o. female [phone] with IUP at [redacted]w[redacted]d by LMP presenting for IOL GHTN and DM2. She reports +FMs, No LOF, no VB, no blurry vision, headaches or peripheral edema, and RUQ pain.  She plans on breast and bottle feeding. She request mirena for birth control. She received her prenatal care at [place]   Dating: By LMP --->  Estimated Date of Delivery: 03/20/17  Sono:  02/27/16  @[redacted]w[redacted]d , CWD, normal anatomy, cephalic presentation, anterior , 3465g, >90% EFW   Clinic [facility] Prenatal Labs  Dating LMP Blood type: A/Positive/-- (07/31 0934)   Genetic Screen  AFP: neg   NIPS:mat21:normal  Antibody:Negative (07/31 4540)  Anatomic [place] Normal with bilateral UTD resolved @24  wks Female fetus Rubella: 22.80 (07/31 0934)  GTT Early:               Third trimester: T2DM RPR: Non Reactive (11/08 1110)   Flu vaccine Declined HBsAg: Negative (07/31 0934)   TDaP vaccine Declined                                      Rhogam:n/a A+ HIV: Non Reactive (11/08 1110) NR  Baby Food  Breast/Bottle                                           GBS: positive in urine  Contraception  mirena Pap:09/02/16: negative  Circumcision  yes   Pediatrician   [facility]. CF:Neg  Support Person FOB   Prenatal Classes No  Hgb electrophoresis:neg   Prenatal History/Complications:  Past Medical History: Past Medical History:  Diagnosis Date  . Gestational diabetes   . Hypertension   . NVD (normal vaginal delivery) 09/20/2010  . Pre-existing type 2 diabetes mellitus during pregnancy in second trimester 10/02/2016    Past Surgical History: Past Surgical History:  Procedure Laterality Date  . NO PAST SURGERIES      Obstetrical History: OB History    Gravida Para Term Preterm AB Living   5 4 4  0 0 4   SAB TAB Ectopic Multiple Live Births   0 0 0 0 4      Social History: Social History   Socioeconomic History  . Marital status: Married    Spouse name: Not on file   . Number of children: Not on file  . Years of education: Not on file  . Highest education level: Not on file  Social Needs  . Financial resource strain: Not on file  . Food insecurity - worry: Not on file  . Food insecurity - inability: Not on file  . Transportation needs - medical: Not on file  . Transportation needs - non-medical: Not on file  Occupational History  . Not on file  Tobacco Use  . Smoking status: Never Smoker  . Smokeless tobacco: Never Used  Substance and Sexual Activity  . Alcohol use: No  . Drug use: No  . Sexual activity: Not on file    Comment: pregnant  Other Topics Concern  . Not on file  Social History Narrative  . Not on file    Family History: Family History  Problem Relation Age of Onset  . Diabetes Mother   . Hypertension Mother   . Diabetes Father   .  Hypertension Father   . Diabetes Maternal Grandmother   . Hypertension Maternal Grandmother   . Diabetes Maternal Grandfather   . Hypertension Maternal Grandfather   . Diabetes Paternal Grandmother   . Hypertension Paternal Grandmother   . Diabetes Paternal Grandfather   . Hypertension Paternal Grandfather   . Cancer Neg Hx   . Heart disease Neg Hx   . Stroke Neg Hx     Allergies: No Known Allergies  Medications Prior to Admission  Medication Sig Dispense Refill Last Dose  . ACCU-CHEK FASTCLIX LANCETS MISC 1 each by Does not apply route 4 (four) times daily. 100 each 9 Taking  . acetaminophen (TYLENOL) 500 MG tablet Take 500 mg by mouth every 6 (six) hours as needed for moderate pain.   Taking  . aspirin 81 MG chewable tablet Chew 1 tablet (81 mg total) by mouth daily. 30 tablet 12 Taking  . calcium carbonate (TUMS - DOSED IN MG ELEMENTAL CALCIUM) 500 MG chewable tablet Chew 1 tablet by mouth 2 (two) times daily as needed for indigestion or heartburn.   Taking  . cyclobenzaprine (FLEXERIL) 10 MG tablet Take 1 tablet (10 mg total) by mouth every 8 (eight) hours as needed for muscle  spasms. 30 tablet 1 Taking  . glucose blood (ACCU-CHEK GUIDE) test strip Use 1 test strip to check blood glucose 4 times daily 100 each 9 Taking  . glyBURIDE (DIABETA) 2.5 MG tablet Take 1 tablet (2.5 mg total) by mouth 2 (two) times daily with a meal. 60 tablet 3 Taking  . glyBURIDE (DIABETA) 2.5 MG tablet Take 2 tablets with breakfast and 1 tablet at bedtime 90 tablet 4 Taking  . insulin NPH Human (HUMULIN N,NOVOLIN N) 100 UNIT/ML injection Inject 38 units before breakfast. Inject 14 units at bedtime with snack. 10 mL 5 Taking  . insulin regular (NOVOLIN R,HUMULIN R) 100 units/mL injection Inject 19 units before breakfast. Inject 14 units at bedtime with snack. 10 mL 5 Taking  . Insulin Syringe 27G X 1/2" 1 ML MISC 1 each by Does not apply route 4 (four) times daily. 100 each 5 Taking  . Prenat-FeFum-FePo-FA-DHA w/o A (PROVIDA DHA) 16-16-1.25-110 MG CAPS Take 1 tablet by mouth daily. 30 capsule 12 Taking  . Vitamin D, Ergocalciferol, (DRISDOL) 50000 units CAPS capsule Take 1 capsule (50,000 Units total) by mouth every 7 (seven) days. tuesday 14 capsule 5 Taking     Review of Systems   All systems reviewed and negative except as stated in HPI  Last menstrual period 12/21/2015. General appearance: alert, cooperative and appears stated age Lungs: clear to auscultation bilaterally Heart: regular rate and rhythm Abdomen: soft, non-tender; bowel sounds normal Pelvic: 1/50/-3 Extremities: Homans sign is negative, no sign of DVT DTR's intact  Presentation: cephalic Fetal monitoringBaseline: 145 bpm Uterine activityNone     Prenatal labs: ABO, Rh: A/Positive/-- (07/31 [id number]) Antibody: Negative (07/31 0934) Rubella: 22.80 (07/31 0934) RPR: Non Reactive (11/08 1110)  HBsAg: Negative (07/31 0934)  HIV: Non Reactive (11/08 1110)  GBS:   pos 1 hr Glucola DM 2 Genetic screening  normal Anatomy US Normal with bilateral UTD resolved @24  wks Female fetus  Prenatal Transfer Tool  Maternal  Diabetes: Yes:  Diabetes Type:  Insulin/Medication controlled Genetic Screening: Normal Maternal Ultrasounds/Referrals: Normal Fetal Ultrasounds or other Referrals:  None Maternal Substance Abuse:  No Significant Maternal Medications:  None Significant Maternal Lab Results: None  No results found for this or any previous visit (from the past 24 hour(s)).  Patient Active Problem List   Diagnosis Date Noted  . Gestational HTN 03/06/2017  . Indication for care in labor or delivery 03/06/2017  . Upper respiratory infection 01/30/2017  . GBS bacteriuria 10/13/2016  . Pre-existing type 2 diabetes mellitus during pregnancy in second trimester 10/02/2016  . Elevated hemoglobin A1c 09/06/2016  . Low vitamin D level 09/03/2016  . Supervision of high risk pregnancy, antepartum 09/02/2016  . Obesity (BMI 30-39.9) 10/31/2011    Assessment/Plan:  Elizabeth Warren is a 37 y.o. [id number] at [redacted]w[redacted]d here for IOL for gHTN. Q4hr POC glucose DM2 on insulin held for NPO  #Labor:IOL will start with cytotec, CMP, urine protein/Cr pending #Pain: At patient request #FWB: Category 1 #ID:  PCN  #MOF: both #MOC:mirena  #Circ:  yes  Nigel Bridgeman, MD  03/06/2017, 4:58 PM

## 2017-03-06 NOTE — Progress Notes (Signed)
LABOR PROGRESS NOTE  Elizabeth Warren is a 37 y.o. [id number] at 616w0d  admitted for IOL for GHTN.   Subjective: Pt doing well. Feeling contractions, and would like epidural  Objective: BP 138/88   Pulse (!) 103   Temp 98.3 F (36.8 C) (Oral)   Resp 16   Ht 5' (1.524 m)   Wt 193 lb 11.2 oz (87.9 kg)   LMP 12/21/2015   SpO2 100%   BMI 37.83 kg/m  or  Vitals:   03/06/17 1915 03/06/17 2112 03/06/17 2228 03/06/17 2354  BP: 125/84 117/90 125/85 138/88  Pulse: (!) 123 (!) 114 (!) 115 (!) 103  Resp: 16 18 16    Temp:      TempSrc:      SpO2:    100%  Weight:      Height:        Last SVE Dilation: 5 Effacement (%): 70 Cervical Position: Posterior Station: -3 Presentation: Vertex Exam by:: savannah brendle RN FHT: baseline rate 145, moderate varibility, +acel, no decel Toco: ctx q3-4 min  Assessment / Plan: 37 y.o. [id number] at 178w0d here for IOL for gestational HTN. Bps normal to mild range  Labor: FB now out. Last cytotec dose at 22:25. Plan to recheck around 0230 and if not making cervical change, start Piocin Fetal Wellbeing:  Cat I Pain Control:  Pt will get epidural Anticipated MOD:  SVD  Frederik PearJulie P Degele, MD 03/06/2017

## 2017-03-06 NOTE — Progress Notes (Signed)
   PRENATAL VISIT NOTE  Subjective:  Elizabeth Warren is a 37 y.o. [id number] at [redacted]w[redacted]d being seen today for ongoing prenatal care.  She is currently monitored for the following issues for this high-risk pregnancy and has Obesity (BMI 30-39.9); Supervision of high risk pregnancy, antepartum; Low vitamin D level; Elevated hemoglobin A1c; Pre-existing type 2 diabetes mellitus during pregnancy in second trimester; GBS bacteriuria; Upper respiratory infection; and Gestational HTN on their problem list.  Patient reports occasional contractions.  Contractions: Not present. Vag. Bleeding: None.  Movement: Present. Denies leaking of fluid.   T2DM on insulin, patient not taking insulin as prescribed and has been self decreased doses, currently prescribed for, she is taking 14 in am NPH 34 in am, 10 units at night Regular 12 units in am, 10 units at night FG: 60-80s PP: up to 170s if she doesn't follow her diet, 100s if she follows her diet  The following portions of the patient's history were reviewed and updated as appropriate: allergies, current medications, past family history, past medical history, past social history, past surgical history and problem list. Problem list updated.  Objective:   Vitals:   03/06/17 0823  BP: (!) 135/91  Pulse: (!) 108  Weight: 196 lb 12.8 oz (89.3 kg)    Fetal Status: Fetal Heart Rate (bpm): NST   Movement: Present     General:  Alert, oriented and cooperative. Patient is in no acute distress.  Skin: Skin is warm and dry. No rash noted.   Cardiovascular: Normal heart rate noted  Respiratory: Normal respiratory effort, no problems with respiration noted  Abdomen: Soft, gravid, appropriate for gestational age.  Pain/Pressure: Present     Pelvic: Cervical exam deferred        Extremities: Normal range of motion.  Edema: None  Mental Status:  Normal mood and affect. Normal behavior. Normal judgment and thought content.   Assessment and Plan:  Pregnancy: G5P4004  at [redacted]w[redacted]d  1. Pre-existing type 2 diabetes mellitus during pregnancy in second trimester BPP reassuring yesterday - Fetal nonstress test  On insulin NPH34/10 and regular 12/10 CBGs not well controlled, patient reports elevated sugars when she does not follow her diet NST today reactive  2. Supervision of high risk pregnancy, antepartum  3. Obesity (BMI 30-39.9)  4. GBS bacteriuria ppx in labor  5. Gestational hypertension, antepartum Will be scheduled for IOL for 03/08/17 for new onset gHTN   Term labor symptoms and general obstetric precautions including but not limited to vaginal bleeding, contractions, leaking of fluid and fetal movement were reviewed in detail with the patient. Please refer to After Visit Summary for other counseling recommendations.  Return in about 5 weeks (around 04/10/2017) for post partum check.   Conan BowensKelly M Jiselle Sheu, MD

## 2017-03-06 NOTE — Telephone Encounter (Signed)
Preadmission screen [phone] interpreter number

## 2017-03-06 NOTE — Progress Notes (Signed)
Subjective: Elizabeth Warren is a 37 y.o. [name] at [redacted]w[redacted]d by LMP admitted for induction of labor due to Insulin dependent diabetes and Hypertension.  Objective: BP (!) 141/93   Pulse (!) 115   Temp 98.3 F (36.8 C) (Oral)   Resp 18   Ht 5' (1.524 m)   Wt 193 lb 11.2 oz (87.9 kg)   LMP 12/21/2015   BMI 37.83 kg/m  No intake/output data recorded. No intake/output data recorded.  FHT:  FHR: 140 bpm, variability: moderate,  accelerations:  Present,  decelerations:  Absent UC:   irregular, every 2-8 minutes SVE:   Dilation: 1.5 Effacement (%): 50 Station: -2 Exam by:: Carloyn Jaeger, CNM Cervical Balloon placed without difficulty - 60 ml instilled by RN/ pt tolerated well  Labs: Lab Results  Component Value Date   WBC 12.0 (H) 03/06/2017   HGB 11.7 (L) 03/06/2017   HCT 34.9 (L) 03/06/2017   MCV 77.0 (L) 03/06/2017   PLT 344 03/06/2017   Results for orders placed or performed during the hospital encounter of 03/06/17 (from the past 24 hour(s))  CBC     Status: Abnormal   Collection Time: 03/06/17  4:47 PM  Result Value Ref Range   WBC 12.0 (H) 4.0 - 10.5 K/uL   RBC 4.53 3.87 - 5.11 MIL/uL   Hemoglobin 11.7 (L) 12.0 - 15.0 g/dL   HCT 29.5 (L) 62.1 - 30.8 %   MCV 77.0 (L) 78.0 - 100.0 fL   MCH 25.8 (L) 26.0 - 34.0 pg   MCHC 33.5 30.0 - 36.0 g/dL   RDW 65.7 (H) 84.6 - 96.2 %   Platelets 344 150 - 400 K/uL  Comprehensive metabolic panel     Status: Abnormal   Collection Time: 03/06/17  4:47 PM  Result Value Ref Range   Sodium 132 (L) 135 - 145 mmol/L   Potassium 3.7 3.5 - 5.1 mmol/L   Chloride 101 101 - 111 mmol/L   CO2 22 22 - 32 mmol/L   Glucose, Bld 63 (L) 65 - 99 mg/dL   BUN 9 6 - 20 mg/dL   Creatinine, Ser 9.52 0.44 - 1.00 mg/dL   Calcium 9.3 8.9 - 84.1 mg/dL   Total Protein 7.1 6.5 - 8.1 g/dL   Albumin 2.9 (L) 3.5 - 5.0 g/dL   AST 23 15 - 41 U/L   ALT 25 14 - 54 U/L   Alkaline Phosphatase 132 (H) 38 - 126 U/L   Total Bilirubin 0.1 (L) 0.3 - 1.2 mg/dL   GFR calc  non Af Amer >60 >60 mL/min   GFR calc Af Amer >60 >60 mL/min   Anion gap 9 5 - 15  Type and screen [facility]     Status: None   Collection Time: 03/06/17  5:13 PM  Result Value Ref Range   ABO/RH(D) A POS    Antibody Screen NEG    Sample Expiration 03/09/2017   Glucose, capillary     Status: None   Collection Time: 03/06/17  5:44 PM  Result Value Ref Range   Glucose-Capillary 70 65 - 99 mg/dL   Comment 1 Notify RN    Comment 2 Document in Chart      Assessment / Plan: Induction of labor due to gestational hypertension and IDDM,  progressing well on pitocin  Labor: IOL Preeclampsia:  labs stable Fetal Wellbeing:  Category I Pain Control:  Labor support without medications I/D:  n/a Anticipated MOD:  NSVD  Raelyn Mora,  MSN, CNM 03/06/2017, 6:34 PM

## 2017-03-07 ENCOUNTER — Encounter (HOSPITAL_COMMUNITY): Payer: Self-pay | Admitting: Anesthesiology

## 2017-03-07 DIAGNOSIS — Z3A38 38 weeks gestation of pregnancy: Secondary | ICD-10-CM

## 2017-03-07 DIAGNOSIS — O134 Gestational [pregnancy-induced] hypertension without significant proteinuria, complicating childbirth: Secondary | ICD-10-CM

## 2017-03-07 DIAGNOSIS — O2412 Pre-existing diabetes mellitus, type 2, in childbirth: Secondary | ICD-10-CM

## 2017-03-07 LAB — CBC
HEMATOCRIT: 35.6 % — AB (ref 36.0–46.0)
Hemoglobin: 11.9 g/dL — ABNORMAL LOW (ref 12.0–15.0)
MCH: 25.8 pg — AB (ref 26.0–34.0)
MCHC: 33.4 g/dL (ref 30.0–36.0)
MCV: 77.2 fL — AB (ref 78.0–100.0)
Platelets: 335 10*3/uL (ref 150–400)
RBC: 4.61 MIL/uL (ref 3.87–5.11)
RDW: 16.3 % — AB (ref 11.5–15.5)
WBC: 22.9 10*3/uL — ABNORMAL HIGH (ref 4.0–10.5)

## 2017-03-07 LAB — GLUCOSE, CAPILLARY
GLUCOSE-CAPILLARY: 102 mg/dL — AB (ref 65–99)
Glucose-Capillary: 80 mg/dL (ref 65–99)
Glucose-Capillary: 98 mg/dL (ref 65–99)
Glucose-Capillary: 84 mg/dL (ref 65–99)
Glucose-Capillary: 227 mg/dL — ABNORMAL HIGH (ref 65–99)

## 2017-03-07 LAB — RPR: RPR Ser Ql: NONREACTIVE

## 2017-03-07 MED ORDER — SIMETHICONE 80 MG PO CHEW
80.0000 mg | CHEWABLE_TABLET | ORAL | Status: DC | PRN
Start: 2017-03-07 — End: 2017-03-09
  Filled 2017-03-07: qty 1

## 2017-03-07 MED ORDER — WITCH HAZEL-GLYCERIN EX PADS: 1.0000 "application " | MEDICATED_PAD | CUTANEOUS | Status: DC | PRN

## 2017-03-07 MED ORDER — COCONUT OIL OIL: 1.0000 "application " | TOPICAL_OIL | Status: DC | PRN

## 2017-03-07 MED ORDER — METFORMIN HCL ER 500 MG PO TB24
500.0000 mg | ORAL_TABLET | Freq: Two times a day (BID) | ORAL | Status: DC
  Administered 2017-03-07 – 2017-03-09 (×4): 500 mg via ORAL
  Filled 2017-03-07 (×4): qty 1

## 2017-03-07 MED ORDER — OXYTOCIN 40 UNITS IN LACTATED RINGERS INFUSION - SIMPLE MED
1.0000 m[IU]/min | INTRAVENOUS | Status: DC
  Administered 2017-03-07: 2 m[IU]/min via INTRAVENOUS

## 2017-03-07 MED ORDER — TETANUS-DIPHTH-ACELL PERTUSSIS 5-2.5-18.5 LF-MCG/0.5 IM SUSP: 0.5000 mL | Freq: Once | INTRAMUSCULAR | Status: DC

## 2017-03-07 MED ORDER — LIDOCAINE HCL (PF) 1 % IJ SOLN
INTRAMUSCULAR | Status: DC | PRN
  Administered 2017-03-06 (×2): 4 mL via EPIDURAL

## 2017-03-07 MED ORDER — LACTATED RINGERS IV SOLN
500.0000 mL | Freq: Once | INTRAVENOUS | Status: AC
  Administered 2017-03-07: 500 mL via INTRAVENOUS

## 2017-03-07 MED ORDER — IBUPROFEN 600 MG PO TABS
600.0000 mg | ORAL_TABLET | Freq: Four times a day (QID) | ORAL | Status: DC
  Administered 2017-03-07 – 2017-03-09 (×6): 600 mg via ORAL
  Filled 2017-03-07 (×7): qty 1

## 2017-03-07 MED ORDER — TERBUTALINE SULFATE 1 MG/ML IJ SOLN
0.2500 mg | Freq: Once | INTRAMUSCULAR | Status: DC | PRN
  Filled 2017-03-07: qty 1

## 2017-03-07 MED ORDER — OXYCODONE-ACETAMINOPHEN 5-325 MG PO TABS
1.0000 | ORAL_TABLET | ORAL | Status: DC | PRN
  Administered 2017-03-07: 1 via ORAL
  Filled 2017-03-07: qty 1

## 2017-03-07 MED ORDER — DIPHENHYDRAMINE HCL 25 MG PO CAPS: 25.0000 mg | ORAL_CAPSULE | Freq: Four times a day (QID) | ORAL | Status: DC | PRN

## 2017-03-07 MED ORDER — PRENATAL MULTIVITAMIN CH
1.0000 | ORAL_TABLET | Freq: Every day | ORAL | Status: DC
  Filled 2017-03-07: qty 1

## 2017-03-07 MED ORDER — ONDANSETRON HCL 4 MG/2ML IJ SOLN: 4.0000 mg | INTRAMUSCULAR | Status: DC | PRN

## 2017-03-07 MED ORDER — SODIUM BICARBONATE 8.4 % IV SOLN
INTRAVENOUS | Status: DC | PRN
  Administered 2017-03-07: 5 mL via EPIDURAL

## 2017-03-07 MED ORDER — OXYTOCIN 40 UNITS IN LACTATED RINGERS INFUSION - SIMPLE MED
1.0000 m[IU]/min | INTRAVENOUS | Status: DC
  Filled 2017-03-07: qty 1000

## 2017-03-07 MED ORDER — ACETAMINOPHEN 325 MG PO TABS
650.0000 mg | ORAL_TABLET | ORAL | Status: DC | PRN
  Administered 2017-03-07: 650 mg via ORAL
  Filled 2017-03-07: qty 2

## 2017-03-07 MED ORDER — GLYBURIDE 2.5 MG PO TABS
2.5000 mg | ORAL_TABLET | Freq: Two times a day (BID) | ORAL | Status: DC
  Filled 2017-03-07 (×2): qty 1

## 2017-03-07 MED ORDER — OXYCODONE HCL 5 MG PO TABS
10.0000 mg | ORAL_TABLET | ORAL | Status: DC | PRN
  Administered 2017-03-08 – 2017-03-09 (×6): 10 mg via ORAL
  Filled 2017-03-07 (×7): qty 2

## 2017-03-07 MED ORDER — ONDANSETRON HCL 4 MG PO TABS: 4.0000 mg | ORAL_TABLET | ORAL | Status: DC | PRN

## 2017-03-07 MED ORDER — DIBUCAINE 1 % RE OINT: 1.0000 "application " | TOPICAL_OINTMENT | RECTAL | Status: DC | PRN

## 2017-03-07 MED ORDER — BENZOCAINE-MENTHOL 20-0.5 % EX AERO
1.0000 "application " | INHALATION_SPRAY | CUTANEOUS | Status: DC | PRN
  Administered 2017-03-07 – 2017-03-09 (×2): 1 via TOPICAL
  Filled 2017-03-07 (×2): qty 56

## 2017-03-07 MED ORDER — SENNOSIDES-DOCUSATE SODIUM 8.6-50 MG PO TABS
2.0000 | ORAL_TABLET | ORAL | Status: DC
  Administered 2017-03-07 – 2017-03-08 (×2): 2 via ORAL
  Filled 2017-03-07 (×2): qty 2

## 2017-03-07 MED ORDER — OXYCODONE-ACETAMINOPHEN 5-325 MG PO TABS
1.0000 | ORAL_TABLET | Freq: Once | ORAL | Status: AC | PRN
  Administered 2017-03-07: 1 via ORAL
  Filled 2017-03-07: qty 1

## 2017-03-07 NOTE — Progress Notes (Signed)
   03/07/17 1637  Vital Signs  BP 139/81  BP Location Left Arm  Patient Position (if appropriate) Lying  BP Method Automatic  Pulse Rate (!) 137   Medical student K Mullis notified of new pulse rate of 137. Also let her know about pain 10/10. Stated she will discuss with Dr. Darin EngelsAbraham.

## 2017-03-07 NOTE — Anesthesia Pain Management Evaluation Note (Signed)
  CRNA Pain Management Visit Note  Patient: Elizabeth Warren, 37 y.o., female  "Hello I am a member of the anesthesia team at [facility]. We have an anesthesia team available at all times to provide care throughout the hospital, including epidural management and anesthesia for C-section. I don't know your plan for the delivery whether it a natural birth, water birth, IV sedation, nitrous supplementation, doula or epidural, but we want to meet your pain goals."   1.Was your pain managed to your expectations on prior hospitalizations?   Yes   2.What is your expectation for pain management during this hospitalization?     Epidural  3.How can we help you reach that goal? Epidural in place at time of visit  Record the patient's initial score and the patient's pain goal.   Pain: 2  Pain Goal: 6 [facility] wants you to be able to say your pain was always managed very well.  Rica RecordsICKELTON,Yamil Oelke 03/07/2017

## 2017-03-07 NOTE — Anesthesia Preprocedure Evaluation (Addendum)
Anesthesia Evaluation  Patient identified by MRN, date of birth, ID band Patient awake    Reviewed: Allergy & Precautions, Patient's Chart, lab work & pertinent test results  Airway Mallampati: III  TM Distance: >3 FB Neck ROM: Full    Dental no notable dental hx. (+) Teeth Intact   Pulmonary neg pulmonary ROS,    Pulmonary exam normal breath sounds clear to auscultation       Cardiovascular hypertension, Pt. on medications Normal cardiovascular exam Rhythm:Regular Rate:Normal     Neuro/Psych negative neurological ROS  negative psych ROS   GI/Hepatic Neg liver ROS, GERD  ,  Endo/Other  diabetes, Well Controlled, Gestational, Insulin DependentObesity  Renal/GU negative Renal ROS  negative genitourinary   Musculoskeletal   Abdominal (+) + obese,   Peds  Hematology  (+) Sickle cell trait ,   Anesthesia Other Findings   Reproductive/Obstetrics (+) Pregnancy PIH                              Anesthesia Physical Anesthesia Plan  ASA: III  Anesthesia Plan: Epidural   Post-op Pain Management:    Induction:   PONV Risk Score and Plan:   Airway Management Planned: Natural Airway  Additional Equipment:   Intra-op Plan:   Post-operative Plan:   Informed Consent: I have reviewed the patients History and Physical, chart, labs and discussed the procedure including the risks, benefits and alternatives for the proposed anesthesia with the patient or authorized representative who has indicated his/her understanding and acceptance.     Plan Discussed with: Anesthesiologist  Anesthesia Plan Comments:         Anesthesia Quick Evaluation

## 2017-03-07 NOTE — Progress Notes (Signed)
Offered interpretive services on admission, and patient refused. Then brought [employer] interpretive services into room for Arabic interpretation at 1630. Pt refused its use and stated she could speak [place].

## 2017-03-07 NOTE — Progress Notes (Signed)
Rn spent an 1.5 hours in room teaching, changing diapers. Gave pain medicine, and performing comfort measures.  Rn called Doctor regarding stronger pain medicine requested by mom.

## 2017-03-07 NOTE — Progress Notes (Signed)
Patient ID: Elizabeth Warren, female   DOB: 02/01/1981, 37 y.o.   MRN: [id number]  Feeling abd pain w/ ctx; no rectal pressure  BP 142/95, P 109 FHR 150s, early variables w/ each ctx Ctx Q 2-3 mins w/ Pit @ 554mu/min Cx C/C/vtx 0 to +1  IUP@term  gHTN End 1st stage  Pt not pushing effectively; will labor down until the urge to push is stronger Anticipate SVD  Clelia CroftSHAW, Elizabeth Warren 03/07/2017

## 2017-03-07 NOTE — Progress Notes (Signed)
Elizabeth Warren is a 37 y.o. [id number] at 6228w1d admitted for induction of labor due to Hypertension.  Subjective: Patient doing well s/p cytotec and fb. Pitocin started at 8 am. AROM. Epidural in place and pain well controlled. Blood glucose normal and BP normal .   Objective: BP 125/82   Pulse (!) 112   Temp 99 F (37.2 C) (Oral)   Resp 17   Ht 5' (1.524 m)   Wt 193 lb 11.2 oz (87.9 kg)   LMP 12/21/2015   SpO2 99%   BMI 37.83 kg/m  No intake/output data recorded. Total I/O In: -  Out: 825 [Urine:825]  FHT:  FHR: 145 bpm, variability: moderate,  accelerations:  Present,  decelerations:  Absent UC:   irregular, every 2-3 minutes SVE:   Dilation: 7 Effacement (%): 80, 90 Station: -2 Exam by:: Craige Cottaamanda Loye, RNC  Labs: Lab Results  Component Value Date   WBC 10.5 03/06/2017   HGB 10.4 (L) 03/06/2017   HCT 31.5 (L) 03/06/2017   MCV 77.6 (L) 03/06/2017   PLT 294 03/06/2017    Assessment / Plan: Induction of labor due to gestational hypertension,  progressing well on pitocin  Labor: Progressing on Pitocin, will continue to increase then AROM and S/p cytotec, FB and AROM pitocin stated at 8 am  Preeclampsia:  no signs or symptoms of toxicity Fetal Wellbeing:  Category I Pain Control:  Epidural I/D:  PCN Anticipated MOD:  NSVD  Kaliq Lege 03/07/2017, 9:58 AM

## 2017-03-07 NOTE — Progress Notes (Signed)
   03/07/17 1416 03/07/17 1431 03/07/17 1540  Vital Signs  BP (!) 133/92 (!) 141/84 133/86  BP Location --  --  Left Arm  Patient Position (if appropriate) --  --  Lying  BP Method --  --  Automatic  Pulse Rate (!) 122 (!) 115 (!) 122   Called resident, Dr. Darin EngelsAbraham, about current blood pressure of 133/86 and pulse of 122, as well as brought attention to past elevated blood pressures and pulse rates. Dr. Darin EngelsAbraham stated to continue watching the values and to notify them of other abnormal values.

## 2017-03-07 NOTE — Anesthesia Procedure Notes (Signed)
Epidural Patient location during procedure: OB Start time: 03/06/2017 11:54 PM  Staffing Anesthesiologist: Mal AmabileFoster, Keyna Blizard, MD Performed: anesthesiologist   Preanesthetic Checklist Completed: patient identified, site marked, surgical consent, pre-op evaluation, timeout performed, IV checked, risks and benefits discussed and monitors and equipment checked  Epidural Patient position: sitting Prep: site prepped and draped and DuraPrep Patient monitoring: continuous pulse ox and blood pressure Approach: midline Location: L3-L4 Injection technique: LOR air  Needle:  Needle type: Tuohy  Needle gauge: 17 G Needle length: 9 cm and 9 Needle insertion depth: 5 cm cm Catheter type: closed end flexible Catheter size: 19 Gauge Catheter at skin depth: 10 cm Test dose: negative and Other  Assessment Events: blood not aspirated, injection not painful, no injection resistance, negative IV test and no paresthesia  Additional Notes Patient identified. Risks and benefits discussed including failed block, incomplete  Pain control, post dural puncture headache, nerve damage, paralysis, blood pressure Changes, nausea, vomiting, reactions to medications-both toxic and allergic and post Partum back pain. All questions were answered. Patient expressed understanding and wished to proceed. Sterile technique was used throughout procedure. Epidural site was Dressed with sterile barrier dressing. No paresthesias, signs of intravascular injection Or signs of intrathecal spread were encountered.  Patient was more comfortable after the epidural was dosed. Please see RN's note for documentation of vital signs and FHR which are stable.

## 2017-03-07 NOTE — Anesthesia Postprocedure Evaluation (Signed)
Anesthesia Post Note  Patient: Elizabeth Warren  Procedure(s) Performed: AN AD HOC LABOR EPIDURAL     Patient location during evaluation: Mother Baby Anesthesia Type: Epidural Level of consciousness: awake and alert and oriented Pain management: satisfactory to patient Vital Signs Assessment: post-procedure vital signs reviewed and stable Respiratory status: spontaneous breathing and nonlabored ventilation Cardiovascular status: stable Postop Assessment: no headache, no backache, no signs of nausea or vomiting, adequate PO intake and patient able to bend at knees (patient up walking) Anesthetic complications: no    Last Vitals:  Vitals:   03/07/17 1540 03/07/17 1637  BP: 133/86 139/81  Pulse: (!) 122 (!) 137  Resp: 18 18  Temp: 36.9 C (!) 36.4 C  SpO2: 99% 100%    Last Pain:  Vitals:   03/07/17 1735  TempSrc:   PainSc: 10-Worst pain ever   Pain Goal: Patients Stated Pain Goal: ("pain is better, no need to call doctor at this time") (03/07/17 0950)               Madison HickmanGREGORY,Antonio Woodhams

## 2017-03-07 NOTE — Progress Notes (Signed)
LABOR PROGRESS NOTE  Elizabeth Warren is a 37 y.o. [id number] at 2620w0d  admitted for IOL for GHTN.   Subjective: Pt doing well. Pain is well-controlled with epidural  Objective: BP 117/76   Pulse 90   Temp 98.1 F (36.7 C) (Oral)   Resp 18   Ht 5' (1.524 m)   Wt 193 lb 11.2 oz (87.9 kg)   LMP 12/21/2015   SpO2 99%   BMI 37.83 kg/m  or  Vitals:   03/07/17 0036 03/07/17 0101 03/07/17 0131 03/07/17 0201  BP: 124/81 118/84 117/75 117/76  Pulse: 97 95 95 90  Resp: 18 18 16 18   Temp:  98.1 F (36.7 C)    TempSrc:  Oral    SpO2:      Weight:      Height:        SVE: Dilation: 5 Effacement (%): 50 Cervical Position: Posterior Station: -3 Presentation: Vertex Exam by:: Dr. Nira Retortegele FHT: baseline rate 140, moderate varibility, +acel, no decel Toco: ctx q3-4 min  Assessment / Plan: 37 y.o. A5W0981G5P4004 at 1820w0d here for IOL for gestational HTN.  Labor: AROM with clear fluid. Will start IV Pitocin Fetal Wellbeing:  Cat I Pain Control: well-controlled with peidural Anticipated MOD:  SVD  Elizabeth PearJulie P Creasie Lacosse, MD 03/07/2017, 2:31 AM

## 2017-03-08 ENCOUNTER — Inpatient Hospital Stay (HOSPITAL_COMMUNITY): Payer: Medicaid Other

## 2017-03-08 ENCOUNTER — Encounter (HOSPITAL_COMMUNITY): Payer: Self-pay | Admitting: *Deleted

## 2017-03-08 LAB — CBC
HCT: 27.1 % — ABNORMAL LOW (ref 36.0–46.0)
HEMOGLOBIN: 9 g/dL — AB (ref 12.0–15.0)
MCH: 25.9 pg — ABNORMAL LOW (ref 26.0–34.0)
MCHC: 33.6 g/dL (ref 30.0–36.0)
MCV: 77 fL — ABNORMAL LOW (ref 78.0–100.0)
Platelets: 283 10*3/uL (ref 150–400)
RBC: 3.52 MIL/uL — AB (ref 3.87–5.11)
RDW: 16.4 % — ABNORMAL HIGH (ref 11.5–15.5)
WBC: 16.8 10*3/uL — ABNORMAL HIGH (ref 4.0–10.5)

## 2017-03-08 LAB — GLUCOSE, CAPILLARY
GLUCOSE-CAPILLARY: 88 mg/dL (ref 65–99)
Glucose-Capillary: 80 mg/dL (ref 65–99)
Glucose-Capillary: 125 mg/dL — ABNORMAL HIGH (ref 65–99)
Glucose-Capillary: 144 mg/dL — ABNORMAL HIGH (ref 65–99)

## 2017-03-08 NOTE — Progress Notes (Signed)
Dr Rushie Goltzleland discontinued q 4 hours blood sugars for patient. Dr Rushie Goltzleland ordered 0500 am blood sugar for patient. The Dr. Rushie GoltzLeland stated he would reevaluate the patient in the am.

## 2017-03-08 NOTE — Lactation Note (Signed)
This note was copied from a baby's chart. Lactation Consultation Note  Patient Name: Elizabeth Marinell BlightZuhal Sian ZOXWR'UToday's Date: 03/08/2017 Reason for consult: Initial assessment   Initial assessment with Exp BF mom of 28 hour old infant. Spoke with mom via [employer] Abdulhaleem # [phone]. Mom did speak English pretty well.   Mom reports infant did not initially wish to BF, he is doing better now. Infant with 4 BF for 10-20 minutes, BF attempts x 3, formula x 4 via bottle of 2-10 cc, 3 voids, 5 stools and 4 emesis episodes in the last 24 hours. LATCH scores 4-8.   Enc mom to feed infant STS 8-12 x in 24 hours at first feeding cues. Enc mom to offer both breasts with each feeding. Mom reports she feels comfortable with BF and does not need BF assistance as needed.   BF Resources handout and LC Brochure given, mom informed of IP/OP Services, BF Support Groups and LC phone #. Mom is active with WIC. Mom reports she has no questions/concerns at this time.    Maternal Data Has patient been taught Hand Expression?: Yes Does the patient have breastfeeding experience prior to this delivery?: Yes  Feeding Length of feed: 20 min  LATCH Score Latch: Grasps breast easily, tongue down, lips flanged, rhythmical sucking.  Audible Swallowing: A few with stimulation  Type of Nipple: Everted at rest and after stimulation  Comfort (Breast/Nipple): Soft / non-tender  Hold (Positioning): Assistance needed to correctly position infant at breast and maintain latch.  LATCH Score: 8  Interventions Interventions: Breast feeding basics reviewed  Lactation Tools Discussed/Used WIC Program: Yes   Consult Status Consult Status: Follow-up Date: 03/09/17 Follow-up type: In-patient    Silas FloodSharon S Joshuwa Vecchio 03/08/2017, 5:34 PM

## 2017-03-08 NOTE — Progress Notes (Signed)
POSTPARTUM PROGRESS NOTE  Post Partum Day 1  Subjective:  Elizabeth Warren is a 37 y.o. Z6X0960G5P5005 s/p SVD at 5776w1d.  No acute events overnight.  Pt denies problems with ambulating, voiding or po intake.  She denies nausea or vomiting.  Pain is well controlled.    Lochia Small.   Objective: Blood pressure 125/69, pulse 97, temperature 98.4 F (36.9 C), temperature source Oral, resp. rate 18, height 5' (1.524 m), weight 193 lb 11.2 oz (87.9 kg), last menstrual period 12/21/2015, SpO2 100 %, unknown if currently breastfeeding.  Physical Exam:  General: alert, cooperative and no distress Chest: no respiratory distress Heart:regular rate, distal pulses intact Abdomen: soft, nontender,  Uterine Fundus: firm, appropriately tender DVT Evaluation: No calf swelling or tenderness Extremities: no edema Skin: warm, dry  Recent Labs    03/07/17 1432 03/08/17 0512  HGB 11.9* 9.0*  HCT 35.6* 27.1*    Assessment/Plan: Elizabeth Warren is a 37 y.o. A5W0981G5P5005 s/p SVD at 7276w1d   PPD#1 - Doing very well Breast feeding. Baby will be d/c'd tomorrow T2DM: metformin 500 mg BID started. Plan to titrated outpatient and [facility] Dispo: d/c home tomorrow   LOS: 2 days   Kandra NicolasJulie P DegeleMD 03/08/2017, 1:57 PM

## 2017-03-09 LAB — GLUCOSE, CAPILLARY: GLUCOSE-CAPILLARY: 82 mg/dL (ref 65–99)

## 2017-03-09 MED ORDER — OXYCODONE-ACETAMINOPHEN 5-325 MG PO TABS: 1.0000 | ORAL_TABLET | ORAL | 0 refills | Status: DC | PRN

## 2017-03-09 MED ORDER — IBUPROFEN 600 MG PO TABS: 600.0000 mg | ORAL_TABLET | Freq: Four times a day (QID) | ORAL | 0 refills | Status: DC

## 2017-03-09 MED ORDER — SENNOSIDES-DOCUSATE SODIUM 8.6-50 MG PO TABS: 2.0000 | ORAL_TABLET | ORAL | 0 refills | Status: DC

## 2017-03-09 MED ORDER — METFORMIN HCL ER 500 MG PO TB24: 500.0000 mg | ORAL_TABLET | Freq: Two times a day (BID) | ORAL | 0 refills | Status: DC

## 2017-03-09 NOTE — Discharge Instructions (Signed)

## 2017-03-09 NOTE — Discharge Summary (Signed)
OB Discharge Summary     Patient Name: Elizabeth OsmondZuhal B Warren DOB: 01/28/1981 MRN: [id number]  Date of admission: 03/06/2017 Delivering MD: Nigel BridgemanWINBORNE, [place]   Date of discharge: 03/09/2017  Admitting diagnosis: INDUCTION Intrauterine pregnancy: 8854w1d     Secondary diagnosis:  Active Problems:   Obesity (BMI 30-39.9)   Pre-existing type 2 diabetes mellitus during pregnancy in second trimester   GBS bacteriuria   Gestational HTN   Indication for care in labor or delivery   Vaginal delivery  Additional problems: none     Discharge diagnosis: Term Pregnancy Delivered, Gestational Hypertension and Type 2 DM                                                                                                Post partum procedures:none  Augmentation: AROM, Pitocin, Cytotec and Foley Balloon  Complications: None  Hospital course:  Induction of Labor With Vaginal Delivery   37 y.o. yo G5P5005 at 2254w1d was admitted to the hospital 03/06/2017 for induction of labor.  Indication for induction: Gestational hypertension.  Patient had an uncomplicated labor course as follows: Membrane Rupture Time/Date: 2:16 AM ,03/07/2017   Intrapartum Procedures: Episiotomy: None [1]                                         Lacerations:  1st degree [2];Perineal [11]  Patient had delivery of a Viable infant.  Information for the patient's newborn:  Salem CasterMohamed, Boy Berlynn [[id number]][[id number]]  Delivery Method: Vaginal, Spontaneous(Filed from Delivery Summary)   03/07/2017  Details of delivery can be found in separate delivery note.  Patient had a routine postpartum course. Patient is discharged home 03/09/17.  Physical exam  Vitals:   03/08/17 0612 03/08/17 1810 03/09/17 0537 03/09/17 0700  BP: 125/69 131/78 98/63   Pulse: 97 (!) 108 92   Resp: 18 18 18    Temp:  98.1 F (36.7 C) 98.3 F (36.8 C)   TempSrc:  Oral Oral   SpO2:   100%   Weight:    85.7 kg (189 lb)  Height:       General: alert, cooperative and no  distress Lochia: appropriate Uterine Fundus: firm DVT Evaluation: No evidence of DVT seen on physical exam. No cords or calf tenderness. No significant calf/ankle edema. Labs: Lab Results  Component Value Date   WBC 16.8 (H) 03/08/2017   HGB 9.0 (L) 03/08/2017   HCT 27.1 (L) 03/08/2017   MCV 77.0 (L) 03/08/2017   PLT 283 03/08/2017   CMP Latest Ref Rng & Units 03/06/2017  Glucose 65 - 99 mg/dL 78(G63(L)  BUN 6 - 20 mg/dL 9  Creatinine 9.560.44 - 2.131.00 mg/dL 0.860.44  Sodium 578135 - 469145 mmol/L 132(L)  Potassium 3.5 - 5.1 mmol/L 3.7  Chloride 101 - 111 mmol/L 101  CO2 22 - 32 mmol/L 22  Calcium 8.9 - 10.3 mg/dL 9.3  Total Protein 6.5 - 8.1 g/dL 7.1  Total Bilirubin 0.3 - 1.2 mg/dL 6.2(X0.1(L)  Alkaline Phos 38 - 126 U/L  132(H)  AST 15 - 41 U/L 23  ALT 14 - 54 U/L 25    Discharge instruction: per After Visit Summary and "Baby and Me Booklet".  After visit meds:  Allergies as of 03/09/2017   No Known Allergies     Medication List    STOP taking these medications   acetaminophen 500 MG tablet Commonly known as:  TYLENOL   aspirin 81 MG chewable tablet   cyclobenzaprine 10 MG tablet Commonly known as:  FLEXERIL   glyBURIDE 2.5 MG tablet Commonly known as:  DIABETA   insulin NPH Human 100 UNIT/ML injection Commonly known as:  HUMULIN N,NOVOLIN N   insulin regular 100 units/mL injection Commonly known as:  NOVOLIN R,HUMULIN R   Insulin Syringe 27G X 1/2" 1 ML Misc     TAKE these medications   ACCU-CHEK FASTCLIX LANCETS Misc 1 each by Does not apply route 4 (four) times daily.   calcium carbonate 500 MG chewable tablet Commonly known as:  TUMS - dosed in mg elemental calcium Chew 1 tablet by mouth 2 (two) times daily as needed for indigestion or heartburn.   glucose blood test strip Commonly known as:  ACCU-CHEK GUIDE Use 1 test strip to check blood glucose 4 times daily   ibuprofen 600 MG tablet Commonly known as:  ADVIL,MOTRIN Take 1 tablet (600 mg total) by mouth every 6  (six) hours.   metFORMIN 500 MG 24 hr tablet Commonly known as:  GLUCOPHAGE-XR Take 1 tablet (500 mg total) by mouth 2 (two) times daily with a meal.   oxyCODONE-acetaminophen 5-325 MG tablet Commonly known as:  PERCOCET/ROXICET Take 1 tablet by mouth every 4 (four) hours as needed for moderate pain.   PROVIDA DHA 16-16-1.25-110 MG Caps Take 1 tablet by mouth daily.   senna-docusate 8.6-50 MG tablet Commonly known as:  Senokot-S Take 2 tablets by mouth daily. Start taking on:  03/10/2017   Vitamin D (Ergocalciferol) 50000 units Caps capsule Commonly known as:  DRISDOL Take 1 capsule (50,000 Units total) by mouth every 7 (seven) days. tuesday       Diet: carb modified diet  Activity: Advance as tolerated. Pelvic rest for 6 weeks.   Outpatient follow up:1 week Follow up Appt: Future Appointments  Date Time Provider Department Center  04/03/2017  8:15 AM Allie Bossier, MD [facility] None   Follow up Visit:No Follow-up on file. Follow-up Information    [facility]. Schedule an appointment as soon as possible for a visit in 1 week(s).   Specialty:  Obstetrics and Gynecology Why:  Please follow up in 1 week for BP check and then 4 weeks for post partum check Contact information: [address] [place] [address] [phone]          Postpartum contraception: IUD    Newborn Data: Live born female  Birth Weight: 7 lb 8.6 oz (3420 g) APGAR: 9, 9  Newborn Delivery   Birth date/time:  03/07/2017 13:16:00 Delivery type:  Vaginal, Spontaneous     Baby Feeding: Bottle and Breast Disposition:home with mother   03/09/2017 Oralia Manis, DO

## 2017-03-13 ENCOUNTER — Ambulatory Visit: Payer: Medicaid Other

## 2017-03-13 DIAGNOSIS — O24112 Pre-existing diabetes mellitus, type 2, in pregnancy, second trimester: Secondary | ICD-10-CM

## 2017-03-13 DIAGNOSIS — R7989 Other specified abnormal findings of blood chemistry: Secondary | ICD-10-CM

## 2017-03-13 MED ORDER — GLUCOSE BLOOD VI STRP: ORAL_STRIP | 9 refills | Status: DC

## 2017-03-13 MED ORDER — VITAMIN D (ERGOCALCIFEROL) 1.25 MG (50000 UNIT) PO CAPS: 50000.0000 [IU] | ORAL_CAPSULE | ORAL | 5 refills | Status: DC

## 2017-03-13 NOTE — Progress Notes (Signed)
Subjective:  Elizabeth Warren is a 37 y.o. female with hypertension. Current Outpatient Medications  Medication Sig Dispense Refill  . ACCU-CHEK FASTCLIX LANCETS MISC 1 each by Does not apply route 4 (four) times daily. 100 each 9  . calcium carbonate (TUMS - DOSED IN MG ELEMENTAL CALCIUM) 500 MG chewable tablet Chew 1 tablet by mouth 2 (two) times daily as needed for indigestion or heartburn.    Marland Kitchen. glucose blood (ACCU-CHEK GUIDE) test strip Use 1 test strip to check blood glucose 4 times daily 100 each 9  . ibuprofen (ADVIL,MOTRIN) 600 MG tablet Take 1 tablet (600 mg total) by mouth every 6 (six) hours. 30 tablet 0  . metFORMIN (GLUCOPHAGE-XR) 500 MG 24 hr tablet Take 1 tablet (500 mg total) by mouth 2 (two) times daily with a meal. 60 tablet 0  . oxyCODONE-acetaminophen (PERCOCET/ROXICET) 5-325 MG tablet Take 1 tablet by mouth every 4 (four) hours as needed for moderate pain. 10 tablet 0  . Prenat-FeFum-FePo-FA-DHA w/o A (PROVIDA DHA) 16-16-1.25-110 MG CAPS Take 1 tablet by mouth daily. 30 capsule 12  . senna-docusate (SENOKOT-S) 8.6-50 MG tablet Take 2 tablets by mouth daily. 60 tablet 0  . Vitamin D, Ergocalciferol, (DRISDOL) 50000 units CAPS capsule Take 1 capsule (50,000 Units total) by mouth every 7 (seven) days. tuesday 14 capsule 5   No current facility-administered medications for this visit.     Hypertension ROS: taking medications as instructed, no medication side effects noted, no TIA's, no chest pain on exertion, no dyspnea on exertion and no swelling of ankles.  New concerns: headaches.   Objective:  There were no vitals taken for this visit.  Appearance alert, well appearing, and in no distress and oriented to person, place, and time. General exam BP noted to be well controlled today in office.    Assessment:   Hypertension stable.   Plan:  Current treatment plan is effective, no change in therapy..Marland Kitchen

## 2017-03-17 ENCOUNTER — Other Ambulatory Visit: Payer: Self-pay

## 2017-03-17 ENCOUNTER — Encounter: Payer: Self-pay | Admitting: Internal Medicine

## 2017-03-17 ENCOUNTER — Ambulatory Visit (INDEPENDENT_AMBULATORY_CARE_PROVIDER_SITE_OTHER): Payer: Medicaid Other | Admitting: Internal Medicine

## 2017-03-17 VITALS — BP 106/74 | HR 90 | Temp 98.0°F | Wt 178.0 lb

## 2017-03-17 DIAGNOSIS — R7989 Other specified abnormal findings of blood chemistry: Secondary | ICD-10-CM

## 2017-03-17 DIAGNOSIS — R7309 Other abnormal glucose: Secondary | ICD-10-CM

## 2017-03-17 DIAGNOSIS — E559 Vitamin D deficiency, unspecified: Secondary | ICD-10-CM | POA: Diagnosis not present

## 2017-03-17 DIAGNOSIS — Z8632 Personal history of gestational diabetes: Secondary | ICD-10-CM | POA: Diagnosis not present

## 2017-03-17 LAB — GLUCOSE, POCT (MANUAL RESULT ENTRY): POC Glucose: 96 mg/dl (ref 70–99)

## 2017-03-17 NOTE — Patient Instructions (Signed)
Lets stop the Metformin  We will check your Vitamin D today   Follow up with me 3 months

## 2017-03-17 NOTE — Progress Notes (Signed)
   [facility] Phone: [phone]   Date of Visit: 03/17/2017   HPI:  History of Gestational DM:  - patient reports of gestational DM and was put on Glyburide, then switched to Insulin due to intolerance. After delivery she did not need insulin and was told to continue Metformin.  - 90-95 in the morning fasting at home  - her last A1c 5.8 (08/2016) (that we are able to see in chart) - today her post-prandial cbg in clinic was 92.  - she desires to stop Metformin   Vitamin Deficiency:  - currently taking Vitamin D [id number] units weekly - last Vitamin D level 19.1 in July 2018  ROS: See HPI.  PMFSH:  Hx Gestational HTN Hx Gestational DM2  Vitamin D deficiency   PHYSICAL EXAM: BP 106/74   Pulse 90   Temp 98 F (36.7 C) (Oral)   Wt 178 lb (80.7 kg)   SpO2 99%   BMI 34.76 kg/m  GEN: NAD CV: RRR, no murmurs, rubs, or gallops PULM: CTAB, normal effort SKIN: No rash or cyanosis; warm and well-perfused EXTR: No lower extremity edema or calf tenderness PSYCH: Mood and affect euthymic, normal rate and volume of speech NEURO: Awake, alert, no focal deficits grossly, normal speech;  ASSESSMENT/PLAN:  Health maintenance:  - declines Flu vaccine   History of gestational diabetes It seems that patient CBGs have been very well controlled.  Patient would like to do a trial off metformin.  Will discontinue metformin and follow-up in 3 months for repeat A1c.  Low vitamin D level Currently on 50,000 international units weekly.  Will repeat vitamin D level today.  Palma HolterKanishka G Sincere Liuzzi, MD PGY 3 [facility]

## 2017-03-18 ENCOUNTER — Telehealth: Payer: Self-pay | Admitting: Internal Medicine

## 2017-03-18 ENCOUNTER — Encounter: Payer: Self-pay | Admitting: Internal Medicine

## 2017-03-18 LAB — VITAMIN D 25 HYDROXY (VIT D DEFICIENCY, FRACTURES): Vit D, 25-Hydroxy: 41.9 ng/mL (ref 30.0–100.0)

## 2017-03-18 MED ORDER — VITAMIN D3 10 MCG (400 UNIT) PO TABS: 800.0000 [IU] | ORAL_TABLET | Freq: Every day | ORAL | 2 refills | Status: DC

## 2017-03-18 NOTE — Assessment & Plan Note (Signed)
It seems that patient CBGs have been very well controlled.  Patient would like to do a trial off metformin.  Will discontinue metformin and follow-up in 3 months for repeat A1c.

## 2017-03-18 NOTE — Telephone Encounter (Signed)
Attempted to call patient but went to voicemail. Left message to call back.   Her Vitamin D level is great, I would like to switch her from Vit D3 50,000 units weekly to 800IU daily. Will wait until I get in touch with her.

## 2017-03-18 NOTE — Assessment & Plan Note (Signed)
Currently on 50,000 international units weekly.  Will repeat vitamin D level today.

## 2017-03-18 NOTE — Telephone Encounter (Signed)
Pt returning MulgaGunadasa phone call, Would like her to call them back at [phone].

## 2017-03-18 NOTE — Addendum Note (Signed)
Addended by: Palma HolterGUNADASA, KANISHKA G on: 03/18/2017 01:33 PM   Modules accepted: Orders

## 2017-03-18 NOTE — Telephone Encounter (Signed)
Husband informed of vitamin D results and will relay the message to patient.  He confirmed which pharmacy to send medication into.  He said that patient was on her way to the office to get results. Jazmin Hartsell,CMA

## 2017-04-03 ENCOUNTER — Encounter: Payer: Self-pay | Admitting: Obstetrics & Gynecology

## 2017-04-03 ENCOUNTER — Ambulatory Visit (INDEPENDENT_AMBULATORY_CARE_PROVIDER_SITE_OTHER): Payer: Medicaid Other | Admitting: Obstetrics & Gynecology

## 2017-04-03 VITALS — BP 134/98 | HR 99 | Wt 183.6 lb

## 2017-04-03 DIAGNOSIS — Z30430 Encounter for insertion of intrauterine contraceptive device: Secondary | ICD-10-CM

## 2017-04-03 DIAGNOSIS — Z3202 Encounter for pregnancy test, result negative: Secondary | ICD-10-CM

## 2017-04-03 DIAGNOSIS — Z1389 Encounter for screening for other disorder: Secondary | ICD-10-CM | POA: Diagnosis not present

## 2017-04-03 LAB — POCT URINE PREGNANCY: Preg Test, Ur: NEGATIVE

## 2017-04-03 MED ORDER — LEVONORGESTREL 20 MCG/24HR IU IUD
INTRAUTERINE_SYSTEM | Freq: Once | INTRAUTERINE | Status: AC
  Administered 2017-04-03: 09:00:00 via INTRAUTERINE

## 2017-04-03 NOTE — Addendum Note (Signed)
Addended by: Dalphine HandingGARDNER, Memori Sammon L on: 04/03/2017 08:55 AM   Modules accepted: Orders

## 2017-04-03 NOTE — Progress Notes (Signed)
Post Partum Exam  Elizabeth Warren is a 37 y.o. [id number] female who presents for a postpartum visit. She is 4 weeks postpartum following a spontaneous vaginal delivery. I have fully reviewed the prenatal and intrapartum course. The delivery was at 9318w1d gestational weeks.  Anesthesia: epidural. Postpartum course has been unremarkable. Baby's course has been unremakrable. Baby is feeding by both breast and bottle - Enfamil Prosobee. Bleeding no bleeding. Bowel function is normal. Bladder function is normal. Patient is not sexually active. Contraception method is none. Postpartum depression screening:neg  The following portions of the patient's history were reviewed and updated as appropriate: allergies, current medications, past family history, past medical history, past social history, past surgical history and problem list. Last pap smear done 08/2016 and was Normal  Review of Systems Pertinent items are noted in HPI.     Video interpretor used for encounter.  Objective:  unknown if currently breastfeeding.  General:  alert   Breasts:  inspection negative, no nipple discharge or bleeding, no masses or nodularity palpable  Lungs: clear to auscultation bilaterally  Heart:  regular rate and rhythm, S1, S2 normal, no murmur, click, rub or gallop  Abdomen: soft, non-tender; bowel sounds normal; no masses,  no organomegaly   Vulva:  normal  Vagina: normal vagina  Cervix:  anteverted  Corpus: normal  Adnexa:  no mass, fullness, tenderness  Rectal Exam: Not performed.       UPT negative, consent signed, Time out procedure done. A bimanual exam revealed a normal size and shape uterus. Cervix prepped with betadine and grasped with a single tooth tenaculum. Mirena was easily placed and the strings were cut to 3-4 cm. Uterus sounded to 9 cm. She tolerated the procedure well.   Assessment:    Normal postpartum exam. Pap smear not done at today's visit.   Plan:   1. Contraception: IUD   (Mirena) 2. String check in 4 weeks 3. She is seeing her fam med doc regarding her DM

## 2017-04-21 ENCOUNTER — Other Ambulatory Visit: Payer: Medicaid Other

## 2017-04-21 DIAGNOSIS — O99815 Abnormal glucose complicating the puerperium: Secondary | ICD-10-CM

## 2017-04-24 LAB — GLUCOSE TOLERANCE, 2 HOURS
GLUCOSE FASTING GTT: 101 mg/dL — AB (ref 65–99)
GLUCOSE, 2 HOUR: 195 mg/dL — AB (ref 65–139)

## 2017-05-01 ENCOUNTER — Ambulatory Visit: Payer: Medicaid Other | Admitting: Obstetrics and Gynecology

## 2017-05-05 ENCOUNTER — Ambulatory Visit (INDEPENDENT_AMBULATORY_CARE_PROVIDER_SITE_OTHER): Payer: Medicaid Other | Admitting: Certified Nurse Midwife

## 2017-05-05 ENCOUNTER — Encounter: Payer: Self-pay | Admitting: Obstetrics

## 2017-05-05 VITALS — BP 119/82 | HR 82 | Wt 188.8 lb

## 2017-05-05 DIAGNOSIS — Z30431 Encounter for routine checking of intrauterine contraceptive device: Secondary | ICD-10-CM | POA: Diagnosis not present

## 2017-05-05 DIAGNOSIS — E119 Type 2 diabetes mellitus without complications: Secondary | ICD-10-CM

## 2017-05-05 NOTE — Progress Notes (Signed)
Presents for Reliant EnergyString Check.  PP 2Hr. GTT is abnormal.  Patient said her Dr. @ [facility] told her to stop taking her Metformin and stop checking her sugars.

## 2017-05-05 NOTE — Progress Notes (Signed)
Subjective:    Elizabeth Warren who presents for contraception counseling. The patient has no complaints today. The patient is sexually active. Pertinent past medical history: none.  Likes her Mirena IUD.  Is not currently bleeding with the IUD.    The information documented in the HPI was reviewed and verified.  Menstrual History: OB History    Gravida Para Term Preterm AB Living   1 1 1     1    SAB TAB Ectopic Multiple Live Births         0 1      No LMP recorded. has Mirena IUD   Patient Active Problem List   Diagnosis Date Noted   Patient Active Problem List   Diagnosis Date Noted  . Elevated hemoglobin A1c 09/06/2016  . Obesity (BMI 30-39.9) 10/31/2011    No past medical history on file.  Past Surgical History:  Procedure Laterality Date   Past Surgical History:  Procedure Laterality Date  . NO PAST SURGERIES        Current Outpatient Prescriptions:  No medication comments found. Current Outpatient Medications on File Prior to Visit  Medication Sig Dispense Refill  . ACCU-CHEK FASTCLIX LANCETS MISC 1 each by Does not apply route 4 (four) times daily. (Patient not taking: Reported on 05/05/2017) 100 each 9  . Cholecalciferol (VITAMIN D3) 400 units tablet Take 2 tablets (800 Units total) by mouth daily. (Patient not taking: Reported on 05/05/2017) 60 tablet 2  . FENUGREEK PO Take by mouth.    Marland Kitchen. glucose blood (ACCU-CHEK GUIDE) test strip Use 1 test strip to check blood glucose 4 times daily (Patient not taking: Reported on 05/05/2017) 100 each 9  . ibuprofen (ADVIL,MOTRIN) 600 MG tablet Take 1 tablet (600 mg total) by mouth every 6 (six) hours. (Patient not taking: Reported on 04/03/2017) 30 tablet 0  . Prenat-FeFum-FePo-FA-DHA w/o A (PROVIDA DHA) 16-16-1.25-110 MG CAPS Take 1 tablet by mouth daily. (Patient not taking: Reported on 05/05/2017) 30 capsule 12   No current facility-administered medications on file prior to visit.     Allergies  Allergen Reactions  No Known  Allergies   Social History  Substance Use Topics   Social History   Socioeconomic History  . Marital status: Married    Spouse name: Not on file  . Number of children: Not on file  . Years of education: Not on file  . Highest education level: Not on file  Occupational History  . Not on file  Social Needs  . Financial resource strain: Not on file  . Food insecurity:    Worry: Not on file    Inability: Not on file  . Transportation needs:    Medical: Not on file    Non-medical: Not on file  Tobacco Use  . Smoking status: Never Smoker  . Smokeless tobacco: Never Used  Substance and Sexual Activity  . Alcohol use: No  . Drug use: No  . Sexual activity: Not on file    Comment: pregnant  Lifestyle  . Physical activity:    Days per week: Not on file    Minutes per session: Not on file  . Stress: Not on file  Relationships  . Social connections:    Talks on phone: Not on file    Gets together: Not on file    Attends religious service: Not on file    Active member of club or organization: Not on file    Attends meetings of clubs or organizations: Not  on file    Relationship status: Not on file  . Intimate partner violence:    Fear of current or ex partner: Not on file    Emotionally abused: Not on file    Physically abused: Not on file    Forced sexual activity: Not on file  Other Topics Concern  . Not on file  Social History Narrative  . Not on file     No family history on file.     Review of Systems Constitutional: negative for weight loss Genitourinary:negative for abnormal menstrual periods and vaginal discharge   Objective:   BP 115/77   Pulse 89   Wt 161 lb (73 kg)   BMI 25.22 kg/m    General:   alert  Skin:   no rash or abnormalities  Lungs:   clear to auscultation bilaterally  Heart:   regular rate and rhythm, S1, S2 normal, no murmur, click, rub or gallop  Breasts:   deferred  Abdomen:  normal findings: no organomegaly, soft, non-tender  and no hernia  Pelvis:  External genitalia: normal general appearance Urinary system: urethral meatus normal and bladder without fullness, nontender Vaginal: normal without tenderness, induration or masses Cervix: normal appearance, IUD strings present Adnexa: normal bimanual exam Uterus: anteverted and non-tender, normal size   Lab Review Urine pregnancy test Labs reviewed yes Radiologic studies reviewed no  50% of 15 min visit spent on counseling and coordination of care.    Assessment:    37 y.o., continuing IUD, no contraindications.  1. Type 2 diabetes mellitus without complication, without long-term current use of insulin (HCC)      - Ambulatory referral to Endocrinology  Plan:    Endocrinology and PCP management of DM2.   Need to obtain previous records Follow up as needed or in 1 year for annual exam.

## 2017-05-07 NOTE — Progress Notes (Signed)
   [facility] Phone: [phone]   Date of Visit: 05/08/2017   HPI:  Follow up for history of gestational diabetes: - patient had gestational diabets during her most recent pregnancy. She delivered in Jan 2019. She was discharged on Metformin.  - she has no prior history of prediabetes or diabetes - her last A1c in her chart was 5.8  - [facility] 2 hr GTT test done. Her fasting cbg was 101 and 2 hr cbg 195. It was recommended that she go back on her Metformin.  - she comes in today to better understand this discrepancy  - she reports she does have a family history of diabetes  Cough: - reports of dry cough for 2 weeks - she initially had flu like symptoms with temp of 102 about 10 days ago. This has now resolved - she denies rhinorrhea, sore throat, nasal congestion, continued fevers, or shortness of breath  - her symptoms are overall improving.  ROS: See HPI.  PMFSH:  PMH: Obesity  PHYSICAL EXAM: BP 122/74   Pulse 75   Temp 98.2 F (36.8 C) (Oral)   Ht 5' (1.524 m)   Wt 188 lb 12.8 oz (85.6 kg)   SpO2 98%   BMI 36.87 kg/m  GEN: NAD HEENT:neck supple, EOMI, sclera clear, oropharynx normal  CV: RRR, no murmurs, rubs, or gallops PULM: CTAB, normal effort PSYCH: Mood and affect euthymic, normal rate and volume of speech NEURO: Awake, alert, no focal deficits grossly, normal speech   ASSESSMENT/PLAN:  History of gestational diabetes Her A1c today is 5.4. We discussed that currently, she does not have diabetes using the A1c criteria or the 2hr GTT. However, her 2 hr GTT is elevated but not high enough to diagnose with diabetes. We discussed that because of her family history of DM and personal history of GDM, she is at higher risk to develop DM2 in the future. We reviewed the importance of limiting carbohydrate intake and adding regular physical exercise to avoid worsening insulin resistance. We can  recheck A1c in 6 months.   Post-viral cough Syndrome: Vitals stable. Fevers have resolved and symptoms are improving. Most consistent with post viral cough syndrome. Conservative management discussed. Discussed usual course of cough.    Palma HolterKanishka G Gunadasa, MD PGY 3 [facility]

## 2017-05-08 ENCOUNTER — Encounter: Payer: Self-pay | Admitting: Internal Medicine

## 2017-05-08 ENCOUNTER — Ambulatory Visit (INDEPENDENT_AMBULATORY_CARE_PROVIDER_SITE_OTHER): Payer: Medicaid Other | Admitting: Internal Medicine

## 2017-05-08 VITALS — BP 122/74 | HR 75 | Temp 98.2°F | Ht 60.0 in | Wt 188.8 lb

## 2017-05-08 DIAGNOSIS — R7309 Other abnormal glucose: Secondary | ICD-10-CM

## 2017-05-08 DIAGNOSIS — Z8632 Personal history of gestational diabetes: Secondary | ICD-10-CM

## 2017-05-08 DIAGNOSIS — R058 Other specified cough: Secondary | ICD-10-CM

## 2017-05-08 DIAGNOSIS — R05 Cough: Secondary | ICD-10-CM

## 2017-05-08 LAB — POCT GLYCOSYLATED HEMOGLOBIN (HGB A1C): HEMOGLOBIN A1C: 5.4

## 2017-05-08 NOTE — Assessment & Plan Note (Signed)
Her A1c today is 5.4. We discussed that currently, she does not have diabetes using the A1c criteria or the 2hr GTT. However, her 2 hr GTT is elevated but not high enough to diagnose with diabetes. We discussed that because of her family history of DM and personal history of GDM, she is at higher risk to develop DM2 in the future. We reviewed the importance of limiting carbohydrate intake and adding regular physical exercise to avoid worsening insulin resistance. We can recheck A1c in 6 months.

## 2017-05-08 NOTE — Patient Instructions (Signed)
You do not have diabetes. You do not need to take any medications for metformin.     Please follow up in about 6 months to recheck your A1c   Please exercise regularly  Starchy (carb) foods include: Bread, rice, pasta, potatoes, corn, crackers, bagels, muffins, all baked goods.  (Fruits, milk, and yogurt also have carbohydrate, but most of these foods will not spike your blood sugar as the starchy foods will.)  A few fruits do cause high blood sugars; use small portions of bananas (limit to 1/2 at a time), grapes, and most tropical fruits.    Protein foods include: Meat, fish, poultry, eggs, dairy foods, and beans such as pinto and kidney beans (beans also provide carbohydrate).   1. Eat at least 3 meals and 1-2 snacks per day. Never go more than 4-5 hours while awake without eating.  2. Limit starchy foods to TWO per meal and ONE per snack. ONE portion of a starchy  food is equal to the following:   - ONE slice of bread (or its equivalent, such as half of a hamburger bun).   - 1/2 cup of a "scoopable" starchy food such as potatoes or rice.   - 15 grams of carbohydrate as shown on food label.  3. Both lunch and dinner should include a protein food, a carb food, and vegetables.   - Obtain twice as many veg's as protein or carbohydrate foods for both lunch and dinner.   - Fresh or frozen veg's are best.   - Try to keep frozen veg's on hand for a quick vegetable serving.    4. Breakfast should always include protein.

## 2017-09-02 ENCOUNTER — Encounter (HOSPITAL_COMMUNITY): Payer: Self-pay | Admitting: Emergency Medicine

## 2017-09-02 ENCOUNTER — Ambulatory Visit (HOSPITAL_COMMUNITY)
Admission: EM | Admit: 2017-09-02 | Discharge: 2017-09-02 | Disposition: A | Discharge status: Home / Self Care | Payer: Medicaid Other | Attending: Emergency Medicine | Admitting: Emergency Medicine

## 2017-09-02 ENCOUNTER — Other Ambulatory Visit: Payer: Self-pay | Admitting: Obstetrics and Gynecology

## 2017-09-02 DIAGNOSIS — Z8249 Family history of ischemic heart disease and other diseases of the circulatory system: Secondary | ICD-10-CM | POA: Diagnosis not present

## 2017-09-02 DIAGNOSIS — Z760 Encounter for issue of repeat prescription: Secondary | ICD-10-CM

## 2017-09-02 DIAGNOSIS — R102 Pelvic and perineal pain: Secondary | ICD-10-CM | POA: Diagnosis not present

## 2017-09-02 DIAGNOSIS — Z833 Family history of diabetes mellitus: Secondary | ICD-10-CM | POA: Diagnosis not present

## 2017-09-02 DIAGNOSIS — Z79899 Other long term (current) drug therapy: Secondary | ICD-10-CM | POA: Insufficient documentation

## 2017-09-02 DIAGNOSIS — Z30431 Encounter for routine checking of intrauterine contraceptive device: Secondary | ICD-10-CM | POA: Diagnosis not present

## 2017-09-02 DIAGNOSIS — Z3202 Encounter for pregnancy test, result negative: Secondary | ICD-10-CM | POA: Diagnosis not present

## 2017-09-02 DIAGNOSIS — Z131 Encounter for screening for diabetes mellitus: Secondary | ICD-10-CM

## 2017-09-02 DIAGNOSIS — R3915 Urgency of urination: Secondary | ICD-10-CM | POA: Diagnosis present

## 2017-09-02 DIAGNOSIS — Z791 Long term (current) use of non-steroidal anti-inflammatories (NSAID): Secondary | ICD-10-CM | POA: Insufficient documentation

## 2017-09-02 DIAGNOSIS — E119 Type 2 diabetes mellitus without complications: Secondary | ICD-10-CM | POA: Diagnosis not present

## 2017-09-02 DIAGNOSIS — Z975 Presence of (intrauterine) contraceptive device: Secondary | ICD-10-CM | POA: Insufficient documentation

## 2017-09-02 DIAGNOSIS — I10 Essential (primary) hypertension: Secondary | ICD-10-CM | POA: Diagnosis not present

## 2017-09-02 DIAGNOSIS — O0990 Supervision of high risk pregnancy, unspecified, unspecified trimester: Secondary | ICD-10-CM

## 2017-09-02 LAB — POCT URINALYSIS DIP (DEVICE)
BILIRUBIN URINE: NEGATIVE
Glucose, UA: NEGATIVE mg/dL
HGB URINE DIPSTICK: NEGATIVE
KETONES UR: NEGATIVE mg/dL
Leukocytes, UA: NEGATIVE
Nitrite: NEGATIVE
PH: 6.5 (ref 5.0–8.0)
Protein, ur: NEGATIVE mg/dL
SPECIFIC GRAVITY, URINE: 1.025 (ref 1.005–1.030)
Urobilinogen, UA: 0.2 mg/dL (ref 0.0–1.0)

## 2017-09-02 LAB — POCT PREGNANCY, URINE: Preg Test, Ur: NEGATIVE

## 2017-09-02 LAB — GLUCOSE, CAPILLARY: Glucose-Capillary: 98 mg/dL (ref 70–99)

## 2017-09-02 MED ORDER — IBUPROFEN 600 MG PO TABS: 600.0000 mg | ORAL_TABLET | Freq: Four times a day (QID) | ORAL | 0 refills | Status: DC | PRN

## 2017-09-02 NOTE — Discharge Instructions (Addendum)
your IUD seems to be in the right place today. Follow-up with family practice if the ibuprofen 600 mg does not help with your pelvic pain.  You may need an ultrasound to make sure that it is correctly placed.  We will call you if we need to start you on any medications.  Do not have intercourse until you know what your results are, and your symptoms resolve.  Your glucose was normal today.

## 2017-09-02 NOTE — Telephone Encounter (Signed)
Pts daughter called stating that she needs to be ordered a BG meter for her diabetes. I advised pts daughter that since the pt is not pregnant and her diabetes is not being managed by us that she would need to call her PCP or whomever is managing her diabetes for that. Pts daughter verbalized understanding.

## 2017-09-02 NOTE — ED Triage Notes (Signed)
PT reports urinary frequency for 3 days with discomfort.   This is the first month that she hasn't had a menstrual, has had mirena IUD in for four months.   PT has lower abdominal pain as well and is concerned that mirena may have moved.   Would like CBG checked.

## 2017-09-02 NOTE — ED Provider Notes (Signed)
HPI  SUBJECTIVE:  Elizabeth Warren is a 37 y.o. female who presents with 3 days of urinary urgency, frequency, fullness in her bladder.  She reports mild, sharp, intermittent nonradiating, nonmigratory suprapubic pain that lasts hours.  No fevers, nausea, vomiting.  No new back pain.  No vaginal odor, discharge, itching.  No cloudy or odorous urine, hematuria.  She is in a long-term monogamous relationship with her husband who is asymptomatic.  STDs are not a concern today.  She has had similar symptoms before when she has had a UTI.  She also wants her Mirena checked-wants to make sure that it has not moved.  She has been taking ibuprofen 400 to 600 mg with improvement of her symptoms.  No aggravating factors.  Second, patient states that her Accu-Chek glucometer is broken.  She is requesting a new one.  She states that she does not need lancets.  She states that she has not checked her sugar in 3 weeks.  States that it was normal when she was checking it.  She has a past medical history of gestational diabetes, hypertension.  No history of gonorrhea, chlamydia, HSV, HSV, syphilis, Trichomonas, BV, yeast, PID.  LMP: Last month.  She had a IUD placed 4 months ago.  Concerned that she should could be pregnant. 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 Gestational diabetes   . Hypertension   . NVD (normal vaginal delivery) 09/20/2010  . Pre-existing type 2 diabetes mellitus during pregnancy in second trimester 10/02/2016    Past Surgical History:  Procedure Laterality Date  . NO PAST SURGERIES      Family History  Problem Relation Age of Onset  . Diabetes Mother   . Hypertension Mother   . Diabetes Father   . Hypertension Father   . Diabetes Maternal Grandmother   . Hypertension Maternal Grandmother   . Diabetes Maternal Grandfather   . Hypertension Maternal Grandfather   . Diabetes Paternal Grandmother   . Hypertension Paternal Grandmother   . Diabetes Paternal  Grandfather   . Hypertension Paternal Grandfather   . Cancer Neg Hx   . Heart disease Neg Hx   . Stroke Neg Hx     Social History   Tobacco Use  . Smoking status: Never Smoker  . Smokeless tobacco: Never Used  Substance Use Topics  . Alcohol use: No  . Drug use: No    No current facility-administered medications for this encounter.   Current Outpatient Medications:  .  Cholecalciferol (VITAMIN D3) 400 units tablet, Take 2 tablets (800 Units total) by mouth daily., Disp: 60 tablet, Rfl: 2 .  ferrous sulfate 325 (65 FE) MG EC tablet, Take 325 mg by mouth 3 (three) times daily with meals., Disp: , Rfl:  .  ACCU-CHEK FASTCLIX LANCETS MISC, 1 each by Does not apply route 4 (four) times daily. (Patient not taking: Reported on 05/05/2017), Disp: 100 each, Rfl: 9 .  FENUGREEK PO, Take by mouth., Disp: , Rfl:  .  glucose blood (ACCU-CHEK GUIDE) test strip, Use 1 test strip to check blood glucose 4 times daily (Patient not taking: Reported on 05/05/2017), Disp: 100 each, Rfl: 9 .  ibuprofen (ADVIL,MOTRIN) 600 MG tablet, Take 1 tablet (600 mg total) by mouth every 6 (six) hours as needed., Disp: 30 tablet, Rfl: 0  No Known Allergies   ROS  As noted in HPI.   Physical Exam  BP 126/86   Pulse 99   Temp  98.3 F (36.8 C) (Oral)   Resp 16   Wt 200 lb (90.7 kg)   SpO2 99%   BMI 39.06 kg/m   Constitutional: Well developed, well nourished, no acute distress Eyes:  EOMI, conjunctiva normal bilaterally HENT: Normocephalic, atraumatic,mucus membranes moist Respiratory: Normal inspiratory effort Cardiovascular: Normal rate GI: nondistended.  Soft, nontender.  No suprapubic, flank tenderness.  No left lower right lower quadrant tenderness. Back: No CVAT GU: Normal external genitalia.  Parous os.  IUD strings coming from os.  Mucoid discharge from os.  Uterus nontender, smooth.  No CMT, no adnexal tenderness, no adnexal mass.  Chaperone present during exam. skin: No rash, skin  intact Musculoskeletal: no deformities Neurologic: Alert & oriented x 3, no focal neuro deficits Psychiatric: Speech and behavior appropriate   ED Course   Medications - No data to display  Orders Placed This Encounter  Procedures  . Glucose, capillary    Standing Status:   Standing    Number of Occurrences:   1  . POCT urinalysis dip (device)    Standing Status:   Standing    Number of Occurrences:   1  . Pregnancy, urine POC    Standing Status:   Standing    Number of Occurrences:   1  . POCT Glucose (Device for Home Use)    Results for orders placed or performed during the hospital encounter of 09/02/17 (from the past 24 hour(s))  POCT urinalysis dip (device)     Status: None   Collection Time: 09/02/17  8:52 PM  Result Value Ref Range   Glucose, UA NEGATIVE NEGATIVE mg/dL   Bilirubin Urine NEGATIVE NEGATIVE   Ketones, ur NEGATIVE NEGATIVE mg/dL   Specific Gravity, Urine 1.025 1.005 - 1.030   Hgb urine dipstick NEGATIVE NEGATIVE   pH 6.5 5.0 - 8.0   Protein, ur NEGATIVE NEGATIVE mg/dL   Urobilinogen, UA 0.2 0.0 - 1.0 mg/dL   Nitrite NEGATIVE NEGATIVE   Leukocytes, UA NEGATIVE NEGATIVE  Pregnancy, urine POC     Status: None   Collection Time: 09/02/17  8:58 PM  Result Value Ref Range   Preg Test, Ur NEGATIVE NEGATIVE  Glucose, capillary     Status: None   Collection Time: 09/02/17  9:25 PM  Result Value Ref Range   Glucose-Capillary 98 70 - 99 mg/dL   No results found.  ED Clinical Impression  Pelvic pain  IUD check up  Medication refill   ED Assessment/Plan  Glucose normal.  Patient is not pregnant.  Urine dip is normal.  She may be having cramping from the IUD, but it seems to be in place. there is no evidence of a uterine infection, perforation at this time.  Home with ibuprofen 600 mg 3-4 times a day as needed for pain.  Discussed with her that she will need to follow-up with the provider who placed the IUD if she has persistent pain despite the  ibuprofen.  Sent off gonorrhea, chlamydia, wet prep.  Will base treatment on lab results.  Also refilled Accu-Chek machine.  Patient states that she did not need lancets.  Discussed labs,  MDM, treatment plan, and plan for follow-up with patient.patient agrees with plan.   Meds ordered this encounter  Medications  . ibuprofen (ADVIL,MOTRIN) 600 MG tablet    Sig: Take 1 tablet (600 mg total) by mouth every 6 (six) hours as needed.    Dispense:  30 tablet    Refill:  0    *This clinic  note was created using Scientist, clinical (histocompatibility and immunogenetics)Dragon dictation software. Therefore, there may be occasional mistakes despite careful proofreading.   ?   Domenick GongMortenson, Zeya Balles, MD 09/02/17 2148

## 2017-09-03 LAB — CERVICOVAGINAL ANCILLARY ONLY
BACTERIAL VAGINITIS: NEGATIVE
Candida vaginitis: POSITIVE — AB
Chlamydia: NEGATIVE
NEISSERIA GONORRHEA: NEGATIVE
Trichomonas: NEGATIVE

## 2017-09-04 ENCOUNTER — Telehealth (HOSPITAL_COMMUNITY): Payer: Self-pay

## 2017-09-04 MED ORDER — FLUCONAZOLE 150 MG PO TABS: 150.0000 mg | ORAL_TABLET | Freq: Every day | ORAL | 0 refills | Status: AC

## 2017-09-04 NOTE — Telephone Encounter (Signed)
Pt contacted regarding test for candida (yeast) was positive.  Prescription for fluconazole 150mg po now, repeat dose in 3d if needed, #2 no refills, sent to the pharmacy of record.  Recheck or followup with PCP for further evaluation if symptoms are not improving.  Answered all questions.  

## 2017-09-05 ENCOUNTER — Telehealth (HOSPITAL_COMMUNITY): Payer: Self-pay | Admitting: Family Medicine

## 2017-09-05 ENCOUNTER — Telehealth (HOSPITAL_COMMUNITY): Payer: Self-pay

## 2017-09-05 ENCOUNTER — Encounter (HOSPITAL_COMMUNITY): Payer: Self-pay | Admitting: Family Medicine

## 2017-09-05 MED ORDER — BLOOD GLUCOSE METER KIT: PACK | 0 refills | Status: DC

## 2017-09-05 MED ORDER — MICONAZOLE NITRATE 1200 & 2 MG & % VA KIT: 1.0000 | PACK | Freq: Once | VAGINAL | 0 refills | Status: DC

## 2017-09-05 MED ORDER — MICONAZOLE NITRATE 1200 & 2 MG & % VA KIT: 1.0000 | PACK | Freq: Once | VAGINAL | 0 refills | Status: AC

## 2017-09-05 NOTE — Addendum Note (Signed)
Addended by: Georgetta HaberBURKY, NATALIE B on: 09/05/2017 06:29 PM   Modules accepted: Orders

## 2017-09-05 NOTE — Telephone Encounter (Signed)
Pharmacy did not receive script for miconazole, printed and provided to patient who presented in clinic to pick up.    Georgetta HaberNatalie B Burky, NP 09/05/2017 6:29 PM

## 2017-09-05 NOTE — Telephone Encounter (Signed)
Medication for yeast infection sent to pharmacy.   Unable to e prescribe the glucose meter.

## 2017-09-08 MED ORDER — CLOTRIMAZOLE 1 % VA CREA: 1.0000 | TOPICAL_CREAM | Freq: Every day | VAGINAL | 0 refills | Status: AC

## 2017-09-08 NOTE — Telephone Encounter (Signed)
No resolution of symptoms with one time dose of miconazole. Unable to take PO diflucan due to breastfeeding. 7 day course of clotrimazole provided.    Georgetta HaberNatalie B Burky, NP 09/05/2017 2:45 PM

## 2017-09-16 ENCOUNTER — Inpatient Hospital Stay (HOSPITAL_COMMUNITY)
Admission: AD | Admit: 2017-09-16 | Discharge: 2017-09-16 | Disposition: A | Discharge status: Home / Self Care | Payer: Medicaid Other | Attending: Family Medicine | Admitting: Family Medicine

## 2017-09-16 NOTE — MAU Note (Signed)
Not in lobby

## 2017-09-16 NOTE — MAU Note (Signed)
NOT IN LOBBY 

## 2017-10-10 ENCOUNTER — Encounter (HOSPITAL_COMMUNITY): Payer: Self-pay

## 2017-10-10 ENCOUNTER — Emergency Department (HOSPITAL_COMMUNITY)
Admission: EM | Admit: 2017-10-10 | Discharge: 2017-10-10 | Discharge status: Against Medical Advice | Payer: Medicaid Other | Attending: Emergency Medicine | Admitting: Emergency Medicine

## 2017-10-10 DIAGNOSIS — R300 Dysuria: Secondary | ICD-10-CM | POA: Insufficient documentation

## 2017-10-10 DIAGNOSIS — R102 Pelvic and perineal pain: Secondary | ICD-10-CM | POA: Diagnosis present

## 2017-10-10 LAB — COMPREHENSIVE METABOLIC PANEL
ALT: 28 U/L (ref 0–44)
ANION GAP: 12 (ref 5–15)
AST: 28 U/L (ref 15–41)
Albumin: 3.9 g/dL (ref 3.5–5.0)
Alkaline Phosphatase: 66 U/L (ref 38–126)
BUN: 9 mg/dL (ref 6–20)
CHLORIDE: 104 mmol/L (ref 98–111)
CO2: 23 mmol/L (ref 22–32)
Calcium: 9.6 mg/dL (ref 8.9–10.3)
Creatinine, Ser: 0.61 mg/dL (ref 0.44–1.00)
GFR calc non Af Amer: >60 mL/min (ref >60)
Glucose, Bld: 125 mg/dL — ABNORMAL HIGH (ref 70–99)
POTASSIUM: 4 mmol/L (ref 3.5–5.1)
SODIUM: 139 mmol/L (ref 135–145)
Total Bilirubin: 0.5 mg/dL (ref 0.3–1.2)
Total Protein: 8.1 g/dL (ref 6.5–8.1)

## 2017-10-10 LAB — CBC WITH DIFFERENTIAL/PLATELET
ABS IMMATURE GRANULOCYTES: 0 10*3/uL (ref 0.0–0.1)
Basophils Absolute: 0.1 10*3/uL (ref 0.0–0.1)
Basophils Relative: 1 %
Eosinophils Absolute: 0.3 10*3/uL (ref 0.0–0.7)
Eosinophils Relative: 3 %
HEMATOCRIT: 40.5 % (ref 36.0–46.0)
Hemoglobin: 12.8 g/dL (ref 12.0–15.0)
IMMATURE GRANULOCYTES: 0 %
LYMPHS ABS: 3.3 10*3/uL (ref 0.7–4.0)
Lymphocytes Relative: 31 %
MCH: 26.5 pg (ref 26.0–34.0)
MCHC: 31.6 g/dL (ref 30.0–36.0)
MCV: 83.9 fL (ref 78.0–100.0)
MONOS PCT: 6 %
Monocytes Absolute: 0.7 10*3/uL (ref 0.1–1.0)
NEUTROS ABS: 6.3 10*3/uL (ref 1.7–7.7)
NEUTROS PCT: 59 %
PLATELETS: 360 10*3/uL (ref 150–400)
RBC: 4.83 MIL/uL (ref 3.87–5.11)
RDW: 14.5 % (ref 11.5–15.5)
WBC: 10.6 10*3/uL — ABNORMAL HIGH (ref 4.0–10.5)

## 2017-10-10 LAB — I-STAT BETA HCG BLOOD, ED (MC, WL, AP ONLY): I-stat hCG, quantitative: <5 m[IU]/mL (ref <5)

## 2017-10-10 NOTE — ED Notes (Signed)
Found pt  

## 2017-10-10 NOTE — ED Notes (Signed)
Called for triage with no answer

## 2017-10-10 NOTE — ED Triage Notes (Signed)
Pt presents with 1 month h/o lower abdominal pain, dysuria and brown vaginal discharge.  Pt denies any nausea, vomiting or diarrhea.

## 2017-10-10 NOTE — ED Provider Notes (Signed)
Patient placed in Quick Look pathway, seen and evaluated   Chief Complaint: Vaginal pain, back pain,   HPI:   Patient reports Dysuria, frequency and urgency for about a month.  She also has brown vaginal discharge.  She reports it feels like when she has had a UTI in the past.  No fevers.  She is breast feeding.   ROS: No nausea or vomiting.   Physical Exam:   Gen: No distress  Neuro: Awake and Alert  Skin: Warm    Focused Exam: Abdomen is not distended.   Initiation of care has begun. The patient has been counseled on the process, plan, and necessity for staying for the completion/evaluation, and the remainder of the medical screening examination    Norman Clay 10/10/17 1344    Tilden Fossa, MD 10/10/17 [phone]

## 2017-10-10 NOTE — ED Notes (Signed)
Pt st's she has to leave because she has to feed her baby.  Encouraged pt to stay and to return if not any better.  Pt voices understanding

## 2017-10-12 ENCOUNTER — Emergency Department (HOSPITAL_COMMUNITY)
Admission: EM | Admit: 2017-10-12 | Discharge: 2017-10-12 | Disposition: A | Discharge status: Home / Self Care | Payer: Medicaid Other | Attending: Emergency Medicine | Admitting: Emergency Medicine

## 2017-10-12 ENCOUNTER — Encounter (HOSPITAL_COMMUNITY): Payer: Self-pay | Admitting: Emergency Medicine

## 2017-10-12 DIAGNOSIS — I10 Essential (primary) hypertension: Secondary | ICD-10-CM | POA: Diagnosis not present

## 2017-10-12 DIAGNOSIS — R102 Pelvic and perineal pain: Secondary | ICD-10-CM | POA: Insufficient documentation

## 2017-10-12 DIAGNOSIS — N3090 Cystitis, unspecified without hematuria: Secondary | ICD-10-CM | POA: Diagnosis not present

## 2017-10-12 DIAGNOSIS — R350 Frequency of micturition: Secondary | ICD-10-CM | POA: Diagnosis present

## 2017-10-12 DIAGNOSIS — Z79899 Other long term (current) drug therapy: Secondary | ICD-10-CM | POA: Diagnosis not present

## 2017-10-12 DIAGNOSIS — R1030 Lower abdominal pain, unspecified: Secondary | ICD-10-CM | POA: Diagnosis not present

## 2017-10-12 LAB — URINALYSIS, ROUTINE W REFLEX MICROSCOPIC
BILIRUBIN URINE: NEGATIVE
Bacteria, UA: NONE SEEN
Glucose, UA: NEGATIVE mg/dL
KETONES UR: NEGATIVE mg/dL
Nitrite: NEGATIVE
PROTEIN: NEGATIVE mg/dL
Specific Gravity, Urine: 1.016 (ref 1.005–1.030)
pH: 6 (ref 5.0–8.0)

## 2017-10-12 LAB — WET PREP, GENITAL
CLUE CELLS WET PREP: NONE SEEN
Sperm: NONE SEEN
TRICH WET PREP: NONE SEEN
Yeast Wet Prep HPF POC: NONE SEEN

## 2017-10-12 MED ORDER — CEPHALEXIN 500 MG PO CAPS: 500.0000 mg | ORAL_CAPSULE | Freq: Two times a day (BID) | ORAL | 0 refills | Status: AC

## 2017-10-12 NOTE — ED Notes (Signed)
Patient able to ambulate independently  

## 2017-10-12 NOTE — ED Notes (Signed)
EDP made patient aware of need for post-void residual, and patient refused procedure.

## 2017-10-12 NOTE — ED Triage Notes (Signed)
Pt states she was here on the 6th, waited for 5 hours with her baby who is breastfed and had to leave. Pt does not want anymore blood drawn. Pt is here for eval of painful, frequent urination with brown vaginal discharge. She also has lower back pain.

## 2017-10-12 NOTE — Discharge Instructions (Addendum)
We will treat you as if you have a urinary tract infection. Please take all of your antibiotics until finished!   You may develop abdominal discomfort or diarrhea from the antibiotic.  You may help offset this with probiotics which you can buy or get in yogurt. Do not eat or take the probiotics until 2 hours after your antibiotic.  Follow-up with OB/GYN should symptoms fail to resolve.

## 2017-10-12 NOTE — ED Notes (Signed)
APP at bedside performing pelvic 

## 2017-10-12 NOTE — ED Provider Notes (Signed)
[facility] EMERGENCY DEPARTMENT Provider Note   CSN: [id number] Arrival date & time: 10/12/17  1231     History   Chief Complaint Chief Complaint  Patient presents with  . Urinary Frequency  . Dysuria  . Vaginal Discharge    HPI Elizabeth Warren is a 37 y.o. female.  HPI   Elizabeth Warren is a 37 y.o. female, with a history of HTN and DM, presenting to the ED with suprapubic discomfort for the past month.  Describes the discomfort as a fullness.  Accompanied by lower back, almost buttock, discomfort that feels similar to the suprapubic discomfort.  Initially accompanied by urinary frequency and decreased urine output per urination session, but now states she is urinating a more normal amount, though frequency is still present. Abnormal vaginal discharge beginning about 4 days ago, increased in amount, brown in color, accompanied by intravaginal irritation. She states, "I had the exact same symptoms about a year ago and it ended up being a UTI." States she has not had a menstrual cycle in 2 months.  She had a Mirena placed about 6 months ago. She is currently breast-feeding a 13-monthold infant. Denies fever/chills, N/V/C/D, vaginal bleeding, dysuria, or any other complaints.   Past Medical History:  Diagnosis Date  . Gestational diabetes   . Hypertension   . NVD (normal vaginal delivery) 09/20/2010  . Pre-existing type 2 diabetes mellitus during pregnancy in second trimester 10/02/2016    Patient Active Problem List   Diagnosis Date Noted  . History of gestational diabetes 09/06/2016  . Obesity (BMI 30-39.9) 10/31/2011    Past Surgical History:  Procedure Laterality Date  . NO PAST SURGERIES       OB History    Gravida  5   Para  5   Term  5   Preterm  0   AB  0   Living  5     SAB  0   TAB  0   Ectopic  0   Multiple  0   Live Births  5            Home Medications    Prior to Admission medications   Medication Sig Start  Date End Date Taking? Authorizing Provider  ACCU-CHEK FASTCLIX LANCETS MISC 1 each by Does not apply route 4 (four) times daily. Patient not taking: Reported on 05/05/2017 11/12/16   DKandis CockingA, CNM  blood glucose meter kit and supplies Use as needed to check blood sugar 09/05/17   Bast, Traci A, NP  cephALEXin (KEFLEX) 500 MG capsule Take 1 capsule (500 mg total) by mouth 2 (two) times daily for 5 days. 10/12/17 10/17/17  Joy, SHelane Gunther PA-C  Cholecalciferol (VITAMIN D3) 400 units tablet Take 2 tablets (800 Units total) by mouth daily. 03/18/17   GSmiley Houseman MD  FENUGREEK PO Take by mouth.    [provider]  ferrous sulfate 325 (65 FE) MG EC tablet Take 325 mg by mouth 3 (three) times daily with meals.    [provider]  glucose blood (ACCU-CHEK GUIDE) test strip Use 1 test strip to check blood glucose 4 times daily Patient not taking: Reported on 05/05/2017 03/13/17   Anyanwu, USallyanne Havers MD  ibuprofen (ADVIL,MOTRIN) 600 MG tablet Take 1 tablet (600 mg total) by mouth every 6 (six) hours as needed. 09/02/17   MMelynda Ripple MD    Family History Family History  Problem Relation Age of Onset  . Diabetes  Mother   . Hypertension Mother   . Diabetes Father   . Hypertension Father   . Diabetes Maternal Grandmother   . Hypertension Maternal Grandmother   . Diabetes Maternal Grandfather   . Hypertension Maternal Grandfather   . Diabetes Paternal Grandmother   . Hypertension Paternal Grandmother   . Diabetes Paternal Grandfather   . Hypertension Paternal Grandfather   . Cancer Neg Hx   . Heart disease Neg Hx   . Stroke Neg Hx     Social History Social History   Tobacco Use  . Smoking status: Never Smoker  . Smokeless tobacco: Never Used  Substance Use Topics  . Alcohol use: No  . Drug use: No     Allergies   Patient has no known allergies.   Review of Systems Review of Systems  Constitutional: Negative for chills, diaphoresis and fever.    Respiratory: Negative for shortness of breath.   Cardiovascular: Negative for chest pain.  Gastrointestinal: Positive for abdominal pain. Negative for blood in stool, constipation, diarrhea, nausea and vomiting.  Genitourinary: Positive for frequency and vaginal discharge. Negative for dysuria, hematuria and vaginal bleeding.  Musculoskeletal: Positive for back pain.  All other systems reviewed and are negative.    Physical Exam Updated Vital Signs BP (!) 115/99   Pulse 96   Temp 98.9 F (37.2 C)   Resp 18   LMP 07/12/2017   SpO2 99%   Physical Exam  Constitutional: She appears well-developed and well-nourished. No distress.  HENT:  Head: Normocephalic and atraumatic.  Eyes: Conjunctivae are normal.  Neck: Neck supple.  Cardiovascular: Normal rate, regular rhythm, normal heart sounds and intact distal pulses.  Pulmonary/Chest: Effort normal and breath sounds normal. No respiratory distress.  Abdominal: Soft. There is tenderness in the suprapubic area. There is no guarding.  Questionable tenderness in the suprapubic region.  Genitourinary:  Genitourinary Comments: External genitalia normal Vagina with discharge - small amount of brownish-red discharge that appears to originate from the cervical os. Cervix  normal negative for cervical motion tenderness Adnexa palpated, no masses, negative for tenderness noted Bladder palpated negative for tenderness Uterus palpated no masses, negative for tenderness  No inguinal lymphadenopathy. Otherwise normal female genitalia. Med Tech, Opal Sidles, served as Producer, television/film/video during exam.  Musculoskeletal: She exhibits no edema.  Lymphadenopathy:    She has no cervical adenopathy.  Neurological: She is alert.  Skin: Skin is warm and dry. She is not diaphoretic.  Psychiatric: She has a normal mood and affect. Her behavior is normal.  Nursing note and vitals reviewed.    ED Treatments / Results  Labs (all labs ordered are listed, but only  abnormal results are displayed) Labs Reviewed  WET PREP, GENITAL - Abnormal; Notable for the following components:      Result Value   WBC, Wet Prep HPF POC FEW (*)    All other components within normal limits  URINALYSIS, ROUTINE W REFLEX MICROSCOPIC - Abnormal; Notable for the following components:   APPearance HAZY (*)    Hgb urine dipstick SMALL (*)    Leukocytes, UA TRACE (*)    All other components within normal limits  URINE CULTURE  GC/CHLAMYDIA PROBE AMP ([place]) NOT AT [facility]    EKG None  Radiology No results found.  Procedures Pelvic exam Date/Time: 10/12/2017 2:00 PM Performed by: Lorayne Bender, PA-C Authorized by: Lorayne Bender, PA-C  Consent: Verbal consent obtained. Risks and benefits: risks, benefits and alternatives were discussed Consent given by: patient Patient  identity confirmed: verbally with patient and provided demographic data Local anesthesia used: no  Anesthesia: Local anesthesia used: no  Sedation: Patient sedated: no  Patient tolerance: Patient tolerated the procedure well with no immediate complications    (including critical care time)  Medications Ordered in ED Medications - No data to display   Initial Impression / Assessment and Plan / ED Course  I have reviewed the triage vital signs and the nursing notes.  Pertinent labs & imaging results that were available during my care of the patient were reviewed by me and considered in my medical decision making (see chart for details).     Patient presents with suprapubic discomfort and urinary frequency which she states is consistent with UTI.  She was seen at urgent care on July 30 with what appears to be similar symptoms, but was not treated for UTI.  She was given treatment for vaginal candidiasis.  Patient presented to the ED 2 days ago, had blood work performed, but left without being seen due to the wait.  She had a negative hCG at that time.  Her lab work was also otherwise  reassuring.  She did not want to have lab work reperformed.  Suspect her amenorrhea may be due to her IUD.  We will treat with antibiotics that are relatively safe for breast-feeding mother.  Patient was counseled on the relatively small risk of adverse effects on the infant. Declined postvoid residual to assess for urinary retention.  She has been advised to follow-up with OB/GYN should symptoms fail to resolve.  Return precautions discussed.  Patient voices understanding of these instructions, accepts the plan, and is comfortable with discharge.     Final Clinical Impressions(s) / ED Diagnoses   Final diagnoses:  Suprapubic pain  Cystitis    ED Discharge Orders         Ordered    cephALEXin (KEFLEX) 500 MG capsule  2 times daily     10/12/17 1409           Lorayne Bender, PA-C 10/12/17 1439    Dorie Rank, MD 10/14/17 [phone]

## 2017-10-12 NOTE — ED Notes (Signed)
This RN offered patient translator services.  Patient refused and states she would prefer to use her young daughter.  This RN stated she would be back after collecting translator machine.  Patient again refused.

## 2017-10-13 LAB — URINE CULTURE

## 2017-10-13 LAB — GC/CHLAMYDIA PROBE AMP (~~LOC~~) NOT AT ARMC
CHLAMYDIA, DNA PROBE: NEGATIVE
NEISSERIA GONORRHEA: NEGATIVE

## 2017-10-22 ENCOUNTER — Ambulatory Visit: Payer: Medicaid Other

## 2017-10-23 ENCOUNTER — Other Ambulatory Visit: Payer: Self-pay

## 2017-10-23 ENCOUNTER — Ambulatory Visit (INDEPENDENT_AMBULATORY_CARE_PROVIDER_SITE_OTHER): Payer: Medicaid Other | Admitting: Family Medicine

## 2017-10-23 VITALS — BP 130/88 | HR 105 | Temp 99.3°F | Wt 201.0 lb

## 2017-10-23 DIAGNOSIS — B373 Candidiasis of vulva and vagina: Secondary | ICD-10-CM | POA: Diagnosis present

## 2017-10-23 DIAGNOSIS — B3731 Acute candidiasis of vulva and vagina: Secondary | ICD-10-CM

## 2017-10-23 DIAGNOSIS — O24112 Pre-existing diabetes mellitus, type 2, in pregnancy, second trimester: Secondary | ICD-10-CM

## 2017-10-23 DIAGNOSIS — R399 Unspecified symptoms and signs involving the genitourinary system: Secondary | ICD-10-CM

## 2017-10-23 LAB — POCT URINALYSIS DIP (MANUAL ENTRY)
BILIRUBIN UA: NEGATIVE
Blood, UA: NEGATIVE
Glucose, UA: NEGATIVE mg/dL
Ketones, POC UA: NEGATIVE mg/dL
Leukocytes, UA: NEGATIVE
Nitrite, UA: NEGATIVE
PH UA: 6 (ref 5.0–8.0)
PROTEIN UA: NEGATIVE mg/dL
Spec Grav, UA: 1.025 (ref 1.010–1.025)
Urobilinogen, UA: 0.2 E.U./dL

## 2017-10-23 MED ORDER — VITAMIN D3 10 MCG (400 UNIT) PO TABS: 800.0000 [IU] | ORAL_TABLET | Freq: Every day | ORAL | 2 refills | Status: DC

## 2017-10-23 MED ORDER — FLUCONAZOLE 150 MG PO TABS: 150.0000 mg | ORAL_TABLET | Freq: Once | ORAL | 0 refills | Status: AC

## 2017-10-23 MED ORDER — ACCU-CHEK FASTCLIX LANCETS MISC: 1.0000 | Freq: Four times a day (QID) | 9 refills | Status: DC

## 2017-10-23 MED ORDER — BLOOD GLUCOSE METER KIT: PACK | 0 refills | Status: DC

## 2017-10-23 NOTE — Patient Instructions (Signed)
It was nice meeting you today Ms. Fallas!  Please take diflucan (one pill) for your yeast infection.  If it is not better in three days, call our clinic, and I can send another pill.  I have refilled your diabetes supplies and vitamin D.  If you have any questions or concerns, please feel free to call the clinic.   Be well,  Dr. Frances FurbishWinfrey

## 2017-10-23 NOTE — Progress Notes (Signed)
   Subjective:    Elizabeth OsmondZuhal B Egner - 37 y.o. female MRN [id number]  Date of birth: 10/03/1980  HPI  Elizabeth Warren is here for vulvar itching.  She went to the emergency department on September 8 for urinary symptoms and was diagnosed with a UTI and given Keflex.  She finished the Keflex course, and her pain and urinary symptoms have resolved, but she has developed significant vulvar itching.  She denies any changes in her vaginal discharge.  She denies fevers, chills, or change in appetite.  He says she feels overall well, but her vulvar itching is very bothersome.  She says it is like a previous yeast infection she has had.  She declines pelvic exam today.     Health Maintenance:  Health Maintenance Due  Topic Date Due  . INFLUENZA VACCINE  09/04/2017    -  reports that she has never smoked. She has never used smokeless tobacco. - Review of Systems: Per HPI. - Past Medical History: Patient Active Problem List   Diagnosis Date Noted  . History of gestational diabetes 09/06/2016  . Obesity (BMI 30-39.9) 10/31/2011  . Vaginal candidiasis 09/16/2006   - Medications: reviewed and updated   Objective:   Physical Exam BP 130/88 (BP Location: Right Arm)   Pulse (!) 105   Temp 99.3 F (37.4 C) (Oral)   Wt 201 lb (91.2 kg)   BMI 39.26 kg/m  Gen: NAD, alert, cooperative with exam, well-appearing Abd: SNTND, BS present, no guarding or organomegaly Neuro: no gross deficits.  Psych: good insight, alert and oriented    Assessment & Plan:   Vaginal candidiasis Due to patient's history and recent antibiotic use, patient likely has a yeast infection.  She was prescribed Diflucan 150 mg to be taken once.  Patient was told that if she continues to have symptoms for 3 days after taking this medication, she should call the clinic, and I can prescribe a second pill.    Lezlie OctaveAmanda Dyani Babel, M.D. 10/23/2017, 10:50 AM PGY-2, [facility]

## 2017-10-23 NOTE — Assessment & Plan Note (Signed)
Due to patient's history and recent antibiotic use, patient likely has a yeast infection.  She was prescribed Diflucan 150 mg to be taken once.  Patient was told that if she continues to have symptoms for 3 days after taking this medication, she should call the clinic, and I can prescribe a second pill.

## 2017-10-27 ENCOUNTER — Other Ambulatory Visit: Payer: Self-pay | Admitting: Family Medicine

## 2017-10-27 NOTE — Telephone Encounter (Signed)
Will forward to Dr. Frances FurbishWinfrey who saw patient for this last visit.  Elizabeth Warren,CMA

## 2017-10-27 NOTE — Telephone Encounter (Signed)
Patient was told if she is still having trouble with her yeast infection, to call us and ask for additional medication for this.  [facility] [address].  Thank you

## 2017-10-28 ENCOUNTER — Other Ambulatory Visit: Payer: Self-pay | Admitting: Family Medicine

## 2017-10-28 MED ORDER — FLUCONAZOLE 150 MG PO TABS: 150.0000 mg | ORAL_TABLET | Freq: Once | ORAL | 0 refills | Status: AC

## 2017-10-28 NOTE — Telephone Encounter (Signed)
LVM to call office back to inform her of below.Zimmerman Rumple, April D, CMA  

## 2017-10-28 NOTE — Telephone Encounter (Signed)
Sent in diflucan 150 mg x 1.  If patient continues having symptoms, she will need to return to the clinic.

## 2017-11-04 NOTE — Telephone Encounter (Signed)
Contacted pt to see if she had picked up her medication and she said she has and that everything is good now. Lamonte Sakai, April D, [place]

## 2017-12-21 ENCOUNTER — Other Ambulatory Visit: Payer: Self-pay

## 2017-12-21 ENCOUNTER — Encounter (HOSPITAL_COMMUNITY): Payer: Self-pay

## 2017-12-21 ENCOUNTER — Emergency Department (HOSPITAL_COMMUNITY)
Admission: EM | Admit: 2017-12-21 | Discharge: 2017-12-21 | Disposition: A | Discharge status: Home / Self Care | Payer: Medicaid Other | Attending: Emergency Medicine | Admitting: Emergency Medicine

## 2017-12-21 DIAGNOSIS — S39012A Strain of muscle, fascia and tendon of lower back, initial encounter: Secondary | ICD-10-CM | POA: Diagnosis not present

## 2017-12-21 DIAGNOSIS — Z79899 Other long term (current) drug therapy: Secondary | ICD-10-CM | POA: Diagnosis not present

## 2017-12-21 DIAGNOSIS — X500XXA Overexertion from strenuous movement or load, initial encounter: Secondary | ICD-10-CM | POA: Diagnosis not present

## 2017-12-21 DIAGNOSIS — Y999 Unspecified external cause status: Secondary | ICD-10-CM | POA: Diagnosis not present

## 2017-12-21 DIAGNOSIS — Y929 Unspecified place or not applicable: Secondary | ICD-10-CM | POA: Diagnosis not present

## 2017-12-21 DIAGNOSIS — I10 Essential (primary) hypertension: Secondary | ICD-10-CM | POA: Insufficient documentation

## 2017-12-21 DIAGNOSIS — Y939 Activity, unspecified: Secondary | ICD-10-CM | POA: Insufficient documentation

## 2017-12-21 DIAGNOSIS — S29012A Strain of muscle and tendon of back wall of thorax, initial encounter: Secondary | ICD-10-CM | POA: Diagnosis present

## 2017-12-21 MED ORDER — KETOROLAC TROMETHAMINE 30 MG/ML IJ SOLN
30.0000 mg | Freq: Once | INTRAMUSCULAR | Status: AC
  Administered 2017-12-21: 30 mg via INTRAMUSCULAR
  Filled 2017-12-21: qty 1

## 2017-12-21 MED ORDER — IBUPROFEN 600 MG PO TABS: 600.0000 mg | ORAL_TABLET | Freq: Four times a day (QID) | ORAL | 0 refills | Status: DC | PRN

## 2017-12-21 MED ORDER — CYCLOBENZAPRINE HCL 10 MG PO TABS: 10.0000 mg | ORAL_TABLET | Freq: Two times a day (BID) | ORAL | 0 refills | Status: DC | PRN

## 2017-12-21 NOTE — ED Provider Notes (Signed)
[facility] EMERGENCY DEPARTMENT Provider Note   CSN: [id number] Arrival date & time: 12/21/17  1034     History   Chief Complaint Chief Complaint  Patient presents with  . Shoulder Pain    HPI Elizabeth Warren is a 37 y.o. female.  Pt presents to the ED today with left shoulder pain.  She points to her scapula area.  It is worse with movement.  No trauma.  Pt is breastfeeding a baby that was born in January and is lifting the baby a lot.  The pt said she has been driving her kids around a lot as well.  She denies any cp or breast pain.     Past Medical History:  Diagnosis Date  . Gestational diabetes   . Hypertension   . NVD (normal vaginal delivery) 09/20/2010  . Pre-existing type 2 diabetes mellitus during pregnancy in second trimester 10/02/2016    Patient Active Problem List   Diagnosis Date Noted  . History of gestational diabetes 09/06/2016  . Obesity (BMI 30-39.9) 10/31/2011  . Vaginal candidiasis 09/16/2006    Past Surgical History:  Procedure Laterality Date  . NO PAST SURGERIES       OB History    Gravida  5   Para  5   Term  5   Preterm  0   AB  0   Living  5     SAB  0   TAB  0   Ectopic  0   Multiple  0   Live Births  5            Home Medications    Prior to Admission medications   Medication Sig Start Date End Date Taking? Authorizing Provider  ACCU-CHEK FASTCLIX LANCETS MISC 1 each by Does not apply route 4 (four) times daily. 10/23/17   Kathrene Alu, MD  blood glucose meter kit and supplies Use as needed to check blood sugar 10/23/17   Kathrene Alu, MD  Cholecalciferol (VITAMIN D3) 400 units tablet Take 2 tablets (800 Units total) by mouth daily. 10/23/17   Kathrene Alu, MD  cyclobenzaprine (FLEXERIL) 10 MG tablet Take 1 tablet (10 mg total) by mouth 2 (two) times daily as needed for muscle spasms. 12/21/17   Isla Pence, MD  FENUGREEK PO Take by mouth.    [provider]    ferrous sulfate 325 (65 FE) MG EC tablet Take 325 mg by mouth 3 (three) times daily with meals.    [provider]  glucose blood (ACCU-CHEK GUIDE) test strip Use 1 test strip to check blood glucose 4 times daily Patient not taking: Reported on 05/05/2017 03/13/17   Anyanwu, Sallyanne Havers, MD  ibuprofen (ADVIL,MOTRIN) 600 MG tablet Take 1 tablet (600 mg total) by mouth every 6 (six) hours as needed. 12/21/17   Isla Pence, MD    Family History Family History  Problem Relation Age of Onset  . Diabetes Mother   . Hypertension Mother   . Diabetes Father   . Hypertension Father   . Diabetes Maternal Grandmother   . Hypertension Maternal Grandmother   . Diabetes Maternal Grandfather   . Hypertension Maternal Grandfather   . Diabetes Paternal Grandmother   . Hypertension Paternal Grandmother   . Diabetes Paternal Grandfather   . Hypertension Paternal Grandfather   . Cancer Neg Hx   . Heart disease Neg Hx   . Stroke Neg Hx     Social History Social History  Tobacco Use  . Smoking status: Never Smoker  . Smokeless tobacco: Never Used  Substance Use Topics  . Alcohol use: No  . Drug use: No     Allergies   Patient has no known allergies.   Review of Systems Review of Systems  Musculoskeletal: Positive for back pain.  All other systems reviewed and are negative.    Physical Exam Updated Vital Signs BP 120/89   Pulse 94   Temp 98.5 F (36.9 C) (Oral)   Resp 18   Ht 5' (1.524 m)   Wt 90.7 kg   SpO2 99%   Breastfeeding? Yes   BMI 39.06 kg/m   Physical Exam  Constitutional: She is oriented to person, place, and time. She appears well-developed and well-nourished.  HENT:  Head: Normocephalic and atraumatic.  Right Ear: External ear normal.  Left Ear: External ear normal.  Nose: Nose normal.  Mouth/Throat: Oropharynx is clear and moist.  Eyes: Pupils are equal, round, and reactive to light. Conjunctivae and EOM are normal.  Neck: Normal range of motion.  Neck supple.  Cardiovascular: Normal rate, regular rhythm, normal heart sounds and intact distal pulses.  Pulmonary/Chest: Effort normal and breath sounds normal.  Abdominal: Soft. Bowel sounds are normal.  Musculoskeletal:       Arms: Neurological: She is alert and oriented to person, place, and time.  Skin: Skin is warm. Capillary refill takes less than 2 seconds.  Psychiatric: She has a normal mood and affect. Her behavior is normal. Judgment and thought content normal.  Nursing note and vitals reviewed.    ED Treatments / Results  Labs (all labs ordered are listed, but only abnormal results are displayed) Labs Reviewed - No data to display  EKG EKG Interpretation  Date/Time:  _0 /17/19 [place], Gerda Yin, MD 12/21/17 1056

## 2017-12-21 NOTE — ED Notes (Signed)
Verified with Dr. Particia NearingHaviland to see if toradol was safe to administer while breastfeeding ; per dr. Particia NearingHaviland ok to administer

## 2017-12-21 NOTE — ED Triage Notes (Signed)
Pt c/o left shoulder pain that began yesterday ; denies any trauma ; pt states the pain worsens with movement ; states it feels the same muscular pain she had a couple of years ago

## 2017-12-21 NOTE — ED Notes (Signed)
No urine pregnancy needed per dr. Particia Nearinghaviland ; no further orders received

## 2017-12-29 ENCOUNTER — Telehealth: Payer: Self-pay | Admitting: Family Medicine

## 2017-12-29 NOTE — Telephone Encounter (Signed)
Will forward to MD.  Elizabeth ReaUnsure of which type patient is taking.  Elizabeth Warren,CMA

## 2017-12-29 NOTE — Telephone Encounter (Signed)
Would like a refill on her prenatal vitamins sent to [facility], [address], says there are no more refills.  Please call [phone] if you have any questions.

## 2017-12-30 NOTE — Telephone Encounter (Signed)
Patient called again inquiring about getting prenatal vitamins.  Please call her today if possible.

## 2017-12-31 ENCOUNTER — Other Ambulatory Visit: Payer: Self-pay | Admitting: Family Medicine

## 2017-12-31 MED ORDER — PRENATAL 27-1 MG PO TABS: 1.0000 | ORAL_TABLET | Freq: Every day | ORAL | 1 refills | Status: DC

## 2017-12-31 NOTE — Telephone Encounter (Signed)
I have sent prenatal vitamins to pharmacy, patient may also get them over the counter. If she is concerned she may be pregnant, then she will need to come in for an appointment.

## 2018-01-13 ENCOUNTER — Ambulatory Visit: Payer: Medicaid Other

## 2018-01-14 ENCOUNTER — Ambulatory Visit (INDEPENDENT_AMBULATORY_CARE_PROVIDER_SITE_OTHER): Payer: Medicaid Other | Admitting: Family Medicine

## 2018-01-14 ENCOUNTER — Other Ambulatory Visit: Payer: Self-pay

## 2018-01-14 VITALS — BP 118/84 | HR 101 | Temp 98.4°F | Ht 60.0 in | Wt 200.4 lb

## 2018-01-14 DIAGNOSIS — N3001 Acute cystitis with hematuria: Secondary | ICD-10-CM | POA: Diagnosis not present

## 2018-01-14 DIAGNOSIS — R300 Dysuria: Secondary | ICD-10-CM | POA: Diagnosis present

## 2018-01-14 LAB — POCT URINALYSIS DIP (MANUAL ENTRY)
BILIRUBIN UA: NEGATIVE mg/dL
Bilirubin, UA: NEGATIVE
Glucose, UA: NEGATIVE mg/dL
LEUKOCYTES UA: NEGATIVE
NITRITE UA: NEGATIVE
PH UA: 6 (ref 5.0–8.0)
Protein Ur, POC: 30 mg/dL — AB
Spec Grav, UA: 1.02 (ref 1.010–1.025)
Urobilinogen, UA: 0.2 E.U./dL

## 2018-01-14 MED ORDER — AMOXICILLIN-POT CLAVULANATE 875-125 MG PO TABS: 1.0000 | ORAL_TABLET | Freq: Two times a day (BID) | ORAL | 0 refills | Status: DC

## 2018-01-14 NOTE — Progress Notes (Signed)
    Subjective:  Elizabeth Warren is a 37 y.o. female who presents [facility] today with a chief complaint of urinary frequency.   HPI:   DYSURIA  Urinary frequency and back pain started 1 week ago.  Pain is: lower back bilaterally Medications tried: ibuprofen Any antibiotics in the last 30 days: no More than 3 UTIs in the last 12 months: no STD exposure: no Possibly pregnant: Pt has mirena IUD  Symptoms Urgency: yes, multiple times at night Frequency: yes Blood in urine: on UA Pain in back:yes Fever: no Vaginal discharge: no Mouth Ulcers: no  Review of Symptoms - see HPI PMH - Smoking status noted.    Objective:  Physical Exam: BP 118/84   Pulse (!) 101   Temp 98.4 F (36.9 C) (Oral)   Ht 5' (1.524 m)   Wt 200 lb 6.4 oz (90.9 kg)   SpO2 98%   BMI 39.14 kg/m   Gen: NAD, resting comfortably Back: nontender to palpation, no CVA tenderness Abdomen: nontender, nondistended CV: regular rate, no chest deformity Pulm: NWOB, no respiratory distress GI: Normal bowel sounds present. Soft, Nontender, Nondistended. MSK: no edema, cyanosis, or clubbing noted Skin: warm, dry Neuro: alert, oriented to situation Psych: Normal affect and thought content  Results for orders placed or performed in visit on 01/14/18 (from the past 72 hour(s))  POCT urinalysis dipstick     Status: Abnormal   Collection Time: 01/14/18  8:50 AM  Result Value Ref Range   Color, UA yellow yellow   Clarity, UA cloudy (A) clear   Glucose, UA negative negative mg/dL   Bilirubin, UA negative negative   Ketones, POC UA negative negative mg/dL   Spec Grav, UA [id number] - 1.025   Blood, UA small (A) negative   pH, UA 6.0 5.0 - 8.0   Protein Ur, POC =30 (A) negative mg/dL   Urobilinogen, UA 0.2 0.2 or 1.0 E.U./dL   Nitrite, UA Negative Negative   Leukocytes, UA Negative Negative     Assessment/Plan:  Patient presented with urinary frequency.  Also has mild blood in urine, no leuks or nitrates.   Will send urine for culture.  Will treat empirically for UTI.  Call patient to discontinue antibiotics if culture negative.  Lab Orders     Urine Culture     POCT urinalysis dipstick  Meds ordered this encounter  Medications  . amoxicillin-clavulanate (AUGMENTIN) 875-125 MG tablet    Sig: Take 1 tablet by mouth 2 (two) times daily for 10 days.    Dispense:  20 tablet    Refill:  0      Thomes DinningBrad Cheskel Silverio, MD, MS FAMILY MEDICINE RESIDENT - PGY2 01/14/2018 9:29 AM

## 2018-01-14 NOTE — Patient Instructions (Signed)
It was a pleasure to see you today! Thank you for choosing [facility] for your primary care. Elizabeth Warren was seen for urinary frequency.   You may have a urinary tract infection. We will treat you with antibiotics. If symptoms do not improve in 1 week, come back to clinic for further evaluation.  Best,  Thomes DinningBrad Chelli Yerkes, MD, MS FAMILY MEDICINE RESIDENT - PGY2 01/14/2018 9:16 AM

## 2018-01-16 LAB — URINE CULTURE

## 2018-01-19 ENCOUNTER — Encounter: Payer: Self-pay | Admitting: Family Medicine

## 2018-01-22 ENCOUNTER — Encounter (HOSPITAL_COMMUNITY): Payer: Self-pay | Admitting: *Deleted

## 2018-01-22 ENCOUNTER — Inpatient Hospital Stay (HOSPITAL_COMMUNITY)
Admission: AD | Admit: 2018-01-22 | Discharge: 2018-01-22 | Disposition: A | Discharge status: Home / Self Care | Payer: Medicaid Other | Attending: Obstetrics and Gynecology | Admitting: Obstetrics and Gynecology

## 2018-01-22 DIAGNOSIS — R102 Pelvic and perineal pain: Secondary | ICD-10-CM | POA: Diagnosis present

## 2018-01-22 DIAGNOSIS — B373 Candidiasis of vulva and vagina: Secondary | ICD-10-CM | POA: Diagnosis not present

## 2018-01-22 DIAGNOSIS — Z3202 Encounter for pregnancy test, result negative: Secondary | ICD-10-CM | POA: Diagnosis not present

## 2018-01-22 DIAGNOSIS — B3731 Acute candidiasis of vulva and vagina: Secondary | ICD-10-CM

## 2018-01-22 LAB — URINALYSIS, ROUTINE W REFLEX MICROSCOPIC
Bacteria, UA: NONE SEEN
Bilirubin Urine: NEGATIVE
GLUCOSE, UA: NEGATIVE mg/dL
Ketones, ur: NEGATIVE mg/dL
Leukocytes, UA: NEGATIVE
Nitrite: NEGATIVE
PH: 6 (ref 5.0–8.0)
Protein, ur: NEGATIVE mg/dL
SPECIFIC GRAVITY, URINE: 1.019 (ref 1.005–1.030)

## 2018-01-22 LAB — CBC
HCT: 39.5 % (ref 36.0–46.0)
HEMOGLOBIN: 13.3 g/dL (ref 12.0–15.0)
MCH: 27.6 pg (ref 26.0–34.0)
MCHC: 33.7 g/dL (ref 30.0–36.0)
MCV: 82 fL (ref 80.0–100.0)
Platelets: 351 10*3/uL (ref 150–400)
RBC: 4.82 MIL/uL (ref 3.87–5.11)
RDW: 13.8 % (ref 11.5–15.5)
WBC: 11.6 10*3/uL — ABNORMAL HIGH (ref 4.0–10.5)
nRBC: 0 % (ref 0.0–0.2)

## 2018-01-22 LAB — WET PREP, GENITAL
Clue Cells Wet Prep HPF POC: NONE SEEN
SPERM: NONE SEEN
Trich, Wet Prep: NONE SEEN
YEAST WET PREP: NONE SEEN

## 2018-01-22 LAB — POCT PREGNANCY, URINE: Preg Test, Ur: NEGATIVE

## 2018-01-22 MED ORDER — KETOROLAC TROMETHAMINE 60 MG/2ML IM SOLN
60.0000 mg | Freq: Once | INTRAMUSCULAR | Status: AC
  Administered 2018-01-22: 60 mg via INTRAMUSCULAR
  Filled 2018-01-22: qty 2

## 2018-01-22 MED ORDER — NYSTATIN-TRIAMCINOLONE 100000-0.1 UNIT/GM-% EX CREA: TOPICAL_CREAM | Freq: Three times a day (TID) | CUTANEOUS | 1 refills | Status: DC

## 2018-01-22 MED ORDER — FLUCONAZOLE 150 MG PO TABS: 150.0000 mg | ORAL_TABLET | Freq: Once | ORAL | 0 refills | Status: AC

## 2018-01-22 NOTE — MAU Note (Signed)
Pt C/O lower abdominal & back pain for the last 5 days, ibuprofen helps.  Has had brown/red spotting for the last 20 days.  Also C/O urinary frequency, also pain.  Has vaginal itching.

## 2018-01-22 NOTE — Discharge Instructions (Signed)
Vaginal Yeast infection, Adult    Vaginal yeast infection is a condition that causes vaginal discharge as well as soreness, swelling, and redness (inflammation) of the vagina. This is a common condition. Some women get this infection frequently.  What are the causes?  This condition is caused by a change in the normal balance of the yeast (candida) and bacteria that live in the vagina. This change causes an overgrowth of yeast, which causes the inflammation.  What increases the risk?  The condition is more likely to develop in women who:   Take antibiotic medicines.   Have diabetes.   Take birth control pills.   Are pregnant.   Douche often.   Have a weak body defense system (immune system).   Have been taking steroid medicines for a long time.   Frequently wear tight clothing.  What are the signs or symptoms?  Symptoms of this condition include:   White, thick, creamy vaginal discharge.   Swelling, itching, redness, and irritation of the vagina. The lips of the vagina (vulva) may be affected as well.   Pain or a burning feeling while urinating.   Pain during sex.  How is this diagnosed?  This condition is diagnosed based on:   Your medical history.   A physical exam.   A pelvic exam. Your health care provider will examine a sample of your vaginal discharge under a microscope. Your health care provider may send this sample for testing to confirm the diagnosis.  How is this treated?  This condition is treated with medicine. Medicines may be over-the-counter or prescription. You may be told to use one or more of the following:   Medicine that is taken by mouth (orally).   Medicine that is applied as a cream (topically).   Medicine that is inserted directly into the vagina (suppository).  Follow these instructions at home:    Lifestyle   Do not have sex until your health care provider approves. Tell your sex partner that you have a yeast infection. That person should go to his or her health care  provider and ask if they should also be treated.   Do not wear tight clothes, such as pantyhose or tight pants.   Wear breathable cotton underwear.  General instructions   Take or apply over-the-counter and prescription medicines only as told by your health care provider.   Eat more yogurt. This may help to keep your yeast infection from returning.   Do not use tampons until your health care provider approves.   Try taking a sitz bath to help with discomfort. This is a warm water bath that is taken while you are sitting down. The water should only come up to your hips and should cover your buttocks. Do this 3-4 times per day or as told by your health care provider.   Do not douche.   If you have diabetes, keep your blood sugar levels under control.   Keep all follow-up visits as told by your health care provider. This is important.  Contact a health care provider if:   You have a fever.   Your symptoms go away and then return.   Your symptoms do not get better with treatment.   Your symptoms get worse.   You have new symptoms.   You develop blisters in or around your vagina.   You have blood coming from your vagina and it is not your menstrual period.   You develop pain in your abdomen.  Summary     Vaginal yeast infection is a condition that causes discharge as well as soreness, swelling, and redness (inflammation) of the vagina.   This condition is treated with medicine. Medicines may be over-the-counter or prescription.   Take or apply over-the-counter and prescription medicines only as told by your health care provider.   Do not douche. Do not have sex or use tampons until your health care provider approves.   Contact a health care provider if your symptoms do not get better with treatment or your symptoms go away and then return.  This information is not intended to replace advice given to you by your health care provider. Make sure you discuss any questions you have with your health care  provider.  Document Released: 10/31/2004 Document Revised: 06/09/2017 Document Reviewed: 06/09/2017  Elsevier Interactive Patient Education  2019 [employer]

## 2018-01-22 NOTE — MAU Provider Note (Signed)
History     CSN: [id number]  Arrival date and time: 01/22/18 1559   First Provider Initiated Contact with Patient 01/22/18 1850      Chief Complaint  Patient presents with  . Pelvic Pain  . Back Pain  . Dysuria   Elizabeth Warren is a 37 y.o. [id number] who is here today with cramps and bleeding. She states that she has been bleeding for 20 days. She is also having cramps, back pain, vaginal discharge, vulvar rash. She is currently breastfeeding. She had Mirena placed 04/03/17 in the [facility].   Pelvic Pain  The patient's primary symptoms include pelvic pain, vaginal bleeding and vaginal discharge. This is a new problem. Episode onset: 20 days ago. The problem occurs intermittently. The problem has been unchanged. Pain severity now: 10/10. The problem affects both sides. She is not pregnant. Associated symptoms include back pain and dysuria. Pertinent negatives include no chills, fever, frequency, nausea, urgency or vomiting. The vaginal discharge was bloody and brown. The vaginal bleeding is typical of menses. She has not been passing clots. She has not been passing tissue. Nothing aggravates the symptoms. She has tried NSAIDs for the symptoms. The treatment provided no relief. She is sexually active. She uses an IUD for contraception. Her menstrual history has been irregular.  Back Pain  Associated symptoms include dysuria and pelvic pain. Pertinent negatives include no fever.  Dysuria   Pertinent negatives include no chills, frequency, nausea, urgency or vomiting.    OB History    Gravida  5   Para  5   Term  5   Preterm  0   AB  0   Living  5     SAB  0   TAB  0   Ectopic  0   Multiple  0   Live Births  5           Past Medical History:  Diagnosis Date  . Gestational diabetes   . Hypertension    pregnancy  . NVD (normal vaginal delivery) 09/20/2010  . Pre-existing type 2 diabetes mellitus during pregnancy in second trimester 10/02/2016    Past Surgical  History:  Procedure Laterality Date  . NO PAST SURGERIES      Family History  Problem Relation Age of Onset  . Diabetes Mother   . Hypertension Mother   . Diabetes Father   . Hypertension Father   . Diabetes Maternal Grandmother   . Hypertension Maternal Grandmother   . Diabetes Maternal Grandfather   . Hypertension Maternal Grandfather   . Diabetes Paternal Grandmother   . Hypertension Paternal Grandmother   . Diabetes Paternal Grandfather   . Hypertension Paternal Grandfather   . Cancer Neg Hx   . Heart disease Neg Hx   . Stroke Neg Hx     Social History   Tobacco Use  . Smoking status: Never Smoker  . Smokeless tobacco: Never Used  Substance Use Topics  . Alcohol use: No  . Drug use: No    Allergies: No Known Allergies  Medications Prior to Admission  Medication Sig Dispense Refill Last Dose  . ACCU-CHEK FASTCLIX LANCETS MISC 1 each by Does not apply route 4 (four) times daily. 100 each 9   . amoxicillin-clavulanate (AUGMENTIN) 875-125 MG tablet Take 1 tablet by mouth 2 (two) times daily for 10 days. 20 tablet 0   . blood glucose meter kit and supplies Use as needed to check blood sugar 1 each 0   . Cholecalciferol (  VITAMIN D3) 400 units tablet Take 2 tablets (800 Units total) by mouth daily. 60 tablet 2   . cyclobenzaprine (FLEXERIL) 10 MG tablet Take 1 tablet (10 mg total) by mouth 2 (two) times daily as needed for muscle spasms. 20 tablet 0   . FENUGREEK PO Take by mouth.   Taking  . ferrous sulfate 325 (65 FE) MG EC tablet Take 325 mg by mouth 3 (three) times daily with meals.   09/02/2017 at Unknown time  . glucose blood (ACCU-CHEK GUIDE) test strip Use 1 test strip to check blood glucose 4 times daily (Patient not taking: Reported on 05/05/2017) 100 each 9 Not Taking  . ibuprofen (ADVIL,MOTRIN) 600 MG tablet Take 1 tablet (600 mg total) by mouth every 6 (six) hours as needed. 30 tablet 0   . PRENATAL 27-1 MG TABS Take 1 tablet by mouth daily. 90 each 1      Review of Systems  Constitutional: Negative for chills and fever.  Gastrointestinal: Negative for nausea and vomiting.  Genitourinary: Positive for dysuria, pelvic pain and vaginal discharge. Negative for frequency, urgency and vaginal bleeding.  Musculoskeletal: Positive for back pain.   Physical Exam   Blood pressure 124/72, pulse (!) 102, temperature 98 F (36.7 C), temperature source Oral, resp. rate 18, weight 89.4 kg, last menstrual period 01/04/2018, currently breastfeeding.  Physical Exam  Nursing note and vitals reviewed. Constitutional: She is oriented to person, place, and time. She appears well-developed and well-nourished. No distress.  HENT:  Head: Normocephalic.  Cardiovascular: Normal rate.  Respiratory: Effort normal.  GI: Soft. There is no abdominal tenderness. There is no rebound.  Genitourinary:    Genitourinary Comments:  External: yeast appearing rash to inner thighs  Vagina:  white discharge Cervix: pink, smooth, no CMT, IUD strings seen  Uterus: NSSC Adnexa: NT    Neurological: She is alert and oriented to person, place, and time.  Skin: Skin is warm and dry.  Psychiatric: She has a normal mood and affect.   Results for orders placed or performed during the hospital encounter of 01/22/18 (from the past 24 hour(s))  Urinalysis, Routine w reflex microscopic     Status: Abnormal   Collection Time: 01/22/18  4:38 PM  Result Value Ref Range   Color, Urine YELLOW YELLOW   APPearance CLEAR CLEAR   Specific Gravity, Urine 1.019 1.005 - 1.030   pH 6.0 5.0 - 8.0   Glucose, UA NEGATIVE NEGATIVE mg/dL   Hgb urine dipstick SMALL (A) NEGATIVE   Bilirubin Urine NEGATIVE NEGATIVE   Ketones, ur NEGATIVE NEGATIVE mg/dL   Protein, ur NEGATIVE NEGATIVE mg/dL   Nitrite NEGATIVE NEGATIVE   Leukocytes, UA NEGATIVE NEGATIVE   RBC / HPF 0-5 0 - 5 RBC/hpf   WBC, UA 0-5 0 - 5 WBC/hpf   Bacteria, UA NONE SEEN NONE SEEN   Squamous Epithelial / LPF 0-5 0 - 5   Pregnancy, urine POC     Status: None   Collection Time: 01/22/18  4:44 PM  Result Value Ref Range   Preg Test, Ur NEGATIVE NEGATIVE  CBC     Status: Abnormal   Collection Time: 01/22/18  5:08 PM  Result Value Ref Range   WBC 11.6 (H) 4.0 - 10.5 K/uL   RBC 4.82 3.87 - 5.11 MIL/uL   Hemoglobin 13.3 12.0 - 15.0 g/dL   HCT 39.5 36.0 - 46.0 %   MCV 82.0 80.0 - 100.0 fL   MCH 27.6 26.0 - 34.0 pg  MCHC 33.7 30.0 - 36.0 g/dL   RDW 13.8 11.5 - 15.5 %   Platelets 351 150 - 400 K/uL   nRBC 0.0 0.0 - 0.2 %  Wet prep, genital     Status: Abnormal   Collection Time: 01/22/18  6:55 PM  Result Value Ref Range   Yeast Wet Prep HPF POC NONE SEEN NONE SEEN   Trich, Wet Prep NONE SEEN NONE SEEN   Clue Cells Wet Prep HPF POC NONE SEEN NONE SEEN   WBC, Wet Prep HPF POC MODERATE (A) NONE SEEN   Sperm NONE SEEN      MAU Course  Procedures  MDM Patient has had toradol. She reports that her cramping is now 0/10.   Assessment and Plan   1. Yeast infection involving the vagina and surrounding area    DC home Comfort measures reviewed  UC pending  RX: diflucan as directed #2. Mycolog cream TID to affected areas  Return to MAU as needed FU with OB as planned  Follow-up Information    Enid Derry, [place], DO Follow up.   Specialty:  Family Medicine Contact information: [address] [place] [address] [place], CNM  01/22/18  7:43 PM

## 2018-01-23 LAB — GC/CHLAMYDIA PROBE AMP (~~LOC~~) NOT AT ARMC
CHLAMYDIA, DNA PROBE: NEGATIVE
Neisseria Gonorrhea: NEGATIVE

## 2018-01-24 LAB — URINE CULTURE: Culture: <10000 — AB

## 2018-05-04 ENCOUNTER — Encounter (HOSPITAL_COMMUNITY): Payer: Self-pay

## 2018-05-04 ENCOUNTER — Other Ambulatory Visit: Payer: Self-pay

## 2018-05-04 ENCOUNTER — Ambulatory Visit (HOSPITAL_COMMUNITY)
Admission: EM | Admit: 2018-05-04 | Discharge: 2018-05-04 | Disposition: A | Discharge status: Home / Self Care | Payer: Medicaid Other | Attending: Family Medicine | Admitting: Family Medicine

## 2018-05-04 DIAGNOSIS — Z3202 Encounter for pregnancy test, result negative: Secondary | ICD-10-CM | POA: Diagnosis not present

## 2018-05-04 DIAGNOSIS — B349 Viral infection, unspecified: Secondary | ICD-10-CM

## 2018-05-04 DIAGNOSIS — Z20828 Contact with and (suspected) exposure to other viral communicable diseases: Secondary | ICD-10-CM | POA: Diagnosis not present

## 2018-05-04 DIAGNOSIS — R509 Fever, unspecified: Secondary | ICD-10-CM

## 2018-05-04 DIAGNOSIS — J029 Acute pharyngitis, unspecified: Secondary | ICD-10-CM | POA: Diagnosis not present

## 2018-05-04 DIAGNOSIS — M791 Myalgia, unspecified site: Secondary | ICD-10-CM | POA: Diagnosis not present

## 2018-05-04 LAB — POCT RAPID STREP A: STREPTOCOCCUS, GROUP A SCREEN (DIRECT): NEGATIVE

## 2018-05-04 LAB — POCT URINALYSIS DIP (DEVICE)
Bilirubin Urine: NEGATIVE
Glucose, UA: NEGATIVE mg/dL
KETONES UR: NEGATIVE mg/dL
LEUKOCYTE UA: NEGATIVE
Nitrite: NEGATIVE
PROTEIN: NEGATIVE mg/dL
SPECIFIC GRAVITY, URINE: 1.02 (ref 1.005–1.030)
UROBILINOGEN UA: 0.2 mg/dL (ref 0.0–1.0)
pH: 6 (ref 5.0–8.0)

## 2018-05-04 LAB — POCT PREGNANCY, URINE: Preg Test, Ur: NEGATIVE

## 2018-05-04 LAB — GLUCOSE, CAPILLARY: GLUCOSE-CAPILLARY: 113 mg/dL — AB (ref 70–99)

## 2018-05-04 NOTE — ED Triage Notes (Signed)
Pt cc sore throat, back pain and fever 4 days. Pt states she  period is late. She wants to do a pregnancy test.

## 2018-05-04 NOTE — Discharge Instructions (Addendum)
Your strep test was negative Urine negative for infection and pregnancy CBG 113 This is most likely a viral illness  You can take OTC medications as needed for pain Follow up as needed for continued or worsening symptoms

## 2018-05-04 NOTE — ED Provider Notes (Signed)
[facility]    CSN: [id number] Arrival date & time: 05/04/18  1008     History   Chief Complaint Chief Complaint  Patient presents with  . Fever    HPI Elizabeth Warren is a 37 y.o. female.    Sore Throat  This is a new problem. The current episode started 2 days ago. The problem occurs constantly. The problem has not changed since onset.Pertinent negatives include no chest pain, no abdominal pain, no headaches and no shortness of breath. The symptoms are aggravated by swallowing. The symptoms are relieved by NSAIDs.    Past Medical History:  Diagnosis Date  . Gestational diabetes   . Hypertension    pregnancy  . NVD (normal vaginal delivery) 09/20/2010  . Pre-existing type 2 diabetes mellitus during pregnancy in second trimester 10/02/2016    Patient Active Problem List   Diagnosis Date Noted  . History of gestational diabetes 09/06/2016  . Obesity (BMI 30-39.9) 10/31/2011  . Vaginal candidiasis 09/16/2006    Past Surgical History:  Procedure Laterality Date  . NO PAST SURGERIES      OB History    Gravida  5   Para  5   Term  5   Preterm  0   AB  0   Living  5     SAB  0   TAB  0   Ectopic  0   Multiple  0   Live Births  5            Home Medications    Prior to Admission medications   Medication Sig Start Date End Date Taking? Authorizing Provider  ACCU-CHEK FASTCLIX LANCETS MISC 1 each by Does not apply route 4 (four) times daily. 10/23/17   Winfrey, Amanda C, MD  blood glucose meter kit and supplies Use as needed to check blood sugar 10/23/17   Winfrey, Amanda C, MD  Cholecalciferol (VITAMIN D3) 400 units tablet Take 2 tablets (800 Units total) by mouth daily. 10/23/17   Winfrey, Amanda C, MD  cyclobenzaprine (FLEXERIL) 10 MG tablet Take 1 tablet (10 mg total) by mouth 2 (two) times daily as needed for muscle spasms. 12/21/17   Haviland, Julie, MD  FENUGREEK PO Take by mouth.    [provider]  ferrous sulfate 325  (65 FE) MG EC tablet Take 325 mg by mouth 3 (three) times daily with meals.    [provider]  glucose blood (ACCU-CHEK GUIDE) test strip Use 1 test strip to check blood glucose 4 times daily Patient not taking: Reported on 05/05/2017 03/13/17   Anyanwu, Ugonna A, MD  ibuprofen (ADVIL,MOTRIN) 600 MG tablet Take 1 tablet (600 mg total) by mouth every 6 (six) hours as needed. 12/21/17   Haviland, Julie, MD  nystatin-triamcinolone (MYCOLOG II) cream Apply topically 3 (three) times daily. Apply to affected area 01/22/18   Hogan, Heather D, CNM  PRENATAL 27-1 MG TABS Take 1 tablet by mouth daily. 12/31/17   Shirley, Jordan, DO    Family History Family History  Problem Relation Age of Onset  . Diabetes Mother   . Hypertension Mother   . Diabetes Father   . Hypertension Father   . Diabetes Maternal Grandmother   . Hypertension Maternal Grandmother   . Diabetes Maternal Grandfather   . Hypertension Maternal Grandfather   . Diabetes Paternal Grandmother   . Hypertension Paternal Grandmother   . Diabetes Paternal Grandfather   . Hypertension Paternal Grandfather   . Cancer Neg   Hx   . Heart disease Neg Hx   . Stroke Neg Hx     Social History Social History   Tobacco Use  . Smoking status: Never Smoker  . Smokeless tobacco: Never Used  Substance Use Topics  . Alcohol use: No  . Drug use: No     Allergies   Patient has no known allergies.   Review of Systems Review of Systems  Constitutional: Positive for fever.  HENT: Positive for sore throat. Negative for congestion, ear pain, postnasal drip, rhinorrhea and trouble swallowing.   Respiratory: Negative for cough, shortness of breath and wheezing.   Cardiovascular: Negative for chest pain and leg swelling.  Gastrointestinal: Negative for abdominal pain.  Musculoskeletal: Positive for myalgias.  Skin: Negative for color change and rash.  Neurological: Negative for headaches.     Physical Exam Triage Vital Signs ED  Triage Vitals  Enc Vitals Group     BP 05/04/18 1055 120/82     Pulse Rate 05/04/18 1055 92     Resp 05/04/18 1055 18     Temp 05/04/18 1055 98.5 F (36.9 C)     Temp Source 05/04/18 1055 Oral     SpO2 05/04/18 1055 99 %     Weight 05/04/18 1057 190 lb (86.2 kg)     Height --      Head Circumference --      Peak Flow --      Pain Score 05/04/18 1057 6     Pain Loc --      Pain Edu? --      Excl. in [place]? --    No data found.  Updated Vital Signs BP 120/82 (BP Location: Left Arm)   Pulse 92   Temp 98.5 F (36.9 C) (Oral)   Resp 18   Wt 190 lb (86.2 kg)   LMP 03/04/2018   SpO2 99%   BMI 37.11 kg/m   Visual Acuity Right Eye Distance:   Left Eye Distance:   Bilateral Distance:    Right Eye Near:   Left Eye Near:    Bilateral Near:     Physical Exam Vitals signs and nursing note reviewed.  Constitutional:      General: She is not in acute distress.    Appearance: Normal appearance. She is not ill-appearing, toxic-appearing or diaphoretic.  HENT:     Head: Normocephalic and atraumatic.     Right Ear: Tympanic membrane and ear canal normal.     Left Ear: Tympanic membrane and ear canal normal.     Nose: Nose normal.     Mouth/Throat:     Pharynx: Oropharynx is clear. Posterior oropharyngeal erythema present.  Eyes:     Conjunctiva/sclera: Conjunctivae normal.  Neck:     Musculoskeletal: Normal range of motion.  Pulmonary:     Effort: Pulmonary effort is normal.  Musculoskeletal: Normal range of motion.  Lymphadenopathy:     Cervical: No cervical adenopathy.  Skin:    General: Skin is warm and dry.  Neurological:     Mental Status: She is alert.  Psychiatric:        Mood and Affect: Mood normal.      UC Treatments / Results  Labs (all labs ordered are listed, but only abnormal results are displayed) Labs Reviewed  GLUCOSE, CAPILLARY - Abnormal; Notable for the following components:      Result Value   Glucose-Capillary 113 (*)    All other  components within normal limits  POCT URINALYSIS   DIP (DEVICE) - Abnormal; Notable for the following components:   Hgb urine dipstick TRACE (*)    All other components within normal limits  POCT PREGNANCY, URINE  CBG MONITORING, ED  POCT RAPID STREP A    EKG None  Radiology No results found.  Procedures Procedures (including critical care time)  Medications Ordered in UC Medications - No data to display  Initial Impression / Assessment and Plan / UC Course  I have reviewed the triage vital signs and the nursing notes.  Pertinent labs & imaging results that were available during my care of the patient were reviewed by me and considered in my medical decision making (see chart for details).    Viral illness  Rapid strep test negative This is most likely a viral illness Urine negative for pregnancy and infection.  CBG 113. Daughter was recently sick with similar symptoms Over-the-counter symptomatic treatment as needed Follow up as needed for continued or worsening symptoms    Final Clinical Impressions(s) / UC Diagnoses   Final diagnoses:  Viral illness     Discharge Instructions     Your strep test was negative Urine negative for infection and pregnancy CBG 113 This is most likely a viral illness  You can take OTC medications as needed for pain Follow up as needed for continued or worsening symptoms     ED Prescriptions    None     Controlled Substance Prescriptions [place] Controlled Substance Registry consulted? Not Applicable   Bast, Traci A, NP 05/04/18 1144  

## 2018-05-11 ENCOUNTER — Other Ambulatory Visit: Payer: Self-pay | Admitting: Family Medicine

## 2018-05-11 MED ORDER — PRENATAL 27-1 MG PO TABS: 1.0000 | ORAL_TABLET | Freq: Every day | ORAL | 1 refills | Status: DC

## 2018-05-11 NOTE — Telephone Encounter (Signed)
Have refilled patients prenatal vitamins.

## 2018-05-11 NOTE — Telephone Encounter (Signed)
Patient called to get refill on her PREPLUS vitamin. Please give patient a call back.

## 2018-06-02 ENCOUNTER — Encounter (HOSPITAL_COMMUNITY): Payer: Self-pay | Admitting: Emergency Medicine

## 2018-06-02 ENCOUNTER — Other Ambulatory Visit: Payer: Self-pay

## 2018-06-02 ENCOUNTER — Ambulatory Visit (HOSPITAL_COMMUNITY)
Admission: EM | Admit: 2018-06-02 | Discharge: 2018-06-02 | Disposition: A | Discharge status: Home / Self Care | Payer: Medicaid Other | Attending: Family Medicine | Admitting: Family Medicine

## 2018-06-02 DIAGNOSIS — N760 Acute vaginitis: Secondary | ICD-10-CM

## 2018-06-02 LAB — POCT URINALYSIS DIP (DEVICE)
Bilirubin Urine: NEGATIVE
Glucose, UA: NEGATIVE mg/dL
Ketones, ur: NEGATIVE mg/dL
Leukocytes,Ua: NEGATIVE
Nitrite: NEGATIVE
Protein, ur: NEGATIVE mg/dL
Specific Gravity, Urine: 1.02 (ref 1.005–1.030)
Urobilinogen, UA: 0.2 mg/dL (ref 0.0–1.0)
pH: 6 (ref 5.0–8.0)

## 2018-06-02 LAB — POCT PREGNANCY, URINE: Preg Test, Ur: NEGATIVE

## 2018-06-02 MED ORDER — NYSTATIN 100000 UNIT/GM EX CREA: TOPICAL_CREAM | CUTANEOUS | 0 refills | Status: DC

## 2018-06-02 MED ORDER — FLUCONAZOLE 150 MG PO TABS: 150.0000 mg | ORAL_TABLET | Freq: Every day | ORAL | 0 refills | Status: DC

## 2018-06-02 NOTE — ED Provider Notes (Addendum)
[place]    CSN: [id number] Arrival date & time: 06/02/18  0957     History   Chief Complaint Chief Complaint  Patient presents with  . Dysuria  . Vaginal Discharge    HPI Elizabeth Warren is a 38 y.o. female.   Patient is a 38 year old female who presents today with vaginal discharge, itching and irritation.  She has also has some mild dysuria.  This has been constant and worsening over the past week.  She has had some skin color changes in her perineal area.  She tried some over-the-counter vaginal cream without any relief.  Reports that her symptoms have worsened.  She denies any associated abdominal pain, back pain, fevers, chills, body aches, night sweats.  She is currently sexually active with her husband and denies any concern for STDs.  ROS per HPI      Past Medical History:  Diagnosis Date  . Gestational diabetes   . Hypertension    pregnancy  . NVD (normal vaginal delivery) 09/20/2010  . Pre-existing type 2 diabetes mellitus during pregnancy in second trimester 10/02/2016    Patient Active Problem List   Diagnosis Date Noted  . History of gestational diabetes 09/06/2016  . Obesity (BMI 30-39.9) 10/31/2011  . Vaginal candidiasis 09/16/2006    Past Surgical History:  Procedure Laterality Date  . NO PAST SURGERIES      OB History    Gravida  5   Para  5   Term  5   Preterm  0   AB  0   Living  5     SAB  0   TAB  0   Ectopic  0   Multiple  0   Live Births  5            Home Medications    Prior to Admission medications   Medication Sig Start Date End Date Taking? Authorizing Provider  ACCU-CHEK FASTCLIX LANCETS MISC 1 each by Does not apply route 4 (four) times daily. 10/23/17   Kathrene Alu, MD  blood glucose meter kit and supplies Use as needed to check blood sugar 10/23/17   Kathrene Alu, MD  Cholecalciferol (VITAMIN D3) 400 units tablet Take 2 tablets (800 Units total) by mouth daily. 10/23/17   Kathrene Alu, MD  cyclobenzaprine (FLEXERIL) 10 MG tablet Take 1 tablet (10 mg total) by mouth 2 (two) times daily as needed for muscle spasms. 12/21/17   Isla Pence, MD  FENUGREEK PO Take by mouth.    [provider]  ferrous sulfate 325 (65 FE) MG EC tablet Take 325 mg by mouth 3 (three) times daily with meals.    [provider]  fluconazole (DIFLUCAN) 150 MG tablet Take 1 tablet (150 mg total) by mouth daily. 06/02/18   Loura Halt A, NP  glucose blood (ACCU-CHEK GUIDE) test strip Use 1 test strip to check blood glucose 4 times daily Patient not taking: Reported on 05/05/2017 03/13/17   Anyanwu, Sallyanne Havers, MD  ibuprofen (ADVIL,MOTRIN) 600 MG tablet Take 1 tablet (600 mg total) by mouth every 6 (six) hours as needed. 12/21/17   Isla Pence, MD  nystatin cream (MYCOSTATIN) Apply to affected area 2 times daily 06/02/18   Loura Halt A, NP  nystatin-triamcinolone (MYCOLOG II) cream Apply topically 3 (three) times daily. Apply to affected area 01/22/18   Marcille Buffy D, CNM  Prenatal 27-1 MG TABS Take 1 tablet by mouth daily. 05/11/18  Shirley, [place], DO    Family History Family History  Problem Relation Age of Onset  . Diabetes Mother   . Hypertension Mother   . Diabetes Father   . Hypertension Father   . Diabetes Maternal Grandmother   . Hypertension Maternal Grandmother   . Diabetes Maternal Grandfather   . Hypertension Maternal Grandfather   . Diabetes Paternal Grandmother   . Hypertension Paternal Grandmother   . Diabetes Paternal Grandfather   . Hypertension Paternal Grandfather   . Cancer Neg Hx   . Heart disease Neg Hx   . Stroke Neg Hx     Social History Social History   Tobacco Use  . Smoking status: Never Smoker  . Smokeless tobacco: Never Used  Substance Use Topics  . Alcohol use: No  . Drug use: No     Allergies   Patient has no known allergies.   Review of Systems Review of Systems   Physical Exam Triage Vital Signs ED Triage  Vitals  Enc Vitals Group     BP 06/02/18 1012 125/79     Pulse Rate 06/02/18 1012 94     Resp 06/02/18 1012 16     Temp 06/02/18 1012 98.4 F (36.9 C)     Temp Source 06/02/18 1012 Oral     SpO2 06/02/18 1012 98 %     Weight --      Height --      Head Circumference --      Peak Flow --      Pain Score 06/02/18 1017 6     Pain Loc --      Pain Edu? --      Excl. in [place]? --    No data found.  Updated Vital Signs BP 125/79 (BP Location: Left Arm)   Pulse 94   Temp 98.4 F (36.9 C) (Oral)   Resp 16   SpO2 98%   Visual Acuity Right Eye Distance:   Left Eye Distance:   Bilateral Distance:    Right Eye Near:   Left Eye Near:    Bilateral Near:     Physical Exam Vitals signs and nursing note reviewed.  Constitutional:      General: She is not in acute distress.    Appearance: Normal appearance. She is not ill-appearing, toxic-appearing or diaphoretic.  HENT:     Head: Normocephalic.     Nose: Nose normal.     Mouth/Throat:     Pharynx: Oropharynx is clear.  Eyes:     Conjunctiva/sclera: Conjunctivae normal.  Neck:     Musculoskeletal: Normal range of motion.  Pulmonary:     Effort: Pulmonary effort is normal.  Abdominal:     Palpations: Abdomen is soft.     Tenderness: There is no abdominal tenderness.  Genitourinary:    Comments: External vaginal area erythematous and mildly swollen. No obvious discharge.  Perineum with raised confluent rash that is hyperpigmented and erythematous.   Internal exam deferred Musculoskeletal: Normal range of motion.  Skin:    General: Skin is warm and dry.     Findings: No rash.  Neurological:     Mental Status: She is alert.  Psychiatric:        Mood and Affect: Mood normal.      UC Treatments / Results  Labs (all labs ordered are listed, but only abnormal results are displayed) Labs Reviewed  POCT URINALYSIS DIP (DEVICE) - Abnormal; Notable for the following components:      Result  Value   Hgb urine dipstick  TRACE (*)    All other components within normal limits  POC URINE PREG, ED  POCT PREGNANCY, URINE    EKG None  Radiology No results found.  Procedures Procedures (including critical care time)  Medications Ordered in UC Medications - No data to display  Initial Impression / Assessment and Plan / UC Course  I have reviewed the triage vital signs and the nursing notes.  Pertinent labs & imaging results that were available during my care of the patient were reviewed by me and considered in my medical decision making (see chart for details).    Vaginitis  Treating for yeast infection with nystatin cream and diflucan based on extent of rash and exam.  Urine negative for infection.  No concern for STDs Follow up as needed for continued or worsening symptoms   Final Clinical Impressions(s) / UC Diagnoses   Final diagnoses:  Acute vaginitis     Discharge Instructions     We are treating you for yeast infection Take the medication as prescribed    ED Prescriptions    Medication Sig Dispense Auth. Provider   nystatin cream (MYCOSTATIN) Apply to affected area 2 times daily 30 g Peri Kreft A, NP   fluconazole (DIFLUCAN) 150 MG tablet Take 1 tablet (150 mg total) by mouth daily. 2 tablet Loura Halt A, NP     Controlled Substance Prescriptions [place] Controlled Substance Registry consulted? Not Applicable       Orvan July, NP 06/02/18 1245

## 2018-06-02 NOTE — ED Triage Notes (Signed)
Pt here for UTI sx and vaginal discharge and irritation

## 2018-06-02 NOTE — Discharge Instructions (Addendum)
We are treating you for yeast infection Take the medication as prescribed

## 2018-08-06 ENCOUNTER — Other Ambulatory Visit: Payer: Self-pay

## 2018-08-06 ENCOUNTER — Encounter (HOSPITAL_COMMUNITY): Payer: Self-pay | Admitting: *Deleted

## 2018-08-06 ENCOUNTER — Ambulatory Visit (HOSPITAL_COMMUNITY)
Admission: EM | Admit: 2018-08-06 | Discharge: 2018-08-06 | Disposition: A | Discharge status: Home / Self Care | Payer: Medicaid Other | Attending: Internal Medicine | Admitting: Internal Medicine

## 2018-08-06 DIAGNOSIS — Z8632 Personal history of gestational diabetes: Secondary | ICD-10-CM

## 2018-08-06 DIAGNOSIS — R197 Diarrhea, unspecified: Secondary | ICD-10-CM

## 2018-08-06 DIAGNOSIS — R631 Polydipsia: Secondary | ICD-10-CM

## 2018-08-06 DIAGNOSIS — R350 Frequency of micturition: Secondary | ICD-10-CM | POA: Diagnosis not present

## 2018-08-06 LAB — TSH: TSH: 1.323 u[IU]/mL (ref 0.350–4.500)

## 2018-08-06 LAB — COMPREHENSIVE METABOLIC PANEL
ALT: 43 U/L (ref 0–44)
AST: 25 U/L (ref 15–41)
Albumin: 3.7 g/dL (ref 3.5–5.0)
Alkaline Phosphatase: 110 U/L (ref 38–126)
Anion gap: 12 (ref 5–15)
BUN: 16 mg/dL (ref 6–20)
CO2: 21 mmol/L — ABNORMAL LOW (ref 22–32)
Calcium: 9.2 mg/dL (ref 8.9–10.3)
Chloride: 103 mmol/L (ref 98–111)
Creatinine, Ser: 0.65 mg/dL (ref 0.44–1.00)
GFR calc Af Amer: >60 mL/min (ref >60)
GFR calc non Af Amer: >60 mL/min (ref >60)
Glucose, Bld: 94 mg/dL (ref 70–99)
Potassium: 3.6 mmol/L (ref 3.5–5.1)
Sodium: 136 mmol/L (ref 135–145)
Total Bilirubin: 0.3 mg/dL (ref 0.3–1.2)
Total Protein: 7.5 g/dL (ref 6.5–8.1)

## 2018-08-06 LAB — GLUCOSE, CAPILLARY: Glucose-Capillary: 91 mg/dL (ref 70–99)

## 2018-08-06 LAB — CBC
HCT: 38.3 % (ref 36.0–46.0)
Hemoglobin: 12.8 g/dL (ref 12.0–15.0)
MCH: 26.6 pg (ref 26.0–34.0)
MCHC: 33.4 g/dL (ref 30.0–36.0)
MCV: 79.6 fL — ABNORMAL LOW (ref 80.0–100.0)
Platelets: 344 10*3/uL (ref 150–400)
RBC: 4.81 MIL/uL (ref 3.87–5.11)
RDW: 14.4 % (ref 11.5–15.5)
WBC: 12.3 10*3/uL — ABNORMAL HIGH (ref 4.0–10.5)
nRBC: 0 % (ref 0.0–0.2)

## 2018-08-06 LAB — POCT URINALYSIS DIP (DEVICE)
Bilirubin Urine: NEGATIVE
Glucose, UA: NEGATIVE mg/dL
Ketones, ur: NEGATIVE mg/dL
Leukocytes,Ua: NEGATIVE
Nitrite: NEGATIVE
Protein, ur: NEGATIVE mg/dL
Specific Gravity, Urine: >=1.03 (ref 1.005–1.030)
Urobilinogen, UA: 0.2 mg/dL (ref 0.0–1.0)
pH: 5.5 (ref 5.0–8.0)

## 2018-08-06 LAB — T4, FREE: Free T4: 0.69 ng/dL (ref 0.61–1.12)

## 2018-08-06 MED ORDER — MELATONIN 3 MG PO CAPS: 3.0000 mg | ORAL_CAPSULE | Freq: Every day | ORAL | 1 refills | Status: DC

## 2018-08-06 MED ORDER — ONDANSETRON 4 MG PO TBDP: 4.0000 mg | ORAL_TABLET | Freq: Three times a day (TID) | ORAL | 0 refills | Status: DC | PRN

## 2018-08-06 NOTE — ED Provider Notes (Addendum)
[place]    CSN: [id number] Arrival date & time: 08/06/18  1725      History   Chief Complaint Chief Complaint  Patient presents with   Diarrhea    HPI Elizabeth Warren is a 38 y.o. female with a history of gestational diabetes comes to urgent care with complaints of watery diarrhea for a week duration.  Patient is having 20 watery bowel movements daily.  No abdominal pain.  No nausea or vomiting.  She complains of frequent micturition with increased thirst, progressive fatigue and weight loss over the past week.  No shortness of breath, cough or sputum production.  She had gestational diabetes and relied on insulin during the pregnancy but she is currently not on any medication.  She has a 88 and half-year-old who had diarrhea about a week and a half ago.  Diarrhea lasted for 10 days. HPI  Past Medical History:  Diagnosis Date   Gestational diabetes    Hypertension    pregnancy   NVD (normal vaginal delivery) 09/20/2010   Pre-existing type 2 diabetes mellitus during pregnancy in second trimester 10/02/2016    Patient Active Problem List   Diagnosis Date Noted   History of gestational diabetes 09/06/2016   Obesity (BMI 30-39.9) 10/31/2011   Vaginal candidiasis 09/16/2006    Past Surgical History:  Procedure Laterality Date   NO PAST SURGERIES      OB History    Gravida  5   Para  5   Term  5   Preterm  0   AB  0   Living  5     SAB  0   TAB  0   Ectopic  0   Multiple  0   Live Births  5            Home Medications    Prior to Admission medications   Medication Sig Start Date End Date Taking? Authorizing Provider  Cholecalciferol (VITAMIN D3) 400 units tablet Take 2 tablets (800 Units total) by mouth daily. 10/23/17  Yes Winfrey, Alcario Drought, MD  ferrous sulfate 325 (65 FE) MG EC tablet Take 325 mg by mouth 3 (three) times daily with meals.   Yes [provider]  Prenatal 27-1 MG TABS Take 1 tablet by mouth daily.  05/11/18  Yes Enid Derry, [place], DO  ACCU-CHEK FASTCLIX LANCETS MISC 1 each by Does not apply route 4 (four) times daily. 10/23/17   Kathrene Alu, MD  blood glucose meter kit and supplies Use as needed to check blood sugar 10/23/17   Winfrey, Alcario Drought, MD  cyclobenzaprine (FLEXERIL) 10 MG tablet Take 1 tablet (10 mg total) by mouth 2 (two) times daily as needed for muscle spasms. 12/21/17   Isla Pence, MD  FENUGREEK PO Take by mouth.    [provider]  fluconazole (DIFLUCAN) 150 MG tablet Take 1 tablet (150 mg total) by mouth daily. 06/02/18   Loura Halt A, NP  glucose blood (ACCU-CHEK GUIDE) test strip Use 1 test strip to check blood glucose 4 times daily Patient not taking: Reported on 05/05/2017 03/13/17   Anyanwu, Sallyanne Havers, MD  ibuprofen (ADVIL,MOTRIN) 600 MG tablet Take 1 tablet (600 mg total) by mouth every 6 (six) hours as needed. 12/21/17   Isla Pence, MD  nystatin cream (MYCOSTATIN) Apply to affected area 2 times daily 06/02/18   Loura Halt A, NP  nystatin-triamcinolone (MYCOLOG II) cream Apply topically 3 (three) times daily. Apply to affected area 01/22/18  Tresea Mall, CNM    Family History Family History  Problem Relation Age of Onset   Diabetes Mother    Hypertension Mother    Diabetes Father    Hypertension Father    Diabetes Maternal Grandmother    Hypertension Maternal Grandmother    Diabetes Maternal Grandfather    Hypertension Maternal Grandfather    Diabetes Paternal Grandmother    Hypertension Paternal Grandmother    Diabetes Paternal Grandfather    Hypertension Paternal Grandfather    Cancer Neg Hx    Heart disease Neg Hx    Stroke Neg Hx     Social History Social History   Tobacco Use   Smoking status: Never Smoker   Smokeless tobacco: Never Used  Substance Use Topics   Alcohol use: No   Drug use: No     Allergies   Patient has no known allergies.   Review of Systems Review of Systems  Constitutional:  Positive for activity change, appetite change and fatigue. Negative for chills and fever.  HENT: Negative.   Respiratory: Negative.   Gastrointestinal: Positive for diarrhea. Negative for abdominal distention, abdominal pain, nausea and vomiting.  Endocrine: Negative.   Genitourinary: Positive for frequency. Negative for difficulty urinating, dyspareunia, dysuria, enuresis, genital sores, menstrual problem, pelvic pain, urgency, vaginal discharge and vaginal pain.  Musculoskeletal: Negative.   Skin: Negative.   Neurological: Negative for dizziness, syncope, light-headedness and headaches.     Physical Exam Triage Vital Signs ED Triage Vitals  Enc Vitals Group     BP 08/06/18 1836 (!) 128/59     Pulse --      Resp 08/06/18 1836 16     Temp 08/06/18 1836 98.5 F (36.9 C)     Temp Source 08/06/18 1836 Temporal     SpO2 08/06/18 1836 100 %     Weight 08/06/18 1855 200 lb (90.7 kg)     Height --      Head Circumference --      Peak Flow --      Pain Score 08/06/18 1836 0     Pain Loc --      Pain Edu? --      Excl. in [place]? --    Orthostatic VS for the past 24 hrs:  BP- Lying Pulse- Lying BP- Sitting Pulse- Sitting BP- Standing at 0 minutes Pulse- Standing at 0 minutes  08/06/18 1839 133/82 102 (!) 123/93 112 (!) 139/91 115    Updated Vital Signs BP (!) 128/59    Temp 98.5 F (36.9 C) (Temporal)    Resp 16    Wt 90.7 kg    LMP 07/23/2018 (Approximate) Comment: Has IUD in place   SpO2 100%    Breastfeeding No    BMI 39.06 kg/m   Visual Acuity Right Eye Distance:   Left Eye Distance:   Bilateral Distance:    Right Eye Near:   Left Eye Near:    Bilateral Near:     Physical Exam Vitals signs and nursing note reviewed.  Constitutional:      General: She is not in acute distress.    Appearance: Normal appearance. She is not ill-appearing or toxic-appearing.  Neck:     Musculoskeletal: Normal range of motion.  Cardiovascular:     Rate and Rhythm: Tachycardia present.    Pulmonary:     Effort: Pulmonary effort is normal. No respiratory distress.     Breath sounds: Normal breath sounds. No wheezing, rhonchi or rales.  Abdominal:  General: Bowel sounds are normal. There is no distension.     Palpations: Abdomen is soft.     Tenderness: There is no abdominal tenderness. There is no guarding.  Musculoskeletal:        General: No swelling, tenderness, deformity or signs of injury.  Skin:    General: Skin is warm.     Capillary Refill: Capillary refill takes less than 2 seconds.  Neurological:     General: No focal deficit present.     Mental Status: She is alert and oriented to person, place, and time. Mental status is at baseline.      UC Treatments / Results  Labs (all labs ordered are listed, but only abnormal results are displayed) Labs Reviewed  COMPREHENSIVE METABOLIC PANEL  CBC  CBG MONITORING, ED    EKG   Radiology No results found.  Procedures Procedures (including critical care time)  Medications Ordered in UC Medications - No data to display  Initial Impression / Assessment and Plan / UC Course  I have reviewed the triage vital signs and the nursing notes.  Pertinent labs & imaging results that were available during my care of the patient were reviewed by me and considered in my medical decision making (see chart for details).     1.  Polyuria, polydipsia and weight loss: CBC, BMP, TSH and free T4 Point-of-care blood glucose is 91  2.  Acute gastroenteritis likely viral: Patient may be mildly dehydrated. Zofran as needed Patient is advised to drink electrolyte balanced fluids. Labs ordered.  Final Clinical Impressions(s) / UC Diagnoses   Final diagnoses:  None   Discharge Instructions   None    ED Prescriptions    None     Controlled Substance Prescriptions [place] Controlled Substance Registry consulted? No   Chase Picket, MD 08/06/18 [place] Register    Chase Picket, MD 08/06/18 [phone]

## 2018-08-06 NOTE — ED Triage Notes (Signed)
Pt c/o watery diarrhea x 1 wk - describes approx 20x per day.  Denies fevers, but describes experiencing diaphoresis frequently.  Also c/o polyuria, weight loss, fatigue over past week.  Denies any pain at this time.

## 2018-11-06 ENCOUNTER — Other Ambulatory Visit: Payer: Self-pay

## 2018-11-06 ENCOUNTER — Ambulatory Visit (HOSPITAL_COMMUNITY)
Admission: EM | Admit: 2018-11-06 | Discharge: 2018-11-06 | Disposition: A | Discharge status: Home / Self Care | Payer: Medicaid Other | Attending: Physician Assistant | Admitting: Physician Assistant

## 2018-11-06 ENCOUNTER — Encounter (HOSPITAL_COMMUNITY): Payer: Self-pay | Admitting: Emergency Medicine

## 2018-11-06 DIAGNOSIS — R21 Rash and other nonspecific skin eruption: Secondary | ICD-10-CM | POA: Diagnosis not present

## 2018-11-06 DIAGNOSIS — Z3202 Encounter for pregnancy test, result negative: Secondary | ICD-10-CM

## 2018-11-06 DIAGNOSIS — B3749 Other urogenital candidiasis: Secondary | ICD-10-CM

## 2018-11-06 LAB — POCT URINALYSIS DIP (DEVICE)
Bilirubin Urine: NEGATIVE
Glucose, UA: NEGATIVE mg/dL
Ketones, ur: NEGATIVE mg/dL
Leukocytes,Ua: NEGATIVE
Nitrite: NEGATIVE
Protein, ur: NEGATIVE mg/dL
Specific Gravity, Urine: >=1.03 (ref 1.005–1.030)
Urobilinogen, UA: 0.2 mg/dL (ref 0.0–1.0)
pH: 6 (ref 5.0–8.0)

## 2018-11-06 LAB — POCT PREGNANCY, URINE: Preg Test, Ur: NEGATIVE

## 2018-11-06 MED ORDER — FLUCONAZOLE 150 MG PO TABS: 150.0000 mg | ORAL_TABLET | Freq: Every day | ORAL | 1 refills | Status: DC

## 2018-11-06 MED ORDER — NYSTATIN-TRIAMCINOLONE 100000-0.1 UNIT/GM-% EX CREA: TOPICAL_CREAM | CUTANEOUS | 0 refills | Status: DC

## 2018-11-06 NOTE — Discharge Instructions (Addendum)
Your urine was clear of bacteria so if the symptoms are the same will treat you with same regimen you had before. The cream is also the same. FU as needed.

## 2018-11-06 NOTE — ED Triage Notes (Signed)
Pt here for UTI sx with hx of same

## 2018-11-06 NOTE — ED Provider Notes (Signed)
[facility]    CSN: [id number] Arrival date & time: 11/06/18  1809      History   Chief Complaint No chief complaint on file.   HPI CATARINA HUNTLEY is a 38 y.o. female.   Presents with 3 days of urinary frequency and hesitancy and some mild blood in her urine. She denies vaginal discharge though some mild itching. No fevers or abdominal pain, but mild flank pain is noted.  She reports same sx's as when she was here before and the medication helped her. She also has a return of red rash that she had before and would like a cream refill if possible.      Past Medical History:  Diagnosis Date  . Gestational diabetes   . Hypertension    pregnancy  . NVD (normal vaginal delivery) 09/20/2010  . Pre-existing type 2 diabetes mellitus during pregnancy in second trimester 10/02/2016    Patient Active Problem List   Diagnosis Date Noted  . History of gestational diabetes 09/06/2016  . Obesity (BMI 30-39.9) 10/31/2011  . Vaginal candidiasis 09/16/2006    Past Surgical History:  Procedure Laterality Date  . NO PAST SURGERIES      OB History    Gravida  5   Para  5   Term  5   Preterm  0   AB  0   Living  5     SAB  0   TAB  0   Ectopic  0   Multiple  0   Live Births  5            Home Medications    Prior to Admission medications   Medication Sig Start Date End Date Taking? Authorizing Provider  Cholecalciferol (VITAMIN D3) 400 units tablet Take 2 tablets (800 Units total) by mouth daily. 10/23/17   Lennox Solders, MD  ferrous sulfate 325 (65 FE) MG EC tablet Take 325 mg by mouth 3 (three) times daily with meals.    [provider]  Melatonin 3 MG CAPS Take 1 capsule (3 mg total) by mouth at bedtime. 08/06/18   LampteyBritta Mccreedy, MD  Prenatal 27-1 MG TABS Take 1 tablet by mouth daily. 05/11/18   Shirley, [place], DO    Family History Family History  Problem Relation Age of Onset  . Diabetes Mother   . Hypertension Mother   .  Diabetes Father   . Hypertension Father   . Diabetes Maternal Grandmother   . Hypertension Maternal Grandmother   . Diabetes Maternal Grandfather   . Hypertension Maternal Grandfather   . Diabetes Paternal Grandmother   . Hypertension Paternal Grandmother   . Diabetes Paternal Grandfather   . Hypertension Paternal Grandfather   . Cancer Neg Hx   . Heart disease Neg Hx   . Stroke Neg Hx     Social History Social History   Tobacco Use  . Smoking status: Never Smoker  . Smokeless tobacco: Never Used  Substance Use Topics  . Alcohol use: No  . Drug use: No     Allergies   Patient has no known allergies.   Review of Systems Review of Systems  Constitutional: Negative for fatigue and fever.  Gastrointestinal: Negative for abdominal distention, abdominal pain, nausea and vomiting.  Genitourinary: Positive for flank pain and urgency. Negative for difficulty urinating.  Skin: Positive for rash.  Psychiatric/Behavioral: Negative.      Physical Exam Triage Vital Signs ED Triage Vitals  Enc Vitals Group  BP      Pulse      Resp      Temp      Temp src      SpO2      Weight      Height      Head Circumference      Peak Flow      Pain Score      Pain Loc      Pain Edu?      Excl. in [place]?    No data found.  Updated Vital Signs There were no vitals taken for this visit.  Visual Acuity Right Eye Distance:   Left Eye Distance:   Bilateral Distance:    Right Eye Near:   Left Eye Near:    Bilateral Near:     Physical Exam Vitals signs and nursing note reviewed.  Constitutional:      General: She is not in acute distress.    Appearance: Normal appearance.  HENT:     Head: Normocephalic and atraumatic.  Eyes:     General: No scleral icterus. Pulmonary:     Effort: Pulmonary effort is normal.  Abdominal:     General: Abdomen is flat.     Tenderness: There is no abdominal tenderness. There is no right CVA tenderness, left CVA tenderness or guarding.   Skin:    General: Skin is warm and dry.  Neurological:     General: No focal deficit present.     Mental Status: She is alert.  Psychiatric:        Mood and Affect: Mood normal.        Behavior: Behavior normal.      UC Treatments / Results  Labs (all labs ordered are listed, but only abnormal results are displayed) Labs Reviewed - No data to display  EKG   Radiology No results found.  Procedures Procedures (including critical care time)  Medications Ordered in UC Medications - No data to display  Initial Impression / Assessment and Plan / UC Course  I have reviewed the triage vital signs and the nursing notes.  Pertinent labs & imaging results that were available during my care of the patient were reviewed by me and considered in my medical decision making (see chart for details).     Patient presented with same symptoms as seen before. Elected to treat her with Diflucan and Nystatin. Urine clear for lueks.  Final Clinical Impressions(s) / UC Diagnoses   Final diagnoses:  None   Discharge Instructions   None    ED Prescriptions    None     PDMP not reviewed this encounter.   Bjorn Pippin, [place] 11/06/18 1844

## 2018-11-10 ENCOUNTER — Ambulatory Visit (INDEPENDENT_AMBULATORY_CARE_PROVIDER_SITE_OTHER): Payer: Medicaid Other | Admitting: Family Medicine

## 2018-11-10 ENCOUNTER — Other Ambulatory Visit: Payer: Self-pay

## 2018-11-10 VITALS — BP 118/76 | HR 105 | Wt 202.0 lb

## 2018-11-10 DIAGNOSIS — R399 Unspecified symptoms and signs involving the genitourinary system: Secondary | ICD-10-CM

## 2018-11-10 DIAGNOSIS — E669 Obesity, unspecified: Secondary | ICD-10-CM | POA: Diagnosis not present

## 2018-11-10 DIAGNOSIS — N3001 Acute cystitis with hematuria: Secondary | ICD-10-CM

## 2018-11-10 DIAGNOSIS — N390 Urinary tract infection, site not specified: Secondary | ICD-10-CM | POA: Insufficient documentation

## 2018-11-10 LAB — POCT UA - MICROSCOPIC ONLY

## 2018-11-10 LAB — POCT URINALYSIS DIP (MANUAL ENTRY)
Bilirubin, UA: NEGATIVE
Glucose, UA: NEGATIVE mg/dL
Ketones, POC UA: NEGATIVE mg/dL
Leukocytes, UA: NEGATIVE
Nitrite, UA: NEGATIVE
Protein Ur, POC: 30 mg/dL — AB
Spec Grav, UA: 1.02 (ref 1.010–1.025)
Urobilinogen, UA: 0.2 E.U./dL
pH, UA: 6 (ref 5.0–8.0)

## 2018-11-10 MED ORDER — IBUPROFEN 600 MG PO TABS: 600.0000 mg | ORAL_TABLET | Freq: Three times a day (TID) | ORAL | 0 refills | Status: DC | PRN

## 2018-11-10 MED ORDER — PRENATAL 27-1 MG PO TABS: 1.0000 | ORAL_TABLET | Freq: Every day | ORAL | 1 refills | Status: DC

## 2018-11-10 MED ORDER — CEPHALEXIN 500 MG PO CAPS: 500.0000 mg | ORAL_CAPSULE | Freq: Three times a day (TID) | ORAL | 0 refills | Status: DC

## 2018-11-10 NOTE — Assessment & Plan Note (Addendum)
Few day history of dysuria, urinary frequency, and suprapubic tenderness that feels identical to previous UTI, suspect acute cystitis.  U/a showing trace microscopic hematuria without any significant signs of infection, however will send for culture.  Without fever or CVA tenderness, low suspicion for pyelonephritis and no concern for renal stones despite hematuria given symptomatology. Recently seen in the ED for similar complaints, had negative urinary pregnancy test at that time and has IUD in place. -Urine culture -Keflex 500 mg 3 times daily for 7 days - Follow-up if symptoms not improving or sooner if worsening

## 2018-11-10 NOTE — Assessment & Plan Note (Addendum)
Attempted to collect hemoglobin A1c today, however patient would like to postpone to get to work.  Other than elevated BMI and previous history of gestational diabetes, no concerning symptomatology suggestive of diabetes.  Recommend obtaining this on follow-up.

## 2018-11-10 NOTE — Patient Instructions (Signed)
It was wonderful meeting you today.  I have sent in an antibiotic to help with your urine symptoms.  If your symptoms do not get any better, you develop fever, or worsening back pain, fatigue--please make sure you follow-up with our clinic or go to the ED.   I have also sent in some ibuprofen for your back pain and multivitamin.

## 2018-11-10 NOTE — Progress Notes (Signed)
   Subjective:    Patient ID: Elizabeth Warren, female    DOB: Sep 26, 1980, 38 y.o.   MRN: [id number]   CC: "UTI"   HPI: [name] is a 38 year old female with elevated BMI and history of gestational diabetes presenting discuss the following:  Urinary symptoms: Approximate 4-5-day history of dysuria, urinary frequency, suprapubic tenderness.  Feels identical to previous UTI in the past per her report, received antibiotic with resolution.  Denies any concurrent fever, gross hematuria, severe flank pain, fatigue, polydipsia.  Does endorse some back pain with walking that is chronic in nature.  She was recently seen in the ED on 10/2 for the same symptoms and rash, given Diflucan and nystatin.  She had no resolution in symptoms with Diflucan.  Denies any vaginal itching, new discharge.  Sexually active with her husband, has IUD in place.  Has a 72-month-old son, just recently stopped breast-feeding.  Of note, had a history of gestational diabetes.  Additionally with elevated BMI and previous A1c in prediabetic range (5.8), will recheck A1c today.  Last A1c in 05/2017 which was 5.4.   Smoking status reviewed  Review of Systems Per HPI    Objective:  BP 118/76   Pulse (!) 105   Wt 202 lb (91.6 kg)   LMP 11/05/2018   SpO2 98%   BMI 39.45 kg/m  Vitals and nursing note reviewed  General: NAD, pleasant Cardiac: RRR, normal heart sounds, no murmurs Respiratory: CTAB, normal effort Abdomen: soft, tender to suprapubic region, nondistended, no CVA tenderness bilaterally.   Skin: warm and dry Neuro: alert and oriented, no focal deficits Psych: normal affect  Assessment & Plan:   UTI (urinary tract infection) Few day history of dysuria, urinary frequency, and suprapubic tenderness that feels identical to previous UTI, suspect acute cystitis.  U/a showing trace microscopic hematuria without any significant signs of infection, however will send for culture.  Without fever or CVA tenderness, low  suspicion for pyelonephritis and no concern for renal stones despite hematuria given symptomatology. Recently seen in the ED for similar complaints, had negative urinary pregnancy test at that time and has IUD in place. -Urine culture -Keflex 500 mg 3 times daily for 7 days - Follow-up if symptoms not improving or sooner if worsening  Obesity (BMI 30-39.9) Attempted to collect hemoglobin A1c today, however patient would like to postpone to get to work.  Other than elevated BMI and previous history of gestational diabetes, no concerning symptomatology suggestive of diabetes.  Recommend obtaining this on follow-up.   Follow-up if symptoms not improving or sooner if worsening  [place] Resident PGY-2

## 2018-11-12 LAB — URINE CULTURE

## 2018-11-17 ENCOUNTER — Other Ambulatory Visit: Payer: Self-pay

## 2018-11-17 ENCOUNTER — Encounter (HOSPITAL_COMMUNITY): Payer: Self-pay | Admitting: Family Medicine

## 2018-11-17 ENCOUNTER — Ambulatory Visit (HOSPITAL_COMMUNITY)
Admission: EM | Admit: 2018-11-17 | Discharge: 2018-11-17 | Disposition: A | Discharge status: Home / Self Care | Payer: Medicaid Other | Attending: Family Medicine | Admitting: Family Medicine

## 2018-11-17 DIAGNOSIS — Z3202 Encounter for pregnancy test, result negative: Secondary | ICD-10-CM

## 2018-11-17 DIAGNOSIS — R350 Frequency of micturition: Secondary | ICD-10-CM | POA: Insufficient documentation

## 2018-11-17 DIAGNOSIS — M545 Low back pain, unspecified: Secondary | ICD-10-CM

## 2018-11-17 DIAGNOSIS — N898 Other specified noninflammatory disorders of vagina: Secondary | ICD-10-CM | POA: Diagnosis not present

## 2018-11-17 LAB — POCT URINALYSIS DIP (DEVICE)
Bilirubin Urine: NEGATIVE
Glucose, UA: 500 mg/dL — AB
Ketones, ur: NEGATIVE mg/dL
Leukocytes,Ua: NEGATIVE
Nitrite: NEGATIVE
Protein, ur: NEGATIVE mg/dL
Specific Gravity, Urine: >=1.03 (ref 1.005–1.030)
Urobilinogen, UA: 0.2 mg/dL (ref 0.0–1.0)
pH: 6 (ref 5.0–8.0)

## 2018-11-17 LAB — POCT PREGNANCY, URINE: Preg Test, Ur: NEGATIVE

## 2018-11-17 MED ORDER — VITAMIN D3 10 MCG (400 UNIT) PO TABS: 800.0000 [IU] | ORAL_TABLET | Freq: Every day | ORAL | 2 refills | Status: DC

## 2018-11-17 MED ORDER — NYSTATIN 100000 UNIT/GM EX CREA: TOPICAL_CREAM | CUTANEOUS | 0 refills | Status: DC

## 2018-11-17 MED ORDER — FLUCONAZOLE 150 MG PO TABS: 150.0000 mg | ORAL_TABLET | Freq: Every day | ORAL | 0 refills | Status: DC

## 2018-11-17 MED ORDER — PRENATAL 27-1 MG PO TABS: 1.0000 | ORAL_TABLET | Freq: Every day | ORAL | 1 refills | Status: DC

## 2018-11-17 NOTE — Discharge Instructions (Addendum)
We are checking you for bacteria vaginosis, yeast infection and STDs. Your urine did not show any infection I am sending for culture. We will treat based on what your results show in a few days. Treating you for yeast infection today. Take the medication as prescribed Follow up as needed for continued or worsening symptoms

## 2018-11-17 NOTE — ED Triage Notes (Signed)
Pt reports vaginal itching, urinary frequency, lower back pain and abdominal cramping.  She has been seen twice on 10/2 & 10/6 for this.  She states she completed her antibiotics and Diflucan as directed.  She did not have the vaginal itching at that time, but has it now.

## 2018-11-17 NOTE — ED Notes (Signed)
Urine in lab, instructed to obtain swab-patient on phone

## 2018-11-17 NOTE — ED Provider Notes (Signed)
[facility]    CSN: [id number] Arrival date & time: 11/17/18  3976      History   Chief Complaint Chief Complaint  Patient presents with  . Vaginal Itching  . Back Pain  . Abdominal Cramping    HPI Elizabeth Warren is a 38 y.o. female.   Pt is a 38 year old female that presents with vaginal itching, irritation, urinary frequency, lower back pain and mild abdominal cramping.  She was recently treated for a urinary tract infection.  Prior to that she was treated for a yeast infection.  She has completed all of the medication.  The symptoms started approximately 2 days ago. Symptoms have been constant and she has been scratching herself raw. Requesting the medication  that she had before.   ROS per HPI      Past Medical History:  Diagnosis Date  . Gestational diabetes   . Hypertension    pregnancy  . NVD (normal vaginal delivery) 09/20/2010  . Pre-existing type 2 diabetes mellitus during pregnancy in second trimester 10/02/2016    Patient Active Problem List   Diagnosis Date Noted  . UTI (urinary tract infection) 11/10/2018  . History of gestational diabetes 09/06/2016  . Obesity (BMI 30-39.9) 10/31/2011  . Vaginal candidiasis 09/16/2006    Past Surgical History:  Procedure Laterality Date  . NO PAST SURGERIES      OB History    Gravida  5   Para  5   Term  5   Preterm  0   AB  0   Living  5     SAB  0   TAB  0   Ectopic  0   Multiple  0   Live Births  5            Home Medications    Prior to Admission medications   Medication Sig Start Date End Date Taking? Authorizing Provider  ibuprofen (ADVIL) 600 MG tablet Take 1 tablet (600 mg total) by mouth every 8 (eight) hours as needed. 11/10/18  Yes Patriciaann Clan, DO  Cholecalciferol (VITAMIN D3) 10 MCG (400 UNIT) tablet Take 2 tablets (800 Units total) by mouth daily. 11/17/18   Loura Halt A, NP  ferrous sulfate 325 (65 FE) MG EC tablet Take 325 mg by mouth 3 (three) times  daily with meals.    [provider]  fluconazole (DIFLUCAN) 150 MG tablet Take 1 tablet (150 mg total) by mouth daily. 11/17/18   Loura Halt A, NP  Melatonin 3 MG CAPS Take 1 capsule (3 mg total) by mouth at bedtime. 08/06/18   Chase Picket, MD  nystatin cream (MYCOSTATIN) Apply to affected area 2 times daily 11/17/18   Loura Halt A, NP  Prenatal 27-1 MG TABS Take 1 tablet by mouth daily. 11/17/18   Orvan July, NP    Family History Family History  Problem Relation Age of Onset  . Diabetes Mother   . Hypertension Mother   . Diabetes Father   . Hypertension Father   . Diabetes Maternal Grandmother   . Hypertension Maternal Grandmother   . Diabetes Maternal Grandfather   . Hypertension Maternal Grandfather   . Diabetes Paternal Grandmother   . Hypertension Paternal Grandmother   . Diabetes Paternal Grandfather   . Hypertension Paternal Grandfather   . Cancer Neg Hx   . Heart disease Neg Hx   . Stroke Neg Hx     Social History Social History  Tobacco Use  . Smoking status: Never Smoker  . Smokeless tobacco: Never Used  Substance Use Topics  . Alcohol use: No  . Drug use: No     Allergies   Patient has no known allergies.   Review of Systems Review of Systems   Physical Exam Triage Vital Signs ED Triage Vitals  Enc Vitals Group     BP 11/17/18 1019 128/87     Pulse Rate 11/17/18 1019 93     Resp 11/17/18 1019 12     Temp 11/17/18 1019 98.7 F (37.1 C)     Temp Source 11/17/18 1019 Oral     SpO2 11/17/18 1019 99 %     Weight --      Height --      Head Circumference --      Peak Flow --      Pain Score 11/17/18 1020 10     Pain Loc --      Pain Edu? --      Excl. in [place]? --    No data found.  Updated Vital Signs BP 128/87 (BP Location: Right Arm)   Pulse 93   Temp 98.7 F (37.1 C) (Oral)   Resp 12   LMP 11/05/2018 (Exact Date)   SpO2 99%   Visual Acuity Right Eye Distance:   Left Eye Distance:   Bilateral Distance:     Right Eye Near:   Left Eye Near:    Bilateral Near:     Physical Exam Vitals signs and nursing note reviewed.  Constitutional:      General: She is not in acute distress.    Appearance: Normal appearance. She is not ill-appearing, toxic-appearing or diaphoretic.  HENT:     Head: Normocephalic.     Nose: Nose normal.     Mouth/Throat:     Pharynx: Oropharynx is clear.  Eyes:     Conjunctiva/sclera: Conjunctivae normal.  Neck:     Musculoskeletal: Normal range of motion.  Pulmonary:     Effort: Pulmonary effort is normal.  Abdominal:     Palpations: Abdomen is soft.     Tenderness: There is no abdominal tenderness.  Musculoskeletal: Normal range of motion.  Skin:    General: Skin is warm and dry.     Findings: No rash.  Neurological:     Mental Status: She is alert.  Psychiatric:        Mood and Affect: Mood normal.      UC Treatments / Results  Labs (all labs ordered are listed, but only abnormal results are displayed) Labs Reviewed  POCT URINALYSIS DIP (DEVICE) - Abnormal; Notable for the following components:      Result Value   Glucose, UA 500 (*)    Hgb urine dipstick TRACE (*)    All other components within normal limits  URINE CULTURE  POC URINE PREG, ED  POCT PREGNANCY, URINE  CERVICOVAGINAL ANCILLARY ONLY    EKG   Radiology No results found.  Procedures Procedures (including critical care time)  Medications Ordered in UC Medications - No data to display  Initial Impression / Assessment and Plan / UC Course  I have reviewed the triage vital signs and the nursing notes.  Pertinent labs & imaging results that were available during my care of the patient were reviewed by me and considered in my medical decision making (see chart for details).     Treating for yeast infection this most likely due to the antibiotic course Recommended  follow-up with her doctor for any continued worsening symptoms. Swab sent for testing with labs pending.  Urine  sent for culture. Final Clinical Impressions(s) / UC Diagnoses   Final diagnoses:  Vaginal irritation  Urinary frequency  Low back pain, unspecified back pain laterality, unspecified chronicity, unspecified whether sciatica present     Discharge Instructions     We are checking you for bacteria vaginosis, yeast infection and STDs. Your urine did not show any infection I am sending for culture. We will treat based on what your results show in a few days. Treating you for yeast infection today. Take the medication as prescribed Follow up as needed for continued or worsening symptoms     ED Prescriptions    Medication Sig Dispense Auth. Provider   Prenatal 27-1 MG TABS Take 1 tablet by mouth daily. 30 tablet Francys Bolin A, NP   Cholecalciferol (VITAMIN D3) 10 MCG (400 UNIT) tablet Take 2 tablets (800 Units total) by mouth daily. 60 tablet Finnigan Warriner A, NP   fluconazole (DIFLUCAN) 150 MG tablet Take 1 tablet (150 mg total) by mouth daily. 2 tablet Shaana Acocella A, NP   nystatin cream (MYCOSTATIN) Apply to affected area 2 times daily 30 g Kei Mcelhiney A, NP     PDMP not reviewed this encounter.   Janace Aris, NP 11/18/18 [phone]

## 2018-11-18 LAB — URINE CULTURE: Culture: <10000 — AB

## 2018-11-19 LAB — CERVICOVAGINAL ANCILLARY ONLY
Bacterial vaginitis: NEGATIVE
Candida vaginitis: POSITIVE — AB
Chlamydia: NEGATIVE
Neisseria Gonorrhea: NEGATIVE
Trichomonas: NEGATIVE

## 2018-11-23 ENCOUNTER — Other Ambulatory Visit: Payer: Self-pay | Admitting: *Deleted

## 2018-11-23 DIAGNOSIS — Z20822 Contact with and (suspected) exposure to covid-19: Secondary | ICD-10-CM

## 2018-11-23 DIAGNOSIS — Z20828 Contact with and (suspected) exposure to other viral communicable diseases: Secondary | ICD-10-CM | POA: Diagnosis not present

## 2018-11-25 LAB — NOVEL CORONAVIRUS, NAA: SARS-CoV-2, NAA: NOT DETECTED

## 2019-02-22 DIAGNOSIS — H47333 Pseudopapilledema of optic disc, bilateral: Secondary | ICD-10-CM | POA: Diagnosis not present

## 2019-02-22 DIAGNOSIS — H1011 Acute atopic conjunctivitis, right eye: Secondary | ICD-10-CM | POA: Diagnosis not present

## 2019-02-22 DIAGNOSIS — H538 Other visual disturbances: Secondary | ICD-10-CM | POA: Diagnosis not present

## 2019-05-04 ENCOUNTER — Other Ambulatory Visit: Payer: Self-pay | Admitting: *Deleted

## 2019-05-04 MED ORDER — PRENATAL 27-1 MG PO TABS: 1.0000 | ORAL_TABLET | Freq: Every day | ORAL | 1 refills | Status: DC

## 2019-06-22 ENCOUNTER — Other Ambulatory Visit: Payer: Self-pay | Admitting: Obstetrics

## 2019-06-22 DIAGNOSIS — Z3480 Encounter for supervision of other normal pregnancy, unspecified trimester: Secondary | ICD-10-CM

## 2019-06-23 ENCOUNTER — Other Ambulatory Visit: Payer: Self-pay | Admitting: Obstetrics

## 2019-06-23 DIAGNOSIS — Z3169 Encounter for other general counseling and advice on procreation: Secondary | ICD-10-CM

## 2019-06-23 MED ORDER — VITAFOL ULTRA 29-0.6-0.4-200 MG PO CAPS: 1.0000 | ORAL_CAPSULE | Freq: Every day | ORAL | 4 refills | Status: DC

## 2019-06-30 ENCOUNTER — Other Ambulatory Visit: Payer: Self-pay

## 2019-06-30 MED ORDER — PRENATAL 27-1 MG PO TABS: 1.0000 | ORAL_TABLET | Freq: Every day | ORAL | 1 refills | Status: DC

## 2019-08-31 NOTE — Progress Notes (Signed)
   SUBJECTIVE:  CHIEF COMPLAINT / HPI:   Diabetes Patient reports fatigue, difficulty sleeping, polyuria, nocturia. Does report GDM with her last pregnancy. Very concerned that she has diabetes. Also complains of vaginal itching over the last few days. Patient with 10 lb weight gain over the last year.   PERTINENT  PMH / PSH: GDM OBJECTIVE:  BP (!) 128/86   Pulse (!) 106   Ht 5' (1.524 m)   Wt (!) 209 lb (94.8 kg)   SpO2 97%   BMI 40.82 kg/m   General: Well appearing patient, NAD  GU: External vulva and vagina nonerythematous, without any obvious lesions or rash.  no abnormal discharge appreciated. Normal ruggae of vaginal walls.  Cervix is non erythematous and non-friable. There is no cervical motion tenderness, masses or gross abnormalities appreciated during bimanual exam.   ASSESSMENT/PLAN:  Type 2 diabetes mellitus without complication, without long-term current use of insulin (HCC) Patient's A1c elevated today to 7.7 from 5.4 in 2019. This is likely the cause of her polyuria, nocturia, fatigue symptoms. Patient has also had 10 pound weight gain over the last year. Called patient to make follow-up visit to further discuss lab results and management moving forward. Lipid panel also ordered. Other significant labs from today include glucosuria = 500, proteinuria of 30 and trace intact RBCs on micro.  Patient would benefit from dm rx, Ace/ARB, and statin.   Vaginal itching Positive yeast today. Called patient and sent diflucan.    Melene Plan, MD [facility]

## 2019-09-01 ENCOUNTER — Other Ambulatory Visit (HOSPITAL_COMMUNITY)
Admission: RE | Admit: 2019-09-01 | Discharge: 2019-09-01 | Disposition: A | Discharge status: Home / Self Care | Payer: Medicaid Other | Source: Ambulatory Visit | Attending: Family Medicine | Admitting: Family Medicine

## 2019-09-01 ENCOUNTER — Encounter: Payer: Self-pay | Admitting: Family Medicine

## 2019-09-01 ENCOUNTER — Ambulatory Visit (INDEPENDENT_AMBULATORY_CARE_PROVIDER_SITE_OTHER): Payer: Medicaid Other | Admitting: Family Medicine

## 2019-09-01 ENCOUNTER — Other Ambulatory Visit: Payer: Self-pay

## 2019-09-01 VITALS — BP 128/86 | HR 106 | Ht 60.0 in | Wt 209.0 lb

## 2019-09-01 DIAGNOSIS — R7989 Other specified abnormal findings of blood chemistry: Secondary | ICD-10-CM | POA: Diagnosis not present

## 2019-09-01 DIAGNOSIS — Z1159 Encounter for screening for other viral diseases: Secondary | ICD-10-CM

## 2019-09-01 DIAGNOSIS — E119 Type 2 diabetes mellitus without complications: Secondary | ICD-10-CM

## 2019-09-01 DIAGNOSIS — R635 Abnormal weight gain: Secondary | ICD-10-CM

## 2019-09-01 DIAGNOSIS — Z124 Encounter for screening for malignant neoplasm of cervix: Secondary | ICD-10-CM | POA: Insufficient documentation

## 2019-09-01 DIAGNOSIS — Z6841 Body Mass Index (BMI) 40.0 and over, adult: Secondary | ICD-10-CM

## 2019-09-01 DIAGNOSIS — E1165 Type 2 diabetes mellitus with hyperglycemia: Secondary | ICD-10-CM | POA: Insufficient documentation

## 2019-09-01 DIAGNOSIS — N898 Other specified noninflammatory disorders of vagina: Secondary | ICD-10-CM | POA: Diagnosis not present

## 2019-09-01 LAB — POCT URINALYSIS DIP (MANUAL ENTRY)
Bilirubin, UA: NEGATIVE
Glucose, UA: 500 mg/dL — AB
Ketones, POC UA: NEGATIVE mg/dL
Leukocytes, UA: NEGATIVE
Nitrite, UA: NEGATIVE
Protein Ur, POC: 30 mg/dL — AB
Spec Grav, UA: 1.02 (ref 1.010–1.025)
Urobilinogen, UA: 0.2 E.U./dL
pH, UA: 6 (ref 5.0–8.0)

## 2019-09-01 LAB — POCT WET PREP (WET MOUNT)
Clue Cells Wet Prep Whiff POC: NEGATIVE
Trichomonas Wet Prep HPF POC: ABSENT

## 2019-09-01 LAB — POCT GLYCOSYLATED HEMOGLOBIN (HGB A1C): HbA1c, POC (controlled diabetic range): 7.7 % — AB (ref 0.0–7.0)

## 2019-09-01 MED ORDER — FLUCONAZOLE 150 MG PO TABS: 150.0000 mg | ORAL_TABLET | Freq: Once | ORAL | 0 refills | Status: AC

## 2019-09-01 MED ORDER — CETIRIZINE HCL 10 MG PO TABS: 10.0000 mg | ORAL_TABLET | Freq: Every day | ORAL | 11 refills | Status: DC

## 2019-09-01 NOTE — Assessment & Plan Note (Addendum)
Patient's A1c elevated today to 7.7 from 5.4 in 2019. This is likely the cause of her polyuria, nocturia, fatigue symptoms. Patient has also had 10 pound weight gain over the last year. Called patient to make follow-up visit to further discuss lab results and management moving forward. Lipid panel also ordered. Other significant labs from today include glucosuria = 500, proteinuria of 30 and trace intact RBCs on micro.  Patient would benefit from dm rx, Ace/ARB, and statin.

## 2019-09-01 NOTE — Assessment & Plan Note (Addendum)
Positive yeast today. Called patient and sent diflucan.

## 2019-09-02 ENCOUNTER — Other Ambulatory Visit: Payer: Self-pay

## 2019-09-02 ENCOUNTER — Ambulatory Visit (HOSPITAL_COMMUNITY)
Admission: EM | Admit: 2019-09-02 | Discharge: 2019-09-02 | Discharge status: Against Medical Advice | Payer: Medicaid Other

## 2019-09-02 LAB — TSH: TSH: 0.19 u[IU]/mL — ABNORMAL LOW (ref 0.450–4.500)

## 2019-09-02 LAB — HEPATITIS C ANTIBODY: Hep C Virus Ab: <0.1 s/co ratio (ref 0.0–0.9)

## 2019-09-02 NOTE — Addendum Note (Signed)
Addended by: Melene Plan on: 09/02/2019 08:40 AM   Modules accepted: Orders

## 2019-09-03 ENCOUNTER — Ambulatory Visit (INDEPENDENT_AMBULATORY_CARE_PROVIDER_SITE_OTHER): Payer: Medicaid Other | Admitting: Family Medicine

## 2019-09-03 ENCOUNTER — Other Ambulatory Visit: Payer: Self-pay

## 2019-09-03 VITALS — BP 110/82 | HR 65 | Wt 206.6 lb

## 2019-09-03 DIAGNOSIS — E119 Type 2 diabetes mellitus without complications: Secondary | ICD-10-CM

## 2019-09-03 DIAGNOSIS — R7989 Other specified abnormal findings of blood chemistry: Secondary | ICD-10-CM | POA: Diagnosis not present

## 2019-09-03 LAB — T4, FREE: Free T4: 1.03 ng/dL (ref 0.82–1.77)

## 2019-09-03 LAB — GLUCOSE, POCT (MANUAL RESULT ENTRY): POC Glucose: 148 mg/dl — AB (ref 70–99)

## 2019-09-03 LAB — CYTOLOGY - PAP
Comment: NEGATIVE
Diagnosis: NEGATIVE
High risk HPV: NEGATIVE

## 2019-09-03 LAB — SPECIMEN STATUS REPORT

## 2019-09-03 MED ORDER — ACCU-CHEK SOFTCLIX LANCETS MISC: 12 refills | Status: DC

## 2019-09-03 MED ORDER — ACCU-CHEK GUIDE ME W/DEVICE KIT: 1.0000 [IU] | PACK | 0 refills | Status: DC

## 2019-09-03 MED ORDER — ACCU-CHEK GUIDE VI STRP: ORAL_STRIP | 12 refills | Status: DC

## 2019-09-03 MED ORDER — METFORMIN HCL ER 500 MG PO TB24: 500.0000 mg | ORAL_TABLET | Freq: Two times a day (BID) | ORAL | 3 refills | Status: DC

## 2019-09-03 NOTE — Patient Instructions (Signed)
It was a pleasure to meet you today.  I am sorry that you are having these issues with your sugars.  I am going to start you on a medication called Metformin XR.  You have taken Metformin in the past and had some stomach issues.  I would like for you to make sure that you take this medication with breakfast.  You will take it once daily for 2 weeks and then after 2 weeks you will take it twice daily, once with breakfast and once with dinner.  I would like for you to come back to the clinic for a follow-up visit in 1 month.  Regarding your thyroid issues I want to collect some lab work today and will decide what the next steps are after those results come back.  I am also going to check your kidney function and your electrolytes.  If you have any questions or concerns please feel free to call our clinic.  I hope you have a wonderful afternoon!   Hypoglycemia Hypoglycemia is when the sugar (glucose) level in your blood is too low. Signs of low blood sugar may include:  Feeling: ? Hungry. ? Worried or nervous (anxious). ? Sweaty and clammy. ? Confused. ? Dizzy. ? Sleepy. ? Sick to your stomach (nauseous).  Having: ? A fast heartbeat. ? A headache. ? A change in your vision. ? Tingling or no feeling (numbness) around your mouth, lips, or tongue. ? Jerky movements that you cannot control (seizure).  Having trouble with: ? Moving (coordination). ? Sleeping. ? Passing out (fainting). ? Getting upset easily (irritability). Low blood sugar can happen to people who have diabetes and people who do not have diabetes. Low blood sugar can happen quickly, and it can be an emergency. Treating low blood sugar Low blood sugar is often treated by eating or drinking something sugary right away, such as:  Fruit juice, 4-6 oz (120-150 mL).  Regular soda (not diet soda), 4-6 oz (120-150 mL).  Low-fat milk, 4 oz (120 mL).  Several pieces of hard candy.  Sugar or honey, 1 Tbsp (15 mL). Treating low  blood sugar if you have diabetes If you can think clearly and swallow safely, follow the 15:15 rule:  Take 15 grams of a fast-acting carb (carbohydrate). Talk with your doctor about how much you should take.  Always keep a source of fast-acting carb with you, such as: ? Sugar tablets (glucose pills). Take 3-4 pills. ? 6-8 pieces of hard candy. ? 4-6 oz (120-150 mL) of fruit juice. ? 4-6 oz (120-150 mL) of regular (not diet) soda. ? 1 Tbsp (15 mL) honey or sugar.  Check your blood sugar 15 minutes after you take the carb.  If your blood sugar is still at or below 70 mg/dL (3.9 mmol/L), take 15 grams of a carb again.  If your blood sugar does not go above 70 mg/dL (3.9 mmol/L) after 3 tries, get help right away.  After your blood sugar goes back to normal, eat a meal or a snack within 1 hour.  Treating very low blood sugar If your blood sugar is at or below 54 mg/dL (3 mmol/L), you have very low blood sugar (severe hypoglycemia). This may also cause:  Passing out.  Jerky movements you cannot control (seizure).  Losing consciousness (coma). This is an emergency. Do not wait to see if the symptoms will go away. Get medical help right away. Call your local emergency services (911 in the [place] Do not drive yourself  to the hospital. If you have very low blood sugar and you cannot eat or drink, you may need a glucagon shot (injection). A family member or friend should learn how to check your blood sugar and how to give you a glucagon shot. Ask your doctor if you need to have a glucagon shot kit at home. Follow these instructions at home: General instructions  Take over-the-counter and prescription medicines only as told by your doctor.  Stay aware of your blood sugar as told by your doctor.  Limit alcohol intake to no more than 1 drink a day for nonpregnant women and 2 drinks a day for men. One drink equals 12 oz of beer (355 mL), 5 oz of wine (148 mL), or 1 oz of hard liquor (44  mL).  Keep all follow-up visits as told by your doctor. This is important. If you have diabetes:   Follow your diabetes care plan as told by your doctor. Make sure you: ? Know the signs of low blood sugar. ? Take your medicines as told. ? Follow your exercise and meal plan. ? Eat on time. Do not skip meals. ? Check your blood sugar as often as told by your doctor. Always check it before and after exercise. ? Follow your sick day plan when you cannot eat or drink normally. Make this plan ahead of time with your doctor.  Share your diabetes care plan with: ? Your work or school. ? People you live with.  Check your pee (urine) for ketones: ? When you are sick. ? As told by your doctor.  Carry a card or wear jewelry that says you have diabetes. Contact a doctor if:  You have trouble keeping your blood sugar in your target range.  You have low blood sugar often. Get help right away if:  You still have symptoms after you eat or drink something sugary.  Your blood sugar is at or below 54 mg/dL (3 mmol/L).  You have jerky movements that you cannot control.  You pass out. These symptoms may be an emergency. Do not wait to see if the symptoms will go away. Get medical help right away. Call your local emergency services (911 in the [place] Do not drive yourself to the hospital. Summary  Hypoglycemia happens when the level of sugar (glucose) in your blood is too low.  Low blood sugar can happen to people who have diabetes and people who do not have diabetes. Low blood sugar can happen quickly, and it can be an emergency.  Make sure you know the signs of low blood sugar and know how to treat it.  Always keep a source of sugar (fast-acting carb) with you to treat low blood sugar. This information is not intended to replace advice given to you by your health care provider. Make sure you discuss any questions you have with your health care provider. Document Revised: 05/14/2018  Document Reviewed: 02/24/2015 Elsevier Patient Education  [place]. Metformin extended-release tablets What is this medicine? METFORMIN (met FOR min) is used to treat type 2 diabetes. It helps to control blood sugar. Treatment is combined with diet and exercise. This medicine can be used alone or with other medicines for diabetes. This medicine may be used for other purposes; ask your health care provider or pharmacist if you have questions. COMMON BRAND NAME(S): Fortamet, Glucophage XR, Glumetza What should I tell my health care provider before I take this medicine? They need to know if you have any of  these conditions:  anemia  dehydration  heart disease  frequently drink alcohol-containing beverages  kidney disease  liver disease  polycystic ovary syndrome  serious infection or injury  vomiting  an unusual or allergic reaction to metformin, other medicines, foods, dyes, or preservatives  pregnant or trying to get pregnant  breast-feeding How should I use this medicine? Take this medicine by mouth with a glass of water. Follow the directions on the prescription label. Take this medicine with food. Take your medicine at regular intervals. Do not take your medicine more often than directed. Do not stop taking except on your doctor's advice. Talk to your pediatrician regarding the use of this medicine in children. Special care may be needed. Overdosage: If you think you have taken too much of this medicine contact a poison control center or emergency room at once. NOTE: This medicine is only for you. Do not share this medicine with others. What if I miss a dose? If you miss a dose, take it as soon as you can. If it is almost time for your next dose, take only that dose. Do not take double or extra doses. What may interact with this medicine? Do not take this medicine with any of the following medications:  certain contrast medicines given before X-rays, CT scans,  MRI, or other procedures  dofetilide This medicine may also interact with the following medications:  acetazolamide  alcohol  certain antivirals for HIV or hepatitis  certain medicines for blood pressure, heart disease, irregular heart beat  cimetidine  dichlorphenamide  digoxin  diuretics  female hormones, like estrogens or progestins and birth control pills  glycopyrrolate  isoniazid  lamotrigine  memantine  methazolamide  metoclopramide  midodrine  niacin  phenothiazines like chlorpromazine, mesoridazine, prochlorperazine, thioridazine  phenytoin  ranolazine  steroid medicines like prednisone or cortisone  stimulant medicines for attention disorders, weight loss, or to stay awake  thyroid medicines  topiramate  trospium  vandetanib  zonisamide This list may not describe all possible interactions. Give your health care provider a list of all the medicines, herbs, non-prescription drugs, or dietary supplements you use. Also tell them if you smoke, drink alcohol, or use illegal drugs. Some items may interact with your medicine. What should I watch for while using this medicine? Visit your doctor or health care professional for regular checks on your progress. A test called the HbA1C (A1C) will be monitored. This is a simple blood test. It measures your blood sugar control over the last 2 to 3 months. You will receive this test every 3 to 6 months. Learn how to check your blood sugar. Learn the symptoms of low and high blood sugar and how to manage them. Always carry a quick-source of sugar with you in case you have symptoms of low blood sugar. Examples include hard sugar candy or glucose tablets. Make sure others know that you can choke if you eat or drink when you develop serious symptoms of low blood sugar, such as seizures or unconsciousness. They must get medical help at once. Tell your doctor or health care professional if you have high blood  sugar. You might need to change the dose of your medicine. If you are sick or exercising more than usual, you might need to change the dose of your medicine. Do not skip meals. Ask your doctor or health care professional if you should avoid alcohol. Many nonprescription cough and cold products contain sugar or alcohol. These can affect blood sugar. This medicine may  cause ovulation in premenopausal women who do not have regular monthly periods. This may increase your chances of becoming pregnant. You should not take this medicine if you become pregnant or think you may be pregnant. Talk with your doctor or health care professional about your birth control options while taking this medicine. Contact your doctor or health care professional right away if you think you are pregnant. The tablet shell for some brands of this medicine does not dissolve. This is normal. The tablet shell may appear whole in the stool. This is not a cause for concern. If you are going to need surgery, a MRI, CT scan, or other procedure, tell your doctor that you are taking this medicine. You may need to stop taking this medicine before the procedure. Wear a medical ID bracelet or chain, and carry a card that describes your disease and details of your medicine and dosage times. This medicine may cause a decrease in folic acid and vitamin B12. You should make sure that you get enough vitamins while you are taking this medicine. Discuss the foods you eat and the vitamins you take with your health care professional. What side effects may I notice from receiving this medicine? Side effects that you should report to your doctor or health care professional as soon as possible:  allergic reactions like skin rash, itching or hives, swelling of the face, lips, or tongue  breathing problems  feeling faint or lightheaded, falls  muscle aches or pains  signs and symptoms of low blood sugar such as feeling anxious, confusion, dizziness,  increased hunger, unusually weak or tired, sweating, shakiness, cold, irritable, headache, blurred vision, fast heartbeat, loss of consciousness  slow or irregular heartbeat  unusual stomach pain or discomfort  unusually tired or weak Side effects that usually do not require medical attention (report to your doctor or health care professional if they continue or are bothersome):  diarrhea  headache  heartburn  metallic taste in mouth  nausea  stomach gas, upset This list may not describe all possible side effects. Call your doctor for medical advice about side effects. You may report side effects to FDA at 1-800-FDA-1088. Where should I keep my medicine? Keep out of the reach of children. Store at room temperature between 15 and 30 degrees C (59 and 86 degrees F). Protect from light. Throw away any unused medicine after the expiration date. NOTE: This sheet is a summary. It may not cover all possible information. If you have questions about this medicine, talk to your doctor, pharmacist, or health care provider.  2020 Elsevier/Gold Standard (2017-02-27 18:58:32)

## 2019-09-03 NOTE — Assessment & Plan Note (Signed)
Hemoglobin A1c on 7/28 was 7.7 from previous of 5.4.  Patient reports still having polyuria and fatigue but have had mild improvement.  Discussed medications at length with the patient regarding her options and initially she was interested in GLP-1 but then decided to try Metformin again.  Patient may benefit from GLP-1 in the future especially if she cannot tolerate Metformin. -Metformin XR 500 mg once daily for 2 weeks which will be increased to twice daily after that time -BMP today -Blood glucose monitoring supplies sent to pharmacy -Consider ACE/ARB

## 2019-09-03 NOTE — Assessment & Plan Note (Signed)
Patient lab work indicating low TSH on evaluation on 7/28.  T4 was within normal limits.  Patient has complaints of fatigue but may be subclinical hypothyroidism. -Collecting T3 at this time -Consider TSH receptor antibody check -May need referral to endocrinology

## 2019-09-03 NOTE — Progress Notes (Signed)
° ° °  SUBJECTIVE:   CHIEF COMPLAINT / HPI:  Of note a translator was offered for the patient but she declines Nurse, learning disability.  New diagnosis of type 2 diabetes Patient was recently seen for fatigue, polyuria, polydipsia and was found to have elevated blood sugars with a hemoglobin A1c of 7.7.  Patient reports that she is still having polyuria and fatigue but that her symptoms have improved since being seen 2 days ago.  She is back today to discuss medication management.  She reports that she has tried Metformin during pregnancy and right after pregnancy and it caused upset stomach and weight loss so she stopped taking it.  She understands that she now has diabetes and would like medication for this but does not want to try Metformin again.  After a long back-and-forth discussion the patient agrees to try Trulicity.  While discussing the process patient then decides that she would like to try metformin 1 more time.  She is also requesting blood glucose testing supplies.  Hypothyroidism Patient evaluated for fatigue 2 days ago and was found to have low TSH with normal T4.  Patient denies feeling any nodules in her thyroid or difficulty swallowing.  Patient would like to know if she needs to be on any medications for this.   OBJECTIVE:   BP 110/82    Pulse 65    Wt (!) 206 lb 9.6 oz (93.7 kg)    SpO2 99%    BMI 40.35 kg/m   General: Well-appearing 39 year old female, no acute distress, sitting comfortably in chair, energetic while speaking HEENT: Patient with normal feeling thyroid, nonenlarged, nontender Cardiac: Regular rate and rhythm, no murmurs appreciated Respiratory: Normal work of breathing, lungs clear to auscultation bilaterally Abdomen: Soft, nontender, positive bowel sounds  ASSESSMENT/PLAN:   Type 2 diabetes mellitus without complication, without long-term current use of insulin (HCC) Hemoglobin A1c on 7/28 was 7.7 from previous of 5.4.  Patient reports still having polyuria and  fatigue but have had mild improvement.  Discussed medications at length with the patient regarding her options and initially she was interested in GLP-1 but then decided to try Metformin again.  Patient may benefit from GLP-1 in the future especially if she cannot tolerate Metformin. -Metformin XR 500 mg once daily for 2 weeks which will be increased to twice daily after that time -BMP today -Blood glucose monitoring supplies sent to pharmacy -Consider ACE/ARB   Low TSH level Patient lab work indicating low TSH on evaluation on 7/28.  T4 was within normal limits.  Patient has complaints of fatigue but may be subclinical hypothyroidism. -Collecting T3 at this time -Consider TSH receptor antibody check -May need referral to endocrinology     Derrel Nip, MD [facility]

## 2019-09-04 LAB — BASIC METABOLIC PANEL
BUN/Creatinine Ratio: 19 (ref 9–23)
BUN: 10 mg/dL (ref 6–20)
CO2: 21 mmol/L (ref 20–29)
Calcium: 9.7 mg/dL (ref 8.7–10.2)
Chloride: 98 mmol/L (ref 96–106)
Creatinine, Ser: 0.54 mg/dL — ABNORMAL LOW (ref 0.57–1.00)
GFR calc Af Amer: 139 mL/min/{1.73_m2} (ref >59)
GFR calc non Af Amer: 121 mL/min/{1.73_m2} (ref >59)
Glucose: 129 mg/dL — ABNORMAL HIGH (ref 65–99)
Potassium: 4.5 mmol/L (ref 3.5–5.2)
Sodium: 138 mmol/L (ref 134–144)

## 2019-09-04 LAB — T3, FREE: T3, Free: 2.9 pg/mL (ref 2.0–4.4)

## 2019-09-04 NOTE — Progress Notes (Signed)
Hello, the thyroid test we collected at your last visit came back within normal limits. There is no need for any treatment for your thyroid issue at this time. You will need to have your thyroid levels rechecked in 6 months.

## 2019-09-05 ENCOUNTER — Encounter (HOSPITAL_COMMUNITY): Payer: Self-pay

## 2019-09-05 ENCOUNTER — Encounter (HOSPITAL_COMMUNITY): Payer: Self-pay | Admitting: Emergency Medicine

## 2019-09-05 ENCOUNTER — Other Ambulatory Visit: Payer: Self-pay

## 2019-09-05 ENCOUNTER — Emergency Department (HOSPITAL_COMMUNITY)
Admission: EM | Admit: 2019-09-05 | Discharge: 2019-09-05 | Disposition: A | Discharge status: Home / Self Care | Payer: Medicaid Other | Attending: Emergency Medicine | Admitting: Emergency Medicine

## 2019-09-05 ENCOUNTER — Ambulatory Visit (HOSPITAL_COMMUNITY)
Admission: EM | Admit: 2019-09-05 | Discharge: 2019-09-05 | Disposition: A | Discharge status: Home / Self Care | Payer: Medicaid Other | Attending: Family Medicine | Admitting: Family Medicine

## 2019-09-05 DIAGNOSIS — R300 Dysuria: Secondary | ICD-10-CM | POA: Diagnosis not present

## 2019-09-05 DIAGNOSIS — M545 Low back pain, unspecified: Secondary | ICD-10-CM

## 2019-09-05 DIAGNOSIS — Z3202 Encounter for pregnancy test, result negative: Secondary | ICD-10-CM | POA: Diagnosis not present

## 2019-09-05 DIAGNOSIS — Z5321 Procedure and treatment not carried out due to patient leaving prior to being seen by health care provider: Secondary | ICD-10-CM | POA: Insufficient documentation

## 2019-09-05 DIAGNOSIS — R319 Hematuria, unspecified: Secondary | ICD-10-CM | POA: Insufficient documentation

## 2019-09-05 LAB — POCT URINALYSIS DIP (DEVICE)
Bilirubin Urine: NEGATIVE
Glucose, UA: NEGATIVE mg/dL
Ketones, ur: NEGATIVE mg/dL
Leukocytes,Ua: NEGATIVE
Nitrite: NEGATIVE
Protein, ur: 30 mg/dL — AB
Specific Gravity, Urine: >=1.03 (ref 1.005–1.030)
Urobilinogen, UA: 0.2 mg/dL (ref 0.0–1.0)
pH: 5.5 (ref 5.0–8.0)

## 2019-09-05 LAB — POC URINE PREG, ED: Preg Test, Ur: NEGATIVE

## 2019-09-05 LAB — CBC WITH DIFFERENTIAL/PLATELET
Abs Immature Granulocytes: 0.04 10*3/uL (ref 0.00–0.07)
Basophils Absolute: 0.1 10*3/uL (ref 0.0–0.1)
Basophils Relative: 1 %
Eosinophils Absolute: 0.5 10*3/uL (ref 0.0–0.5)
Eosinophils Relative: 5 %
HCT: 41.7 % (ref 36.0–46.0)
Hemoglobin: 13.4 g/dL (ref 12.0–15.0)
Immature Granulocytes: 0 %
Lymphocytes Relative: 37 %
Lymphs Abs: 3.9 10*3/uL (ref 0.7–4.0)
MCH: 26.9 pg (ref 26.0–34.0)
MCHC: 32.1 g/dL (ref 30.0–36.0)
MCV: 83.6 fL (ref 80.0–100.0)
Monocytes Absolute: 0.7 10*3/uL (ref 0.1–1.0)
Monocytes Relative: 6 %
Neutro Abs: 5.3 10*3/uL (ref 1.7–7.7)
Neutrophils Relative %: 51 %
Platelets: 332 10*3/uL (ref 150–400)
RBC: 4.99 MIL/uL (ref 3.87–5.11)
RDW: 13.2 % (ref 11.5–15.5)
WBC: 10.5 10*3/uL (ref 4.0–10.5)
nRBC: 0 % (ref 0.0–0.2)

## 2019-09-05 LAB — I-STAT BETA HCG BLOOD, ED (MC, WL, AP ONLY): I-stat hCG, quantitative: <5 m[IU]/mL (ref <5)

## 2019-09-05 LAB — BASIC METABOLIC PANEL
Anion gap: 12 (ref 5–15)
BUN: 9 mg/dL (ref 6–20)
CO2: 22 mmol/L (ref 22–32)
Calcium: 9.5 mg/dL (ref 8.9–10.3)
Chloride: 100 mmol/L (ref 98–111)
Creatinine, Ser: 0.55 mg/dL (ref 0.44–1.00)
GFR calc Af Amer: >60 mL/min (ref >60)
GFR calc non Af Amer: >60 mL/min (ref >60)
Glucose, Bld: 163 mg/dL — ABNORMAL HIGH (ref 70–99)
Potassium: 4 mmol/L (ref 3.5–5.1)
Sodium: 134 mmol/L — ABNORMAL LOW (ref 135–145)

## 2019-09-05 LAB — URINALYSIS, ROUTINE W REFLEX MICROSCOPIC
Bacteria, UA: NONE SEEN
Bilirubin Urine: NEGATIVE
Glucose, UA: NEGATIVE mg/dL
Ketones, ur: NEGATIVE mg/dL
Nitrite: NEGATIVE
Protein, ur: 100 mg/dL — AB
RBC / HPF: >50 RBC/hpf — ABNORMAL HIGH (ref 0–5)
Specific Gravity, Urine: 1.014 (ref 1.005–1.030)
pH: 6 (ref 5.0–8.0)

## 2019-09-05 MED ORDER — KETOROLAC TROMETHAMINE 30 MG/ML IJ SOLN
INTRAMUSCULAR | Status: AC
  Filled 2019-09-05: qty 1

## 2019-09-05 MED ORDER — IBUPROFEN 600 MG PO TABS: 600.0000 mg | ORAL_TABLET | Freq: Three times a day (TID) | ORAL | 0 refills | Status: DC | PRN

## 2019-09-05 MED ORDER — KETOROLAC TROMETHAMINE 30 MG/ML IJ SOLN
30.0000 mg | Freq: Once | INTRAMUSCULAR | Status: AC
  Administered 2019-09-05: 30 mg via INTRAMUSCULAR

## 2019-09-05 MED ORDER — HYDROCODONE-ACETAMINOPHEN 5-325 MG PO TABS: 1.0000 | ORAL_TABLET | Freq: Three times a day (TID) | ORAL | 0 refills | Status: DC | PRN

## 2019-09-05 NOTE — ED Provider Notes (Signed)
[facility]    CSN: [id number] Arrival date & time: 09/05/19  1011      History   Chief Complaint Chief Complaint  Patient presents with   Back Pain    HPI Elizabeth Warren is a 39 y.o. female.   She is presenting with low back pain bilaterally.  She denies any extension to the groin.  No history of kidney stones.  No specific inciting event.  It is worse with certain movements.  It was keeping her up from her sleep.  She is currently on her menstrual cycle.  Has tried ibuprofen.  No history of back surgery.  No history abdominal surgery.  No radicular symptoms.  HPI  Past Medical History:  Diagnosis Date   Gestational diabetes    History of gestational diabetes 09/06/2016   Hypertension    pregnancy   NVD (normal vaginal delivery) 09/20/2010   Pre-existing type 2 diabetes mellitus during pregnancy in second trimester 10/02/2016    Patient Active Problem List   Diagnosis Date Noted   Low TSH level 09/03/2019   Type 2 diabetes mellitus without complication, without long-term current use of insulin ([place]) 09/01/2019   Vaginal itching 09/01/2019   UTI (urinary tract infection) 11/10/2018   Obesity (BMI 30-39.9) 10/31/2011    Past Surgical History:  Procedure Laterality Date   NO PAST SURGERIES      OB History    Gravida  5   Para  5   Term  5   Preterm  0   AB  0   Living  5     SAB  0   TAB  0   Ectopic  0   Multiple  0   Live Births  5            Home Medications    Prior to Admission medications   Medication Sig Start Date End Date Taking? Authorizing Provider  Accu-Chek Softclix Lancets lancets Use as instructed 09/03/19   Gifford Shave, MD  Blood Glucose Monitoring Suppl (ACCU-CHEK GUIDE ME) w/Device KIT 1 Units by Does not apply route every other day. 09/03/19   Gifford Shave, MD  cetirizine (ZYRTEC) 10 MG tablet Take 1 tablet (10 mg total) by mouth daily. 09/01/19   Wilber Oliphant, MD  Cholecalciferol (VITAMIN D3)  10 MCG (400 UNIT) tablet Take 2 tablets (800 Units total) by mouth daily. 11/17/18   Loura Halt A, NP  glucose blood (ACCU-CHEK GUIDE) test strip Use as instructed 09/03/19   Gifford Shave, MD  HYDROcodone-acetaminophen (NORCO/VICODIN) 5-325 MG tablet Take 1 tablet by mouth every 8 (eight) hours as needed for moderate pain. 09/05/19   Rosemarie Ax, MD  ibuprofen (ADVIL) 600 MG tablet Take 1 tablet (600 mg total) by mouth every 8 (eight) hours as needed. 09/05/19   Rosemarie Ax, MD  metFORMIN (GLUCOPHAGE-XR) 500 MG 24 hr tablet Take 1 tablet (500 mg total) by mouth 2 (two) times daily. Will take 1 tablet with breakfast for 2 weeks and then increase to 1 tablet with breakfast and 1 tablet with dinner after 2 weeks 09/03/19   Gifford Shave, MD  Prenat-Fe Poly-Methfol-FA-DHA (VITAFOL ULTRA) 29-0.6-0.4-200 MG CAPS Take 1 capsule by mouth daily before breakfast. 06/23/19   Shelly Bombard, MD  Prenat-FeFum-FePo-FA-DHA w/o A (PROVIDA DHA) 16-16-1.25-110 MG CAPS TAKE 1 CAPSULE BY MOUTH DAILY 06/23/19   Shelly Bombard, MD  Prenatal 27-1 MG TABS Take 1 tablet by mouth daily. 06/30/19   Enid Derry,  [place], DO    Family History Family History  Problem Relation Age of Onset   Diabetes Mother    Hypertension Mother    Diabetes Father    Hypertension Father    Diabetes Maternal Grandmother    Hypertension Maternal Grandmother    Diabetes Maternal Grandfather    Hypertension Maternal Grandfather    Diabetes Paternal Grandmother    Hypertension Paternal Grandmother    Diabetes Paternal Grandfather    Hypertension Paternal Grandfather    Cancer Neg Hx    Heart disease Neg Hx    Stroke Neg Hx     Social History Social History   Tobacco Use   Smoking status: Never Smoker   Smokeless tobacco: Never Used  Scientific laboratory technician Use: Never used  Substance Use Topics   Alcohol use: No   Drug use: No     Allergies   Patient has no known allergies.   Review of  Systems Review of Systems  See HPI  Physical Exam Triage Vital Signs ED Triage Vitals  Enc Vitals Group     BP 09/05/19 1053 (!) 103/61     Pulse Rate 09/05/19 1053 99     Resp 09/05/19 1053 18     Temp 09/05/19 1053 98.1 F (36.7 C)     Temp Source 09/05/19 1053 Oral     SpO2 09/05/19 1053 99 %     Weight 09/05/19 1054 200 lb (90.7 kg)     Height 09/05/19 1054 5' 5"  (1.651 m)     Head Circumference --      Peak Flow --      Pain Score 09/05/19 1054 10     Pain Loc --      Pain Edu? --      Excl. in [place]? --    No data found.  Updated Vital Signs BP (!) 103/61    Pulse 99    Temp 98.1 F (36.7 C) (Oral)    Resp 18    Ht 5' 5"  (1.651 m)    Wt 90.7 kg    SpO2 99%    BMI 33.28 kg/m   Visual Acuity Right Eye Distance:   Left Eye Distance:   Bilateral Distance:    Right Eye Near:   Left Eye Near:    Bilateral Near:     Physical Exam Gen: NAD, alert, cooperative with exam, well-appearing ENT: normal lips, normal nasal mucosa,  Eye: normal EOM, normal conjunctiva and lids Psych:  normal insight, alert and oriented MSK:  Back: Some tenderness palpation over the left and right paraspinal muscles. No tenderness palpation of the SI joint. Normal flexion extension. Normal strength in lower extremity. Negative straight leg raise. Neurovascular intact   UC Treatments / Results  Labs (all labs ordered are listed, but only abnormal results are displayed) Labs Reviewed  POCT URINALYSIS DIP (DEVICE) - Abnormal; Notable for the following components:      Result Value   Hgb urine dipstick LARGE (*)    Protein, ur 30 (*)    All other components within normal limits  URINE CULTURE  POC URINE PREG, ED    EKG   Radiology No results found.  Procedures Procedures (including critical care time)  Medications Ordered in UC Medications  ketorolac (TORADOL) 30 MG/ML injection 30 mg (30 mg Intramuscular Given 09/05/19 1123)    Initial Impression / Assessment and Plan /  UC Course  I have reviewed the triage vital signs and the nursing  notes.  Pertinent labs & imaging results that were available during my care of the patient were reviewed by me and considered in my medical decision making (see chart for details).     Elizabeth Warren is a 39 year old female is presenting with low back pain.  Urinalysis shows mild leukocytes.  A urine culture was sent.  She currently on her cycle and no history of kidney stones.  Possible for musculoskeletal in origin.  Intramuscular Toradol was given.  Provide ibuprofen and Norco for severe pain.  Counseled on supportive care.  Given indications to follow-up.  Final Clinical Impressions(s) / UC Diagnoses   Final diagnoses:  Acute bilateral low back pain without sciatica     Discharge Instructions     Please use the ibuprofen for moderate pain  Please use the norco for severe pain   We will inform you if the culture shows an infection  Please follow up if your symptoms fail to improve.     ED Prescriptions    Medication Sig Dispense Auth. Provider   ibuprofen (ADVIL) 600 MG tablet Take 1 tablet (600 mg total) by mouth every 8 (eight) hours as needed. 30 tablet Rosemarie Ax, MD   HYDROcodone-acetaminophen (NORCO/VICODIN) 5-325 MG tablet Take 1 tablet by mouth every 8 (eight) hours as needed for moderate pain. 10 tablet Rosemarie Ax, MD     I have reviewed the PDMP during this encounter.   Rosemarie Ax, MD 09/05/19 1136

## 2019-09-05 NOTE — Discharge Instructions (Signed)
Please use the ibuprofen for moderate pain  Please use the norco for severe pain   We will inform you if the culture shows an infection  Please follow up if your symptoms fail to improve.

## 2019-09-05 NOTE — ED Notes (Signed)
Called pt x3 for vital recheck no answer 

## 2019-09-05 NOTE — ED Triage Notes (Signed)
Pt c/o 10/10 tightness in back in kidney area, frequent urinationx2 days. Pt states numbness and tingling in legs. Pt walked well to exam room. Pt states went to ED yesterday and had BW done, but LWBS due to wait time.

## 2019-09-05 NOTE — ED Triage Notes (Signed)
Patient reports low back pain with dysuria and hematuria this week , no fever or chills .

## 2019-09-06 ENCOUNTER — Telehealth: Payer: Self-pay | Admitting: *Deleted

## 2019-09-06 LAB — URINE CULTURE

## 2019-09-06 NOTE — Telephone Encounter (Signed)
Pt wants to know if she can fenugreek as a natural supplement for her "sugar".  To PCP. Jone Baseman, CMA

## 2019-09-08 NOTE — Telephone Encounter (Signed)
I have never heard of this supplement. If she would like to try, that would be up to her; however, from a medical standpoint, I can given no recommendations for this supplement.

## 2019-09-12 ENCOUNTER — Other Ambulatory Visit: Payer: Self-pay | Admitting: Family Medicine

## 2019-09-15 ENCOUNTER — Other Ambulatory Visit: Payer: Self-pay

## 2019-09-15 DIAGNOSIS — Z3480 Encounter for supervision of other normal pregnancy, unspecified trimester: Secondary | ICD-10-CM

## 2019-09-16 MED ORDER — PROVIDA DHA 16-16-1.25-110 MG PO CAPS: 1.0000 | ORAL_CAPSULE | Freq: Every day | ORAL | 12 refills | Status: DC

## 2019-09-17 ENCOUNTER — Telehealth: Payer: Self-pay | Admitting: *Deleted

## 2019-09-17 IMAGING — US US MFM OB FOLLOW-UP
1 series · 14 of 28 positions shown · non-contrast
Comparison: none

[Series 1: us mfm ob follow-up · 14 of 58 slices shown]
[im 3/58]
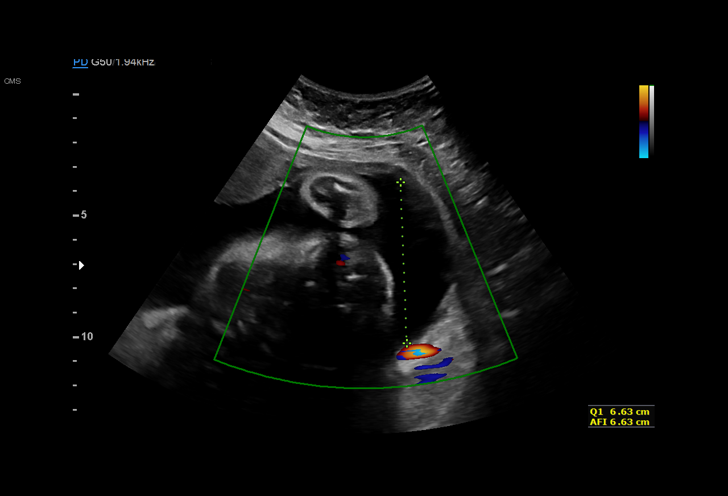
[im 7/58]
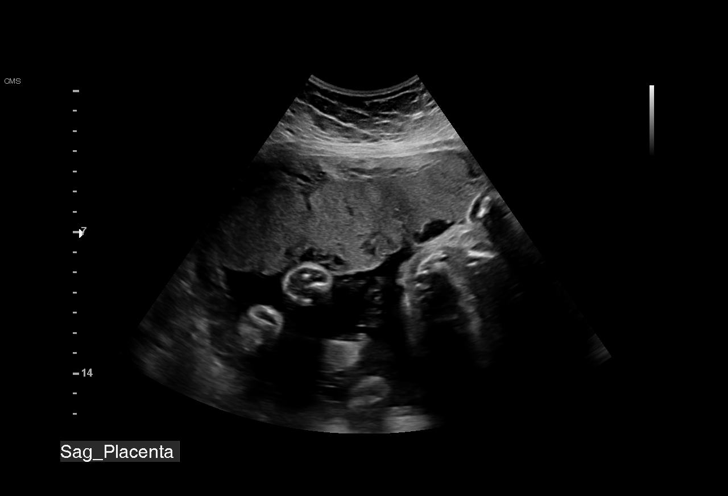
[im 11/58]
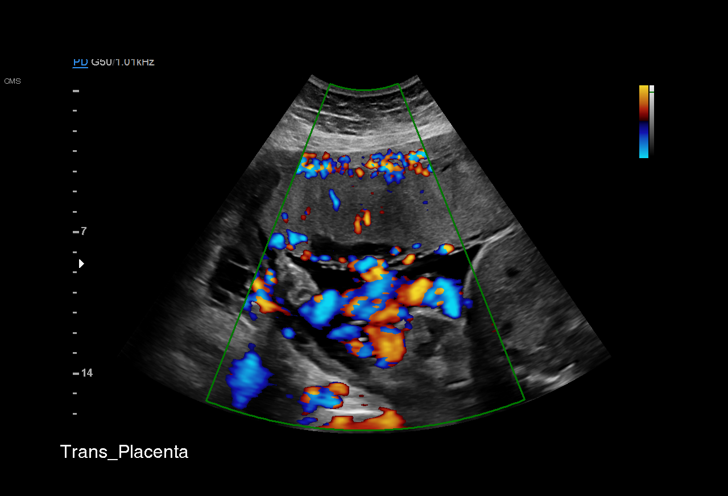
[im 15/58]
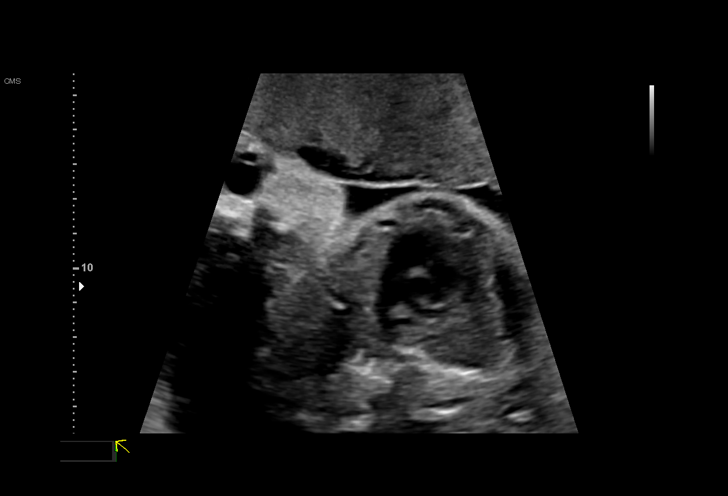
[im 20/58]
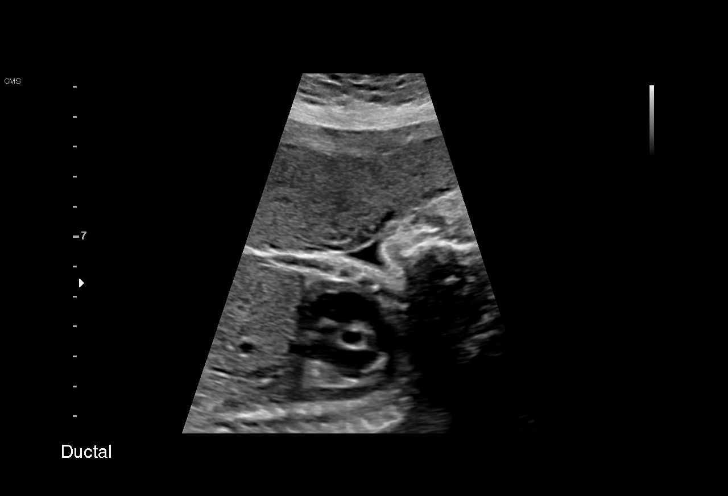
[im 24/58]
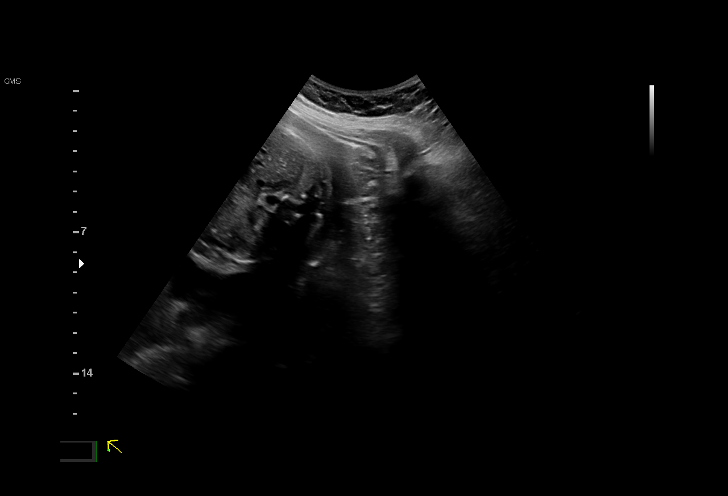
[im 28/58]
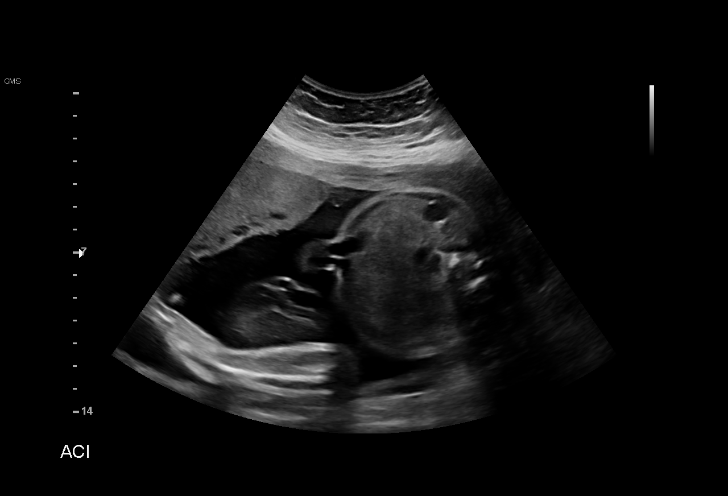
[im 32/58]
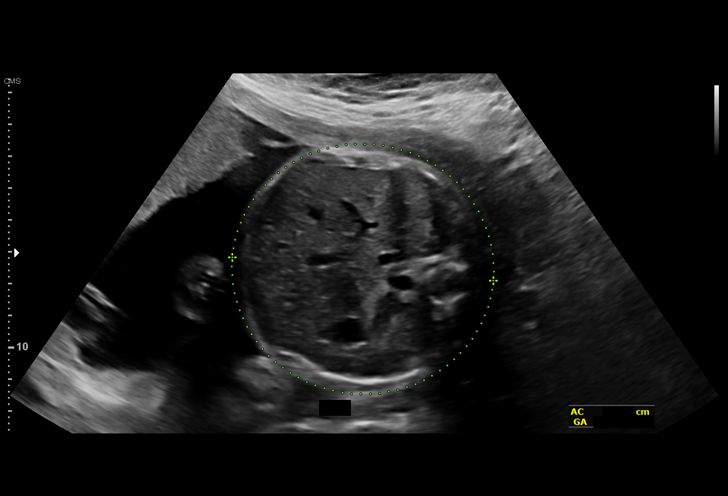
[im 36/58]
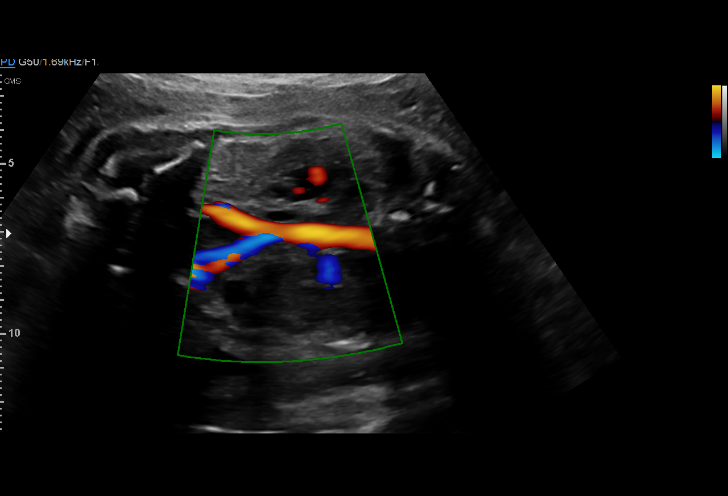
[im 41/58]
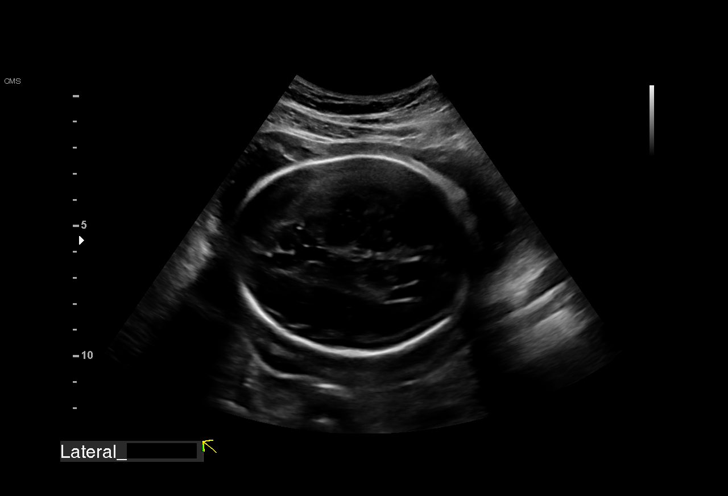
[im 45/58]
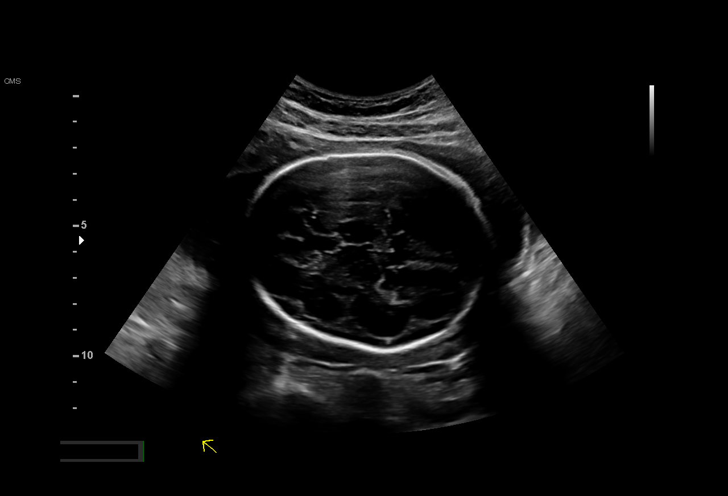
[im 49/58]
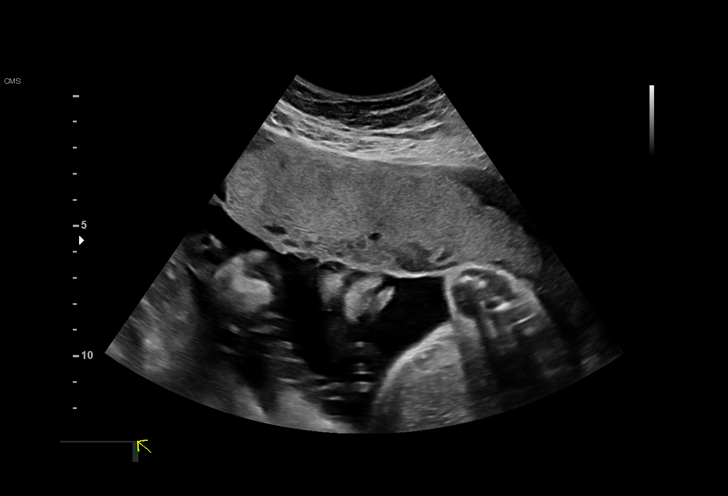
[im 53/58]
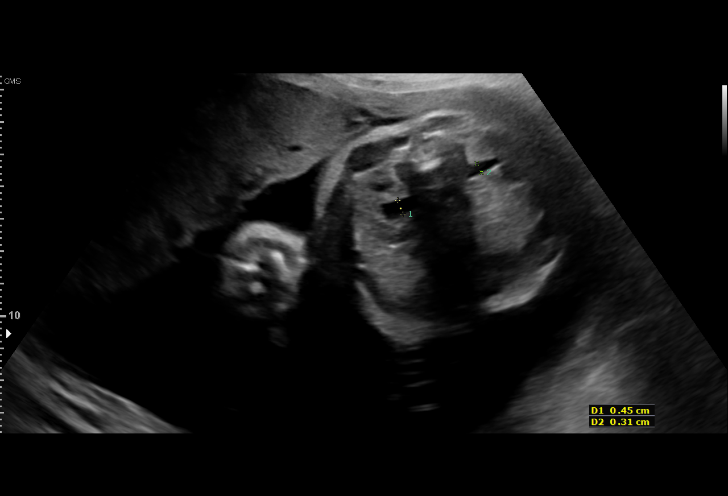
[im 58/58]
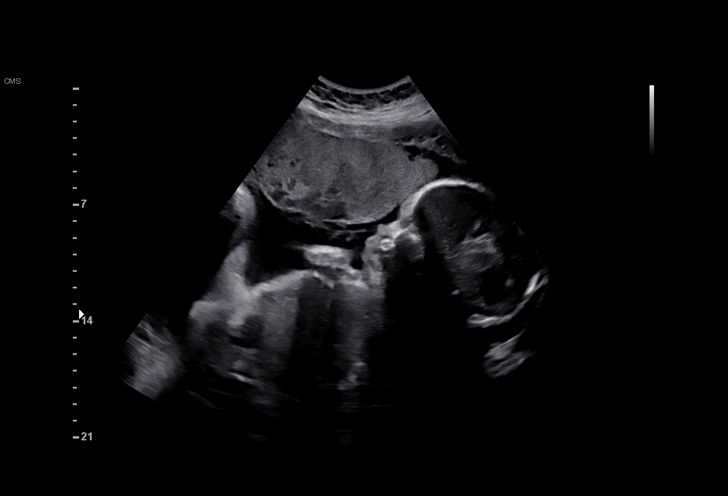

[14 of 28 positions shown; findings below may reference images not displayed]

[REDACTED]care -
[HOSPITAL]([HOSPITAL]
)

1  MOISEA ANTOLIN           [id number]      [id number]     [id number]
Indications

28 weeks gestation of pregnancy
Pre-existing diabetes, type 2, in pregnancy,
third trimester
OB History

Gravidity:    5         Term:   4        Prem:   0        SAB:   0
TOP:          0       Ectopic:  0        Living: 4
Fetal Evaluation

Num Of Fetuses:     1
Fetal Heart         153
Rate(bpm):
Cardiac Activity:   Observed
Presentation:       Cephalic
Placenta:           Anterior, above cervical os
P. Cord Insertion:  Visualized

Amniotic Fluid
AFI FV:      Subjectively within normal limits

AFI Sum(cm)     %Tile       Largest Pocket(cm)
18.24           70

RUQ(cm)       RLQ(cm)       LUQ(cm)        LLQ(cm)
6.63
Biometry

BPD:      73.9  mm     G. Age:  29w 5d         83  %    CI:        70.11   %    70 - 86
FL/HC:      18.8   %    18.8 -
HC:      281.5  mm     G. Age:  30w 6d         91  %    HC/AC:      1.10        1.05 -
AC:      254.9  mm     G. Age:  29w 5d         84  %    FL/BPD:     71.6   %    71 - 87
FL:       52.9  mm     G. Age:  28w 1d         35  %    FL/AC:      20.8   %    20 - 24
HUM:      49.4  mm     G. Age:  29w 0d         63  %

Est. FW:    2077  gm           3 lb     74  %
Gestational Age

LMP:           53w 1d        Date:  12/21/15                 EDD:   09/26/16
U/S Today:     29w 4d                                        EDD:   03/10/17
Best:          28w 1d     Det. By:  Previous Ultrasound      EDD:   03/20/17
(08/08/16)
Anatomy

Cranium:               Appears normal         Aortic Arch:            Appears normal
Cavum:                 Appears normal         Ductal Arch:            Appears normal
Ventricles:            Appears normal         Diaphragm:              Appears normal
Choroid Plexus:        Appears normal         Stomach:                Appears normal, left
sided
Cerebellum:            Appears normal         Abdomen:                Appears normal
Posterior Fossa:       Appears normal         Abdominal Wall:         Appears nml (cord
insert, abd wall)
Nuchal Fold:           Not applicable (>20    Cord Vessels:           Appears normal (3
wks GA)                                        vessel cord)
Face:                  Appears normal         Kidneys:                Appear normal
(orbits and profile)
Lips:                  Appears normal         Bladder:                Appears normal
Thoracic:              Appears normal         Spine:                  Previously seen
Heart:                 Appears normal         Upper Extremities:      Previously seen
(4CH, axis, and situs
RVOT:                  Appears normal         Lower Extremities:      Previously seen
LVOT:                  Appears normal

Other:  Male gender previously visualized. Heels and 5th digit previously
visualized. Nasal bone visualized.
Impression

Singleton intrauterine pregnancy at 28+1 weeks with type 2
DM
Cephalic presentation
Anterior placenta
Normal amniotic fluid volume
Interval review of the anatomy is normal
Growth remains normal in the 74th percentile; abdominal
circumference is in the 84th percentile
Recommendations

Recommend follow-up ultrasound examination in 4 weeks

## 2019-09-17 NOTE — Telephone Encounter (Signed)
Received fax from pharmacy stating that the Rx for provida ob capsules is not covered by patient plan, the preferred alternative is FOLIVANEOBCAP, CONCEPTOBCAP. Please call/fax/resend with changed medication.Emanuell Morina Zimmerman Rumple, CMA

## 2019-09-21 MED ORDER — FOLIVANE-OB 130-92.4-1 MG PO CAPS: 1.0000 | ORAL_CAPSULE | Freq: Every day | ORAL | 11 refills | Status: DC

## 2019-09-21 NOTE — Addendum Note (Signed)
Addended by: Genia Hotter E on: 09/21/2019 06:00 AM   Modules accepted: Orders

## 2019-10-12 ENCOUNTER — Other Ambulatory Visit: Payer: Self-pay | Admitting: *Deleted

## 2019-10-12 MED ORDER — PRENATAL 27-1 MG PO TABS
1.0000 | ORAL_TABLET | Freq: Every day | ORAL | 11 refills | Status: DC
Start: 2019-10-12 — End: 2020-02-14

## 2019-10-20 IMAGING — US US MFM OB FOLLOW-UP
1 series · 14 of 28 positions shown · non-contrast
Comparison: none

[Series 1: us mfm ob follow-up · 61 acquisitions, 14 frames shown]
[im 3/61]
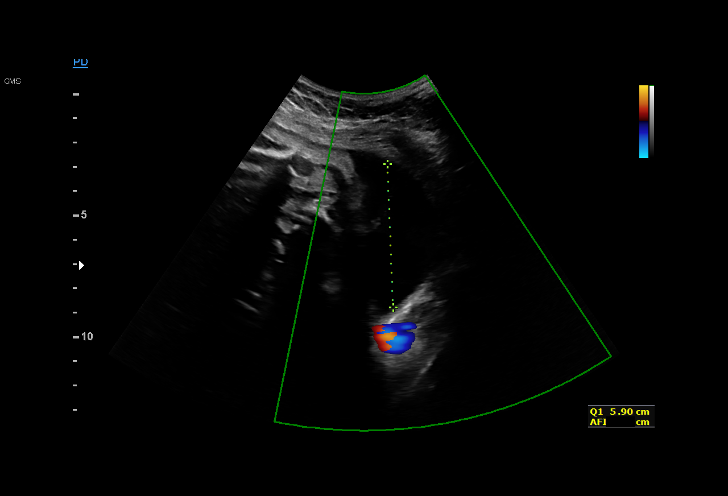
[im 7/61]
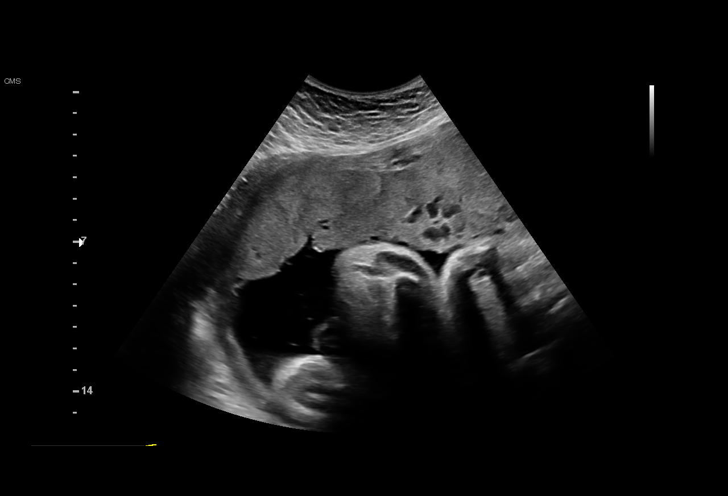
[im 12/61]
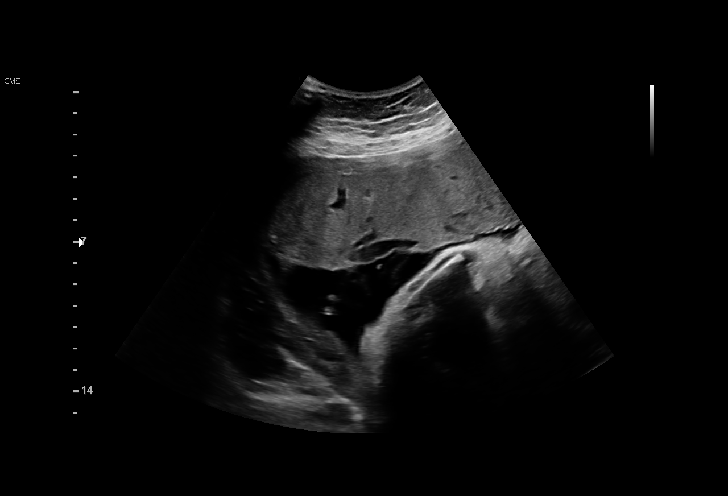
[im 16/61]
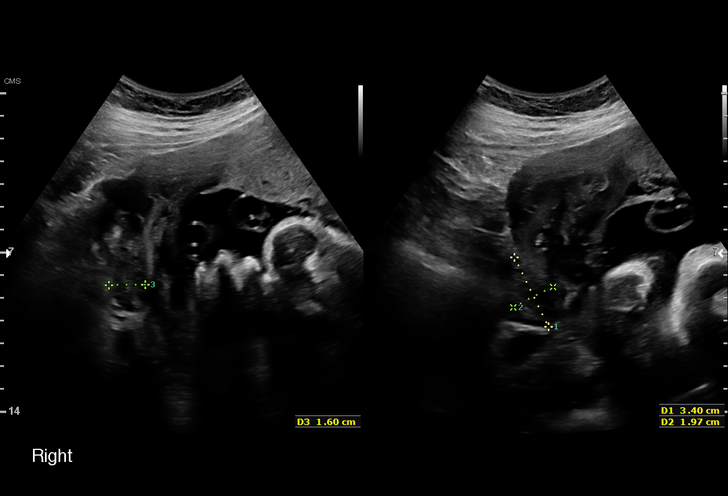
[im 21/61]
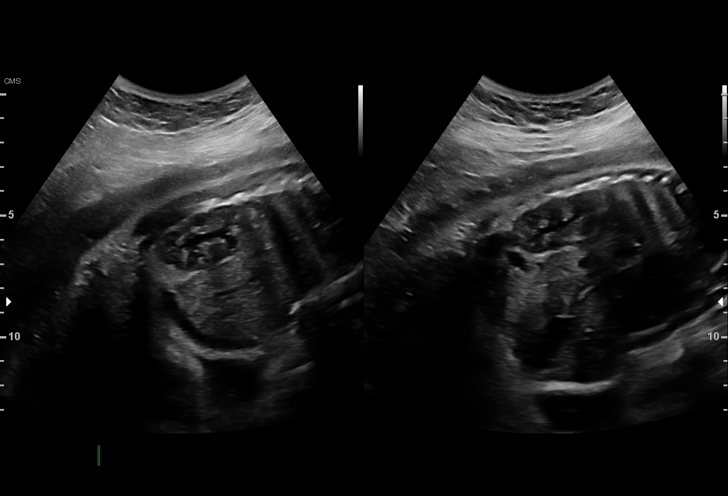
[im 25/61]
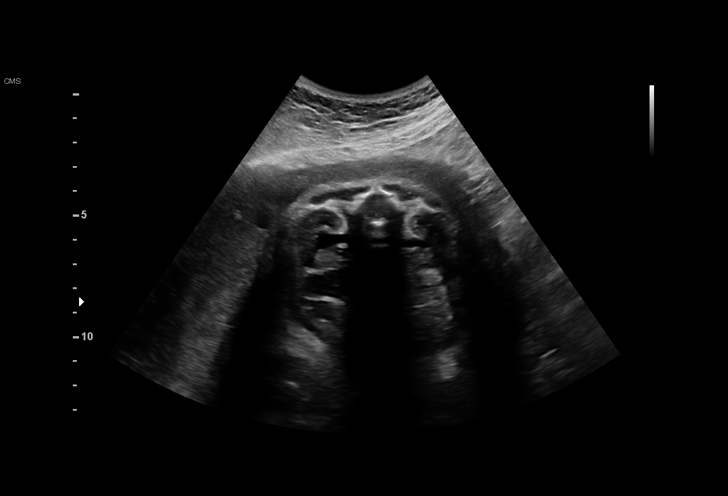
[im 29/61]
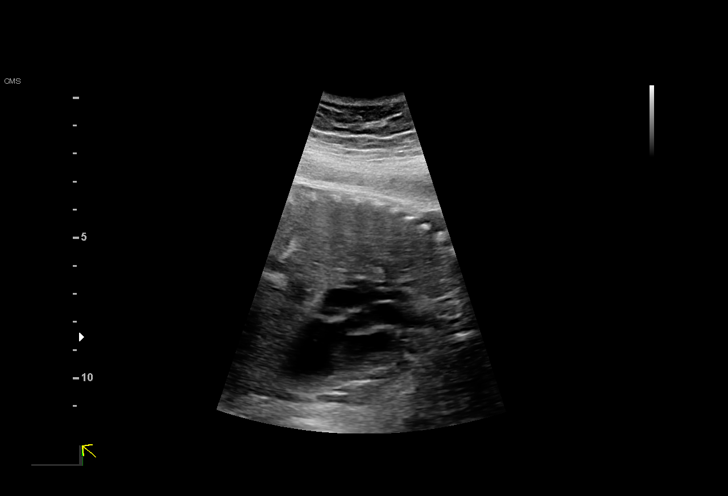
[im 34/61]
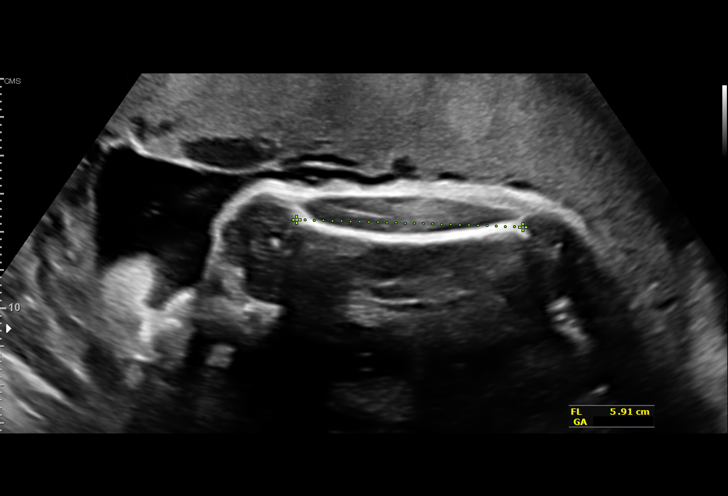
[im 38/61]
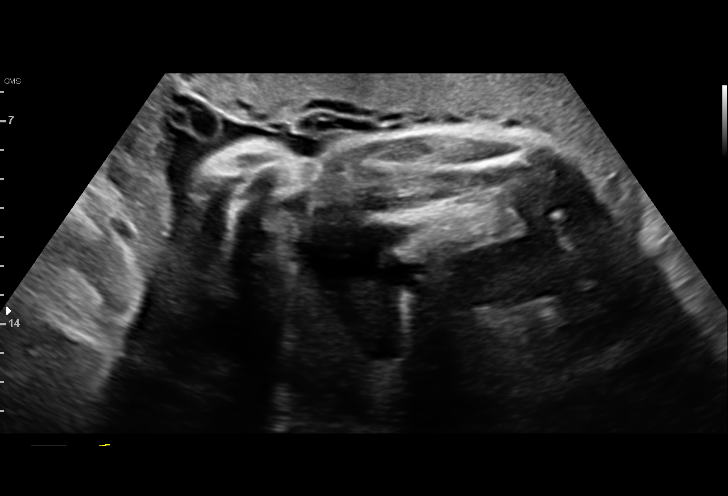
[im 43/61]
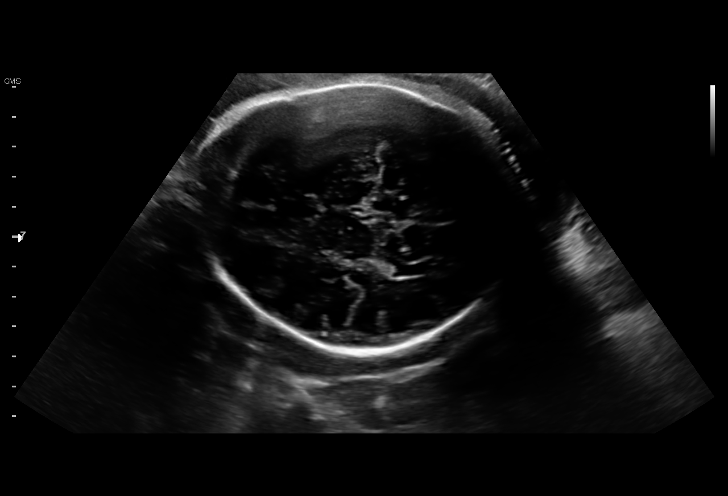
[im 47/61]
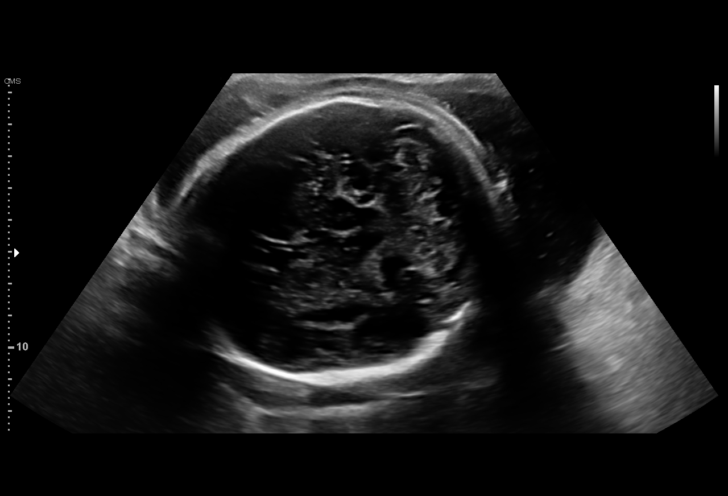
[im 52/61]
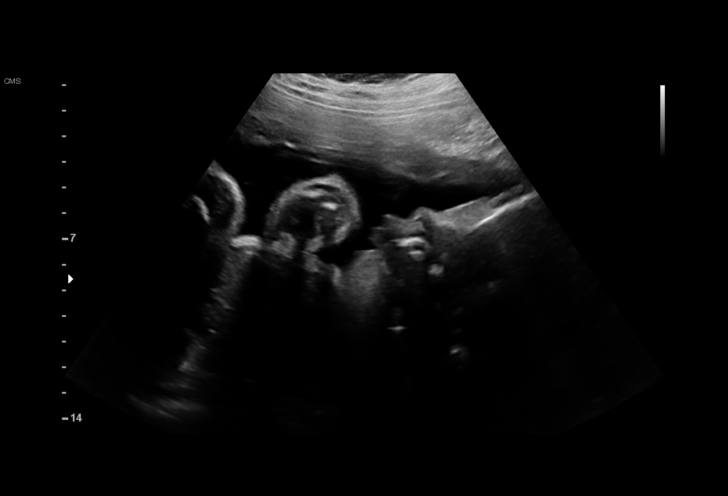
[im 56/61]
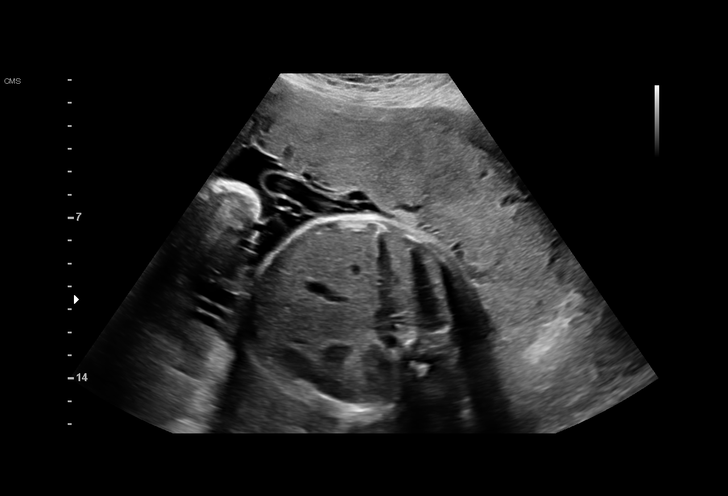
[im 61/61]
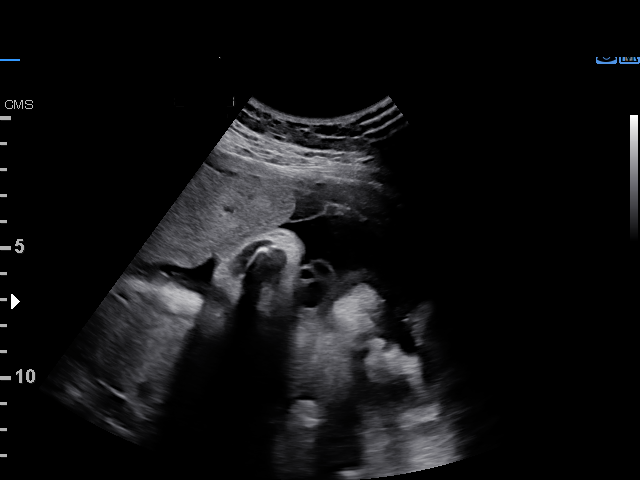

[14 of 28 positions shown; findings below may reference images not displayed]

[REDACTED]care - [HOSPITAL]

1  MENTHOS BARIAH            [id number]      [id number]     [id number]
Indications

32 weeks gestation of pregnancy
Pre-existing diabetes, type 2, in pregnancy,
third trimester (insulin)
OB History

Gravidity:    5         Term:   4        Prem:   0        SAB:   0
TOP:          0       Ectopic:  0        Living: 4
Fetal Evaluation

Num Of Fetuses:     1
Fetal Heart         157
Rate(bpm):
Cardiac Activity:   Observed
Presentation:       Cephalic
Placenta:           Anterior, above cervical os
P. Cord Insertion:  Visualized

Amniotic Fluid
AFI FV:      Subjectively within normal limits

AFI Sum(cm)     %Tile       Largest Pocket(cm)
16.94           62

RUQ(cm)       RLQ(cm)       LUQ(cm)        LLQ(cm)
5.9
Biophysical Evaluation

Amniotic F.V:   Within normal limits       F. Tone:        Observed
F. Movement:    Observed                   Score:          [DATE]
F. Breathing:   Observed
Biometry

BPD:      87.4  mm     G. Age:  35w 2d         96  %    CI:        70.45   %    70 - 86
FL/HC:      17.7   %    19.9 -
HC:       332   mm     G. Age:  37w 6d       > 97  %    HC/AC:      1.06        0.96 -
AC:      313.6  mm     G. Age:  35w 2d       > 97  %    FL/BPD:     67.4   %    71 - 87
FL:       58.9  mm     G. Age:  30w 5d          3  %    FL/AC:      18.8   %    20 - 24
HUM:      56.3  mm     G. Age:  32w 5d         55  %

Est. FW:    0370  gm      5 lb 5 oz     80  %
Gestational Age

LMP:           57w 6d        Date:  12/21/15                 EDD:   09/26/16
U/S Today:     34w 6d                                        EDD:   03/06/17
Best:          32w 6d     Det. By:  Previous Ultrasound      EDD:   03/20/17
(08/08/16)
Anatomy

Cranium:               Appears normal         Aortic Arch:            Appears normal
Cavum:                 Appears normal         Ductal Arch:            Previously seen
Ventricles:            Appears normal         Diaphragm:              Appears normal
Choroid Plexus:        Appears normal         Stomach:                Appears normal, left
sided
Cerebellum:            Appears normal         Abdomen:                Appears normal
Posterior Fossa:       Appears normal         Abdominal Wall:         Previously seen
Nuchal Fold:           Not applicable (>20    Cord Vessels:           Appears normal (3
wks GA)                                        vessel cord)
Face:                  Orbits nl; profile     Kidneys:                Appear normal
prev visualized
Lips:                  Appears normal         Bladder:                Appears normal
Thoracic:              Appears normal         Spine:                  Previously seen
Heart:                 Previously seen        Upper Extremities:      Previously seen
RVOT:                  Appears normal         Lower Extremities:      Previously seen
LVOT:                  Appears normal

Other:  Male gender previously visualized. Heels and 5th digit previously
visualized. Nasal bone visualized.
Cervix Uterus Adnexa

Cervix
Not visualized (advanced GA >55wks)

Uterus
No abnormality visualized.

Left Ovary
Not visualized.

Right Ovary
Within normal limits.
Cul De Sac:   No free fluid seen.
Adnexa:       No abnormality visualized.
Impression

SIUP at 32+6 weeks
Normal interval anatomy; anatomic survey complete
Normal amniotic fluid volume
Appropriate interval growth with EFW at the 80th %tile; AC >
97th %tile
BPP [DATE]
Recommendations

Continue antenatal testing.
Follow-up ultrasound for growth in 4 weeks

## 2019-10-30 ENCOUNTER — Ambulatory Visit (HOSPITAL_COMMUNITY)
Admission: EM | Admit: 2019-10-30 | Discharge: 2019-10-30 | Disposition: A | Discharge status: Home / Self Care | Payer: Medicaid Other | Attending: Emergency Medicine | Admitting: Emergency Medicine

## 2019-10-30 ENCOUNTER — Other Ambulatory Visit: Payer: Self-pay

## 2019-10-30 ENCOUNTER — Encounter (HOSPITAL_COMMUNITY): Payer: Self-pay

## 2019-10-30 DIAGNOSIS — R109 Unspecified abdominal pain: Secondary | ICD-10-CM

## 2019-10-30 DIAGNOSIS — N390 Urinary tract infection, site not specified: Secondary | ICD-10-CM | POA: Diagnosis not present

## 2019-10-30 LAB — POCT URINALYSIS DIPSTICK, ED / UC
Bilirubin Urine: NEGATIVE
Glucose, UA: NEGATIVE mg/dL
Ketones, ur: NEGATIVE mg/dL
Leukocytes,Ua: NEGATIVE
Nitrite: NEGATIVE
Protein, ur: 100 mg/dL — AB
Specific Gravity, Urine: >=1.03 (ref 1.005–1.030)
Urobilinogen, UA: 0.2 mg/dL (ref 0.0–1.0)
pH: 5 (ref 5.0–8.0)

## 2019-10-30 MED ORDER — KETOROLAC TROMETHAMINE 10 MG PO TABS: 10.0000 mg | ORAL_TABLET | Freq: Four times a day (QID) | ORAL | 0 refills | Status: DC | PRN

## 2019-10-30 NOTE — ED Triage Notes (Signed)
Pt c/o 6/10 pain in flanks bilat and lower back, urine frequency, dysuriax2 wks.

## 2019-10-30 NOTE — ED Provider Notes (Signed)
[facility]    CSN: [id number] Arrival date & time: 10/30/19  1735      History   Chief Complaint Chief Complaint  Patient presents with  . Urinary Tract Infection    HPI Elizabeth Warren is a 39 y.o. female  Presenting for bilateral low back pain with urinary frequency and burning with urination for the last 2 weeks.  Denies blood in urine, vaginal pain or discharge, abdominal pain, nausea or fever.  Has not taken thing for this.  Endorses history of UTI, kidney stone.  Past Medical History:  Diagnosis Date  . Gestational diabetes   . History of gestational diabetes 09/06/2016  . Hypertension    pregnancy  . NVD (normal vaginal delivery) 09/20/2010  . Pre-existing type 2 diabetes mellitus during pregnancy in second trimester 10/02/2016    Patient Active Problem List   Diagnosis Date Noted  . Low TSH level 09/03/2019  . Type 2 diabetes mellitus without complication, without long-term current use of insulin ([place]) 09/01/2019  . Vaginal itching 09/01/2019  . UTI (urinary tract infection) 11/10/2018  . Obesity (BMI 30-39.9) 10/31/2011    Past Surgical History:  Procedure Laterality Date  . NO PAST SURGERIES      OB History    Gravida  5   Para  5   Term  5   Preterm  0   AB  0   Living  5     SAB  0   TAB  0   Ectopic  0   Multiple  0   Live Births  5            Home Medications    Prior to Admission medications   Medication Sig Start Date End Date Taking? Authorizing Provider  Accu-Chek Softclix Lancets lancets Use as instructed 09/03/19   Gifford Shave, MD  Blood Glucose Monitoring Suppl (ACCU-CHEK GUIDE ME) w/Device KIT 1 Units by Does not apply route every other day. 09/03/19   Gifford Shave, MD  cetirizine (ZYRTEC) 10 MG tablet Take 1 tablet (10 mg total) by mouth daily. 09/01/19   Wilber Oliphant, MD  Cholecalciferol (VITAMIN D3) 10 MCG (400 UNIT) tablet Take 2 tablets (800 Units total) by mouth daily. 11/17/18   Loura Halt  A, NP  glucose blood (ACCU-CHEK GUIDE) test strip Use as instructed 09/03/19   Gifford Shave, MD  HYDROcodone-acetaminophen (NORCO/VICODIN) 5-325 MG tablet Take 1 tablet by mouth every 8 (eight) hours as needed for moderate pain. 09/05/19   Rosemarie Ax, MD  ibuprofen (ADVIL) 600 MG tablet Take 1 tablet (600 mg total) by mouth every 8 (eight) hours as needed. 09/05/19   Rosemarie Ax, MD  ketorolac (TORADOL) 10 MG tablet Take 1 tablet (10 mg total) by mouth every 6 (six) hours as needed. 10/30/19   Hall-Potvin, [place], PA-C  metFORMIN (GLUCOPHAGE-XR) 500 MG 24 hr tablet Take 1 tablet (500 mg total) by mouth 2 (two) times daily. Will take 1 tablet with breakfast for 2 weeks and then increase to 1 tablet with breakfast and 1 tablet with dinner after 2 weeks 09/03/19   Gifford Shave, MD  Prenat w/o A Vit-FeFum-FePo-FA (FOLIVANE-OB) 130-92.4-1 MG CAPS Take 1 capsule by mouth daily. Between meals 09/21/19   Wilber Oliphant, MD  Prenat-Fe Poly-Methfol-FA-DHA (VITAFOL ULTRA) 29-0.6-0.4-200 MG CAPS Take 1 capsule by mouth daily before breakfast. 06/23/19   Shelly Bombard, MD  Prenat-FeFum-FePo-FA-DHA w/o A (PROVIDA DHA) 16-16-1.25-110 MG CAPS Take 1 capsule by  mouth daily. 09/16/19   Wilber Oliphant, MD  Prenatal 27-1 MG TABS Take 1 tablet by mouth daily. 10/12/19   Wilber Oliphant, MD    Family History Family History  Problem Relation Age of Onset  . Diabetes Mother   . Hypertension Mother   . Diabetes Father   . Hypertension Father   . Diabetes Maternal Grandmother   . Hypertension Maternal Grandmother   . Diabetes Maternal Grandfather   . Hypertension Maternal Grandfather   . Diabetes Paternal Grandmother   . Hypertension Paternal Grandmother   . Diabetes Paternal Grandfather   . Hypertension Paternal Grandfather   . Cancer Neg Hx   . Heart disease Neg Hx   . Stroke Neg Hx     Social History Social History   Tobacco Use  . Smoking status: Never Smoker  . Smokeless tobacco: Never  Used  Vaping Use  . Vaping Use: Never used  Substance Use Topics  . Alcohol use: No  . Drug use: No     Allergies   Patient has no known allergies.   Review of Systems As per HPI   Physical Exam Triage Vital Signs ED Triage Vitals  Enc Vitals Group     BP      Pulse      Resp      Temp      Temp src      SpO2      Weight      Height      Head Circumference      Peak Flow      Pain Score      Pain Loc      Pain Edu?      Excl. in [place]?    No data found.  Updated Vital Signs BP 136/75   Pulse (!) 114   Temp 98.1 F (36.7 C) (Oral)   Resp 18   Ht 5' 3"  (1.6 m)   Wt 207 lb (93.9 kg)   SpO2 98%   BMI 36.67 kg/m   Visual Acuity Right Eye Distance:   Left Eye Distance:   Bilateral Distance:    Right Eye Near:   Left Eye Near:    Bilateral Near:     Physical Exam Constitutional:      General: She is not in acute distress. HENT:     Head: Normocephalic and atraumatic.  Eyes:     General: No scleral icterus.    Pupils: Pupils are equal, round, and reactive to light.  Cardiovascular:     Rate and Rhythm: Normal rate.  Pulmonary:     Effort: Pulmonary effort is normal.  Abdominal:     General: Bowel sounds are normal.     Palpations: Abdomen is soft.     Tenderness: There is no abdominal tenderness. There is right CVA tenderness and left CVA tenderness. There is no guarding.     Comments: mild  Skin:    Coloration: Skin is not jaundiced or pale.  Neurological:     Mental Status: She is alert and oriented to person, place, and time.      UC Treatments / Results  Labs (all labs ordered are listed, but only abnormal results are displayed) Labs Reviewed  POCT URINALYSIS DIPSTICK, ED / UC - Abnormal; Notable for the following components:      Result Value   Hgb urine dipstick LARGE (*)    Protein, ur 100 (*)    All other components within normal  limits    EKG   Radiology No results found.  Procedures Procedures (including critical  care time)  Medications Ordered in UC Medications - No data to display  Initial Impression / Assessment and Plan / UC Course  I have reviewed the triage vital signs and the nursing notes.  Pertinent labs & imaging results that were available during my care of the patient were reviewed by me and considered in my medical decision making (see chart for details).     Urine with blood, protein.  Will treat supportively for suspected renal colic/kidney stone.  Push fluids.  Return precautions discussed, pt verbalized understanding and is agreeable to plan. Final Clinical Impressions(s) / UC Diagnoses   Final diagnoses:  Bilateral flank pain   Discharge Instructions   None    ED Prescriptions    Medication Sig Dispense Auth. Provider   ketorolac (TORADOL) 10 MG tablet Take 1 tablet (10 mg total) by mouth every 6 (six) hours as needed. 20 tablet Hall-Potvin, [place], PA-C     PDMP not reviewed this encounter.   Hall-Potvin, [place], [place] 10/31/19 [phone]

## 2019-10-31 ENCOUNTER — Encounter (HOSPITAL_COMMUNITY): Payer: Self-pay | Admitting: Emergency Medicine

## 2019-11-01 IMAGING — US US OB LIMITED
1 series · 7 of 7 positions shown · non-contrast
Comparison: none

[Series 1: us ob limited · 0.20mm/px · 7 of 7 slices shown]
[im 1/7]
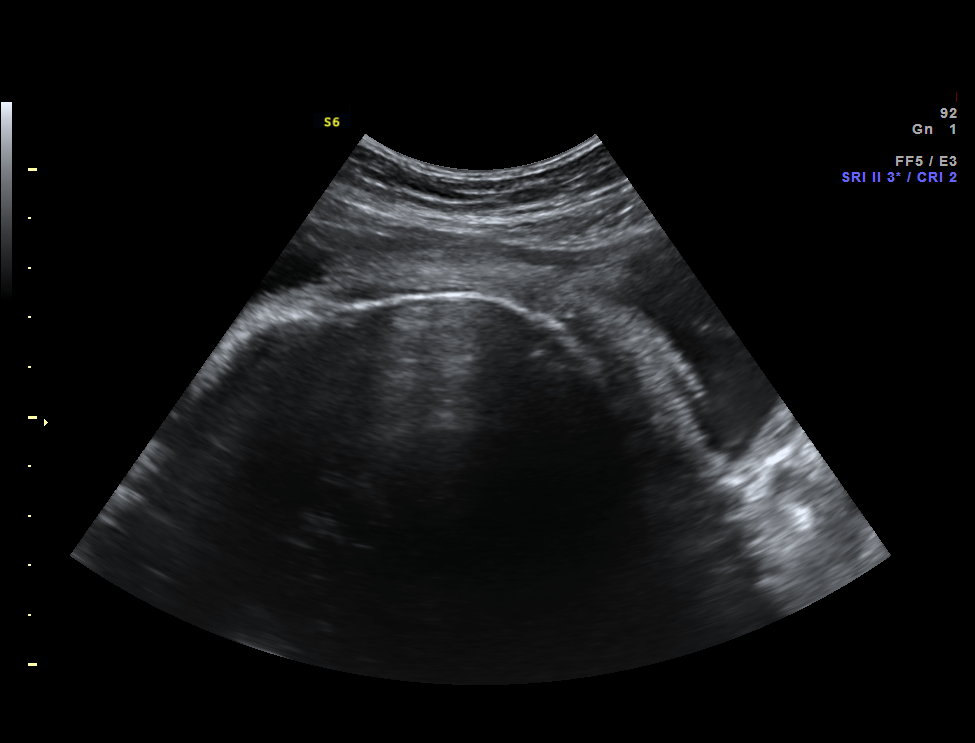
[im 2/7]
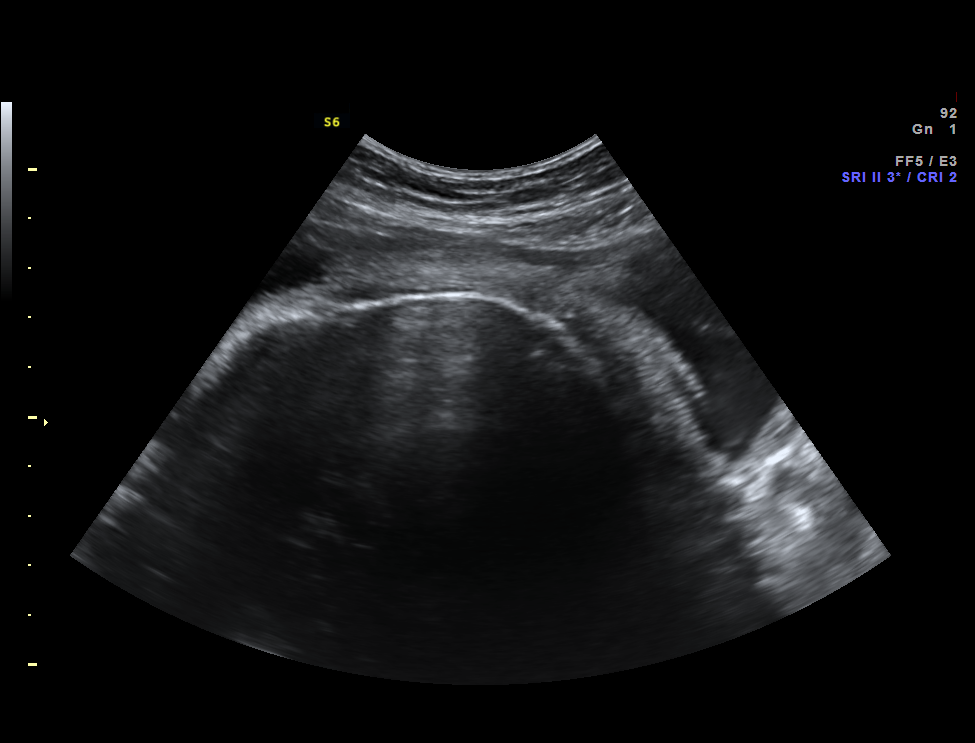
[im 3/7]
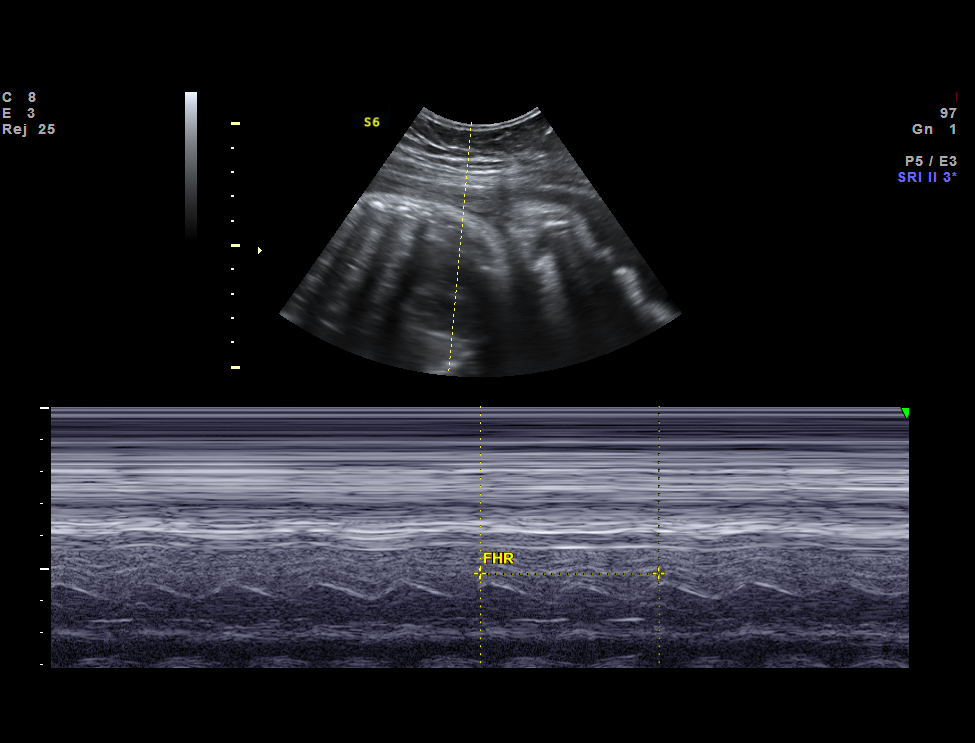
[im 4/7]
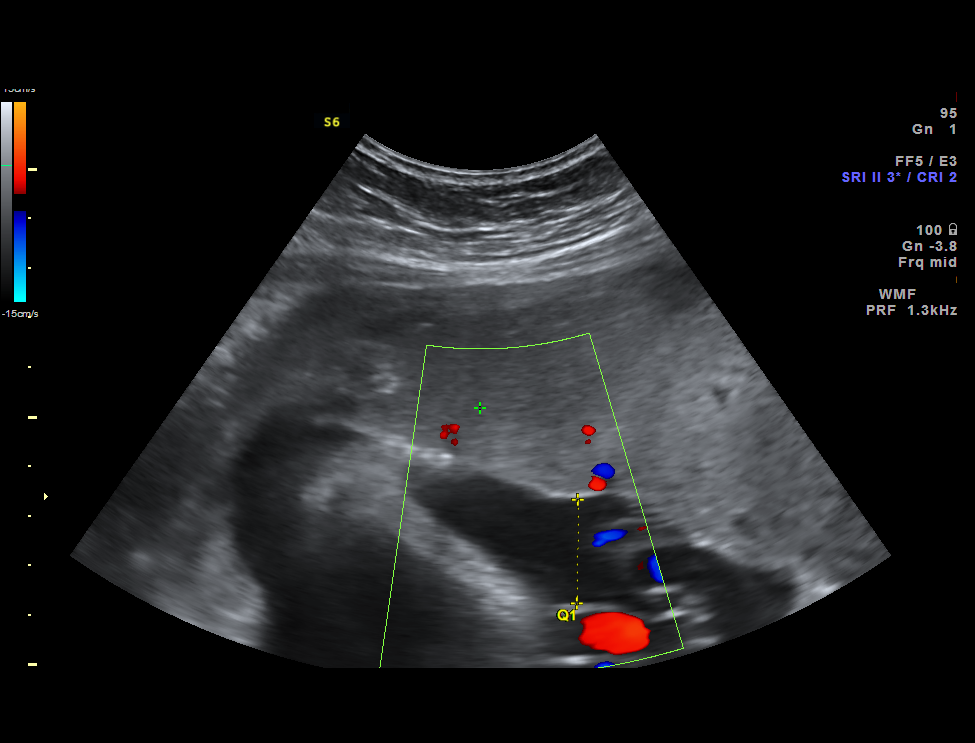
[im 5/7]
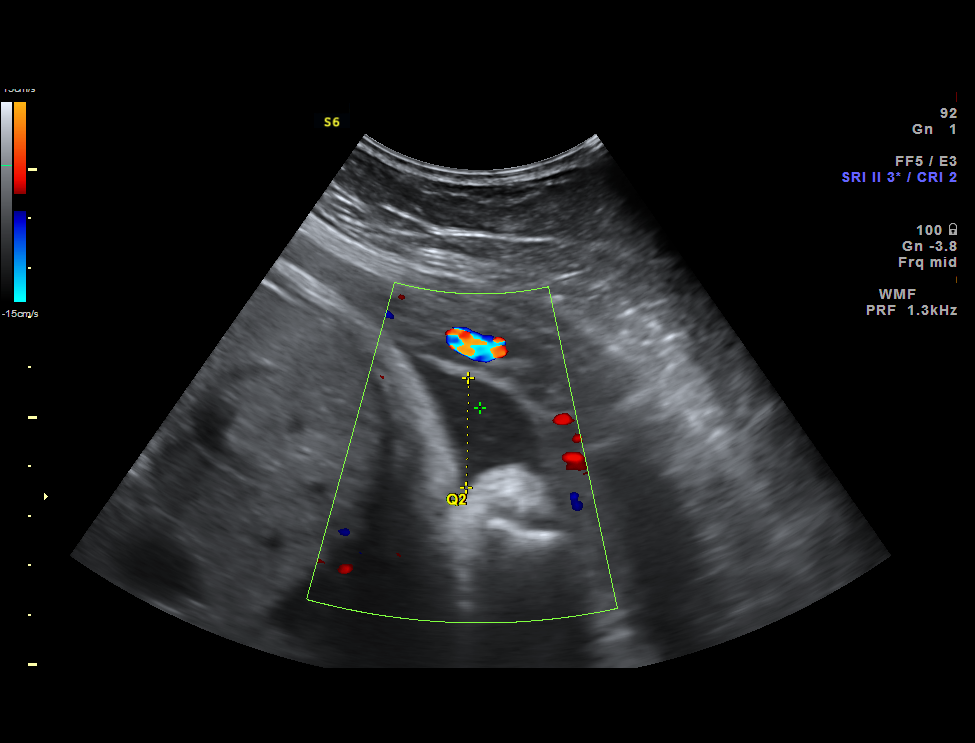
[im 6/7]
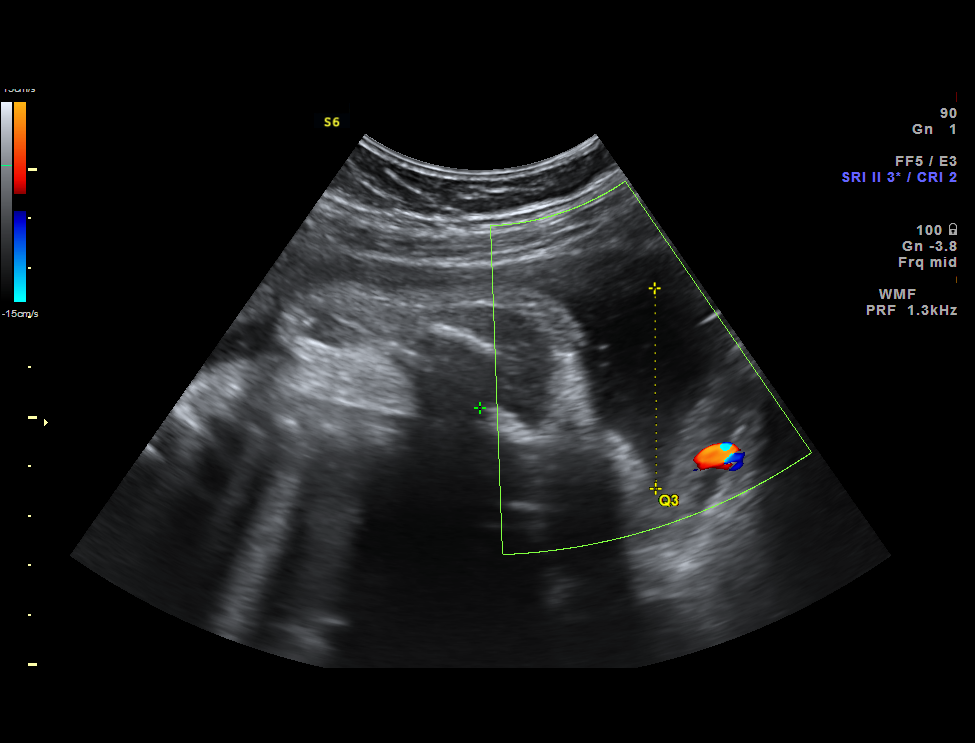
[im 7/7]
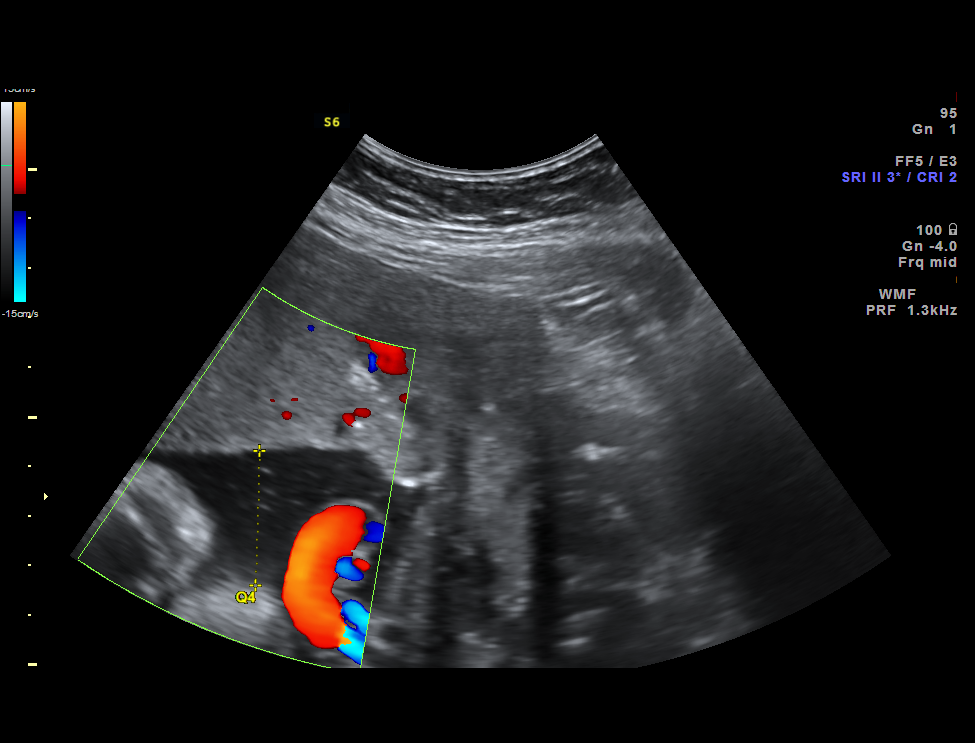

[7 of 7 positions shown; findings below may reference images not displayed]

CMA
[REDACTED]care
[REDACTED]care - [HOSPITAL]

1  [HOSPITAL]                               [id number]

1  ZACHERY STANDIFER           [id number]      [id number]     [id number]
Indications

34 weeks gestation of pregnancy
Pre-existing diabetes, type 2, in pregnancy,
third trimester
OB History

Gravidity:    5         Term:   4        Prem:   0        SAB:   0
TOP:          0       Ectopic:  0        Living: 4
Fetal Evaluation

Num Of Fetuses:     1
Fetal Heart         151
Rate(bpm):
Cardiac Activity:   Observed
Presentation:       Cephalic

Amniotic Fluid
AFI FV:      Subjectively within normal limits

AFI Sum(cm)     %Tile       Largest Pocket(cm)
11.09           28

RUQ(cm)       RLQ(cm)       LUQ(cm)        LLQ(cm)
2.1
Gestational Age

LMP:           59w 4d        Date:  12/21/15                 EDD:   09/26/16
Best:          34w 4d     Det. By:  Previous Ultrasound      EDD:   03/20/17
(08/08/16)
Comments

Normal amniotic fluid volume.
Impression

Viable fetus in cephalic presentation
Normal AFI
Recommendations

Continue antenatal testing.
Growth ultrasound scheduled 02/26/2017
Recommend delivery by 39 weeks if maternal and fetal status
remain reassuring.

## 2019-11-15 ENCOUNTER — Ambulatory Visit: Payer: Medicaid Other | Admitting: Family Medicine

## 2019-11-17 IMAGING — US US MFM OB FOLLOW-UP
1 series · 13 of 28 positions shown · non-contrast
Comparison: none

[Series 1: us mfm ob follow-up · 48 acquisitions, 13 frames shown]
[im 2/48]
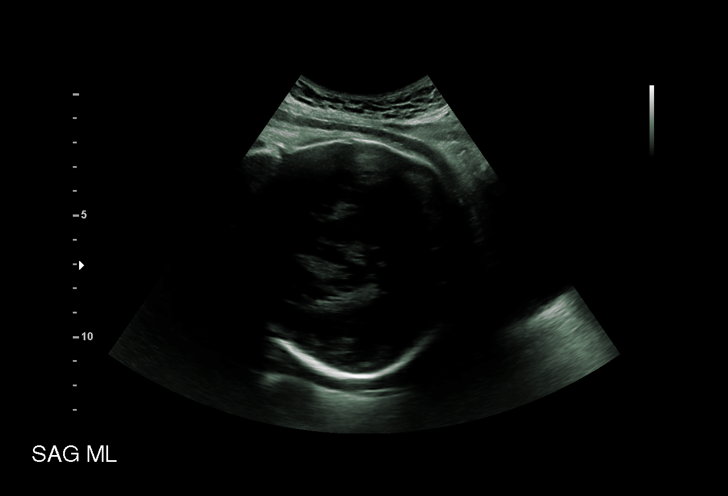
[im 6/48]
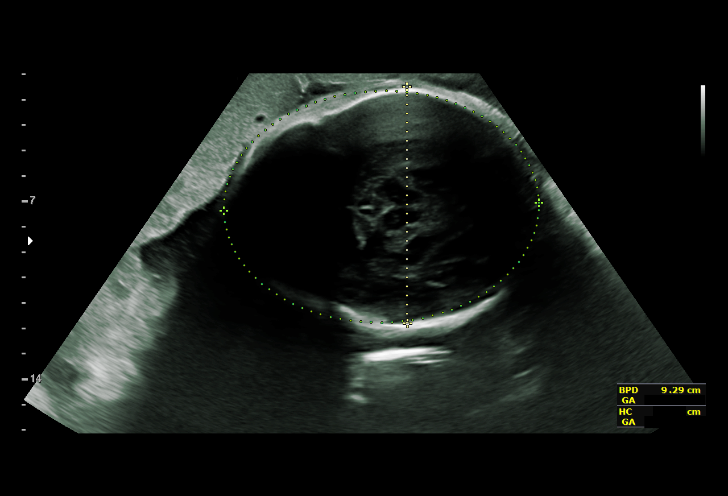
[im 9/48]
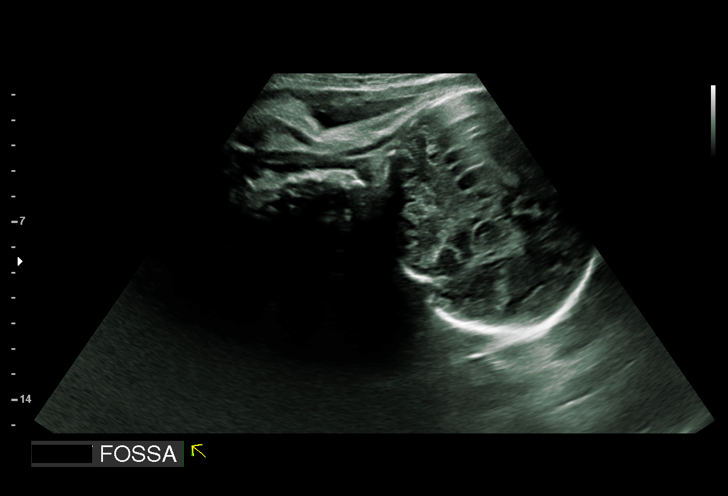
[im 13/48]
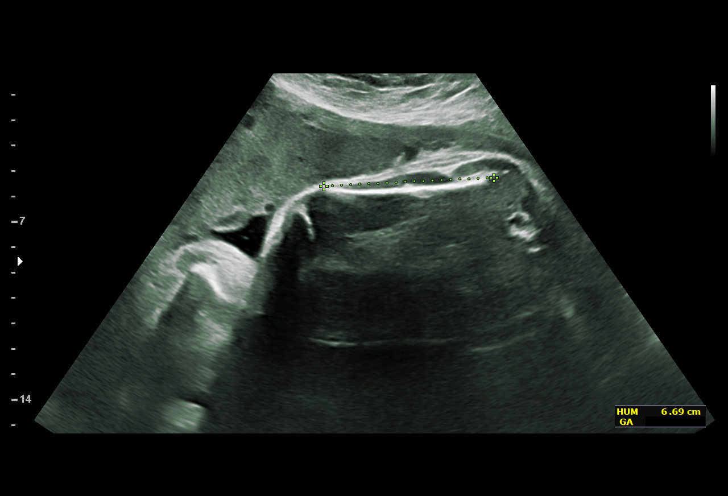
[im 16/48]
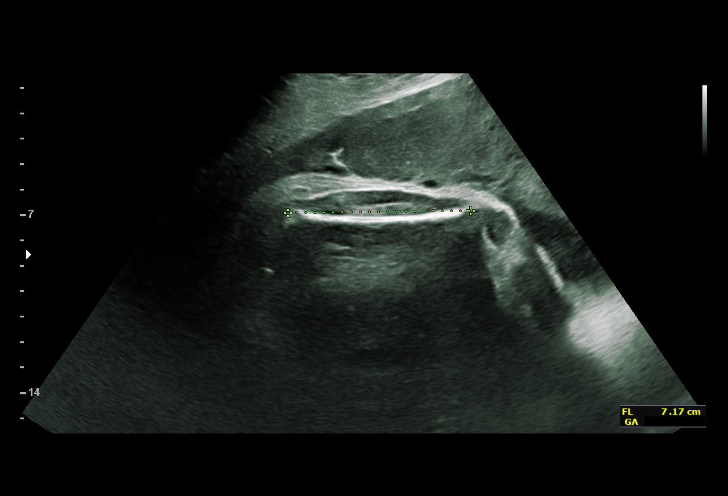
[im 20/48]
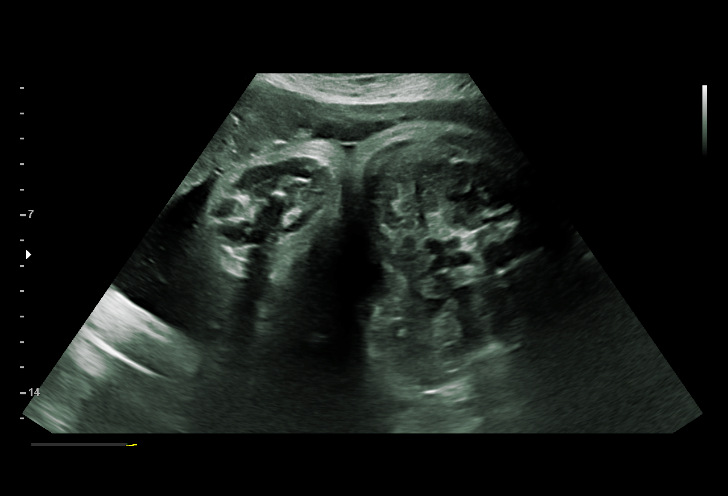
[im 25/48]
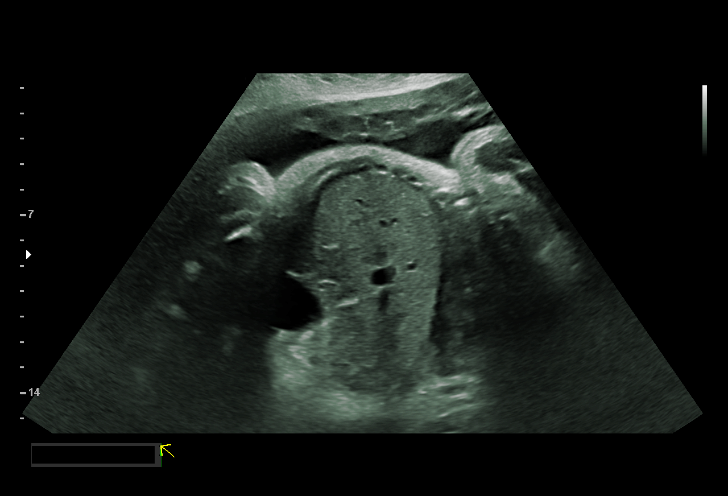
[im 28/48]
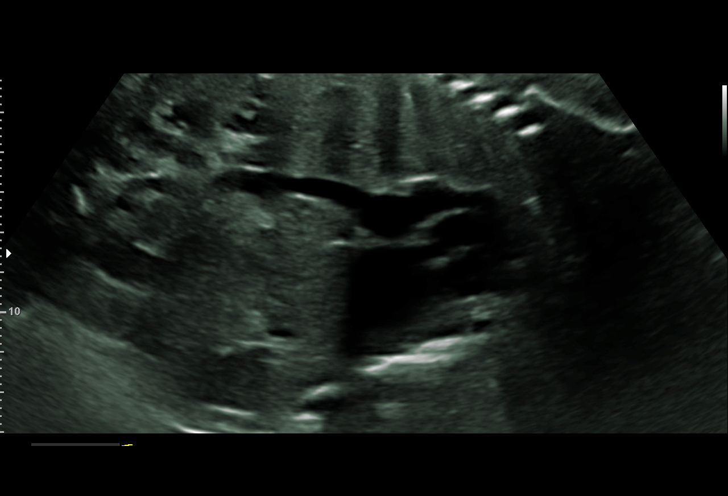
[im 32/48]
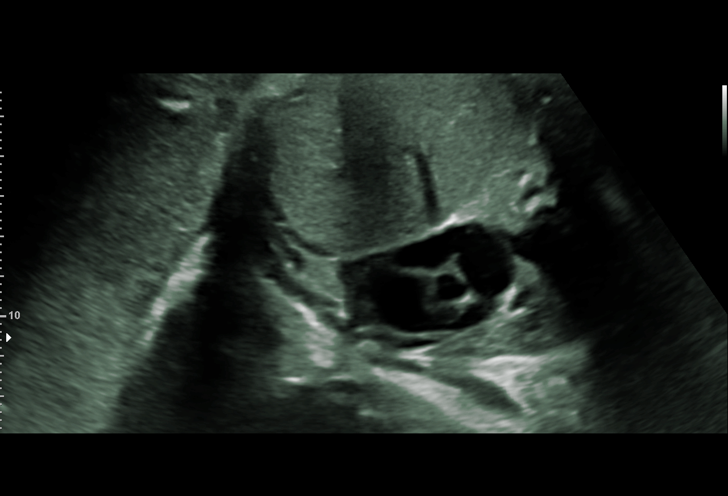
[im 35/48]
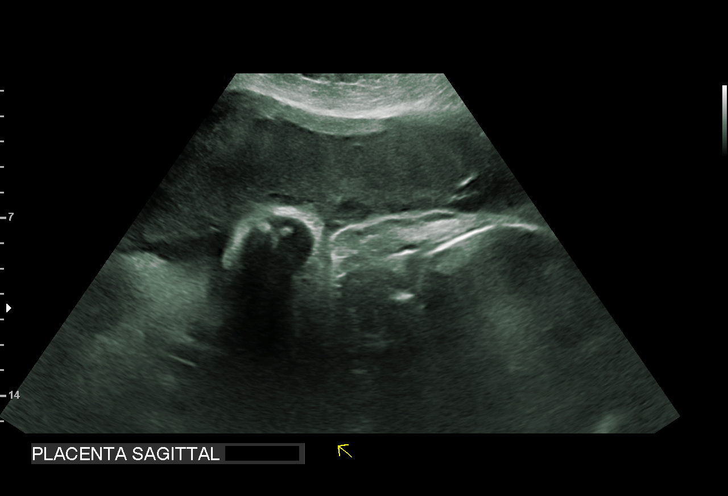
[im 39/48]
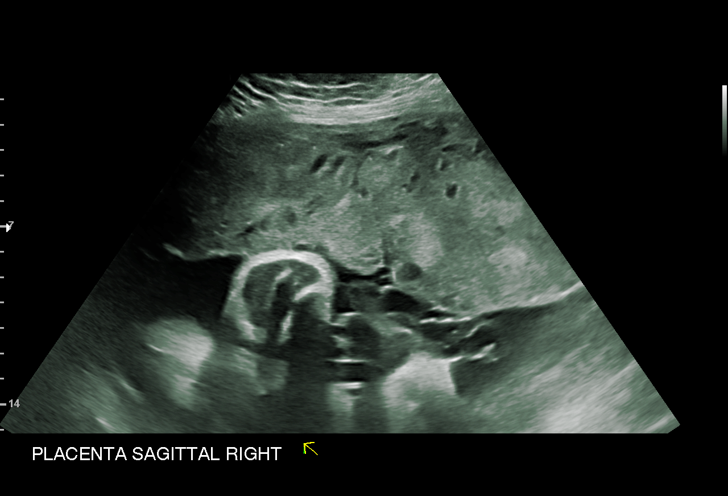
[im 42/48]
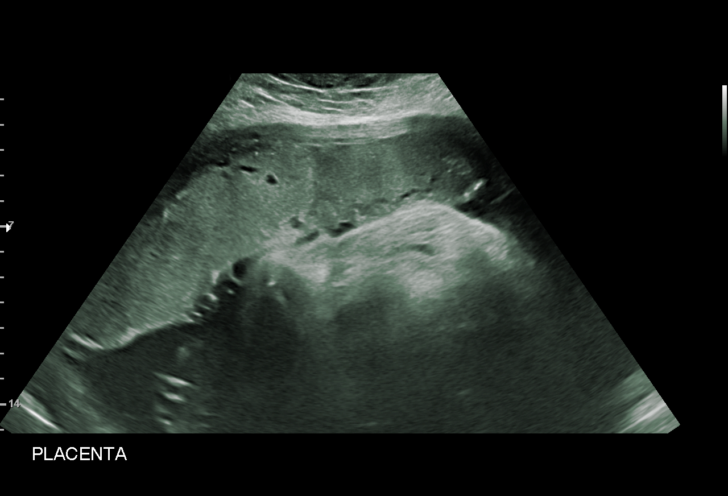
[im 46/48]
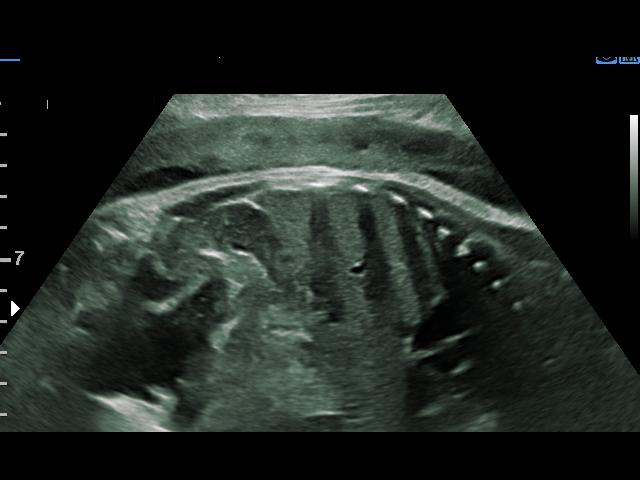

[13 of 28 positions shown; findings below may reference images not displayed]

[REDACTED]care - [HOSPITAL]

1  INGUNN HARPA RONLOR            [id number]      [id number]     [id number]
2  VREZH SELKIRK           [id number]      [id number]     [id number]
Indications

36 weeks gestation of pregnancy
Pre-existing diabetes, type 2, in pregnancy,
third trimester
Fetal echo - normal
OB History

Gravidity:    5         Term:   4        Prem:   0        SAB:   0
TOP:          0       Ectopic:  0        Living: 4
Fetal Evaluation

Num Of Fetuses:     1
Fetal Heart         143
Rate(bpm):
Cardiac Activity:   Observed
Presentation:       Cephalic
Placenta:           Anterior, above cervical os
P. Cord Insertion:  Visualized, central

Amniotic Fluid
AFI FV:      Subjectively within normal limits

AFI Sum(cm)     %Tile       Largest Pocket(cm)
18.16           69

RUQ(cm)                     LUQ(cm)        LLQ(cm)
3.8
Biophysical Evaluation

Amniotic F.V:   Within normal limits       F. Tone:        Observed
F. Movement:    Observed                   N.S.T:          Reactive
F. Breathing:   Not Observed               Score:          [DATE]
Biometry

BPD:      94.7  mm     G. Age:  38w 4d         95  %    CI:        73.85   %    70 - 86
FL/HC:      20.5   %    20.8 -
HC:       350   mm     G. Age:  40w 5d         95  %    HC/AC:      1.01        0.92 -
AC:      345.8  mm     G. Age:  38w 3d         93  %    FL/BPD:     75.6   %    71 - 87
FL:       71.6  mm     G. Age:  36w 5d         44  %    FL/AC:      20.7   %    20 - 24
HUM:      65.5  mm     G. Age:  38w 0d         91  %
CER:      52.9  mm     G. Age:  N/A          > 95  %

Est. FW:    7134  gm    7 lb 10 oz    > 90  %
Gestational Age

LMP:           61w 6d        Date:  12/21/15                 EDD:   09/26/16
U/S Today:     38w 4d                                        EDD:   03/08/17
Best:          36w 6d     Det. By:  Previous Ultrasound      EDD:   03/20/17
(08/08/16)
Anatomy

Cranium:               Appears normal         Aortic Arch:            Previously seen
Cavum:                 Appears normal         Ductal Arch:            Previously seen
Ventricles:            Appears normal         Diaphragm:              Appears normal
Choroid Plexus:        Previously seen        Stomach:                Appears normal, left
sided
Cerebellum:            Appears normal         Abdomen:                Appears normal
Posterior Fossa:       Appears normal         Abdominal Wall:         Previously seen
Nuchal Fold:           Not applicable (>20    Cord Vessels:           Appears normal (3
wks GA)                                        vessel cord)
Face:                  Orbits nl; profile     Kidneys:                Appear normal
prev visualized
Lips:                  Appears normal         Bladder:                Appears normal
Thoracic:              Appears normal         Spine:                  Previously seen
Heart:                 Appears normal         Upper Extremities:      Previously seen
(4CH, axis, and situs
RVOT:                  Appears normal         Lower Extremities:      Previously seen
LVOT:                  Appears normal

Other:  Male gender previously visualized. Heels and 5th digit previously
visualized. Nasal bone visualized.
Cervix Uterus Adnexa

Cervix
Not visualized (advanced GA >26wks)

Left Ovary
Not visualized.

Right Ovary
Previously seen
Impression

SIUP at 54w4d, gestation complicated by DM
active fetus
EFW >90th%'le, AC 
1SD above the mean
AFI is normal
no defects seen
Recommendations

Repeat BPP in 1 week
Given growth trajectory in setting of DM, I would recommend
assessing an EFW at 53w6d (in 2wks) prior to delivery at 39
weeks.

## 2019-12-05 NOTE — Progress Notes (Signed)
° °  SUBJECTIVE:  CHIEF COMPLAINT / HPI:   1. Type 2 diabetes mellitus without complication, without long-term current use of insulin (HCC) Last seen for diabetes 3 months ago.  Management since then includes: At last visit, patient started on metformin XR 500 mg twice daily. Contemplated GLP-1, but ultimately declined. Also takes fenugreek supplement.  She reports fair compliance with treatment. She is having side effects, but is tolerating just fine. Side effects is diarrhea.  Denies fatigue, nausea/vmoiting, appetite changes, paraesthesias, polyuria/polydipsia, visual changes. Denies hypoglycemia with tremulousness, diaphoresis, tachycardia. Reports diabetic foot checks at home.  Current exercise: walking 20 minutes everyday  Current diet habits: diabetic Wt Readings from Last 3 Encounters:  12/06/19 205 lb 3.2 oz (93.1 kg)  10/30/19 207 lb (93.9 kg)  09/05/19 200 lb (90.7 kg)   Home blood sugar records: reports sometimes will check at home  Episodes of hypoglycemia? No  Most Recent Eye Exam: Dr. Teena Dunk at [facility] for check this month  Last Diabetic Foot Check: today, wnl    PERTINENT  PMH / PSH: obesity, DMII, low TSH   OBJECTIVE:  BP 128/88    Pulse (!) 106    Ht 5' (1.524 m)    Wt 205 lb 3.2 oz (93.1 kg)    SpO2 99%    BMI 40.08 kg/m   General: NAD, non-toxic, well-appearing, sitting comfortably in chair   HEENT: [place]/AT. PERRLA. EOMI.  Cardiovascular: RRR, normal S1, S2. B/L 2+ RP. No BLEE Respiratory: CTAB. No IWOB.  Abdomen: + BS. NT, ND, soft to palpation.  Extremities: Warm and well perfused. Moving spontaneously.  Integumentary: No obvious rashes, lesions, trauma on general exam. Neuro: A & O x4. CN grossly intact. No FND Diabetic foot exam was performed with the following findings:   No deformities, ulcerations, or other skin breakdown Normal sensation of 10g monofilament Intact posterior tibialis and dorsalis pedis pulses     ASSESSMENT/PLAN:  Type 2  diabetes mellitus without complication, without long-term current use of insulin (HCC) A1c 8.0 from 7.7 today. No significant medication side effects noted and reasonably well controlled. Continue metformin 500 mg XR BID and diet. Increase exercise. Normal foot exam. Discussed weight trend. Increase exercise. Take metformin total 1000 mg daily and she should not dose depnding on what she eats.  -Medications: If >8 at next visit, consider icnreasing to 1500 mg daily and/or adding GLP-1. Pt would like to come off of metformin, but discussed expectations for needing the medication for the next few years.    -Encouraged compliance with medications, and adherence to diet and exercise recommendations. -Follow-up in 3 months and bring medications to next appointment. -Lipid panel, BMP   Low TSH level Recheck TSH and T4 today    Patient left appt today prior to getting labs. Will obtain at next visit.  Called patient and reviewed medications and updated. Also, given instructions from today's visit.   Melene Plan, MD [facility]

## 2019-12-06 ENCOUNTER — Encounter: Payer: Self-pay | Admitting: Family Medicine

## 2019-12-06 ENCOUNTER — Ambulatory Visit (INDEPENDENT_AMBULATORY_CARE_PROVIDER_SITE_OTHER): Payer: Medicaid Other | Admitting: Family Medicine

## 2019-12-06 ENCOUNTER — Other Ambulatory Visit: Payer: Self-pay

## 2019-12-06 VITALS — BP 128/88 | HR 106 | Ht 60.0 in | Wt 205.2 lb

## 2019-12-06 DIAGNOSIS — E669 Obesity, unspecified: Secondary | ICD-10-CM

## 2019-12-06 DIAGNOSIS — E119 Type 2 diabetes mellitus without complications: Secondary | ICD-10-CM | POA: Diagnosis not present

## 2019-12-06 DIAGNOSIS — R7989 Other specified abnormal findings of blood chemistry: Secondary | ICD-10-CM

## 2019-12-06 LAB — POCT GLYCOSYLATED HEMOGLOBIN (HGB A1C): Hemoglobin A1C: 8 % — AB (ref 4.0–5.6)

## 2019-12-06 NOTE — Patient Instructions (Signed)
Please continue to take your metformin daily. Make sure to take two tablets daily no matter what you eat.  Continue low sugar diet.  Increase exercise to 30 minutes daily.  Follow up in 3 months

## 2019-12-06 NOTE — Assessment & Plan Note (Signed)
A1c 8.0 from 7.7 today. No significant medication side effects noted and reasonably well controlled. Continue metformin 500 mg XR BID and diet. Increase exercise. Normal foot exam. Discussed weight trend. Increase exercise. Take metformin total 1000 mg daily and she should not dose depnding on what she eats.  -Medications: If >8 at next visit, consider icnreasing to 1500 mg daily and/or adding GLP-1. Pt would like to come off of metformin, but discussed expectations for needing the medication for the next few years.    -Encouraged compliance with medications, and adherence to diet and exercise recommendations. -Follow-up in 3 months and bring medications to next appointment. -Lipid panel, BMP

## 2019-12-06 NOTE — Assessment & Plan Note (Signed)
Recheck TSH and T4 today

## 2020-01-12 ENCOUNTER — Other Ambulatory Visit: Payer: Self-pay

## 2020-01-12 ENCOUNTER — Emergency Department (HOSPITAL_COMMUNITY)
Admission: EM | Admit: 2020-01-12 | Discharge: 2020-01-12 | Disposition: A | Discharge status: Home / Self Care | Payer: Medicaid Other | Attending: Emergency Medicine | Admitting: Emergency Medicine

## 2020-01-12 ENCOUNTER — Emergency Department (HOSPITAL_COMMUNITY): Payer: Medicaid Other

## 2020-01-12 ENCOUNTER — Encounter (HOSPITAL_COMMUNITY): Payer: Self-pay

## 2020-01-12 DIAGNOSIS — K8050 Calculus of bile duct without cholangitis or cholecystitis without obstruction: Secondary | ICD-10-CM

## 2020-01-12 DIAGNOSIS — R1011 Right upper quadrant pain: Secondary | ICD-10-CM | POA: Diagnosis not present

## 2020-01-12 DIAGNOSIS — K7689 Other specified diseases of liver: Secondary | ICD-10-CM | POA: Insufficient documentation

## 2020-01-12 DIAGNOSIS — E119 Type 2 diabetes mellitus without complications: Secondary | ICD-10-CM | POA: Insufficient documentation

## 2020-01-12 DIAGNOSIS — K8020 Calculus of gallbladder without cholecystitis without obstruction: Secondary | ICD-10-CM | POA: Diagnosis not present

## 2020-01-12 DIAGNOSIS — Z7984 Long term (current) use of oral hypoglycemic drugs: Secondary | ICD-10-CM | POA: Insufficient documentation

## 2020-01-12 LAB — URINALYSIS, ROUTINE W REFLEX MICROSCOPIC
Bilirubin Urine: NEGATIVE
Glucose, UA: 50 mg/dL — AB
Hgb urine dipstick: NEGATIVE
Ketones, ur: NEGATIVE mg/dL
Leukocytes,Ua: NEGATIVE
Nitrite: NEGATIVE
Protein, ur: NEGATIVE mg/dL
Specific Gravity, Urine: 1.006 (ref 1.005–1.030)
pH: 6 (ref 5.0–8.0)

## 2020-01-12 LAB — CBC WITH DIFFERENTIAL/PLATELET
Abs Immature Granulocytes: 0.08 10*3/uL — ABNORMAL HIGH (ref 0.00–0.07)
Basophils Absolute: 0.1 10*3/uL (ref 0.0–0.1)
Basophils Relative: 1 %
Eosinophils Absolute: 1.2 10*3/uL — ABNORMAL HIGH (ref 0.0–0.5)
Eosinophils Relative: 9 %
HCT: 38.6 % (ref 36.0–46.0)
Hemoglobin: 12.6 g/dL (ref 12.0–15.0)
Immature Granulocytes: 1 %
Lymphocytes Relative: 22 %
Lymphs Abs: 3 10*3/uL (ref 0.7–4.0)
MCH: 27.2 pg (ref 26.0–34.0)
MCHC: 32.6 g/dL (ref 30.0–36.0)
MCV: 83.4 fL (ref 80.0–100.0)
Monocytes Absolute: 0.7 10*3/uL (ref 0.1–1.0)
Monocytes Relative: 5 %
Neutro Abs: 8.4 10*3/uL — ABNORMAL HIGH (ref 1.7–7.7)
Neutrophils Relative %: 62 %
Platelets: 339 10*3/uL (ref 150–400)
RBC: 4.63 MIL/uL (ref 3.87–5.11)
RDW: 14.3 % (ref 11.5–15.5)
WBC: 13.5 10*3/uL — ABNORMAL HIGH (ref 4.0–10.5)
nRBC: 0 % (ref 0.0–0.2)

## 2020-01-12 LAB — I-STAT BETA HCG BLOOD, ED (MC, WL, AP ONLY): I-stat hCG, quantitative: <5 m[IU]/mL (ref <5)

## 2020-01-12 LAB — COMPREHENSIVE METABOLIC PANEL
ALT: 20 U/L (ref 0–44)
AST: 21 U/L (ref 15–41)
Albumin: 3.9 g/dL (ref 3.5–5.0)
Alkaline Phosphatase: 54 U/L (ref 38–126)
Anion gap: 12 (ref 5–15)
BUN: 16 mg/dL (ref 6–20)
CO2: 22 mmol/L (ref 22–32)
Calcium: 9.1 mg/dL (ref 8.9–10.3)
Chloride: 98 mmol/L (ref 98–111)
Creatinine, Ser: 0.65 mg/dL (ref 0.44–1.00)
GFR, Estimated: >60 mL/min (ref >60)
Glucose, Bld: 200 mg/dL — ABNORMAL HIGH (ref 70–99)
Potassium: 3.8 mmol/L (ref 3.5–5.1)
Sodium: 132 mmol/L — ABNORMAL LOW (ref 135–145)
Total Bilirubin: 0.1 mg/dL — ABNORMAL LOW (ref 0.3–1.2)
Total Protein: 7.9 g/dL (ref 6.5–8.1)

## 2020-01-12 LAB — LIPASE, BLOOD: Lipase: 35 U/L (ref 11–51)

## 2020-01-12 MED ORDER — HYDROCODONE-ACETAMINOPHEN 5-325 MG PO TABS
1.0000 | ORAL_TABLET | Freq: Four times a day (QID) | ORAL | 0 refills | Status: DC | PRN
Start: 2020-01-12 — End: 2020-01-20

## 2020-01-12 MED ORDER — ONDANSETRON HCL 4 MG/2ML IJ SOLN
4.0000 mg | Freq: Once | INTRAMUSCULAR | Status: AC
  Administered 2020-01-12: 4 mg via INTRAVENOUS
  Filled 2020-01-12: qty 2

## 2020-01-12 MED ORDER — MORPHINE SULFATE (PF) 4 MG/ML IV SOLN
4.0000 mg | Freq: Once | INTRAVENOUS | Status: AC
  Administered 2020-01-12: 4 mg via INTRAVENOUS
  Filled 2020-01-12: qty 1

## 2020-01-12 MED ORDER — ONDANSETRON 4 MG PO TBDP: 4.0000 mg | ORAL_TABLET | Freq: Three times a day (TID) | ORAL | 0 refills | Status: DC | PRN

## 2020-01-12 MED ORDER — HYDROCODONE-ACETAMINOPHEN 5-325 MG PO TABS
1.0000 | ORAL_TABLET | Freq: Once | ORAL | Status: AC
  Administered 2020-01-12: 1 via ORAL
  Filled 2020-01-12: qty 1

## 2020-01-12 NOTE — Discharge Instructions (Addendum)
Thank you for allowing me to care for you today in the Emergency Department.   Your symptoms today were consistent with biliary colic.  You should follow-up with general surgery for this.  [facility]'s contact information is listed above.  Take 650 mg of Tylenol or 600 mg of ibuprofen with food every 6 hours for pain.  You can alternate between these 2 medications every 3 hours if your pain returns.  For instance, you can take Tylenol at noon, followed by a dose of ibuprofen at 3, followed by second dose of Tylenol and 6.  For severe, uncontrollable pain, you can take 1 tablet of Norco every 6 hours.  Each tablet of Norco contains 325 mg of Tylenol.  He should not take more than 4000 mg of Tylenol from all sources in a 24-hour period.  Do not work or drive while taking this medication as it can cause you to be impaired.  Let 1 tablet of Zofran dissolve in your tongue every 8 hours as needed for nausea or vomiting.  I have also included instructions on dietary changes that your should try to follow to try and reduce recurrent episodes of pain.  There is also a nodule that was seen on your liver today.  You can follow-up with gastroenterology for this finding.  Return to the emergency department if you have uncontrollable vomiting, uncontrollable pain, if you start having worsening abdominal pain no fevers, or other new, concerning symptoms.

## 2020-01-12 NOTE — ED Triage Notes (Signed)
Patient arrives with complaints of generalized abdominal pain and NV. Declines any urinary symptoms or diarrhea.

## 2020-01-12 NOTE — ED Provider Notes (Signed)
[facility] DEPT Provider Note   CSN: [id number] Arrival date & time: 01/12/20  0300     History Chief Complaint  Patient presents with  . Abdominal Pain    Elizabeth Warren is a 39 y.o. female with a history of diabetes mellitus type 2 and obesity who presents the emergency department with a chief complaint of abdominal pain.  The patient endorses constant, worsening epigastric and right upper quadrant abdominal pain that she characterizes as cramping and sharp that began shortly after eating dinner.  Symptoms were accompanied by nausea and several episodes of nonbloody, nonbilious vomiting.  She denies fever, chills, chest pain, shortness of breath, dysuria, hematuria, flank pain, diarrhea, constipation, vaginal bleeding or discharge.  No treatment prior to arrival.  No history of abdominal surgery.  No known sick contacts.  The history is provided by the patient and medical records. No language interpreter was used.       Past Medical History:  Diagnosis Date  . Gestational diabetes   . History of gestational diabetes 09/06/2016  . Hypertension    pregnancy  . NVD (normal vaginal delivery) 09/20/2010  . Pre-existing type 2 diabetes mellitus during pregnancy in second trimester 10/02/2016    Patient Active Problem List   Diagnosis Date Noted  . Low TSH level 09/03/2019  . Type 2 diabetes mellitus without complication, without long-term current use of insulin ([place]) 09/01/2019  . Obesity (BMI 30-39.9) 10/31/2011    Past Surgical History:  Procedure Laterality Date  . NO PAST SURGERIES       OB History    Gravida  5   Para  5   Term  5   Preterm  0   AB  0   Living  5     SAB  0   TAB  0   Ectopic  0   Multiple  0   Live Births  5           Family History  Problem Relation Age of Onset  . Diabetes Mother   . Hypertension Mother   . Diabetes Father   . Hypertension Father   . Diabetes Maternal Grandmother   .  Hypertension Maternal Grandmother   . Diabetes Maternal Grandfather   . Hypertension Maternal Grandfather   . Diabetes Paternal Grandmother   . Hypertension Paternal Grandmother   . Diabetes Paternal Grandfather   . Hypertension Paternal Grandfather   . Cancer Neg Hx   . Heart disease Neg Hx   . Stroke Neg Hx     Social History   Tobacco Use  . Smoking status: Never Smoker  . Smokeless tobacco: Never Used  Vaping Use  . Vaping Use: Never used  Substance Use Topics  . Alcohol use: No  . Drug use: No    Home Medications Prior to Admission medications   Medication Sig Start Date End Date Taking? Authorizing Provider  Accu-Chek Softclix Lancets lancets Use as instructed 09/03/19   Gifford Shave, MD  Blood Glucose Monitoring Suppl (ACCU-CHEK GUIDE ME) w/Device KIT 1 Units by Does not apply route every other day. 09/03/19   Gifford Shave, MD  cetirizine (ZYRTEC) 10 MG tablet Take 1 tablet (10 mg total) by mouth daily. 09/01/19   Wilber Oliphant, MD  Cholecalciferol (VITAMIN D3) 10 MCG (400 UNIT) tablet Take 2 tablets (800 Units total) by mouth daily. 11/17/18   Loura Halt A, NP  glucose blood (ACCU-CHEK GUIDE) test strip Use as instructed 09/03/19  Gifford Shave, MD  HYDROcodone-acetaminophen (NORCO/VICODIN) 5-325 MG tablet Take 1 tablet by mouth every 6 (six) hours as needed. 01/12/20   Suhani Stillion A, PA-C  metFORMIN (GLUCOPHAGE-XR) 500 MG 24 hr tablet Take 1 tablet (500 mg total) by mouth 2 (two) times daily. Will take 1 tablet with breakfast for 2 weeks and then increase to 1 tablet with breakfast and 1 tablet with dinner after 2 weeks 09/03/19   Gifford Shave, MD  ondansetron (ZOFRAN ODT) 4 MG disintegrating tablet Take 1 tablet (4 mg total) by mouth every 8 (eight) hours as needed. 01/12/20   Jesiah Grismer A, PA-C  Prenat w/o A Vit-FeFum-FePo-FA (FOLIVANE-OB) 130-92.4-1 MG CAPS Take 1 capsule by mouth daily. Between meals 09/21/19   Wilber Oliphant, MD  Prenat-Fe  Poly-Methfol-FA-DHA (VITAFOL ULTRA) 29-0.6-0.4-200 MG CAPS Take 1 capsule by mouth daily before breakfast. 06/23/19   Shelly Bombard, MD  Prenat-FeFum-FePo-FA-DHA w/o A (PROVIDA DHA) 16-16-1.25-110 MG CAPS Take 1 capsule by mouth daily. 09/16/19   Wilber Oliphant, MD  Prenatal 27-1 MG TABS Take 1 tablet by mouth daily. 10/12/19   Wilber Oliphant, MD    Allergies    Patient has no known allergies.  Review of Systems   Review of Systems  Constitutional: Negative for activity change, chills and fever.  HENT: Negative for congestion and sore throat.   Respiratory: Negative for shortness of breath.   Cardiovascular: Negative for chest pain.  Gastrointestinal: Positive for abdominal pain, nausea and vomiting. Negative for anal bleeding, blood in stool, constipation and diarrhea.  Genitourinary: Negative for dysuria, vaginal bleeding and vaginal discharge.  Musculoskeletal: Negative for back pain, myalgias, neck pain and neck stiffness.  Skin: Negative for rash.  Allergic/Immunologic: Negative for immunocompromised state.  Neurological: Negative for seizures, syncope, weakness, numbness and headaches.  Psychiatric/Behavioral: Negative for confusion.    Physical Exam Updated Vital Signs BP (!) 143/93 (BP Location: Left Arm)   Pulse 81   Temp 97.6 F (36.4 C)   Resp 18   SpO2 97%   Physical Exam Vitals and nursing note reviewed.  Constitutional:      General: She is not in acute distress.    Appearance: She is not ill-appearing, toxic-appearing or diaphoretic.  HENT:     Head: Normocephalic.  Eyes:     Conjunctiva/sclera: Conjunctivae normal.  Cardiovascular:     Rate and Rhythm: Normal rate and regular rhythm.     Pulses: Normal pulses.     Heart sounds: Normal heart sounds. No murmur heard.  No friction rub. No gallop.   Pulmonary:     Effort: Pulmonary effort is normal. No respiratory distress.     Breath sounds: No stridor. No wheezing, rhonchi or rales.  Chest:     Chest  wall: No tenderness.  Abdominal:     General: There is no distension.     Palpations: Abdomen is soft. There is no mass.     Tenderness: There is abdominal tenderness. There is guarding. There is no right CVA tenderness, left CVA tenderness or rebound.     Hernia: No hernia is present.     Comments: Tender to palpation in the epigastric and right upper quadrant.  She has a positive Murphy sign.  There is voluntary guarding, but no rebound.  No CVA tenderness bilaterally.  Abdomen is soft and nondistended.  Normoactive bowel sounds.  Musculoskeletal:     Cervical back: Neck supple.     Right lower leg: No edema.  Left lower leg: No edema.  Skin:    General: Skin is warm.     Findings: No rash.  Neurological:     Mental Status: She is alert.  Psychiatric:        Behavior: Behavior normal.     ED Results / Procedures / Treatments   Labs (all labs ordered are listed, but only abnormal results are displayed) Labs Reviewed  CBC WITH DIFFERENTIAL/PLATELET - Abnormal; Notable for the following components:      Result Value   WBC 13.5 (*)    Neutro Abs 8.4 (*)    Eosinophils Absolute 1.2 (*)    Abs Immature Granulocytes 0.08 (*)    All other components within normal limits  COMPREHENSIVE METABOLIC PANEL - Abnormal; Notable for the following components:   Sodium 132 (*)    Glucose, Bld 200 (*)    Total Bilirubin 0.1 (*)    All other components within normal limits  URINALYSIS, ROUTINE W REFLEX MICROSCOPIC - Abnormal; Notable for the following components:   Color, Urine STRAW (*)    Glucose, UA 50 (*)    All other components within normal limits  LIPASE, BLOOD  I-STAT BETA HCG BLOOD, ED ([facility], [facility], [facility] ONLY)    EKG None  Radiology US Abdomen Limited RUQ (LIVER/GB)  Result Date: 01/12/2020 CLINICAL DATA:  Right upper quadrant pain EXAM: ULTRASOUND ABDOMEN LIMITED RIGHT UPPER QUADRANT COMPARISON:  11/19/2009 FINDINGS: Gallbladder: Gallstone at the neck of the gallbladder which  measures 17 mm. The gallbladder is distended without wall thickening or focal tenderness. No pericholecystic edema. Common bile duct: Diameter: 4 mm.  Where visualized, no filling defect. Liver: 11 mm hypoechoic nodule in the ventral right lobe liver, not seen clearly on a 2011 abdominal CT. Liver echogenicity is diffusely increased. No detected cirrhosis. Portal vein is patent on color Doppler imaging with normal direction of blood flow towards the liver. Other: None. IMPRESSION: 1. Cholelithiasis without signs of cholecystitis. 2. 11 mm liver nodule with nonspecific features. Assuming no chronic liver disease or malignancy history, consider ultrasound follow-up in 6 months. If there is chronic liver disease, positive hepatitis serology, or malignancy - would pursue liver MRI with contrast. Electronically Signed   By: Monte Fantasia M.D.   On: 01/12/2020 05:12    Procedures Procedures (including critical care time)  Medications Ordered in ED Medications  morphine 4 MG/ML injection 4 mg (4 mg Intravenous Given 01/12/20 0358)  ondansetron (ZOFRAN) injection 4 mg (4 mg Intravenous Given 01/12/20 0359)  HYDROcodone-acetaminophen (NORCO/VICODIN) 5-325 MG per tablet 1 tablet (1 tablet Oral Given 01/12/20 0537)    ED Course  I have reviewed the triage vital signs and the nursing notes.  Pertinent labs & imaging results that were available during my care of the patient were reviewed by me and considered in my medical decision making (see chart for details).  Clinical Course as of Jan 12 655  Wed Jan 12, 2020  0535 Patient recheck.  Negative Murphy sign on repeat abdominal exam.  She now has no tenderness in the right upper quadrant.  We will give a dose of Norco and fluid challenge the patient and provide her with an outpatient referral to general surgery.   [MM]    Clinical Course User Index [MM] Makaila Windle, Laymond Purser, PA-C   MDM Rules/Calculators/A&P                          39 year old female with  a  history of diabetes mellitus type 2 and obesity who presents to the emergency department with upper abdominal pain, nausea, vomiting, onset tonight after eating dinner.  Vital signs are reassuring.  On exam, she is tender to palpation in the epigastric region right upper quadrant.  She has a positive Murphy sign and voluntary guarding, but no rebound.  No constitutional symptoms.  Labs and imaging have been independently reviewed and interpreted by me.  She has a leukocytosis of 13.5.  There is mild hyponatremia.  No metabolic derangements.  UA with mild glucosuria.  Lipase is normal.  Right upper quadrant ultrasound with an 17 millimeters gallstone at the neck of the gallbladder there is distention of the gallbladder, but no wall thickening or pericholecystic edema.  She had a negative Murphy sign on exam.  There is also noted to be an 11 mm hypoechoic nodule in the ventral right over the liver.   On reevaluation, repeat abdominal exam is much improved.  She no longer has right upper quadrant tenderness.  She was given a dose of Norco and was successfully fluid challenge.  Suspect the patient is having biliary colic and will provide her with a referral to general surgery.  She will also be given a referral to gastroenterology for nodule on the liver.  These follow-up instructions were discussed with the patient and patient is in agreement with the work-up and plan.  She will be discharged with a short course of pain medicine and Zofran.  Doubt cholecystitis, ascending cholangitis, pancreatitis, mesenteric ischemia, bowel obstruction, pyelonephritis close ectopic pregnancy, or appendicitis.  She is hemodynamically stable no acute distress.  Safe for discharge home with outpatient follow-up as indicated.  Final Clinical Impression(s) / ED Diagnoses Final diagnoses:  RUQ abdominal pain  Biliary colic  Nodule on liver    Rx / DC Orders ED Discharge Orders         Ordered     HYDROcodone-acetaminophen (NORCO/VICODIN) 5-325 MG tablet  Every 6 hours PRN        01/12/20 0552    ondansetron (ZOFRAN ODT) 4 MG disintegrating tablet  Every 8 hours PRN        01/12/20 0552           Joline Maxcy A, PA-C 01/12/20 [place], [place], DO 01/12/20 1655

## 2020-01-18 ENCOUNTER — Encounter (HOSPITAL_COMMUNITY): Payer: Self-pay

## 2020-01-18 ENCOUNTER — Emergency Department (HOSPITAL_COMMUNITY): Payer: Medicaid Other

## 2020-01-18 ENCOUNTER — Observation Stay (HOSPITAL_COMMUNITY)
Admission: EM | Admit: 2020-01-18 | Discharge: 2020-01-20 | Disposition: A | Discharge status: Home / Self Care | Payer: Medicaid Other | Attending: General Surgery | Admitting: General Surgery

## 2020-01-18 DIAGNOSIS — Z7984 Long term (current) use of oral hypoglycemic drugs: Secondary | ICD-10-CM | POA: Diagnosis not present

## 2020-01-18 DIAGNOSIS — Z794 Long term (current) use of insulin: Secondary | ICD-10-CM | POA: Diagnosis not present

## 2020-01-18 DIAGNOSIS — Z79899 Other long term (current) drug therapy: Secondary | ICD-10-CM | POA: Insufficient documentation

## 2020-01-18 DIAGNOSIS — K810 Acute cholecystitis: Principal | ICD-10-CM | POA: Insufficient documentation

## 2020-01-18 DIAGNOSIS — K8010 Calculus of gallbladder with chronic cholecystitis without obstruction: Secondary | ICD-10-CM | POA: Diagnosis not present

## 2020-01-18 DIAGNOSIS — I10 Essential (primary) hypertension: Secondary | ICD-10-CM | POA: Insufficient documentation

## 2020-01-18 DIAGNOSIS — K8050 Calculus of bile duct without cholangitis or cholecystitis without obstruction: Secondary | ICD-10-CM | POA: Diagnosis not present

## 2020-01-18 DIAGNOSIS — Z20822 Contact with and (suspected) exposure to covid-19: Secondary | ICD-10-CM | POA: Insufficient documentation

## 2020-01-18 DIAGNOSIS — K760 Fatty (change of) liver, not elsewhere classified: Secondary | ICD-10-CM | POA: Diagnosis not present

## 2020-01-18 DIAGNOSIS — E119 Type 2 diabetes mellitus without complications: Secondary | ICD-10-CM | POA: Diagnosis not present

## 2020-01-18 DIAGNOSIS — R1011 Right upper quadrant pain: Secondary | ICD-10-CM | POA: Diagnosis present

## 2020-01-18 DIAGNOSIS — R109 Unspecified abdominal pain: Secondary | ICD-10-CM | POA: Diagnosis not present

## 2020-01-18 DIAGNOSIS — K8080 Other cholelithiasis without obstruction: Secondary | ICD-10-CM

## 2020-01-18 HISTORY — DX: Acute cholecystitis: K81.0

## 2020-01-18 LAB — RESP PANEL BY RT-PCR (FLU A&B, COVID) ARPGX2
Influenza A by PCR: NEGATIVE
Influenza B by PCR: NEGATIVE
SARS Coronavirus 2 by RT PCR: NEGATIVE

## 2020-01-18 LAB — CBC
HCT: 38.8 % (ref 36.0–46.0)
Hemoglobin: 12.7 g/dL (ref 12.0–15.0)
MCH: 27.3 pg (ref 26.0–34.0)
MCHC: 32.7 g/dL (ref 30.0–36.0)
MCV: 83.3 fL (ref 80.0–100.0)
Platelets: 345 10*3/uL (ref 150–400)
RBC: 4.66 MIL/uL (ref 3.87–5.11)
RDW: 14.4 % (ref 11.5–15.5)
WBC: 10.6 10*3/uL — ABNORMAL HIGH (ref 4.0–10.5)
nRBC: 0 % (ref 0.0–0.2)

## 2020-01-18 LAB — COMPREHENSIVE METABOLIC PANEL
ALT: 23 U/L (ref 0–44)
AST: 20 U/L (ref 15–41)
Albumin: 4.2 g/dL (ref 3.5–5.0)
Alkaline Phosphatase: 61 U/L (ref 38–126)
Anion gap: 8 (ref 5–15)
BUN: 11 mg/dL (ref 6–20)
CO2: 27 mmol/L (ref 22–32)
Calcium: 9.4 mg/dL (ref 8.9–10.3)
Chloride: 99 mmol/L (ref 98–111)
Creatinine, Ser: 0.6 mg/dL (ref 0.44–1.00)
GFR, Estimated: >60 mL/min (ref >60)
Glucose, Bld: 225 mg/dL — ABNORMAL HIGH (ref 70–99)
Potassium: 4 mmol/L (ref 3.5–5.1)
Sodium: 134 mmol/L — ABNORMAL LOW (ref 135–145)
Total Bilirubin: 0.1 mg/dL — ABNORMAL LOW (ref 0.3–1.2)
Total Protein: 8.3 g/dL — ABNORMAL HIGH (ref 6.5–8.1)

## 2020-01-18 LAB — I-STAT BETA HCG BLOOD, ED (MC, WL, AP ONLY): I-stat hCG, quantitative: <5 m[IU]/mL (ref <5)

## 2020-01-18 LAB — LIPASE, BLOOD: Lipase: 39 U/L (ref 11–51)

## 2020-01-18 LAB — GLUCOSE, CAPILLARY: Glucose-Capillary: 147 mg/dL — ABNORMAL HIGH (ref 70–99)

## 2020-01-18 MED ORDER — HYDRALAZINE HCL 20 MG/ML IJ SOLN
10.0000 mg | INTRAMUSCULAR | Status: DC | PRN
Start: 2020-01-18 — End: 2020-01-20

## 2020-01-18 MED ORDER — MORPHINE SULFATE (PF) 2 MG/ML IV SOLN
1.0000 mg | INTRAVENOUS | Status: DC | PRN
  Administered 2020-01-18 – 2020-01-20 (×5): 2 mg via INTRAVENOUS
  Filled 2020-01-18 (×4): qty 1
  Filled 2020-01-18: qty 2
  Filled 2020-01-18 (×2): qty 1

## 2020-01-18 MED ORDER — SODIUM CHLORIDE 0.9 % IV SOLN
2.0000 g | INTRAVENOUS | Status: DC
  Administered 2020-01-18: 2 g via INTRAVENOUS
  Filled 2020-01-18: qty 2

## 2020-01-18 MED ORDER — INSULIN ASPART 100 UNIT/ML ~~LOC~~ SOLN
0.0000 [IU] | Freq: Three times a day (TID) | SUBCUTANEOUS | Status: DC
  Filled 2020-01-18: qty 0.09

## 2020-01-18 MED ORDER — OXYCODONE HCL 5 MG PO TABS
5.0000 mg | ORAL_TABLET | ORAL | Status: DC | PRN
Start: 2020-01-18 — End: 2020-01-20
  Administered 2020-01-19 – 2020-01-20 (×3): 5 mg via ORAL
  Filled 2020-01-18 (×3): qty 1

## 2020-01-18 MED ORDER — MORPHINE SULFATE (PF) 4 MG/ML IV SOLN
4.0000 mg | Freq: Once | INTRAVENOUS | Status: AC
  Administered 2020-01-18: 4 mg via INTRAVENOUS
  Filled 2020-01-18: qty 1

## 2020-01-18 MED ORDER — MELATONIN 3 MG PO TABS
3.0000 mg | ORAL_TABLET | Freq: Every evening | ORAL | Status: DC | PRN
  Administered 2020-01-19: 3 mg via ORAL
  Filled 2020-01-18: qty 1

## 2020-01-18 MED ORDER — METOPROLOL TARTRATE 5 MG/5ML IV SOLN: 5.0000 mg | Freq: Four times a day (QID) | INTRAVENOUS | Status: DC | PRN

## 2020-01-18 MED ORDER — INSULIN ASPART 100 UNIT/ML ~~LOC~~ SOLN
0.0000 [IU] | Freq: Three times a day (TID) | SUBCUTANEOUS | Status: DC
  Administered 2020-01-18 – 2020-01-19 (×2): 1 [IU] via SUBCUTANEOUS
  Filled 2020-01-18: qty 0.09

## 2020-01-18 MED ORDER — ONDANSETRON HCL 4 MG/2ML IJ SOLN
4.0000 mg | Freq: Once | INTRAMUSCULAR | Status: AC
  Administered 2020-01-18: 4 mg via INTRAVENOUS
  Filled 2020-01-18: qty 2

## 2020-01-18 MED ORDER — ACETAMINOPHEN 500 MG PO TABS
1000.0000 mg | ORAL_TABLET | Freq: Four times a day (QID) | ORAL | Status: DC
  Administered 2020-01-19 – 2020-01-20 (×2): 1000 mg via ORAL
  Filled 2020-01-18 (×4): qty 2

## 2020-01-18 MED ORDER — ONDANSETRON 4 MG PO TBDP: 4.0000 mg | ORAL_TABLET | Freq: Four times a day (QID) | ORAL | Status: DC | PRN

## 2020-01-18 MED ORDER — ENOXAPARIN SODIUM 40 MG/0.4ML ~~LOC~~ SOLN
40.0000 mg | Freq: Every day | SUBCUTANEOUS | Status: DC
  Administered 2020-01-18: 40 mg via SUBCUTANEOUS
  Filled 2020-01-18: qty 0.4

## 2020-01-18 MED ORDER — POTASSIUM CHLORIDE IN NACL 20-0.9 MEQ/L-% IV SOLN
INTRAVENOUS | Status: DC
  Filled 2020-01-18 (×2): qty 1000

## 2020-01-18 MED ORDER — ONDANSETRON HCL 4 MG/2ML IJ SOLN: 4.0000 mg | Freq: Four times a day (QID) | INTRAMUSCULAR | Status: DC | PRN

## 2020-01-18 MED ORDER — SIMETHICONE 80 MG PO CHEW
40.0000 mg | CHEWABLE_TABLET | Freq: Four times a day (QID) | ORAL | Status: DC | PRN
  Administered 2020-01-19: 40 mg via ORAL
  Filled 2020-01-18 (×2): qty 1

## 2020-01-18 NOTE — ED Triage Notes (Signed)
Pt presents with c/o abdominal pain. Pt reports that she needs to be seen for possible gall stones per her MD. Pain started yesterday.

## 2020-01-18 NOTE — H&P (Signed)
Elizabeth Warren 13-Mar-1980  [id number].    Chief Complaint/Reason for Consult: cholecystitis  HPI:  This is a 39 yo [place] female who has a history of gestational diabetes who now has some pre-diabetes.  She began having epigastric and RUQ abdominal pain about 6 days ago after eating.  She developed pain along with N/V.  She presented to the ED where she was found to have cholelithiasis and a WBC of 13K but no evidence of cholecystitis.  She was given prn meds for pain and nausea and sent home with [facility]'s number and advised to call to follow up for elective cholecystectomy.  She has now run out of medications and her pain has returned.  No N/V this time, just pain in her upper abdomen.  She denies fevers, chills, CP, SOB, etc.  She represented to the ED today and had a repeat US that revealed some wall thickening concerning for early cholecystitis.  We have been asked to see her for admission.  Her labs are unremarkable.  ROS: ROS: Please see HPI, otherwise all other systems have been reviewed and are negative.  Family History  Problem Relation Age of Onset  . Diabetes Mother   . Hypertension Mother   . Diabetes Father   . Hypertension Father   . Diabetes Maternal Grandmother   . Hypertension Maternal Grandmother   . Diabetes Maternal Grandfather   . Hypertension Maternal Grandfather   . Diabetes Paternal Grandmother   . Hypertension Paternal Grandmother   . Diabetes Paternal Grandfather   . Hypertension Paternal Grandfather   . Cancer Neg Hx   . Heart disease Neg Hx   . Stroke Neg Hx     Past Medical History:  Diagnosis Date  . Gestational diabetes   . History of gestational diabetes 09/06/2016  . Hypertension    pregnancy  . NVD (normal vaginal delivery) 09/20/2010  . Pre-existing type 2 diabetes mellitus during pregnancy in second trimester 10/02/2016    Past Surgical History:  Procedure Laterality Date  . NO PAST SURGERIES      Social History:  reports that she has  never smoked. She has never used smokeless tobacco. She reports that she does not drink alcohol and does not use drugs.  Allergies: No Known Allergies  (Not in a hospital admission)    Physical Exam: Blood pressure (!) 144/113, pulse 94, temperature 98.9 F (37.2 C), temperature source Oral, resp. rate 15, SpO2 99 %. General: pleasant, WD, WN [place] female who is laying in bed in NAD HEENT: head is normocephalic, atraumatic.  Sclera are noninjected.  PERRL.  Ears and nose without any masses or lesions.  Mouth is pink and moist Heart: regular, rate, and rhythm.  Normal s1,s2. No obvious murmurs, gallops, or rubs noted.  Palpable radial and pedal pulses bilaterally Lungs: CTAB, no wheezes, rhonchi, or rales noted.  Respiratory effort nonlabored Abd: soft, mildly tender in RUQ and epigastrium along with some minimal LUQ abdominal pain, ND, +BS, no masses, hernias, or organomegaly MS: all 4 extremities are symmetrical with no cyanosis, clubbing, or edema. Skin: warm and dry with no masses, lesions, or rashes Neuro: Cranial nerves 2-12 grossly intact, sensation is normal throughout Psych: A&Ox3 with an appropriate affect.   Results for orders placed or performed during the hospital encounter of 01/18/20 (from the past 48 hour(s))  Lipase, blood     Status: None   Collection Time: 01/18/20 11:52 AM  Result Value Ref Range   Lipase 39  11 - 51 U/L    Comment: Performed at [facility], [address], [place], [place] [address]  Comprehensive metabolic panel     Status: Abnormal   Collection Time: 01/18/20 11:52 AM  Result Value Ref Range   Sodium 134 (L) 135 - 145 mmol/L   Potassium 4.0 3.5 - 5.1 mmol/L   Chloride 99 98 - 111 mmol/L   CO2 27 22 - 32 mmol/L   Glucose, Bld 225 (H) 70 - 99 mg/dL    Comment: Glucose reference range applies only to samples taken after fasting for at least 8 hours.   BUN 11 6 - 20 mg/dL   Creatinine, Ser 6.21 0.44 - 1.00 mg/dL   Calcium  9.4 8.9 - 30.8 mg/dL   Total Protein 8.3 (H) 6.5 - 8.1 g/dL   Albumin 4.2 3.5 - 5.0 g/dL   AST 20 15 - 41 U/L   ALT 23 0 - 44 U/L   Alkaline Phosphatase 61 38 - 126 U/L   Total Bilirubin 0.1 (L) 0.3 - 1.2 mg/dL   GFR, Estimated >65 >78 mL/min    Comment: (NOTE) Calculated using the CKD-EPI Creatinine Equation (2021)    Anion gap 8 5 - 15    Comment: Performed at [facility], [address], [place], [place] [address]  CBC     Status: Abnormal   Collection Time: 01/18/20 11:52 AM  Result Value Ref Range   WBC 10.6 (H) 4.0 - 10.5 K/uL   RBC 4.66 3.87 - 5.11 MIL/uL   Hemoglobin 12.7 12.0 - 15.0 g/dL   HCT 95.2 84.1 - 32.4 %   MCV 83.3 80.0 - 100.0 fL   MCH 27.3 26.0 - 34.0 pg   MCHC 32.7 30.0 - 36.0 g/dL   RDW 40.1 02.7 - 25.3 %   Platelets 345 150 - 400 K/uL   nRBC 0.0 0.0 - 0.2 %    Comment: Performed at [facility], [address] [place], [place] [address]  I-Stat beta hCG blood, ED     Status: None   Collection Time: 01/18/20 12:04 PM  Result Value Ref Range   I-stat hCG, quantitative <5.0 <5 mIU/mL   Comment 3            Comment:   GEST. AGE      CONC.  (mIU/mL)   <=1 WEEK        5 - 50     2 WEEKS       50 - 500     3 WEEKS       100 - 10,000     4 WEEKS     1,000 - 30,000        FEMALE AND NON-PREGNANT FEMALE:     LESS THAN 5 mIU/mL    US Abdomen Limited RUQ (LIVER/GB)  Result Date: 01/18/2020 CLINICAL DATA:  Abdominal pain. EXAM: ULTRASOUND ABDOMEN LIMITED RIGHT UPPER QUADRANT COMPARISON:  Abdominal ultrasound 01/12/2020. FINDINGS: Gallbladder: Cholecystolithiasis. Mild gallbladder wall thickening (3-4 mm). No sonographic Eulah Pont sign is elicited by the scanning technologist. No appreciable pericholecystic fluid. Common bile duct: Diameter: 3-4 mm, within normal limits. Liver: Increased hepatic parenchymal echogenicity. A known 11 mm nodule within the right hepatic lobe was better appreciated on the recent prior examination of  01/12/2020. Portal vein is patent on color Doppler imaging with normal direction of blood flow towards the liver. IMPRESSION: Cholecystolithiasis with mild gallbladder wall thickening. Acute cholecystitis cannot be excluded. Consider  a nuclear medicine HIDA scan for further evaluation, as clinically warranted. An 11 mm nodule within the right hepatic lobe was better appreciated on the prior exam of 01/12/2020. Please refer to this prior report for further description and recommendations. Hyperechogenicity of the hepatic parenchyma. This is a nonspecific finding, which may be seen in the setting of hepatic steatosis or other chronic hepatic parenchymal disease. Electronically Signed   By: Jackey Loge DO   On: 01/18/2020 15:27      Assessment/Plan DM - SSI ordered  Acute cholecystitis Imaging and chart reviewed.  Given patient's persistent symptoms and findings, it is reasonable to admit the patient for some Rocephin tonight and plan for lap chole +/- IOC tomorrow with possible DC home tomorrow pending timing of surgery and how she does.  She can have CLD tonight and NPO p MN.  This has been discussed with the patient and she understands and agrees with this plan.   FEN - CLD, NPO p MN/IVFs VTE - lovenox ID - Rocephin Admit - obs  Letha Cape, [facility] Surgery 01/18/2020, 4:35 PM Please see Amion for pager number during day hours 7:00am-4:30pm or 7:00am -11:30am on weekends

## 2020-01-18 NOTE — ED Notes (Signed)
Patient given chicken broth, pop sicle, and water

## 2020-01-18 NOTE — ED Provider Notes (Signed)
[facility] DEPT Provider Note   CSN: [id number] Arrival date & time: 01/18/20  1029     History Chief Complaint  Patient presents with  . Abdominal Pain    Elizabeth Warren is a 39 y.o. female past history of gestational diabetes, hypertension who presents for evaluation of upper abdominal pain.  She reports that she was seen here on 01/12/2020 for evaluation of upper abdominal pain.  She reports that she was able to get her pain control and was sent home with pain medication, nausea medication.  She states that she has run out of those medications), she has had return of her pain.  She states that she has not really made any dietary changes but she does state that eating makes her pain worse so she has limited what she is 8.  She has had about 1-2 episodes of vomiting since last night but otherwise has not had any vomiting.  She was referred to general surgery but has not called them yet.  She denies any fevers, chest pain, difficulty breathing.  The history is provided by the patient.       Past Medical History:  Diagnosis Date  . Gestational diabetes   . History of gestational diabetes 09/06/2016  . Hypertension    pregnancy  . NVD (normal vaginal delivery) 09/20/2010  . Pre-existing type 2 diabetes mellitus during pregnancy in second trimester 10/02/2016    Patient Active Problem List   Diagnosis Date Noted  . Acute cholecystitis 01/18/2020  . Low TSH level 09/03/2019  . Type 2 diabetes mellitus without complication, without long-term current use of insulin ([place]) 09/01/2019  . Obesity (BMI 30-39.9) 10/31/2011    Past Surgical History:  Procedure Laterality Date  . NO PAST SURGERIES       OB History    Gravida  5   Para  5   Term  5   Preterm  0   AB  0   Living  5     SAB  0   IAB  0   Ectopic  0   Multiple  0   Live Births  5           Family History  Problem Relation Age of Onset  . Diabetes Mother   .  Hypertension Mother   . Diabetes Father   . Hypertension Father   . Diabetes Maternal Grandmother   . Hypertension Maternal Grandmother   . Diabetes Maternal Grandfather   . Hypertension Maternal Grandfather   . Diabetes Paternal Grandmother   . Hypertension Paternal Grandmother   . Diabetes Paternal Grandfather   . Hypertension Paternal Grandfather   . Cancer Neg Hx   . Heart disease Neg Hx   . Stroke Neg Hx     Social History   Tobacco Use  . Smoking status: Never Smoker  . Smokeless tobacco: Never Used  Vaping Use  . Vaping Use: Never used  Substance Use Topics  . Alcohol use: No  . Drug use: No    Home Medications Prior to Admission medications   Medication Sig Start Date End Date Taking? Authorizing Provider  Accu-Chek Softclix Lancets lancets Use as instructed 09/03/19   Gifford Shave, MD  Blood Glucose Monitoring Suppl (ACCU-CHEK GUIDE ME) w/Device KIT 1 Units by Does not apply route every other day. 09/03/19   Gifford Shave, MD  cetirizine (ZYRTEC) 10 MG tablet Take 1 tablet (10 mg total) by mouth daily. 09/01/19   Zettie Cooley  E, MD  Cholecalciferol (VITAMIN D3) 10 MCG (400 UNIT) tablet Take 2 tablets (800 Units total) by mouth daily. 11/17/18   Loura Halt A, NP  glucose blood (ACCU-CHEK GUIDE) test strip Use as instructed 09/03/19   Gifford Shave, MD  HYDROcodone-acetaminophen (NORCO/VICODIN) 5-325 MG tablet Take 1 tablet by mouth every 6 (six) hours as needed. 01/12/20   McDonald, Mia A, PA-C  metFORMIN (GLUCOPHAGE-XR) 500 MG 24 hr tablet Take 1 tablet (500 mg total) by mouth 2 (two) times daily. Will take 1 tablet with breakfast for 2 weeks and then increase to 1 tablet with breakfast and 1 tablet with dinner after 2 weeks 09/03/19   Gifford Shave, MD  ondansetron (ZOFRAN ODT) 4 MG disintegrating tablet Take 1 tablet (4 mg total) by mouth every 8 (eight) hours as needed. 01/12/20   McDonald, Mia A, PA-C  Prenat w/o A Vit-FeFum-FePo-FA (FOLIVANE-OB) 130-92.4-1  MG CAPS Take 1 capsule by mouth daily. Between meals 09/21/19   Wilber Oliphant, MD  Prenat-Fe Poly-Methfol-FA-DHA (VITAFOL ULTRA) 29-0.6-0.4-200 MG CAPS Take 1 capsule by mouth daily before breakfast. 06/23/19   Shelly Bombard, MD  Prenat-FeFum-FePo-FA-DHA w/o A (PROVIDA DHA) 16-16-1.25-110 MG CAPS Take 1 capsule by mouth daily. 09/16/19   Wilber Oliphant, MD  Prenatal 27-1 MG TABS Take 1 tablet by mouth daily. 10/12/19   Wilber Oliphant, MD    Allergies    Patient has no known allergies.  Review of Systems   Review of Systems  Constitutional: Positive for appetite change. Negative for fever.  Respiratory: Negative for cough and shortness of breath.   Cardiovascular: Negative for chest pain.  Gastrointestinal: Positive for abdominal pain, nausea and vomiting.  Genitourinary: Negative for dysuria and hematuria.  Neurological: Negative for headaches.  All other systems reviewed and are negative.   Physical Exam Updated Vital Signs BP (!) 144/113 (BP Location: Left Arm)   Pulse 94   Temp 98.9 F (37.2 C) (Oral)   Resp 15   SpO2 99%   Physical Exam Vitals and nursing note reviewed.  Constitutional:      Appearance: Normal appearance. She is well-developed and well-nourished.  HENT:     Head: Normocephalic and atraumatic.     Mouth/Throat:     Mouth: Oropharynx is clear and moist and mucous membranes are normal.  Eyes:     General: Lids are normal.     Extraocular Movements: EOM normal.     Conjunctiva/sclera: Conjunctivae normal.     Pupils: Pupils are equal, round, and reactive to light.  Cardiovascular:     Rate and Rhythm: Normal rate and regular rhythm.     Pulses: Normal pulses.     Heart sounds: Normal heart sounds. No murmur heard. No friction rub. No gallop.   Pulmonary:     Effort: Pulmonary effort is normal.     Breath sounds: Normal breath sounds.  Abdominal:     Palpations: Abdomen is soft. Abdomen is not rigid.     Tenderness: There is abdominal tenderness. There  is no guarding.     Comments: Abdomen is soft, non-distended. Tenderness to palpation noted to the upper abdomen. No rigidity, No guarding. No peritoneal signs.  Musculoskeletal:        General: Normal range of motion.     Cervical back: Full passive range of motion without pain.  Skin:    General: Skin is warm and dry.     Capillary Refill: Capillary refill takes less than 2 seconds.  Neurological:  Mental Status: She is alert and oriented to person, place, and time.  Psychiatric:        Mood and Affect: Mood and affect normal.        Speech: Speech normal.     ED Results / Procedures / Treatments   Labs (all labs ordered are listed, but only abnormal results are displayed) Labs Reviewed  COMPREHENSIVE METABOLIC PANEL - Abnormal; Notable for the following components:      Result Value   Sodium 134 (*)    Glucose, Bld 225 (*)    Total Protein 8.3 (*)    Total Bilirubin 0.1 (*)    All other components within normal limits  CBC - Abnormal; Notable for the following components:   WBC 10.6 (*)    All other components within normal limits  RESP PANEL BY RT-PCR (FLU A&B, COVID) ARPGX2  LIPASE, BLOOD  URINALYSIS, ROUTINE W REFLEX MICROSCOPIC  I-STAT BETA HCG BLOOD, ED ([facility], [facility], [facility] ONLY)    EKG None  Radiology US Abdomen Limited RUQ (LIVER/GB)  Result Date: 01/18/2020 CLINICAL DATA:  Abdominal pain. EXAM: ULTRASOUND ABDOMEN LIMITED RIGHT UPPER QUADRANT COMPARISON:  Abdominal ultrasound 01/12/2020. FINDINGS: Gallbladder: Cholecystolithiasis. Mild gallbladder wall thickening (3-4 mm). No sonographic Percell Miller sign is elicited by the scanning technologist. No appreciable pericholecystic fluid. Common bile duct: Diameter: 3-4 mm, within normal limits. Liver: Increased hepatic parenchymal echogenicity. A known 11 mm nodule within the right hepatic lobe was better appreciated on the recent prior examination of 01/12/2020. Portal vein is patent on color Doppler imaging with normal  direction of blood flow towards the liver. IMPRESSION: Cholecystolithiasis with mild gallbladder wall thickening. Acute cholecystitis cannot be excluded. Consider a nuclear medicine HIDA scan for further evaluation, as clinically warranted. An 11 mm nodule within the right hepatic lobe was better appreciated on the prior exam of 01/12/2020. Please refer to this prior report for further description and recommendations. Hyperechogenicity of the hepatic parenchyma. This is a nonspecific finding, which may be seen in the setting of hepatic steatosis or other chronic hepatic parenchymal disease. Electronically Signed   By: Kellie Simmering DO   On: 01/18/2020 15:27    Procedures Procedures (including critical care time)  Medications Ordered in ED Medications  ondansetron (ZOFRAN) injection 4 mg (4 mg Intravenous Given 01/18/20 1534)  morphine 4 MG/ML injection 4 mg (4 mg Intravenous Given 01/18/20 1534)    ED Course  I have reviewed the triage vital signs and the nursing notes.  Pertinent labs & imaging results that were available during my care of the patient were reviewed by me and considered in my medical decision making (see chart for details).    MDM Rules/Calculators/A&P                          39 year old female who presents for evaluation of upper abdominal pain.  She was seen here last week for same symptoms.  States that her pain has returned.  She has had a few episodes of vomiting yesterday.  She has not followed up with outpatient GEN surge.  No fevers.  On initial arrival, she is afebrile, nontoxic-appearing.  Vital signs are stable.  On exam, she has tenderness palpation upper abdomen.  No rigidity, guarding.  No CVA tenderness.  Concern for hepatobiliary etiology.  Plan to check labs.  Ultrasound ordered in triage.  I-stat beta is negative. Lipase is normal. WBC shows slight leukocytosis. CMP shows normal BUN/Cr.   U/S  shows cholelithiasis with mild gallbladder wall thickening. She  has a 63mhepatic nodule noted.   Discussed patient with KClaiborne Billings(Gen Surg PA) who will evaluate patient in the ED.  Discussed with KClaiborne Billingsafter evaluation here in the ED.  GEN surge will plan to admit for cholecystectomy.  Portions of this note were generated with DLobbyist Dictation errors may occur despite best attempts at proofreading.   Final Clinical Impression(s) / ED Diagnoses Final diagnoses:  Abdominal pain  Biliary calculus of other site without obstruction    Rx / DC Orders ED Discharge Orders    None       LVolanda Napoleon PA-C 01/18/20 1656    ZMilton Ferguson MD 01/18/20 2216

## 2020-01-19 ENCOUNTER — Observation Stay (HOSPITAL_COMMUNITY): Payer: Medicaid Other | Admitting: Certified Registered Nurse Anesthetist

## 2020-01-19 ENCOUNTER — Encounter (HOSPITAL_COMMUNITY): Payer: Self-pay

## 2020-01-19 ENCOUNTER — Encounter (HOSPITAL_COMMUNITY)
Admission: EM | Disposition: A | Discharge status: Home / Self Care | Payer: Self-pay | Source: Home / Self Care | Attending: Emergency Medicine

## 2020-01-19 ENCOUNTER — Other Ambulatory Visit: Payer: Self-pay

## 2020-01-19 DIAGNOSIS — I10 Essential (primary) hypertension: Secondary | ICD-10-CM | POA: Diagnosis not present

## 2020-01-19 DIAGNOSIS — K8010 Calculus of gallbladder with chronic cholecystitis without obstruction: Secondary | ICD-10-CM | POA: Diagnosis not present

## 2020-01-19 DIAGNOSIS — Z7984 Long term (current) use of oral hypoglycemic drugs: Secondary | ICD-10-CM | POA: Diagnosis not present

## 2020-01-19 DIAGNOSIS — Z794 Long term (current) use of insulin: Secondary | ICD-10-CM | POA: Diagnosis not present

## 2020-01-19 DIAGNOSIS — E119 Type 2 diabetes mellitus without complications: Secondary | ICD-10-CM | POA: Diagnosis not present

## 2020-01-19 DIAGNOSIS — Z79899 Other long term (current) drug therapy: Secondary | ICD-10-CM | POA: Diagnosis not present

## 2020-01-19 DIAGNOSIS — K810 Acute cholecystitis: Secondary | ICD-10-CM | POA: Diagnosis not present

## 2020-01-19 DIAGNOSIS — K8042 Calculus of bile duct with acute cholecystitis without obstruction: Secondary | ICD-10-CM | POA: Diagnosis not present

## 2020-01-19 DIAGNOSIS — Z20822 Contact with and (suspected) exposure to covid-19: Secondary | ICD-10-CM | POA: Diagnosis not present

## 2020-01-19 HISTORY — PX: CHOLECYSTECTOMY: SHX55

## 2020-01-19 LAB — SURGICAL PCR SCREEN
MRSA, PCR: NEGATIVE
Staphylococcus aureus: POSITIVE — AB

## 2020-01-19 LAB — GLUCOSE, CAPILLARY
Glucose-Capillary: 126 mg/dL — ABNORMAL HIGH (ref 70–99)
Glucose-Capillary: 156 mg/dL — ABNORMAL HIGH (ref 70–99)
Glucose-Capillary: 167 mg/dL — ABNORMAL HIGH (ref 70–99)
Glucose-Capillary: 201 mg/dL — ABNORMAL HIGH (ref 70–99)

## 2020-01-19 SURGERY — LAPAROSCOPIC CHOLECYSTECTOMY
Anesthesia: General | Site: Abdomen

## 2020-01-19 MED ORDER — ONDANSETRON HCL 4 MG/2ML IJ SOLN: 4.0000 mg | Freq: Once | INTRAMUSCULAR | Status: DC | PRN

## 2020-01-19 MED ORDER — LIDOCAINE 2% (20 MG/ML) 5 ML SYRINGE
INTRAMUSCULAR | Status: DC | PRN
  Administered 2020-01-19: 100 mg via INTRAVENOUS

## 2020-01-19 MED ORDER — LACTATED RINGERS IV SOLN: INTRAVENOUS | Status: DC

## 2020-01-19 MED ORDER — POTASSIUM CHLORIDE IN NACL 20-0.9 MEQ/L-% IV SOLN
INTRAVENOUS | Status: DC
  Filled 2020-01-19: qty 1000

## 2020-01-19 MED ORDER — LACTATED RINGERS IV SOLN: INTRAVENOUS | Status: DC | PRN

## 2020-01-19 MED ORDER — ROCURONIUM BROMIDE 10 MG/ML (PF) SYRINGE
PREFILLED_SYRINGE | INTRAVENOUS | Status: AC
  Filled 2020-01-19: qty 10

## 2020-01-19 MED ORDER — ONDANSETRON HCL 4 MG/2ML IJ SOLN
INTRAMUSCULAR | Status: AC
  Filled 2020-01-19: qty 2

## 2020-01-19 MED ORDER — MIDAZOLAM HCL 2 MG/2ML IJ SOLN
INTRAMUSCULAR | Status: AC
  Filled 2020-01-19: qty 2

## 2020-01-19 MED ORDER — LACTATED RINGERS IR SOLN
Status: DC | PRN
  Administered 2020-01-19: 1000 mL

## 2020-01-19 MED ORDER — PROPOFOL 10 MG/ML IV BOLUS
INTRAVENOUS | Status: AC
  Filled 2020-01-19: qty 20

## 2020-01-19 MED ORDER — SUGAMMADEX SODIUM 200 MG/2ML IV SOLN
INTRAVENOUS | Status: DC | PRN
  Administered 2020-01-19: 400 mg via INTRAVENOUS

## 2020-01-19 MED ORDER — ONDANSETRON HCL 4 MG/2ML IJ SOLN
INTRAMUSCULAR | Status: DC | PRN
  Administered 2020-01-19: 4 mg via INTRAVENOUS

## 2020-01-19 MED ORDER — DEXAMETHASONE SODIUM PHOSPHATE 10 MG/ML IJ SOLN
INTRAMUSCULAR | Status: AC
  Filled 2020-01-19: qty 1

## 2020-01-19 MED ORDER — ACETAMINOPHEN 500 MG PO TABS
1000.0000 mg | ORAL_TABLET | Freq: Four times a day (QID) | ORAL | 0 refills | Status: AC | PRN
Start: 2020-01-19 — End: 2020-01-23

## 2020-01-19 MED ORDER — ENOXAPARIN SODIUM 40 MG/0.4ML ~~LOC~~ SOLN
40.0000 mg | Freq: Every day | SUBCUTANEOUS | Status: DC
  Filled 2020-01-19: qty 0.4

## 2020-01-19 MED ORDER — ROCURONIUM BROMIDE 10 MG/ML (PF) SYRINGE
PREFILLED_SYRINGE | INTRAVENOUS | Status: DC | PRN
  Administered 2020-01-19: 10 mg via INTRAVENOUS
  Administered 2020-01-19: 80 mg via INTRAVENOUS

## 2020-01-19 MED ORDER — 0.9 % SODIUM CHLORIDE (POUR BTL) OPTIME
TOPICAL | Status: DC | PRN
  Administered 2020-01-19: 1000 mL

## 2020-01-19 MED ORDER — FENTANYL CITRATE (PF) 250 MCG/5ML IJ SOLN
INTRAMUSCULAR | Status: AC
  Filled 2020-01-19: qty 5

## 2020-01-19 MED ORDER — OXYCODONE HCL 5 MG PO TABS: 5.0000 mg | ORAL_TABLET | ORAL | 0 refills | Status: DC | PRN

## 2020-01-19 MED ORDER — HYDROMORPHONE HCL 1 MG/ML IJ SOLN
INTRAMUSCULAR | Status: AC
  Filled 2020-01-19: qty 2

## 2020-01-19 MED ORDER — DEXAMETHASONE SODIUM PHOSPHATE 10 MG/ML IJ SOLN
INTRAMUSCULAR | Status: DC | PRN
  Administered 2020-01-19: 4 mg via INTRAVENOUS

## 2020-01-19 MED ORDER — LIDOCAINE HCL (PF) 2 % IJ SOLN
INTRAMUSCULAR | Status: AC
  Filled 2020-01-19: qty 5

## 2020-01-19 MED ORDER — IBUPROFEN 200 MG PO TABS: 600.0000 mg | ORAL_TABLET | Freq: Three times a day (TID) | ORAL | 2 refills | Status: DC | PRN

## 2020-01-19 MED ORDER — MUPIROCIN 2 % EX OINT
1.0000 "application " | TOPICAL_OINTMENT | Freq: Two times a day (BID) | CUTANEOUS | Status: DC
  Administered 2020-01-19 – 2020-01-20 (×2): 1 via NASAL
  Filled 2020-01-19: qty 22

## 2020-01-19 MED ORDER — FENTANYL CITRATE (PF) 250 MCG/5ML IJ SOLN
INTRAMUSCULAR | Status: DC | PRN
  Administered 2020-01-19 (×2): 50 ug via INTRAVENOUS
  Administered 2020-01-19: 100 ug via INTRAVENOUS
  Administered 2020-01-19: 50 ug via INTRAVENOUS

## 2020-01-19 MED ORDER — PROPOFOL 10 MG/ML IV BOLUS
INTRAVENOUS | Status: DC | PRN
  Administered 2020-01-19: 120 mg via INTRAVENOUS

## 2020-01-19 MED ORDER — HYDROMORPHONE HCL 1 MG/ML IJ SOLN
0.2500 mg | INTRAMUSCULAR | Status: DC | PRN
Start: 2020-01-19 — End: 2020-01-19
  Administered 2020-01-19 (×4): 0.5 mg via INTRAVENOUS

## 2020-01-19 MED ORDER — SUCCINYLCHOLINE CHLORIDE 200 MG/10ML IV SOSY
PREFILLED_SYRINGE | INTRAVENOUS | Status: AC
  Filled 2020-01-19: qty 10

## 2020-01-19 MED ORDER — LIDOCAINE-EPINEPHRINE (PF) 1 %-1:200000 IJ SOLN
INTRAMUSCULAR | Status: DC | PRN
  Administered 2020-01-19: 16 mL

## 2020-01-19 MED ORDER — MIDAZOLAM HCL 5 MG/5ML IJ SOLN
INTRAMUSCULAR | Status: DC | PRN
  Administered 2020-01-19: 2 mg via INTRAVENOUS

## 2020-01-19 MED ORDER — MEPERIDINE HCL 50 MG/ML IJ SOLN: 6.2500 mg | INTRAMUSCULAR | Status: DC | PRN

## 2020-01-19 SURGICAL SUPPLY — 48 items
ADH SKN CLS APL DERMABOND .7 (GAUZE/BANDAGES/DRESSINGS)
APL PRP STRL LF DISP 70% ISPRP (MISCELLANEOUS) ×2
APPLIER CLIP ROT 10 11.4 M/L (STAPLE) ×3
APR CLP MED LRG 11.4X10 (STAPLE) ×2
BAG SPEC RTRVL 10 TROC 200 (ENDOMECHANICALS) ×2
BAG SPEC RTRVL LRG 6X4 10 (ENDOMECHANICALS) ×2
CABLE HIGH FREQUENCY MONO STRZ (ELECTRODE) ×3 IMPLANT
CHLORAPREP W/TINT 26 (MISCELLANEOUS) ×3 IMPLANT
CLIP APPLIE ROT 10 11.4 M/L (STAPLE) ×2 IMPLANT
CLIP VESOLOCK LG 6/CT PURPLE (CLIP) ×2 IMPLANT
CLIP VESOLOCK MED LG 6/CT (CLIP) IMPLANT
COVER MAYO STAND STRL (DRAPES) IMPLANT
COVER SURGICAL LIGHT HANDLE (MISCELLANEOUS) ×3 IMPLANT
COVER WAND RF STERILE (DRAPES) IMPLANT
DECANTER SPIKE VIAL GLASS SM (MISCELLANEOUS) ×3 IMPLANT
DERMABOND ADVANCED (GAUZE/BANDAGES/DRESSINGS)
DERMABOND ADVANCED .7 DNX12 (GAUZE/BANDAGES/DRESSINGS) IMPLANT
DRAPE C-ARM 42X120 X-RAY (DRAPES) IMPLANT
ELECT L-HOOK LAP 45CM DISP (ELECTROSURGICAL)
ELECT REM PT RETURN 15FT ADLT (MISCELLANEOUS) ×3 IMPLANT
ELECTRODE L-HOOK LAP 45CM DISP (ELECTROSURGICAL) IMPLANT
GLOVE BIO SURGEON STRL SZ 6 (GLOVE) ×3 IMPLANT
GLOVE INDICATOR 6.5 STRL GRN (GLOVE) ×3 IMPLANT
GOWN STRL REUS W/TWL 2XL LVL3 (GOWN DISPOSABLE) ×3 IMPLANT
GOWN STRL REUS W/TWL XL LVL3 (GOWN DISPOSABLE) ×6 IMPLANT
HEMOSTAT SNOW SURGICEL 2X4 (HEMOSTASIS) IMPLANT
KIT BASIN OR (CUSTOM PROCEDURE TRAY) ×3 IMPLANT
KIT TURNOVER KIT A (KITS) IMPLANT
L-HOOK LAP DISP 36CM (ELECTROSURGICAL)
LHOOK LAP DISP 36CM (ELECTROSURGICAL) IMPLANT
PENCIL SMOKE EVACUATOR (MISCELLANEOUS) IMPLANT
POUCH RETRIEVAL ECOSAC 10 (ENDOMECHANICALS) ×2 IMPLANT
POUCH RETRIEVAL ECOSAC 10MM (ENDOMECHANICALS) ×3
POUCH SPECIMEN RETRIEVAL 10MM (ENDOMECHANICALS) ×3 IMPLANT
PROTECTOR NERVE ULNAR (MISCELLANEOUS) IMPLANT
SCISSORS LAP 5X35 DISP (ENDOMECHANICALS) ×3 IMPLANT
SET CHOLANGIOGRAPH MIX (MISCELLANEOUS) IMPLANT
SET IRRIG TUBING LAPAROSCOPIC (IRRIGATION / IRRIGATOR) ×3 IMPLANT
SET TUBE SMOKE EVAC HIGH FLOW (TUBING) IMPLANT
SLEEVE XCEL OPT CAN 5 100 (ENDOMECHANICALS) ×3 IMPLANT
SUT MNCRL AB 4-0 PS2 18 (SUTURE) ×3 IMPLANT
TAPE CLOTH 4X10 WHT NS (GAUZE/BANDAGES/DRESSINGS) IMPLANT
TOWEL OR 17X26 10 PK STRL BLUE (TOWEL DISPOSABLE) ×3 IMPLANT
TOWEL OR NON WOVEN STRL DISP B (DISPOSABLE) ×3 IMPLANT
TRAY LAPAROSCOPIC (CUSTOM PROCEDURE TRAY) ×3 IMPLANT
TROCAR BLADELESS OPT 5 100 (ENDOMECHANICALS) ×3 IMPLANT
TROCAR XCEL BLUNT TIP 100MML (ENDOMECHANICALS) ×3 IMPLANT
TROCAR XCEL NON-BLD 11X100MML (ENDOMECHANICALS) ×3 IMPLANT

## 2020-01-19 NOTE — Progress Notes (Signed)
Consent has been filled out and placed in the chart but has NOT been signed.  Patient is waiting to speak with the doctor again before signing.

## 2020-01-19 NOTE — Op Note (Signed)
Laparoscopic Cholecystectomy  Indications: This patient presents with biliary colic and early acute cholecystitis and will undergo laparoscopic cholecystectomy.  Pre-operative Diagnosis: see above  Post-operative Diagnosis: Same  Surgeon: Almond Lint   Anesthesia: General endotracheal anesthesia and local  ASA Class: 2  Procedure Details  The patient was seen again in the Holding Room. The risks, benefits, complications, treatment options, and expected outcomes were discussed with the patient. The possibilities of  bleeding, recurrent infection, damage to nearby structures, the need for additional procedures, failure to diagnose a condition, the possible need to convert to an open procedure, and creating a complication requiring transfusion or operation were discussed with the patient. The likelihood of improving the patient's symptoms with return to their baseline status is good.    The patient and/or family concurred with the proposed plan, giving informed consent. The site of surgery properly noted. The patient was taken to Operating Room, and the procedure verified as Laparoscopic Cholecystectomy with possible Intraoperative Cholangiogram. A Time Out was held and the above information confirmed.  Prior to the induction of general anesthesia, antibiotic prophylaxis was administered. General endotracheal anesthesia was then administered and tolerated well. After the induction, the abdomen was prepped with Chloraprep and draped in the sterile fashion. The patient was positioned in the supine position.  Local anesthetic agent was injected into the skin near the umbilicus and a 1.5 cm vertical  incision was made with a #11 blade. I dissected down to the abdominal fascia with blunt dissection.  The fascia was incised vertically and we entered the peritoneal cavity bluntly.  A pursestring suture of 0-Vicryl was placed around the fascial opening.  The Hasson cannula was inserted and secured with  the stay suture.  Pneumoperitoneum was then created with CO2 and tolerated well without any adverse changes in the patient's vital signs. An 11-mm port was placed in the subxiphoid position.  Two 5-mm ports were placed in the right upper quadrant. All skin incisions were infiltrated with a local anesthetic agent before making the incision and placing the trocars.   We positioned the patient in reverse Trendelenburg, tilted slightly to the patient's left.  The gallbladder was identified, the fundus grasped and retracted cephalad. Adhesions were lysed bluntly and with the electrocautery where indicated, taking care not to injure any adjacent organs or viscus. The infundibulum was grasped and retracted laterally, exposing the peritoneum overlying the triangle of Calot. The infundibulum was dilated up with stones.  The adjacent peritoneum was dissected away and a small cystic duct was identified.   A critical view of the cystic duct and cystic artery was obtained.  The cystic artery was clipped and divided. The cystic duct was ligated with a clip distally.    The cystic duct was then ligated with clips and divided. The cystic artery was identified, dissected free, ligated with clips and divided as well.   The gallbladder was dissected from the liver bed in retrograde fashion with the electrocautery.  There was a small amount of bile leakage from the gallbladder, but no stones were spilled.  The gallbladder was removed and placed in an Endocatch bag.  The gallbladder and Endocatch bag were then removed through the umbilical port site.  The liver bed was irrigated and inspected. Hemostasis was achieved with the electrocautery. Copious irrigation was utilized and was repeatedly aspirated until clear.    We again inspected the right upper quadrant for hemostasis.  Pneumoperitoneum was released as we removed the trocars.   The pursestring suture was  used to close the umbilical fascia.  4-0 Monocryl was used to close  the skin.   The skin was cleaned and dry, and Dermabond was applied. The patient was then extubated and brought to the recovery room in stable condition. Instrument, sponge, and needle counts were correct at closure and at the conclusion of the case.   Findings: Early acute inflammation.  Stones lodged in infundibulum.    Estimated Blood Loss: min         Drains: none          Specimens: Gallbladder to pathology       Complications: None; patient tolerated the procedure well.         Disposition: PACU - hemodynamically stable.         Condition: stable

## 2020-01-19 NOTE — Progress Notes (Signed)
Day of Surgery  Subjective: Good spirits.  Still with some intermittent upper abdominal discomfort.    ROS: See above, otherwise other systems negative  Objective: Vital signs in last 24 hours: Temp:  [98.1 F (36.7 C)-99.2 F (37.3 C)] 98.1 F (36.7 C) (12/15 0939) Pulse Rate:  [82-108] 97 (12/15 0939) Resp:  [15-18] 16 (12/15 0939) BP: (105-151)/(65-113) 133/93 (12/15 0939) SpO2:  [94 %-100 %] 100 % (12/15 0939) Weight:  [93 kg] 93 kg (12/14 2100) Last BM Date: 01/18/20  Intake/Output from previous day: 12/14 0701 - 12/15 0700 In: 1340 [P.O.:240; I.V.:1100] Out: 800 [Urine:800] Intake/Output this shift: No intake/output data recorded.  PE: Heart: regular Lungs: CTAB Abd: soft, minimally tender in epigastrium and RUQ  Lab Results:  Recent Labs    01/18/20 1152  WBC 10.6*  HGB 12.7  HCT 38.8  PLT 345   BMET Recent Labs    01/18/20 1152  NA 134*  K 4.0  CL 99  CO2 27  GLUCOSE 225*  BUN 11  CREATININE 0.60  CALCIUM 9.4   PT/INR No results for input(s): LABPROT, INR in the last 72 hours. CMP     Component Value Date/Time   NA 134 (L) 01/18/2020 1152   NA 138 09/03/2019 1649   K 4.0 01/18/2020 1152   CL 99 01/18/2020 1152   CO2 27 01/18/2020 1152   GLUCOSE 225 (H) 01/18/2020 1152   BUN 11 01/18/2020 1152   BUN 10 09/03/2019 1649   CREATININE 0.60 01/18/2020 1152   CREATININE 0.60 01/18/2015 1113   CALCIUM 9.4 01/18/2020 1152   PROT 8.3 (H) 01/18/2020 1152   ALBUMIN 4.2 01/18/2020 1152   AST 20 01/18/2020 1152   ALT 23 01/18/2020 1152   ALKPHOS 61 01/18/2020 1152   BILITOT 0.1 (L) 01/18/2020 1152   GFRNONAA >60 01/18/2020 1152   GFRNONAA >89 01/18/2015 1113   GFRAA >60 09/05/2019 0518   GFRAA >89 01/18/2015 1113   Lipase     Component Value Date/Time   LIPASE 39 01/18/2020 1152       Studies/Results: US Abdomen Limited RUQ (LIVER/GB)  Result Date: 01/18/2020 CLINICAL DATA:  Abdominal pain. EXAM: ULTRASOUND ABDOMEN LIMITED  RIGHT UPPER QUADRANT COMPARISON:  Abdominal ultrasound 01/12/2020. FINDINGS: Gallbladder: Cholecystolithiasis. Mild gallbladder wall thickening (3-4 mm). No sonographic Eulah Pont sign is elicited by the scanning technologist. No appreciable pericholecystic fluid. Common bile duct: Diameter: 3-4 mm, within normal limits. Liver: Increased hepatic parenchymal echogenicity. A known 11 mm nodule within the right hepatic lobe was better appreciated on the recent prior examination of 01/12/2020. Portal vein is patent on color Doppler imaging with normal direction of blood flow towards the liver. IMPRESSION: Cholecystolithiasis with mild gallbladder wall thickening. Acute cholecystitis cannot be excluded. Consider a nuclear medicine HIDA scan for further evaluation, as clinically warranted. An 11 mm nodule within the right hepatic lobe was better appreciated on the prior exam of 01/12/2020. Please refer to this prior report for further description and recommendations. Hyperechogenicity of the hepatic parenchyma. This is a nonspecific finding, which may be seen in the setting of hepatic steatosis or other chronic hepatic parenchymal disease. Electronically Signed   By: Jackey Loge DO   On: 01/18/2020 15:27    Anti-infectives: Anti-infectives (From admission, onward)   Start     Dose/Rate Route Frequency Ordered Stop   01/18/20 1900  [MAR Hold]  cefTRIAXone (ROCEPHIN) 2 g in sodium chloride 0.9 % 100 mL IVPB        (  MAR Hold since Wed 01/19/2020 at 0936.Hold Reason: Transfer to a Procedural area.)   2 g 200 mL/hr over 30 Minutes Intravenous Every 24 hours 01/18/20 1854         Assessment/Plan DM - SSI ordered  Acute cholecystitis -all questions answered -ready for OR today -hopeful for DC today post op   FEN - NPO VTE - lovenox ID - Rocephin   LOS: 0 days    Letha Cape , [facility] 01/19/2020, 10:19 AM Please see Amion for pager number during day hours 7:00am-4:30pm or  7:00am -11:30am on weekends

## 2020-01-19 NOTE — Discharge Instructions (Signed)
[facility]  Please arrive at least 30 min before your appointment to complete your check in paperwork.  If you are unable to arrive 30 min prior to your appointment time we may have to cancel or reschedule you. LAPAROSCOPIC SURGERY: POST OP INSTRUCTIONS Always review your discharge instruction sheet given to you by the facility where your surgery was performed. IF YOU HAVE DISABILITY OR FAMILY LEAVE FORMS, YOU MUST BRING THEM TO THE OFFICE FOR PROCESSING.   DO NOT GIVE THEM TO YOUR DOCTOR.  PAIN CONTROL  1. First take acetaminophen (Tylenol) AND/or ibuprofen (Advil) to control your pain after surgery.  Follow directions on package.  Taking acetaminophen (Tylenol) and/or ibuprofen (Advil) regularly after surgery will help to control your pain and lower the amount of prescription pain medication you may need.  You should not take more than 4,000 mg (4 grams) of acetaminophen (Tylenol) in 24 hours.  You should not take ibuprofen (Advil), aleve, motrin, naprosyn or other NSAIDS if you have a history of stomach ulcers or chronic kidney disease.  2. A prescription for pain medication may be given to you upon discharge.  Take your pain medication as prescribed, if you still have uncontrolled pain after taking acetaminophen (Tylenol) or ibuprofen (Advil). 3. Use ice packs to help control pain. 4. If you need a refill on your pain medication, please contact your pharmacy.  They will contact our office to request authorization. Prescriptions will not be filled after 5pm or on week-ends.  HOME MEDICATIONS 5. Take your usually prescribed medications unless otherwise directed.  DIET 6. You should follow a light diet the first few days after arrival home.  Be sure to include lots of fluids daily. Avoid fatty, fried foods.   CONSTIPATION 7. It is common to experience some constipation after surgery and if you are taking pain medication.  Increasing fluid intake and taking a stool  softener (such as Colace) will usually help or prevent this problem from occurring.  A mild laxative (Milk of Magnesia or Miralax) should be taken according to package instructions if there are no bowel movements after 48 hours.  WOUND/INCISION CARE 8. Most patients will experience some swelling and bruising in the area of the incisions.  Ice packs will help.  Swelling and bruising can take several days to resolve.  9. Unless discharge instructions indicate otherwise, follow guidelines below  a. STERI-STRIPS - you may remove your outer bandages 48 hours after surgery, and you may shower at that time.  You have steri-strips (small skin tapes) in place directly over the incision.  These strips should be left on the skin for 7-10 days.   b. DERMABOND/SKIN GLUE - you may shower in 24 hours.  The glue will flake off over the next 2-3 weeks. 10. Any sutures or staples will be removed at the office during your follow-up visit.  ACTIVITIES 11. You may resume regular (light) daily activities beginning the next day--such as daily self-care, walking, climbing stairs--gradually increasing activities as tolerated.  You may have sexual intercourse when it is comfortable.  Refrain from any heavy lifting or straining until approved by your doctor. a. You may drive when you are no longer taking prescription pain medication, you can comfortably wear a seatbelt, and you can safely maneuver your car and apply brakes.  FOLLOW-UP 12. You should see your doctor in the office for a follow-up appointment approximately 2-3 weeks after your surgery.  You should have been given your post-op/follow-up appointment when   your surgery was scheduled.  If you did not receive a post-op/follow-up appointment, make sure that you call for this appointment within a day or two after you arrive home to insure a convenient appointment time.   WHEN TO CALL YOUR DOCTOR: 1. Fever over 101.0 2. Inability to urinate 3. Continued bleeding from  incision. 4. Increased pain, redness, or drainage from the incision. 5. Increasing abdominal pain  The clinic staff is available to answer your questions during regular business hours.  Please don't hesitate to call and ask to speak to one of the nurses for clinical concerns.  If you have a medical emergency, go to the nearest emergency room or call 911.  A surgeon from [facility] is always on call at the hospital. [address], [address], [place], [place]  [address] ? [address] [address], [place], [place]   [address] [phone] ? [phone] ? FAX [phone]  .........   Managing Your Pain After Surgery Without Opioids    Thank you for participating in our program to help patients manage their pain after surgery without opioids. This is part of our effort to provide you with the best care possible, without exposing you or your family to the risk that opioids pose.  What pain can I expect after surgery? You can expect to have some pain after surgery. This is normal. The pain is typically worse the day after surgery, and quickly begins to get better. Many studies have found that many patients are able to manage their pain after surgery with Over-the-Counter (OTC) medications such as Tylenol and Motrin. If you have a condition that does not allow you to take Tylenol or Motrin, notify your surgical team.  How will I manage my pain? The best strategy for controlling your pain after surgery is around the clock pain control with Tylenol (acetaminophen) and Motrin (ibuprofen or Advil). Alternating these medications with each other allows you to maximize your pain control. In addition to Tylenol and Motrin, you can use heating pads or ice packs on your incisions to help reduce your pain.  How will I alternate your regular strength over-the-counter pain medication? You will take a dose of pain medication every three hours. ; Start by taking 650 mg of Tylenol (2 pills of 325  mg) ; 3 hours later take 600 mg of Motrin (3 pills of 200 mg) ; 3 hours after taking the Motrin take 650 mg of Tylenol ; 3 hours after that take 600 mg of Motrin.   - 1 -  See example - if your first dose of Tylenol is at 12:00 PM   12:00 PM Tylenol 650 mg (2 pills of 325 mg)  3:00 PM Motrin 600 mg (3 pills of 200 mg)  6:00 PM Tylenol 650 mg (2 pills of 325 mg)  9:00 PM Motrin 600 mg (3 pills of 200 mg)  Continue alternating every 3 hours   We recommend that you follow this schedule around-the-clock for at least 3 days after surgery, or until you feel that it is no longer needed. Use the table on the last page of this handout to keep track of the medications you are taking. Important: Do not take more than 3000mg of Tylenol or 3200mg of Motrin in a 24-hour period. Do not take ibuprofen/Motrin if you have a history of bleeding stomach ulcers, severe kidney disease, &/or actively taking a blood thinner  What if I still have pain? If you have pain that is not   controlled with the over-the-counter pain medications (Tylenol and Motrin or Advil) you might have what we call "breakthrough" pain. You will receive a prescription for a small amount of an opioid pain medication such as Oxycodone, Tramadol, or Tylenol with Codeine. Use these opioid pills in the first 24 hours after surgery if you have breakthrough pain. Do not take more than 1 pill every 4-6 hours.  If you still have uncontrolled pain after using all opioid pills, don't hesitate to call our staff using the number provided. We will help make sure you are managing your pain in the best way possible, and if necessary, we can provide a prescription for additional pain medication.   Day 1    Time  Name of Medication Number of pills taken  Amount of Acetaminophen  Pain Level   Comments  AM PM       AM PM       AM PM       AM PM       AM PM       AM PM       AM PM       AM PM       Total Daily amount of Acetaminophen Do not  take more than  3,000 mg per day      Day 2    Time  Name of Medication Number of pills taken  Amount of Acetaminophen  Pain Level   Comments  AM PM       AM PM       AM PM       AM PM       AM PM       AM PM       AM PM       AM PM       Total Daily amount of Acetaminophen Do not take more than  3,000 mg per day      Day 3    Time  Name of Medication Number of pills taken  Amount of Acetaminophen  Pain Level   Comments  AM PM       AM PM       AM PM       AM PM          AM PM       AM PM       AM PM       AM PM       Total Daily amount of Acetaminophen Do not take more than  3,000 mg per day      Day 4    Time  Name of Medication Number of pills taken  Amount of Acetaminophen  Pain Level   Comments  AM PM       AM PM       AM PM       AM PM       AM PM       AM PM       AM PM       AM PM       Total Daily amount of Acetaminophen Do not take more than  3,000 mg per day      Day 5    Time  Name of Medication Number of pills taken  Amount of Acetaminophen  Pain Level   Comments  AM PM       AM PM       AM   PM       AM PM       AM PM       AM PM       AM PM       AM PM       Total Daily amount of Acetaminophen Do not take more than  3,000 mg per day       Day 6    Time  Name of Medication Number of pills taken  Amount of Acetaminophen  Pain Level  Comments  AM PM       AM PM       AM PM       AM PM       AM PM       AM PM       AM PM       AM PM       Total Daily amount of Acetaminophen Do not take more than  3,000 mg per day      Day 7    Time  Name of Medication Number of pills taken  Amount of Acetaminophen  Pain Level   Comments  AM PM       AM PM       AM PM       AM PM       AM PM       AM PM       AM PM       AM PM       Total Daily amount of Acetaminophen Do not take more than  3,000 mg per day        For additional information about how and where to safely dispose of unused  opioid medications - https://www.morepowerfulnc.org  Disclaimer: This document contains information and/or instructional materials adapted from [facility] for the typical patient with your condition. It does not replace medical advice from your health care provider because your experience may differ from that of the typical patient. Talk to your health care provider if you have any questions about this document, your condition or your treatment plan. Adapted from [facility]   

## 2020-01-19 NOTE — Anesthesia Preprocedure Evaluation (Signed)
Anesthesia Evaluation  Patient identified by MRN, date of birth, ID band Patient awake    Reviewed: Allergy & Precautions, NPO status , Patient's Chart, lab work & pertinent test results  Airway Mallampati: I  TM Distance: >3 FB Neck ROM: Full    Dental   Pulmonary    Pulmonary exam normal        Cardiovascular hypertension, Pt. on medications Normal cardiovascular exam     Neuro/Psych    GI/Hepatic   Endo/Other  diabetes, Type 2, Insulin Dependent  Renal/GU      Musculoskeletal   Abdominal   Peds  Hematology   Anesthesia Other Findings   Reproductive/Obstetrics                             Anesthesia Physical Anesthesia Plan  ASA: II  Anesthesia Plan: General   Post-op Pain Management:    Induction: Intravenous  PONV Risk Score and Plan: 3 and Ondansetron, Midazolam and Treatment may vary due to age or medical condition  Airway Management Planned: Oral ETT  Additional Equipment:   Intra-op Plan:   Post-operative Plan: Extubation in OR  Informed Consent: I have reviewed the patients History and Physical, chart, labs and discussed the procedure including the risks, benefits and alternatives for the proposed anesthesia with the patient or authorized representative who has indicated his/her understanding and acceptance.       Plan Discussed with: CRNA and Surgeon  Anesthesia Plan Comments:         Anesthesia Quick Evaluation

## 2020-01-19 NOTE — Anesthesia Procedure Notes (Signed)
Procedure Name: Intubation Date/Time: 01/19/2020 11:15 AM Performed by: Mitzie Na, CRNA Pre-anesthesia Checklist: Patient identified, Emergency Drugs available, Suction available and Patient being monitored Patient Re-evaluated:Patient Re-evaluated prior to induction Oxygen Delivery Method: Circle system utilized Preoxygenation: Pre-oxygenation with 100% oxygen Induction Type: IV induction Ventilation: Mask ventilation without difficulty Laryngoscope Size: Mac and 3 Grade View: Grade I Tube type: Oral Tube size: 7.0 mm Number of attempts: 1 Airway Equipment and Method: Stylet Placement Confirmation: ETT inserted through vocal cords under direct vision,  positive ETCO2 and breath sounds checked- equal and bilateral Secured at: 23 cm Tube secured with: Tape Dental Injury: Teeth and Oropharynx as per pre-operative assessment

## 2020-01-19 NOTE — Transfer of Care (Signed)
Immediate Anesthesia Transfer of Care Note  Patient: Elizabeth Warren  Procedure(s) Performed: LAPAROSCOPIC CHOLECYSTECTOMY (N/A Abdomen)  Patient Location: PACU  Anesthesia Type:General  Level of Consciousness: awake, alert , oriented and patient cooperative  Airway & Oxygen Therapy: Patient Spontanous Breathing and Patient connected to face mask oxygen  Post-op Assessment: Report given to RN, Post -op Vital signs reviewed and stable and Patient moving all extremities  Post vital signs: Reviewed and stable  Last Vitals:  Vitals Value Taken Time  BP 130/100 01/19/20 1245  Temp    Pulse 100 01/19/20 1245  Resp 19 01/19/20 1245  SpO2 100 % 01/19/20 1245  Vitals shown include unvalidated device data.  Last Pain:  Vitals:   01/19/20 0939  TempSrc: Oral  PainSc: 5          Complications: No complications documented.

## 2020-01-20 ENCOUNTER — Encounter (HOSPITAL_COMMUNITY): Payer: Self-pay | Admitting: General Surgery

## 2020-01-20 LAB — GLUCOSE, CAPILLARY: Glucose-Capillary: 119 mg/dL — ABNORMAL HIGH (ref 70–99)

## 2020-01-20 LAB — SURGICAL PATHOLOGY

## 2020-01-20 NOTE — Progress Notes (Signed)
Patient was given discharge instructions, and all questions were answered.  Patient was taken to main exit by wheelchair. 

## 2020-01-20 NOTE — Anesthesia Postprocedure Evaluation (Signed)
Anesthesia Post Note  Patient: Elizabeth Warren  Procedure(s) Performed: LAPAROSCOPIC CHOLECYSTECTOMY (N/A Abdomen)     Patient location during evaluation: PACU Anesthesia Type: General Level of consciousness: awake and alert Pain management: pain level controlled Vital Signs Assessment: post-procedure vital signs reviewed and stable Respiratory status: spontaneous breathing, nonlabored ventilation, respiratory function stable and patient connected to nasal cannula oxygen Cardiovascular status: blood pressure returned to baseline and stable Postop Assessment: no apparent nausea or vomiting Anesthetic complications: no   No complications documented.  Last Vitals:  Vitals:   01/19/20 2125 01/20/20 0202  BP: 115/89 113/75  Pulse: (!) 106 84  Resp: 18 16  Temp: 37.3 C 36.8 C  SpO2: 100% 99%    Last Pain:  Vitals:   01/20/20 0516  TempSrc:   PainSc: Asleep                 Sharunda Salmon DAVID

## 2020-01-20 NOTE — Discharge Summary (Signed)
Patient ID: VERONA HARTSHORN [id number] 08/31/1980 39 y.o.  Admit date: 01/18/2020 Discharge date: 01/20/2020  Admitting Diagnosis: cholecystitis  Discharge Diagnosis Patient Active Problem List   Diagnosis Date Noted  . Acute cholecystitis 01/18/2020  . Low TSH level 09/03/2019  . Type 2 diabetes mellitus without complication, without long-term current use of insulin ([place]) 09/01/2019  . Obesity (BMI 30-39.9) 10/31/2011  s/p lap chole  Consultants none  Reason for Admission: This is a 39 yo [place] female who has a history of gestational diabetes who now has some pre-diabetes.  She began having epigastric and RUQ abdominal pain about 6 days ago after eating.  She developed pain along with N/V.  She presented to the ED where she was found to have cholelithiasis and a WBC of 13K but no evidence of cholecystitis.  She was given prn meds for pain and nausea and sent home with [facility]'s number and advised to call to follow up for elective cholecystectomy.  She has now run out of medications and her pain has returned.  No N/V this time, just pain in her upper abdomen.  She denies fevers, chills, CP, SOB, etc.  She represented to the ED today and had a repeat US that revealed some wall thickening concerning for early cholecystitis.  We have been asked to see her for admission.  Her labs are unremarkable.  Procedures Lap chole by Dr. Barry Dienes 12/15  Hospital Course:  The patient was admitted and underwent a laparoscopic cholecystectomy.  The patient tolerated the procedure well.  On POD 1, the patient was tolerating a regular diet, voiding well, mobilizing, and pain was controlled with oral pain medications.  The patient was stable for DC home at this time with appropriate follow up made.   Physical Exam: Abd: soft, appropriately tender, +BS, ND, incisions c/d/i  Allergies as of 01/20/2020   No Known Allergies     Medication List    STOP taking these medications    HYDROcodone-acetaminophen 5-325 MG tablet Commonly known as: NORCO/VICODIN     TAKE these medications   Accu-Chek Guide Me w/Device Kit 1 Units by Does not apply route every other day.   Accu-Chek Guide test strip Generic drug: glucose blood Use as instructed   Accu-Chek Softclix Lancets lancets Use as instructed   acetaminophen 500 MG tablet Commonly known as: TYLENOL Take 2 tablets (1,000 mg total) by mouth every 6 (six) hours as needed for up to 4 days.   ibuprofen 200 MG tablet Commonly known as: Motrin IB Take 3 tablets (600 mg total) by mouth every 8 (eight) hours as needed.   metFORMIN 500 MG 24 hr tablet Commonly known as: GLUCOPHAGE-XR Take 1 tablet (500 mg total) by mouth 2 (two) times daily. Will take 1 tablet with breakfast for 2 weeks and then increase to 1 tablet with breakfast and 1 tablet with dinner after 2 weeks   ondansetron 4 MG disintegrating tablet Commonly known as: Zofran ODT Take 1 tablet (4 mg total) by mouth every 8 (eight) hours as needed. What changed: reasons to take this   oxyCODONE 5 MG immediate release tablet Commonly known as: Oxy IR/ROXICODONE Take 1 tablet (5 mg total) by mouth every 4 (four) hours as needed for moderate pain.   Prenatal 27-1 MG Tabs Take 1 tablet by mouth daily.         Follow-up Information    [facility] Follow up on 02/08/2020.   Specialty: General Surgery Why: arrive at 10:00am  for a 10:30 am appointment time.  please bring photo ID and insurance card wtih you  Contact information: [address] [place] [address] [phone]               Signed: Saverio Danker, [facility] 01/20/2020, 10:27 AM Please see Amion for pager number during day hours 7:00am-4:30pm, 7-11:30am on Weekends

## 2020-02-14 ENCOUNTER — Ambulatory Visit (HOSPITAL_COMMUNITY)
Admission: EM | Admit: 2020-02-14 | Discharge: 2020-02-14 | Disposition: A | Discharge status: Home / Self Care | Payer: Medicaid Other | Attending: Emergency Medicine | Admitting: Emergency Medicine

## 2020-02-14 ENCOUNTER — Other Ambulatory Visit: Payer: Self-pay

## 2020-02-14 ENCOUNTER — Encounter (HOSPITAL_COMMUNITY): Payer: Self-pay

## 2020-02-14 ENCOUNTER — Other Ambulatory Visit: Payer: Self-pay | Admitting: *Deleted

## 2020-02-14 DIAGNOSIS — Z9049 Acquired absence of other specified parts of digestive tract: Secondary | ICD-10-CM | POA: Diagnosis not present

## 2020-02-14 DIAGNOSIS — R197 Diarrhea, unspecified: Secondary | ICD-10-CM

## 2020-02-14 MED ORDER — CHOLESTYRAMINE 4 G PO PACK: 4.0000 g | PACK | Freq: Two times a day (BID) | ORAL | 0 refills | Status: DC

## 2020-02-14 MED ORDER — PRENATAL 27-1 MG PO TABS: 1.0000 | ORAL_TABLET | Freq: Every day | ORAL | 11 refills | Status: DC

## 2020-02-14 NOTE — ED Triage Notes (Signed)
Patient states she has had diarrhea since her abdominal surgery approximately 3 weeks ago. Pt states it has worsened over the last 3 days. Pt is aox4 and ambulatory.

## 2020-02-14 NOTE — Discharge Instructions (Signed)
Take the cholestyramine as directed.    Call your primary care provider or surgeon tomorrow for additional evaluation of your diarrhea.

## 2020-02-14 NOTE — ED Provider Notes (Signed)
[facility]    CSN: [id number] Arrival date & time: 02/14/20  1510      History   Chief Complaint Chief Complaint  Patient presents with  . Diarrhea    Since surgery x 3 weeks    HPI Elizabeth Warren is a 40 y.o. female.   Patient presents with diarrhea x3 weeks since having cholecystectomy.  She states the diarrhea is worse for the last 3 days; >10 episodes/day; occurs after eating or drinking anything; she wakes up at night due to the diarrhea.  She also reports abdominal cramping with each episode of diarrhea.  Treatment attempted at home with Imodium without relief.  She denies fever, chills, emesis, or other symptoms.  She states she has an appointment scheduled next week with her surgeon.  Her medical history includes hypertension, diabetes, low TSH level, obesity.  Laparoscopic cholecystectomy on 01/19/2020.   The history is provided by the patient and medical records.    Past Medical History:  Diagnosis Date  . Gestational diabetes   . History of gestational diabetes 09/06/2016  . Hypertension    pregnancy  . NVD (normal vaginal delivery) 09/20/2010  . Pre-existing type 2 diabetes mellitus during pregnancy in second trimester 10/02/2016    Patient Active Problem List   Diagnosis Date Noted  . Acute cholecystitis 01/18/2020  . Low TSH level 09/03/2019  . Type 2 diabetes mellitus without complication, without long-term current use of insulin ([place]) 09/01/2019  . Obesity (BMI 30-39.9) 10/31/2011    Past Surgical History:  Procedure Laterality Date  . CHOLECYSTECTOMY N/A 01/19/2020   Procedure: LAPAROSCOPIC CHOLECYSTECTOMY;  Surgeon: Stark Klein, MD;  Location: [facility];  Service: General;  Laterality: N/A;  . NO PAST SURGERIES      OB History    Gravida  5   Para  5   Term  5   Preterm  0   AB  0   Living  5     SAB  0   IAB  0   Ectopic  0   Multiple  0   Live Births  5            Home Medications    Prior to Admission  medications   Medication Sig Start Date End Date Taking? Authorizing Provider  Accu-Chek Softclix Lancets lancets Use as instructed 09/03/19  Yes Gifford Shave, MD  Blood Glucose Monitoring Suppl (ACCU-CHEK GUIDE ME) w/Device KIT 1 Units by Does not apply route every other day. 09/03/19  Yes Gifford Shave, MD  cholestyramine Lucrezia Starch) 4 g packet Take 1 packet (4 g total) by mouth 2 (two) times daily with a meal. 02/14/20  Yes Sharion Balloon, NP  glucose blood (ACCU-CHEK GUIDE) test strip Use as instructed 09/03/19  Yes Gifford Shave, MD  metFORMIN (GLUCOPHAGE-XR) 500 MG 24 hr tablet Take 1 tablet (500 mg total) by mouth 2 (two) times daily. Will take 1 tablet with breakfast for 2 weeks and then increase to 1 tablet with breakfast and 1 tablet with dinner after 2 weeks 09/03/19  Yes Gifford Shave, MD  Prenatal 27-1 MG TABS Take 1 tablet by mouth daily. 02/14/20  Yes Wilber Oliphant, MD  ibuprofen (MOTRIN IB) 200 MG tablet Take 3 tablets (600 mg total) by mouth every 8 (eight) hours as needed. 01/19/20 01/18/21  Saverio Danker, PA-C  ondansetron (ZOFRAN ODT) 4 MG disintegrating tablet Take 1 tablet (4 mg total) by mouth every 8 (eight) hours as needed. Patient taking differently:  Take 4 mg by mouth every 8 (eight) hours as needed for nausea or vomiting. 01/12/20   McDonald, Mia A, PA-C  oxyCODONE (OXY IR/ROXICODONE) 5 MG immediate release tablet Take 1 tablet (5 mg total) by mouth every 4 (four) hours as needed for moderate pain. 01/19/20   Saverio Danker, PA-C    Family History Family History  Problem Relation Age of Onset  . Diabetes Mother   . Hypertension Mother   . Diabetes Father   . Hypertension Father   . Diabetes Maternal Grandmother   . Hypertension Maternal Grandmother   . Diabetes Maternal Grandfather   . Hypertension Maternal Grandfather   . Diabetes Paternal Grandmother   . Hypertension Paternal Grandmother   . Diabetes Paternal Grandfather   . Hypertension Paternal  Grandfather   . Cancer Neg Hx   . Heart disease Neg Hx   . Stroke Neg Hx     Social History Social History   Tobacco Use  . Smoking status: Never Smoker  . Smokeless tobacco: Never Used  Vaping Use  . Vaping Use: Never used  Substance Use Topics  . Alcohol use: No  . Drug use: No     Allergies   Patient has no known allergies.   Review of Systems Review of Systems  Constitutional: Negative for chills and fever.  HENT: Negative for ear pain and sore throat.   Eyes: Negative for pain and visual disturbance.  Respiratory: Negative for cough and shortness of breath.   Cardiovascular: Negative for chest pain and palpitations.  Gastrointestinal: Positive for abdominal pain and diarrhea. Negative for vomiting.  Genitourinary: Negative for dysuria and hematuria.  Musculoskeletal: Negative for arthralgias and back pain.  Skin: Negative for color change and rash.  Neurological: Negative for seizures and syncope.  All other systems reviewed and are negative.    Physical Exam Triage Vital Signs ED Triage Vitals  Enc Vitals Group     BP 02/14/20 1554 (!) 142/93     Pulse Rate 02/14/20 1554 (!) 113     Resp 02/14/20 1554 18     Temp 02/14/20 1554 97.7 F (36.5 C)     Temp Source 02/14/20 1554 Oral     SpO2 02/14/20 1554 100 %     Weight --      Height --      Head Circumference --      Peak Flow --      Pain Score 02/14/20 1557 0     Pain Loc --      Pain Edu? --      Excl. in [place]? --    No data found.  Updated Vital Signs BP (!) 142/93 (BP Location: Right Arm)   Pulse (!) 113   Temp 97.7 F (36.5 C) (Oral)   Resp 18   SpO2 100%   Visual Acuity Right Eye Distance:   Left Eye Distance:   Bilateral Distance:    Right Eye Near:   Left Eye Near:    Bilateral Near:     Physical Exam Vitals and nursing note reviewed.  Constitutional:      General: She is not in acute distress.    Appearance: She is well-developed and well-nourished. She is obese. She is  not ill-appearing.  HENT:     Head: Normocephalic and atraumatic.     Mouth/Throat:     Mouth: Mucous membranes are moist.  Eyes:     Conjunctiva/sclera: Conjunctivae normal.  Cardiovascular:     Rate and  Rhythm: Regular rhythm. Tachycardia present.     Heart sounds: Normal heart sounds.  Pulmonary:     Effort: Pulmonary effort is normal. No respiratory distress.     Breath sounds: Normal breath sounds.  Abdominal:     General: Bowel sounds are normal.     Palpations: Abdomen is soft.     Tenderness: There is no abdominal tenderness. There is no guarding or rebound.  Musculoskeletal:        General: No edema.     Cervical back: Neck supple.  Skin:    General: Skin is warm and dry.     Comments: Surgical wounds healing well.   Neurological:     General: No focal deficit present.     Mental Status: She is alert and oriented to person, place, and time.  Psychiatric:        Mood and Affect: Mood and affect and mood normal.        Behavior: Behavior normal.      UC Treatments / Results  Labs (all labs ordered are listed, but only abnormal results are displayed) Labs Reviewed - No data to display  EKG   Radiology No results found.  Procedures Procedures (including critical care time)  Medications Ordered in UC Medications - No data to display  Initial Impression / Assessment and Plan / UC Course  I have reviewed the triage vital signs and the nursing notes.  Pertinent labs & imaging results that were available during my care of the patient were reviewed by me and considered in my medical decision making (see chart for details).   Diarrhea s/p cholecystectomy.  Discussed with Dr. Meda Coffee. Treating with cholestyramine.  Instructed patient to call her PCP or surgeon tomorrow to schedule appointment for additional evaluation of her diarrhea.  Patient agrees to plan of care.   Final Clinical Impressions(s) / UC Diagnoses   Final diagnoses:  Diarrhea, unspecified type   Hx of cholecystectomy     Discharge Instructions     Take the cholestyramine as directed.    Call your primary care provider or surgeon tomorrow for additional evaluation of your diarrhea.        ED Prescriptions    Medication Sig Dispense Auth. Provider   cholestyramine (QUESTRAN) 4 g packet Take 1 packet (4 g total) by mouth 2 (two) times daily with a meal. 20 each Sharion Balloon, NP     PDMP not reviewed this encounter.   Sharion Balloon, NP 02/14/20 [phone]

## 2020-02-23 ENCOUNTER — Ambulatory Visit: Payer: Medicaid Other | Admitting: Family Medicine

## 2020-02-24 ENCOUNTER — Encounter: Payer: Self-pay | Admitting: Family Medicine

## 2020-02-24 ENCOUNTER — Ambulatory Visit: Payer: Medicaid Other | Admitting: Family Medicine

## 2020-02-24 ENCOUNTER — Other Ambulatory Visit: Payer: Self-pay

## 2020-02-24 VITALS — BP 126/84 | HR 100 | Wt 204.0 lb

## 2020-02-24 DIAGNOSIS — E785 Hyperlipidemia, unspecified: Secondary | ICD-10-CM | POA: Diagnosis not present

## 2020-02-24 DIAGNOSIS — R7989 Other specified abnormal findings of blood chemistry: Secondary | ICD-10-CM | POA: Diagnosis not present

## 2020-02-24 DIAGNOSIS — E119 Type 2 diabetes mellitus without complications: Secondary | ICD-10-CM

## 2020-02-24 DIAGNOSIS — K529 Noninfective gastroenteritis and colitis, unspecified: Secondary | ICD-10-CM | POA: Diagnosis not present

## 2020-02-24 DIAGNOSIS — E1169 Type 2 diabetes mellitus with other specified complication: Secondary | ICD-10-CM

## 2020-02-24 NOTE — Patient Instructions (Signed)
It was nice to meet you today,   I would like you to come back in one month to have your diabetes rechecked.  I will discuss your lab tests at that time.    Frederic Jericho, MD

## 2020-02-24 NOTE — Progress Notes (Signed)
    SUBJECTIVE:   CHIEF COMPLAINT / HPI:   Diarrhea: Patient recently had a cholecystectomy in December.  Prior to that she was having episodes of diarrhea, but afterwards her diarrhea increased to upwards of 10 times a day.  She was sometimes having incontinence.  No blood in stool.  She was seen by the surgeon who did not prescribe her anything at follow-up visit.  She went to an urgent care and was prescribed cholestyramine, but did not want to take any new medications to control the diarrhea.  Instead she decreased her metformin to 500 mg once a day at breakfast and since that time her diarrhea and abdominal pain have resolved.  This is been 5 days.  Diabetes: Patient currently only taking 500 mg metformin right now due to gastrointestinal side effects of higher doses.  Had a A1c 2 months ago.  She is willing to follow-up in 1 month and recheck her A1c at that time.  And if necessary will use alternative medications to control diabetes.  Patient prefers insulin.  PERTINENT  PMH / PSH: Diabetes  OBJECTIVE:   BP 126/84   Pulse 100   Wt 204 lb (92.5 kg)   LMP 02/16/2020   SpO2 99%   BMI 39.84 kg/m   General: Alert and oriented.  No acute distress.  Sitting on examining table, accompanied by her husband and 2 children. CV: Regular rate and rhythm, no murmurs Pulmonary: Lungs clear to auscultation bilaterally GI: Soft, nontender.  Normal bowel sounds.  ASSESSMENT/PLAN:   Chronic diarrhea Appears to be due to metformin intolerance as her symptoms resolved after decreasing to 500 mg once a day.  May have a component of bile acid diarrhea given that it increased after her cholecystectomy.  We will follow-up in 1 month.  Type 2 diabetes mellitus without complication, without long-term current use of insulin (HCC) Patient reduced her metformin to 500 mg once a day.  Last A1c was 8.02 months ago.  We will follow-up in 1 month and depending what the A1c is we will use alternative medications  for diabetes management.  Encourage patient to limit carbohydrate intake and lose weight.     Sandre Kitty, MD [facility]

## 2020-02-25 DIAGNOSIS — K529 Noninfective gastroenteritis and colitis, unspecified: Secondary | ICD-10-CM | POA: Insufficient documentation

## 2020-02-25 DIAGNOSIS — K580 Irritable bowel syndrome with diarrhea: Secondary | ICD-10-CM | POA: Insufficient documentation

## 2020-02-25 LAB — BASIC METABOLIC PANEL
BUN/Creatinine Ratio: 18 (ref 9–23)
BUN: 13 mg/dL (ref 6–20)
CO2: 22 mmol/L (ref 20–29)
Calcium: 9.4 mg/dL (ref 8.7–10.2)
Chloride: 100 mmol/L (ref 96–106)
Creatinine, Ser: 0.72 mg/dL (ref 0.57–1.00)
GFR calc Af Amer: 124 mL/min/{1.73_m2} (ref >59)
GFR calc non Af Amer: 107 mL/min/{1.73_m2} (ref >59)
Glucose: 154 mg/dL — ABNORMAL HIGH (ref 65–99)
Potassium: 4 mmol/L (ref 3.5–5.2)
Sodium: 140 mmol/L (ref 134–144)

## 2020-02-25 LAB — LIPID PANEL
Chol/HDL Ratio: 4.6 ratio — ABNORMAL HIGH (ref 0.0–4.4)
Cholesterol, Total: 199 mg/dL (ref 100–199)
HDL: 43 mg/dL (ref >39)
LDL Chol Calc (NIH): 140 mg/dL — ABNORMAL HIGH (ref 0–99)
Triglycerides: 90 mg/dL (ref 0–149)
VLDL Cholesterol Cal: 16 mg/dL (ref 5–40)

## 2020-02-25 LAB — TSH+FREE T4
Free T4: 1.16 ng/dL (ref 0.82–1.77)
TSH: 1.03 u[IU]/mL (ref 0.450–4.500)

## 2020-02-25 NOTE — Assessment & Plan Note (Signed)
Patient reduced her metformin to 500 mg once a day.  Last A1c was 8.02 months ago.  We will follow-up in 1 month and depending what the A1c is we will use alternative medications for diabetes management.  Encourage patient to limit carbohydrate intake and lose weight.

## 2020-02-25 NOTE — Assessment & Plan Note (Signed)
>>  ASSESSMENT AND PLAN FOR CHRONIC DIARRHEA WRITTEN ON 02/25/2020 10:19 AM BY Sandre Kitty, MD  Appears to be due to metformin intolerance as her symptoms resolved after decreasing to 500 mg once a day.  May have a component of bile acid diarrhea given that it increased after her cholecystectomy.  We will follow-up in 1 month.

## 2020-02-25 NOTE — Assessment & Plan Note (Signed)
Appears to be due to metformin intolerance as her symptoms resolved after decreasing to 500 mg once a day.  May have a component of bile acid diarrhea given that it increased after her cholecystectomy.  We will follow-up in 1 month.

## 2020-03-06 ENCOUNTER — Encounter: Payer: Self-pay | Admitting: Family Medicine

## 2020-03-06 ENCOUNTER — Other Ambulatory Visit: Payer: Self-pay

## 2020-03-06 ENCOUNTER — Ambulatory Visit (INDEPENDENT_AMBULATORY_CARE_PROVIDER_SITE_OTHER): Payer: Medicaid Other | Admitting: Family Medicine

## 2020-03-06 VITALS — BP 140/80 | HR 112 | Wt 204.4 lb

## 2020-03-06 DIAGNOSIS — E119 Type 2 diabetes mellitus without complications: Secondary | ICD-10-CM

## 2020-03-06 LAB — GLUCOSE, POCT (MANUAL RESULT ENTRY): POC Glucose: 204 mg/dl — AB (ref 70–99)

## 2020-03-06 MED ORDER — LANTUS SOLOSTAR 100 UNIT/ML ~~LOC~~ SOPN: 10.0000 [IU] | PEN_INJECTOR | Freq: Every day | SUBCUTANEOUS | 11 refills | Status: DC

## 2020-03-06 NOTE — Patient Instructions (Signed)
We have decided to start you on Lantus(insulin) to help lower your blood sugars.   You will start on 10 units daily.   Please continue to check your fasting blood sugars and post-meal time levels.   Follow up in one week to check your tolerance and blood glucose levels and reassess need for changes.   Dr. Roxan Hockey

## 2020-03-06 NOTE — Progress Notes (Signed)
    SUBJECTIVE:   CHIEF COMPLAINT / HPI: hyperglycemia   Patient reports taking Metformin 500mg , last A1c 8 in Nov of 2021.  Patient states that she has been taking Metformin 500 mg daily as attempting to increase to 1000 mg worsened her diarrhea.  Patient is requesting to be placed back on insulin like when she was pregnant.  She states that when she was in the hospital after her gallbladder surgery, she was on insulin her blood sugars were within normal range.  She reports fasting blood sugars as high as 150s.  She states that her 2-hour postprandial blood sugars have been 180-190.  She states that when she was on insulin her postprandial blood sugars were in the 110s. She denies any symptoms of hypoglycemia. Patient prefers to not try any other oral anti-hyperglycemia agents. Patient reports she has been able to cut sugary beverages from her diet and stopped eating desserts. She limits bread,rice and pasta.    PERTINENT  PMH / PSH: DM  OBJECTIVE:   BP 140/80   Pulse (!) 112   Wt 204 lb 6.4 oz (92.7 kg)   LMP 02/16/2020   SpO2 98%   BMI 39.92 kg/m   General: female appearing stated age in no acute distress Cardio: Normal S1 and S2, no S3 or S4. Rhythm is regular. No murmurs or rubs.  Bilateral radial pulses palpable Pulm: Clear to auscultation bilaterally, no crackles, wheezing, or diminished breath sounds. Normal respiratory effort Abdomen: Bowel sounds normal. Abdomen soft and non-tender.  Extremities: No peripheral edema  ASSESSMENT/PLAN:   Type 2 diabetes mellitus without complication, without long-term current use of insulin (HCC) Based on patient preference will start lantus 10 units daily.  Patient to follow up in 1 week  CBG 204 today      04/15/2020, MD [facility]

## 2020-03-06 NOTE — Assessment & Plan Note (Signed)
Based on patient preference will start lantus 10 units daily.  Patient to follow up in 1 week  CBG 204 today

## 2020-03-14 ENCOUNTER — Ambulatory Visit: Payer: Medicaid Other

## 2020-03-14 NOTE — Progress Notes (Deleted)
    SUBJECTIVE:   CHIEF COMPLAINT / HPI: DM medication f/u   Type 2 diabetes Patient started on 10 units daily of Lantus 1 week ago.  She reports blood glucose measurements ranging between***. Patient denies symptoms of hypoglycemia.   PERTINENT  PMH / PSH: Type 2 diabetes  OBJECTIVE:   LMP 02/16/2020   General: *** appearing stated age in no acute distress HEENT: MMM, no oral lesions noted,Neck non-tender without lymphadenopathy Cardio: Normal S1 and S2, no S3 or S4. Rhythm is regular. No murmurs or rubs.  Bilateral radial pulses palpable Pulm: Clear to auscultation bilaterally, no crackles, wheezing, or diminished breath sounds. Normal respiratory effort Abdomen: Bowel sounds normal. Abdomen soft and non-tender.  Extremities: No peripheral edema. Warm & well perfused.  Neuro: pt alert and oriented x4, follows commands, PERRLA, EOMI bilaterally   ASSESSMENT/PLAN:   No problem-specific Assessment & Plan notes found for this encounter.   CBG Hemoglobin A1c Continue Lantus 10 units daily**  Ronnald Ramp, MD [facility]

## 2020-03-15 ENCOUNTER — Ambulatory Visit (INDEPENDENT_AMBULATORY_CARE_PROVIDER_SITE_OTHER): Payer: Medicaid Other | Admitting: Family Medicine

## 2020-03-15 ENCOUNTER — Ambulatory Visit: Payer: Medicaid Other

## 2020-03-15 ENCOUNTER — Other Ambulatory Visit: Payer: Self-pay

## 2020-03-15 VITALS — BP 118/70 | HR 72 | Wt 204.8 lb

## 2020-03-15 DIAGNOSIS — E119 Type 2 diabetes mellitus without complications: Secondary | ICD-10-CM

## 2020-03-15 DIAGNOSIS — M545 Low back pain, unspecified: Secondary | ICD-10-CM | POA: Diagnosis not present

## 2020-03-15 DIAGNOSIS — R399 Unspecified symptoms and signs involving the genitourinary system: Secondary | ICD-10-CM | POA: Insufficient documentation

## 2020-03-15 LAB — POCT URINALYSIS DIP (MANUAL ENTRY)
Bilirubin, UA: NEGATIVE
Glucose, UA: >=1000 mg/dL — AB
Ketones, POC UA: NEGATIVE mg/dL
Leukocytes, UA: NEGATIVE
Nitrite, UA: NEGATIVE
Protein Ur, POC: NEGATIVE mg/dL
Spec Grav, UA: 1.02 (ref 1.010–1.025)
Urobilinogen, UA: 0.2 E.U./dL
pH, UA: 6 (ref 5.0–8.0)

## 2020-03-15 LAB — POCT GLYCOSYLATED HEMOGLOBIN (HGB A1C): Hemoglobin A1C: 7.3 % — AB (ref 4.0–5.6)

## 2020-03-15 LAB — POCT UA - MICROSCOPIC ONLY

## 2020-03-15 MED ORDER — LANTUS SOLOSTAR 100 UNIT/ML ~~LOC~~ SOPN: 10.0000 [IU] | PEN_INJECTOR | Freq: Every day | SUBCUTANEOUS | 11 refills | Status: DC

## 2020-03-15 MED ORDER — ACCU-CHEK SOFTCLIX LANCETS MISC: 12 refills | Status: DC

## 2020-03-15 NOTE — Progress Notes (Addendum)
      SUBJECTIVE:   CHIEF COMPLAINT / HPI:   Elizabeth Warren is a 40 y.o. female presents for diabetic follow up.  Pt declined interpretor.  Diabetes Patient's current diabetic medications include lantus 10U once a day. Is not taking metformin. Tolerating well without side effects.  Patient endorses compliance with these medications. CBG readings averaging in the 150-170s range.  Patient's last A1c was  Lab Results  Component Value Date   HGBA1C 7.3 (A) 03/15/2020   HGBA1C 8.0 (A) 12/06/2019   HGBA1C 7.7 (A) 09/01/2019   Denies abdominal pain, blurred vision, polyuria, polydipsia, hypoglycemia. Patient states they understand that diet and exercise can help with her diabetes.   Last Microalbumin, LDL, Creatinine: Lab Results  Component Value Date   LDLCALC 140 (H) 02/24/2020   CREATININE 0.72 02/24/2020   Lower abdo pain Lower pelvic pain started 2 days ago.  Took 3 abx pills-amoxicillin from husband yesterday which helped. No fevers, dysuria, frequency, urgency or hematuria.   PERTINENT  PMH / PSH: DM, cholecystitis   OBJECTIVE:   BP 118/70   Pulse 72   Wt 204 lb 12.8 oz (92.9 kg)   LMP 02/16/2020   SpO2 99%   BMI 40.00 kg/m    General: Alert, no acute distress Cardio: well perfused Pulm: normal work of breathing Abdomen: Bowel sounds normal. Abdomen soft, tender lower pelvis, no guarding. No renal angle tenderness Neuro: Cranial nerves grossly intact   ASSESSMENT/PLAN:   Type 2 diabetes mellitus without complication, without long-term current use of insulin (HCC) A1c 7.3 improved from 8. Congratulated patient and encouraged her to keep up her efforts. Discussed option of starting GLP1agonist or SGTL2 inhibitor for added cardio and renal protection however pt is adamant to continue Lantus 10U daily. Follow up in 3 months with PCP where she can discuss this further.  Urinary tract infection symptoms Reports lower pelvic pain and urinary sx however UA negative for  nitrites or leuks. Sent urine for culture. Sx could be 2/2 menstruation. Considered pyelonephritis however no renal angle tenderness which is reassuring. Will not treat with abx now and wait until culture results come back.      Towanda Octave, MD  PGY-2 [facility]

## 2020-03-15 NOTE — Patient Instructions (Signed)
Thank you for coming to see me today. It was a pleasure. Today we discussed your diabetes. It is better than before. Keep up the great work!  I will call you if the urine culture grows anything and I can send you medicine.  If you have any questions or concerns, please do not hesitate to call the office at [phone].  If you develop fevers>100.5, shortness of breath, chest pain, palpitations, dizziness, abdominal pain, nausea, vomiting, diarrhea or cannot eat or drink then please go to the ER immediately.  Best wishes,   Dr Allena Katz

## 2020-03-15 NOTE — Assessment & Plan Note (Addendum)
Reports lower pelvic pain and urinary sx however UA negative for nitrites or leuks. Sent urine for culture. Sx could be 2/2 menstruation. Considered pyelonephritis however no renal angle tenderness which is reassuring. Will not treat with abx now and wait until culture results come back.

## 2020-03-15 NOTE — Assessment & Plan Note (Signed)
A1c 7.3 improved from 8. Congratulated patient and encouraged her to keep up her efforts. Discussed option of starting GLP1agonist or SGTL2 inhibitor for added cardio and renal protection however pt is adamant to continue Lantus 10U daily. Follow up in 3 months with PCP where she can discuss this further.

## 2020-07-13 ENCOUNTER — Other Ambulatory Visit: Payer: Self-pay | Admitting: Family Medicine

## 2020-07-27 ENCOUNTER — Other Ambulatory Visit: Payer: Self-pay

## 2020-07-27 ENCOUNTER — Encounter: Payer: Self-pay | Admitting: Family Medicine

## 2020-07-27 ENCOUNTER — Ambulatory Visit (INDEPENDENT_AMBULATORY_CARE_PROVIDER_SITE_OTHER): Payer: Medicaid Other | Admitting: Family Medicine

## 2020-07-27 VITALS — BP 120/76 | HR 67 | Wt 204.0 lb

## 2020-07-27 DIAGNOSIS — K529 Noninfective gastroenteritis and colitis, unspecified: Secondary | ICD-10-CM

## 2020-07-27 DIAGNOSIS — O24112 Pre-existing diabetes mellitus, type 2, in pregnancy, second trimester: Secondary | ICD-10-CM

## 2020-07-27 DIAGNOSIS — E119 Type 2 diabetes mellitus without complications: Secondary | ICD-10-CM

## 2020-07-27 LAB — POCT GLYCOSYLATED HEMOGLOBIN (HGB A1C): HbA1c, POC (controlled diabetic range): 8.1 % — AB (ref 0.0–7.0)

## 2020-07-27 MED ORDER — INSULIN ISOPHANE & REGULAR (HUMAN 70-30)100 UNIT/ML KWIKPEN: 5.0000 [IU] | PEN_INJECTOR | Freq: Two times a day (BID) | SUBCUTANEOUS | 3 refills | Status: DC

## 2020-07-27 MED ORDER — CHOLESTYRAMINE 4 G PO PACK: 4.0000 g | PACK | Freq: Three times a day (TID) | ORAL | 0 refills | Status: DC

## 2020-07-27 NOTE — Patient Instructions (Addendum)
Diarrhea  Take cholestyramine for diarrhea.  Start taking 2 g twice a day with meals for one week.  If no improvement, you can increase to 4 g twice a day with meals for one week.  If no improvement, you can, then increase to 4 g three times a day with meals  Come back if you continue to have diarrhea   Diabetes  10 units in the morning before breakfast  5 units in the evening with snack  Check your sugars twice a day, write down and bring back to your next appointment.  Follow up in 1 month

## 2020-07-27 NOTE — Assessment & Plan Note (Signed)
>>  ASSESSMENT AND PLAN FOR CHRONIC DIARRHEA WRITTEN ON 07/27/2020  1:15 PM BY Melene Plan, MD  Patient using loperamide.  Have recommended that she is to cholestyramine and provided directions for titration of this medication.  Follow-up in 2 to 3 weeks if no improvement with cholestyramine.

## 2020-07-27 NOTE — Assessment & Plan Note (Signed)
Patient using loperamide.  Have recommended that she is to cholestyramine and provided directions for titration of this medication.  Follow-up in 2 to 3 weeks if no improvement with cholestyramine.

## 2020-07-27 NOTE — Progress Notes (Signed)
    SUBJECTIVE:   CHIEF COMPLAINT / HPI:   Patient is accompanied with her son today.  She declines an interpreter.  Her son acts as the interpreter.   Diabetes follow-up Patient's A1c today is 8.1.  Patient reports her sugars at home are 165, 175, 190.  She is taking 10 units of Lantus each morning.  She denies any episodes of hypoglycemia.  She is not following a diabetic diet.  Patient would like to be switched back to her old medication regimen.  She pulls up a picture of Actrapid and Humalog vials on her phone.  She reports that she is to take a little bit of each and that was what best controlled her diabetes a few years ago.  She reports that it is the medication that all of her family takes and she would like to be back on it.  She reports that the Lantus is no good for her.  She does not have any specific side effects to the Lantus.  She does report that she takes extra dose of Lantus that she checks her sugar during the day and it is high.  Patient reporting loose stools since having gallbladder surgery.  She has been taking loperamide at home as needed.  On [facility] stool chart, she reports that she has all the different stool types.  She reports she is not taking cholestyramine.  Previously noted to possibly be due to metformin which was then decreased to 500 mg.  PERTINENT  PMH / PSH: Status postcholecystectomy, diabetes, obesity  OBJECTIVE:   BP 120/76   Pulse 67   Wt 204 lb (92.5 kg)   SpO2 98%   BMI 39.84 kg/m   General: Well-appearing female, no acute distress HEENT: Moist mucous membranes Respiratory: Speaking in full sentences MSK: No lower extremity edema.  Moving all extremities spontaneously.  ASSESSMENT/PLAN:   Type 2 diabetes mellitus with hyperglycemia (HCC) Spoke to patient in detail about different insulin types.  Recommended that she stay on the Lantus for ease of dosing and as it is longer acting and with less risk of hypoglycemia.  She is adamant that we  switch her back to the medication that she was on before.  She understands that this will be in pen form and not in the vials. Discussed hypoglycemia and ensuring that she takes the medication with meals. Swtiching to Humulin 70/30, 10 in AM, 5 in PM. She will continue to check her CBGs twice daily and bring them back to her next appointment.  Discussed goal glucose of <120. Recommend follow-up in 2 weeks for titration, return sooner if CBGs persistently elevated. Recommend follow up with Dr. Nicholaus Bloom. Will forward chart to her as well.   Chronic diarrhea Patient using loperamide.  Have recommended that she is to cholestyramine and provided directions for titration of this medication.  Follow-up in 2 to 3 weeks if no improvement with cholestyramine.    Melene Plan, MD [facility]

## 2020-07-27 NOTE — Assessment & Plan Note (Signed)
Spoke to patient in detail about different insulin types.  Recommended that she stay on the Lantus for ease of dosing and as it is longer acting and with less risk of hypoglycemia.  She is adamant that we switch her back to the medication that she was on before.  She understands that this will be in pen form and not in the vials. Discussed hypoglycemia and ensuring that she takes the medication with meals. Swtiching to Humulin 70/30, 10 in AM, 5 in PM. She will continue to check her CBGs twice daily and bring them back to her next appointment.  Discussed goal glucose of <120. Recommend follow-up in 2 weeks for titration, return sooner if CBGs persistently elevated. Recommend follow up with Dr. Nicholaus Bloom. Will forward chart to her as well.

## 2020-08-04 ENCOUNTER — Other Ambulatory Visit: Payer: Self-pay | Admitting: Family Medicine

## 2020-08-09 ENCOUNTER — Ambulatory Visit (INDEPENDENT_AMBULATORY_CARE_PROVIDER_SITE_OTHER): Payer: Medicaid Other | Admitting: Family Medicine

## 2020-08-09 ENCOUNTER — Other Ambulatory Visit: Payer: Self-pay

## 2020-08-09 VITALS — BP 122/70 | HR 103 | Ht 60.0 in | Wt 206.4 lb

## 2020-08-09 DIAGNOSIS — K529 Noninfective gastroenteritis and colitis, unspecified: Secondary | ICD-10-CM | POA: Diagnosis not present

## 2020-08-09 MED ORDER — LOPERAMIDE HCL 2 MG PO CAPS: 2.0000 mg | ORAL_CAPSULE | Freq: Every day | ORAL | 1 refills | Status: DC | PRN

## 2020-08-09 NOTE — Progress Notes (Signed)
    SUBJECTIVE:   CHIEF COMPLAINT / HPI: diarrhea  Chronic diarrhea Seen in clinic for chronic diarrhea 06/23.  Started on Cholestyramine.  Has not been able to tolerate medication.  Medication makes her vomit.  She resumed OTC Loperamide 2 mg daily.  Reports that she has had this diarrhea since removal of gallbladder 12/21.  Diarrhea is daily.  Denies any abdominal pain, bloody stool.   PERTINENT  PMH / PSH:  Cholecystectomy DM Type 2  OBJECTIVE:   BP 122/70   Pulse (!) 103   Ht 5' (1.524 m)   Wt 206 lb 6.4 oz (93.6 kg)   SpO2 97%   BMI 40.31 kg/m    General: Alert, no acute distress Abdomen: Bowel sounds normal. Abdomen soft and non-tender.   ASSESSMENT/PLAN:   Chronic diarrhea Not tolerating Cholestyramine. Continue Loperamide 2 mg daily, max dose 8 mg daily Have advised that she try to wean in a couple of months to see if have resolved Follow up with PCP as needed or if symptoms worsen     Dana Allan, MD [facility]

## 2020-08-14 ENCOUNTER — Encounter: Payer: Self-pay | Admitting: Family Medicine

## 2020-08-14 NOTE — Assessment & Plan Note (Signed)
>>  ASSESSMENT AND PLAN FOR CHRONIC DIARRHEA WRITTEN ON 08/14/2020  1:01 PM BY Dana Allan, MD  Not tolerating Cholestyramine. Continue Loperamide 2 mg daily, max dose 8 mg daily Have advised that she try to wean in a couple of months to see if have resolved Follow up with PCP as needed or if symptoms worsen

## 2020-08-14 NOTE — Assessment & Plan Note (Signed)
Not tolerating Cholestyramine. Continue Loperamide 2 mg daily, max dose 8 mg daily Have advised that she try to wean in a couple of months to see if have resolved Follow up with PCP as needed or if symptoms worsen

## 2020-08-22 ENCOUNTER — Other Ambulatory Visit: Payer: Self-pay | Admitting: Family Medicine

## 2020-08-25 ENCOUNTER — Emergency Department (HOSPITAL_COMMUNITY)
Admission: EM | Admit: 2020-08-25 | Discharge: 2020-08-26 | Discharge status: Against Medical Advice | Payer: Medicaid Other

## 2020-08-26 ENCOUNTER — Other Ambulatory Visit: Payer: Self-pay

## 2020-08-26 ENCOUNTER — Emergency Department (HOSPITAL_COMMUNITY): Payer: Medicaid Other

## 2020-08-26 ENCOUNTER — Encounter (HOSPITAL_COMMUNITY): Payer: Self-pay | Admitting: Emergency Medicine

## 2020-08-26 ENCOUNTER — Emergency Department (HOSPITAL_COMMUNITY)
Admission: EM | Admit: 2020-08-26 | Discharge: 2020-08-26 | Disposition: A | Discharge status: Home / Self Care | Payer: Medicaid Other | Attending: Emergency Medicine | Admitting: Emergency Medicine

## 2020-08-26 DIAGNOSIS — S99912A Unspecified injury of left ankle, initial encounter: Secondary | ICD-10-CM | POA: Diagnosis present

## 2020-08-26 DIAGNOSIS — Z7984 Long term (current) use of oral hypoglycemic drugs: Secondary | ICD-10-CM | POA: Insufficient documentation

## 2020-08-26 DIAGNOSIS — M7989 Other specified soft tissue disorders: Secondary | ICD-10-CM | POA: Diagnosis not present

## 2020-08-26 DIAGNOSIS — S93402A Sprain of unspecified ligament of left ankle, initial encounter: Secondary | ICD-10-CM | POA: Insufficient documentation

## 2020-08-26 DIAGNOSIS — E119 Type 2 diabetes mellitus without complications: Secondary | ICD-10-CM | POA: Insufficient documentation

## 2020-08-26 DIAGNOSIS — W108XXA Fall (on) (from) other stairs and steps, initial encounter: Secondary | ICD-10-CM | POA: Insufficient documentation

## 2020-08-26 DIAGNOSIS — Z794 Long term (current) use of insulin: Secondary | ICD-10-CM | POA: Insufficient documentation

## 2020-08-26 DIAGNOSIS — M25572 Pain in left ankle and joints of left foot: Secondary | ICD-10-CM | POA: Diagnosis not present

## 2020-08-26 MED ORDER — NAPROXEN 500 MG PO TABS: 500.0000 mg | ORAL_TABLET | Freq: Two times a day (BID) | ORAL | 0 refills | Status: DC

## 2020-08-26 MED ORDER — OXYCODONE-ACETAMINOPHEN 5-325 MG PO TABS
1.0000 | ORAL_TABLET | Freq: Once | ORAL | Status: AC
  Administered 2020-08-26: 1 via ORAL
  Filled 2020-08-26: qty 1

## 2020-08-26 NOTE — Discharge Instructions (Addendum)
Can wear brace and use crutches for now.  Progress back to normal weight bearing as tolerated. Can ice and elevate ankle to bring swelling down. Medication for pain sent to pharmacy.  Can take tylenol with this if needed. Follow-up with your primary care doctor. Return here for new concerns.

## 2020-08-26 NOTE — ED Notes (Signed)
Patient transported to X-ray 

## 2020-08-26 NOTE — ED Triage Notes (Addendum)
Pt c/o left ankle pain from fall that occurred at 9 pm. Swelling present in area. No LOC. Pt did not hit head. Pt unable to bear weight.

## 2020-08-26 NOTE — ED Provider Notes (Signed)
[facility] DEPT Provider Note   CSN: [id number] Arrival date & time: 08/26/20  0009     History Chief Complaint  Patient presents with   Ankle Pain    left    Elizabeth Warren is a 40 y.o. female.  The history is provided by the patient and medical records.  Ankle Pain  40 year old female presenting to the ED with left ankle pain.  States she was coming down the stairs to make her children some food, she turned around and missed the last step causing her to twist her ankle.  There was no head injury or loss of consciousness.  States she has not been able to ambulate or bear weight on her left foot since this occurred.  Most of the pain is along the lateral left ankle.  She denies any injury to this area in the past.  No medication taken prior to arrival.  Past Medical History:  Diagnosis Date   Acute cholecystitis 01/18/2020   Gestational diabetes    History of gestational diabetes 09/06/2016   Hypertension    pregnancy   NVD (normal vaginal delivery) 09/20/2010   Pre-existing type 2 diabetes mellitus during pregnancy in second trimester 10/02/2016    Patient Active Problem List   Diagnosis Date Noted   Urinary tract infection symptoms 03/15/2020   Chronic diarrhea 02/25/2020   Low TSH level 09/03/2019   Type 2 diabetes mellitus with hyperglycemia ([place]) 09/01/2019   Obesity (BMI 30-39.9) 10/31/2011    Past Surgical History:  Procedure Laterality Date   CHOLECYSTECTOMY N/A 01/19/2020   Procedure: LAPAROSCOPIC CHOLECYSTECTOMY;  Surgeon: Stark Klein, MD;  Location: [facility];  Service: General;  Laterality: N/A;   NO PAST SURGERIES       OB History     Gravida  5   Para  5   Term  5   Preterm  0   AB  0   Living  5      SAB  0   IAB  0   Ectopic  0   Multiple  0   Live Births  5           Family History  Problem Relation Age of Onset   Diabetes Mother    Hypertension Mother    Diabetes Father    Hypertension  Father    Diabetes Maternal Grandmother    Hypertension Maternal Grandmother    Diabetes Maternal Grandfather    Hypertension Maternal Grandfather    Diabetes Paternal Grandmother    Hypertension Paternal Grandmother    Diabetes Paternal Grandfather    Hypertension Paternal Grandfather    Cancer Neg Hx    Heart disease Neg Hx    Stroke Neg Hx     Social History   Tobacco Use   Smoking status: Never   Smokeless tobacco: Never  Vaping Use   Vaping Use: Never used  Substance Use Topics   Alcohol use: No   Drug use: No    Home Medications Prior to Admission medications   Medication Sig Start Date End Date Taking? Authorizing Provider  Accu-Chek Softclix Lancets lancets Use as instructed 03/15/20   Lattie Haw, MD  Blood Glucose Monitoring Suppl (ACCU-CHEK GUIDE ME) w/Device KIT 1 Units by Does not apply route every other day. 09/03/19   Gifford Shave, MD  cholestyramine (QUESTRAN) 4 g packet [place] 1 PACKET(4 GRAMS) BY MOUTH WITH BREAKFAST, LUNCH, AND EVENING MEAL 08/04/20   Simmons-Robinson, Riki Sheer, MD  glucose blood (ACCU-CHEK GUIDE) test strip Use as instructed 09/03/19   Gifford Shave, MD  ibuprofen (MOTRIN IB) 200 MG tablet Take 3 tablets (600 mg total) by mouth every 8 (eight) hours as needed. 01/19/20 01/18/21  Saverio Danker, PA-C  insulin isophane & regular human (HUMULIN 70/30 MIX) (70-30) 100 UNIT/ML KwikPen Inject 5-10 Units into the skin in the morning and at bedtime. 10 units in the morning before breakfast and 5 units in the PM with snack 07/27/20   Wilber Oliphant, MD  loperamide (HM LOPERAMIDE HCL) 2 MG capsule Take 1 capsule (2 mg total) by mouth daily as needed for diarrhea or loose stools. 08/09/20   Carollee Leitz, MD  metFORMIN (GLUCOPHAGE-XR) 500 MG 24 hr tablet TAKE 1 TABLET BY MOUTH ONCE DAILY WITH BREAKFAST FOR 2 WEEKS, THEN INCREASE TO 1 TABLET WITH BREAKFAST AND 1 TABLET WITH DINNER FOR 2 WEEKS. 07/13/20   Simmons-Robinson, Riki Sheer, MD  ondansetron  (ZOFRAN ODT) 4 MG disintegrating tablet Take 1 tablet (4 mg total) by mouth every 8 (eight) hours as needed. Patient taking differently: Take 4 mg by mouth every 8 (eight) hours as needed for nausea or vomiting. 01/12/20   McDonald, Mia A, PA-C  oxyCODONE (OXY IR/ROXICODONE) 5 MG immediate release tablet Take 1 tablet (5 mg total) by mouth every 4 (four) hours as needed for moderate pain. 01/19/20   Saverio Danker, PA-C  Prenatal 27-1 MG TABS Take 1 tablet by mouth daily. 02/14/20   Wilber Oliphant, MD    Allergies    Patient has no known allergies.  Review of Systems   Review of Systems  Musculoskeletal:  Positive for arthralgias.  All other systems reviewed and are negative.  Physical Exam Updated Vital Signs BP 135/85 (BP Location: Left Arm)   Pulse 97   Temp 98.8 F (37.1 C) (Oral)   Resp 18   Ht 5' (1.524 m)   Wt 93.4 kg   SpO2 98%   BMI 40.23 kg/m   Physical Exam Vitals and nursing note reviewed.  Constitutional:      Appearance: She is well-developed.  HENT:     Head: Normocephalic and atraumatic.  Eyes:     Conjunctiva/sclera: Conjunctivae normal.     Pupils: Pupils are equal, round, and reactive to light.  Cardiovascular:     Rate and Rhythm: Normal rate and regular rhythm.     Heart sounds: Normal heart sounds.  Pulmonary:     Effort: Pulmonary effort is normal.     Breath sounds: Normal breath sounds.  Abdominal:     General: Bowel sounds are normal.     Palpations: Abdomen is soft.  Musculoskeletal:        General: Normal range of motion.     Cervical back: Normal range of motion.     Comments: Left ankle with swelling surrounding the lateral malleolus, there is no acute deformity, DP pulse intact, wiggling toes on command, normal distal sensation and perfusion  Skin:    General: Skin is warm and dry.  Neurological:     Mental Status: She is alert and oriented to person, place, and time.    ED Results / Procedures / Treatments   Labs (all labs ordered  are listed, but only abnormal results are displayed) Labs Reviewed - No data to display  EKG None  Radiology DG Ankle Complete Left  Result Date: 08/26/2020 CLINICAL DATA:  Fall, left ankle pain, unable to bear weight EXAM: LEFT ANKLE COMPLETE - 3+ VIEW  COMPARISON:  None. FINDINGS: Circumferential soft tissue swelling of the ankle most pronounced over the lateral malleolus with an associated trace ankle joint effusion. Ankle mortise remains congruent. No clear acute fracture or traumatic osseous injury is identified. Included portions of the mid and hindfoot are unremarkable in these nondedicated, non weight-bearing radiographs. IMPRESSION: Circumferential soft tissue swelling of the ankle, most pronounced laterally, with trace effusion. No acute osseous abnormality. Electronically Signed   By: Lovena Le M.D.   On: 08/26/2020 00:50    Procedures Procedures   Medications Ordered in ED Medications  oxyCODONE-acetaminophen (PERCOCET/ROXICET) 5-325 MG per tablet 1 tablet (1 tablet Oral Given 08/26/20 0112)    ED Course  I have reviewed the triage vital signs and the nursing notes.  Pertinent labs & imaging results that were available during my care of the patient were reviewed by me and considered in my medical decision making (see chart for details).    MDM Rules/Calculators/A&P                           40 year old female here with left ankle pain after a fall on stairs.  There is no head injury or loss of consciousness.  Has a lot of swelling around the lateral aspect of the ankle but no acute bony deformity.  Her foot is neurovascular intact.  X-rays negative.  Suspect sprain.  Will place in [facility], give crutches.  Ice and elevation at home, anti-inflammatories (has normal SrCr).  Can follow-up with PCP.  Return here for new concerns.  Final Clinical Impression(s) / ED Diagnoses Final diagnoses:  Sprain of left ankle, unspecified ligament, initial encounter    Rx / DC Orders ED  Discharge Orders     None        Larene Pickett, PA-C 08/26/20 0117    Margette Fast, MD 08/26/20 0630

## 2020-08-29 ENCOUNTER — Other Ambulatory Visit: Payer: Self-pay | Admitting: Family Medicine

## 2020-09-21 ENCOUNTER — Other Ambulatory Visit: Payer: Self-pay | Admitting: Family Medicine

## 2020-09-26 ENCOUNTER — Other Ambulatory Visit: Payer: Self-pay | Admitting: Family Medicine

## 2020-09-27 ENCOUNTER — Other Ambulatory Visit: Payer: Self-pay | Admitting: Family Medicine

## 2020-09-27 DIAGNOSIS — O24112 Pre-existing diabetes mellitus, type 2, in pregnancy, second trimester: Secondary | ICD-10-CM

## 2020-10-08 ENCOUNTER — Other Ambulatory Visit: Payer: Self-pay | Admitting: Family Medicine

## 2020-10-19 ENCOUNTER — Other Ambulatory Visit: Payer: Self-pay

## 2020-10-19 ENCOUNTER — Ambulatory Visit (HOSPITAL_COMMUNITY)
Admission: EM | Admit: 2020-10-19 | Discharge: 2020-10-19 | Disposition: A | Discharge status: Home / Self Care | Payer: Medicaid Other | Attending: Internal Medicine | Admitting: Internal Medicine

## 2020-10-19 ENCOUNTER — Ambulatory Visit: Payer: Medicaid Other | Admitting: Family Medicine

## 2020-10-19 ENCOUNTER — Encounter (HOSPITAL_COMMUNITY): Payer: Self-pay

## 2020-10-19 DIAGNOSIS — Z794 Long term (current) use of insulin: Secondary | ICD-10-CM | POA: Insufficient documentation

## 2020-10-19 DIAGNOSIS — R1030 Lower abdominal pain, unspecified: Secondary | ICD-10-CM | POA: Diagnosis not present

## 2020-10-19 DIAGNOSIS — N390 Urinary tract infection, site not specified: Secondary | ICD-10-CM

## 2020-10-19 DIAGNOSIS — M545 Low back pain, unspecified: Secondary | ICD-10-CM | POA: Diagnosis not present

## 2020-10-19 DIAGNOSIS — E1165 Type 2 diabetes mellitus with hyperglycemia: Secondary | ICD-10-CM

## 2020-10-19 DIAGNOSIS — R739 Hyperglycemia, unspecified: Secondary | ICD-10-CM | POA: Insufficient documentation

## 2020-10-19 LAB — POCT URINALYSIS DIPSTICK, ED / UC
Bilirubin Urine: NEGATIVE
Glucose, UA: 250 mg/dL — AB
Ketones, ur: NEGATIVE mg/dL
Leukocytes,Ua: NEGATIVE
Nitrite: NEGATIVE
Protein, ur: NEGATIVE mg/dL
Specific Gravity, Urine: >=1.03 (ref 1.005–1.030)
Urobilinogen, UA: 0.2 mg/dL (ref 0.0–1.0)
pH: 5.5 (ref 5.0–8.0)

## 2020-10-19 MED ORDER — CEPHALEXIN 500 MG PO CAPS: 500.0000 mg | ORAL_CAPSULE | Freq: Three times a day (TID) | ORAL | 0 refills | Status: DC

## 2020-10-19 MED ORDER — FLUCONAZOLE 150 MG PO TABS: 150.0000 mg | ORAL_TABLET | Freq: Every day | ORAL | 0 refills | Status: DC

## 2020-10-19 MED ORDER — NAPROXEN 500 MG PO TABS: 500.0000 mg | ORAL_TABLET | Freq: Two times a day (BID) | ORAL | 0 refills | Status: DC

## 2020-10-19 NOTE — ED Provider Notes (Signed)
[facility]    CSN: [id number] Arrival date & time: 10/19/20  1619      History   Chief Complaint Chief Complaint  Patient presents with   Back Pain   Abdominal Pain   Hyperglycemia    HPI EUPHEMIA LINGERFELT is a 40 y.o. female.   Patient presents today with a 1 week history of UTI symptoms.  She reports lower back pain, lower abdominal pain, urinary frequency, urinary urgency.  She denies any dysuria, hematuria, fever, nausea, vomiting, changes in bowel habits.  She denies any recent antibiotic use.  She reports pain is rated 5 on a 0-10 pain scale, localized to lower abdomen with radiation into back, described as aching, worse with palpation or movement, no alleviating factors identified.  She does have a history of UTI and states current symptoms are similar to previous episodes of this condition.  She denies history of nephrolithiasis, chronic kidney disease, single kidney, recent urogenital procedure, self-catheterization.  She denies any bowel/bladder incontinence, lower extremity weakness, saddle anesthesia, difficulty ambulating.  She has tried ibuprofen over-the-counter without improvement of symptoms.  Reports symptoms are interfering with her bloody perform daily activities.  She does report that over the past several weeks her blood sugars have been elevated and she is unsure if this is related to an infection or if high blood sugar is causing urinary symptoms.  Reports fasting has generally been around 200.  She is currently managed with Lantus alone (10 units nightly) as she had side effects with metformin and other oral medications.  She denies any recent dietary changes.  She denies any polydipsia or polyphagia.  She is monitoring her blood sugar regularly.  Patient is confident she is not pregnant   Past Medical History:  Diagnosis Date   Acute cholecystitis 01/18/2020   Gestational diabetes    History of gestational diabetes 09/06/2016   Hypertension    pregnancy    NVD (normal vaginal delivery) 09/20/2010   Pre-existing type 2 diabetes mellitus during pregnancy in second trimester 10/02/2016    Patient Active Problem List   Diagnosis Date Noted   Urinary tract infection symptoms 03/15/2020   Chronic diarrhea 02/25/2020   Low TSH level 09/03/2019   Type 2 diabetes mellitus with hyperglycemia ([place]) 09/01/2019   Obesity (BMI 30-39.9) 10/31/2011    Past Surgical History:  Procedure Laterality Date   CHOLECYSTECTOMY N/A 01/19/2020   Procedure: LAPAROSCOPIC CHOLECYSTECTOMY;  Surgeon: Stark Klein, MD;  Location: [facility];  Service: General;  Laterality: N/A;   NO PAST SURGERIES      OB History     Gravida  5   Para  5   Term  5   Preterm  0   AB  0   Living  5      SAB  0   IAB  0   Ectopic  0   Multiple  0   Live Births  5            Home Medications    Prior to Admission medications   Medication Sig Start Date End Date Taking? Authorizing Provider  cephALEXin (KEFLEX) 500 MG capsule Take 1 capsule (500 mg total) by mouth 3 (three) times daily. 10/19/20  Yes Abbye Lao K, PA-C  fluconazole (DIFLUCAN) 150 MG tablet Take 1 tablet (150 mg total) by mouth daily. Can take second dose 72 hours after first if symptoms persist. 10/19/20  Yes Vivi Piccirilli K, PA-C  Accu-Chek Softclix Lancets lancets Use as  instructed 03/15/20   Lattie Haw, MD  Blood Glucose Monitoring Suppl (ACCU-CHEK GUIDE ME) w/Device KIT 1 Units by Does not apply route every other day. 09/03/19   Gifford Shave, MD  cholestyramine (QUESTRAN) 4 g packet MIX AND DRINK 1 PACKET(4 GRAMS) BY MOUTH WITH BREAKFAST, LUNCH, AND EVENING MEAL 08/04/20   Simmons-Robinson, [place], MD  glucose blood (ACCU-CHEK GUIDE) test strip USE AS DIRECTED 09/21/20   Autry-Lott, Naaman Plummer, DO  HUMULIN 70/30 KWIKPEN (70-30) 100 UNIT/ML KwikPen INJECT 10 UNITS UNDER THE SKIN EVERY MORNING BEFORE BREAKFAST AND 5 UNITS EVERY EVENING WITH SNACK. 09/27/20   Simmons-Robinson, Riki Sheer, MD   loperamide (IMODIUM) 2 MG capsule TAKE 1 CAPSULE(2 MG) BY MOUTH DAILY AS NEEDED FOR DIARRHEA OR LOOSE STOOLS 09/26/20   Simmons-Robinson, Riki Sheer, MD  naproxen (NAPROSYN) 500 MG tablet Take 1 tablet (500 mg total) by mouth 2 (two) times daily with a meal. 10/19/20   Tylen Leverich K, PA-C  ondansetron (ZOFRAN ODT) 4 MG disintegrating tablet Take 1 tablet (4 mg total) by mouth every 8 (eight) hours as needed. Patient taking differently: Take 4 mg by mouth every 8 (eight) hours as needed for nausea or vomiting. 01/12/20   McDonald, Mia A, PA-C  Prenatal 27-1 MG TABS Take 1 tablet by mouth daily. 02/14/20   Wilber Oliphant, MD    Family History Family History  Problem Relation Age of Onset   Diabetes Mother    Hypertension Mother    Diabetes Father    Hypertension Father    Diabetes Maternal Grandmother    Hypertension Maternal Grandmother    Diabetes Maternal Grandfather    Hypertension Maternal Grandfather    Diabetes Paternal Grandmother    Hypertension Paternal Grandmother    Diabetes Paternal Grandfather    Hypertension Paternal Grandfather    Cancer Neg Hx    Heart disease Neg Hx    Stroke Neg Hx     Social History Social History   Tobacco Use   Smoking status: Never   Smokeless tobacco: Never  Vaping Use   Vaping Use: Never used  Substance Use Topics   Alcohol use: No   Drug use: No     Allergies   Patient has no known allergies.   Review of Systems Review of Systems  Constitutional:  Positive for activity change. Negative for appetite change, fatigue and fever.  Respiratory:  Negative for cough and shortness of breath.   Cardiovascular:  Negative for chest pain.  Gastrointestinal:  Positive for abdominal pain. Negative for diarrhea, nausea and vomiting.  Genitourinary:  Positive for frequency and urgency. Negative for dysuria, hematuria, vaginal bleeding, vaginal discharge and vaginal pain.  Musculoskeletal:  Positive for back pain. Negative for arthralgias and  myalgias.  Neurological:  Negative for dizziness, light-headedness and headaches.    Physical Exam Triage Vital Signs ED Triage Vitals [10/19/20 1723]  Enc Vitals Group     BP 119/83     Pulse Rate 91     Resp 16     Temp 97.6 F (36.4 C)     Temp Source Oral     SpO2 96 %     Weight      Height      Head Circumference      Peak Flow      Pain Score      Pain Loc      Pain Edu?      Excl. in [place]?    No data found.  Updated Vital Signs BP  119/83 (BP Location: Right Arm)   Pulse 91   Temp 97.6 F (36.4 C) (Oral)   Resp 16   SpO2 96%   Visual Acuity Right Eye Distance:   Left Eye Distance:   Bilateral Distance:    Right Eye Near:   Left Eye Near:    Bilateral Near:     Physical Exam Vitals reviewed.  Constitutional:      General: She is awake. She is not in acute distress.    Appearance: Normal appearance. She is well-developed. She is not ill-appearing.     Comments: Very pleasant female appears stated age no acute distress sitting comfortably in exam room  HENT:     Head: Normocephalic and atraumatic.  Cardiovascular:     Rate and Rhythm: Normal rate and regular rhythm.     Heart sounds: S1 normal and S2 normal. Murmur heard.  Pulmonary:     Effort: Pulmonary effort is normal.     Breath sounds: Normal breath sounds. No wheezing, rhonchi or rales.     Comments: Clear to auscultation bilaterally Abdominal:     General: Bowel sounds are normal.     Palpations: Abdomen is soft.     Tenderness: There is abdominal tenderness in the right lower quadrant, suprapubic area and left lower quadrant. There is no right CVA tenderness, left CVA tenderness, guarding or rebound.     Comments: Tenderness palpation throughout lower abdomen.  No evidence of acute abdomen on physical exam.  No CVA tenderness.  Genitourinary:    Comments: Exam deferred Musculoskeletal:     Cervical back: No tenderness or bony tenderness.     Thoracic back: No tenderness or bony tenderness.      Lumbar back: Tenderness present. No bony tenderness. Negative right straight leg raise test and negative left straight leg raise test.     Right lower leg: No edema.     Left lower leg: No edema.     Comments: Back: Normal active range of motion.  No pain percussion of vertebrae.  No deformity or step-off noted.  Mild tenderness palpation in bilateral lumbar paraspinal muscles.  Negative straight leg raise bilaterally.  Psychiatric:        Behavior: Behavior is cooperative.     UC Treatments / Results  Labs (all labs ordered are listed, but only abnormal results are displayed) Labs Reviewed  POCT URINALYSIS DIPSTICK, ED / UC - Abnormal; Notable for the following components:      Result Value   Glucose, UA 250 (*)    Hgb urine dipstick TRACE (*)    All other components within normal limits  URINE CULTURE    EKG   Radiology No results found.  Procedures Procedures (including critical care time)  Medications Ordered in UC Medications - No data to display  Initial Impression / Assessment and Plan / UC Course  I have reviewed the triage vital signs and the nursing notes.  Pertinent labs & imaging results that were available during my care of the patient were reviewed by me and considered in my medical decision making (see chart for details).      Vital signs and physical exam are reassuring today with no indication for emergent evaluation or imaging.  UA does not show significant evidence of infection but given clinical presentation with lower abdominal pain/back pain and urinary symptoms we will go ahead and treat with Keflex 500 mg 3 times daily.  Patient is diabetic and prone to yeast infection so she was  given 2 doses of Diflucan to be used as needed with yeast infection symptoms.  She was given Naprosyn to help manage back pain with instruction not to take NSAIDs including aspirin, ibuprofen/Advil, naproxen/Aleve due to risk of GI bleeding.  She can use Tylenol for  additional symptom relief.  Her blood sugars have been elevated so we will increase insulin to 12 units; recommend that she continue to monitor her blood sugars regularly and if fasting blood sugars persistently above 180 she should be reevaluated for insulin adjustment.  Encouraged her to follow-up with her primary care provider within a week to ensure symptom improvement.  Discussed that we are unable to get imaging so she continues to have abdominal pain or if at any point anything worsens she needs to go to the emergency room.  Discussed alarm symptoms that warrant emergent evaluation.  Strict return precautions given to which she expressed understanding.  Final Clinical Impressions(s) / UC Diagnoses   Final diagnoses:  Lower urinary tract infectious disease  Hyperglycemia  Type 2 diabetes mellitus with hyperglycemia, with long-term current use of insulin (HCC)  Lower abdominal pain  Acute bilateral low back pain without sciatica     Discharge Instructions      I have called in Keflex to treat for urinary tract infection given your symptoms.  We will send your urine off for culture and if this is negative you can stop the medication.  If we need to change your antibiotic we will contact you.  I have also called in Diflucan in case you get a yeast infection with this medicine.  Use Naprosyn at night to help with your back pain.  You should not take NSAIDs including aspirin, ibuprofen/Advil, naproxen/Aleve with this medication as it can cause stomach bleeding.  We have increased your insulin to 12 units.  Please continue to monitor your fasting blood sugar if this is persistently above 180 please return to either our clinic or see your primary care provider so we can adjust her insulin further.  Avoid carbohydrates and drink plenty of fluid.  If you have any worsening symptoms please return for reevaluation.     ED Prescriptions     Medication Sig Dispense Auth. Provider   naproxen  (NAPROSYN) 500 MG tablet Take 1 tablet (500 mg total) by mouth 2 (two) times daily with a meal. 30 tablet Myka Lukins K, PA-C   cephALEXin (KEFLEX) 500 MG capsule Take 1 capsule (500 mg total) by mouth 3 (three) times daily. 21 capsule Eran Windish K, PA-C   fluconazole (DIFLUCAN) 150 MG tablet Take 1 tablet (150 mg total) by mouth daily. Can take second dose 72 hours after first if symptoms persist. 2 tablet Ilianna Bown K, PA-C      PDMP not reviewed this encounter.   Terrilee Croak, PA-C 10/19/20 1811

## 2020-10-19 NOTE — Discharge Instructions (Addendum)
I have called in Keflex to treat for urinary tract infection given your symptoms.  We will send your urine off for culture and if this is negative you can stop the medication.  If we need to change your antibiotic we will contact you.  I have also called in Diflucan in case you get a yeast infection with this medicine.  Use Naprosyn at night to help with your back pain.  You should not take NSAIDs including aspirin, ibuprofen/Advil, naproxen/Aleve with this medication as it can cause stomach bleeding.  We have increased your insulin to 12 units.  Please continue to monitor your fasting blood sugar if this is persistently above 180 please return to either our clinic or see your primary care provider so we can adjust her insulin further.  Avoid carbohydrates and drink plenty of fluid.  If you have any worsening symptoms please return for reevaluation.

## 2020-10-19 NOTE — ED Triage Notes (Signed)
Patient presents to Urgent Care with complaints of back pain x 1 week, abdominal pain x 2-3 days. She also reports elevated BG x 1 week. Pt states she recently noted her fasting BG in the 190's and with meals it is 250-270's range. Treating pain with advil.   Denies fever, n/v.

## 2020-10-20 LAB — URINE CULTURE: Culture: <10000 — AB

## 2020-10-25 ENCOUNTER — Ambulatory Visit: Payer: Medicaid Other

## 2020-10-30 ENCOUNTER — Ambulatory Visit (INDEPENDENT_AMBULATORY_CARE_PROVIDER_SITE_OTHER): Payer: Medicaid Other | Admitting: Family Medicine

## 2020-10-30 ENCOUNTER — Other Ambulatory Visit: Payer: Self-pay

## 2020-10-30 VITALS — BP 110/70 | HR 117 | Wt 209.0 lb

## 2020-10-30 DIAGNOSIS — M79672 Pain in left foot: Secondary | ICD-10-CM | POA: Diagnosis not present

## 2020-10-30 DIAGNOSIS — E1165 Type 2 diabetes mellitus with hyperglycemia: Secondary | ICD-10-CM

## 2020-10-30 DIAGNOSIS — Z794 Long term (current) use of insulin: Secondary | ICD-10-CM | POA: Diagnosis not present

## 2020-10-30 LAB — POCT GLYCOSYLATED HEMOGLOBIN (HGB A1C): HbA1c, POC (controlled diabetic range): 8.3 % — AB (ref 0.0–7.0)

## 2020-10-30 MED ORDER — DICLOFENAC SODIUM 75 MG PO TBEC: 75.0000 mg | DELAYED_RELEASE_TABLET | ORAL | 0 refills | Status: AC | PRN

## 2020-10-30 MED ORDER — MULTIVITAMINS PO CAPS: 1.0000 | ORAL_CAPSULE | Freq: Every day | ORAL | 3 refills | Status: AC

## 2020-10-30 NOTE — Progress Notes (Signed)
     SUBJECTIVE:   CHIEF COMPLAINT / HPI:   Elizabeth Warren is a 40 y.o. female presents for  left foot pain  Left foot pain Twisted her ankle 2 months ago at home. Seen in the ER, ankle xray was normal. She now endorses pain over the left side of left ankle. Alleviated by ibuprofen a little at times. She also puts ice on the air. Walking makes it worse. She tries to walk more to help with diabetic control.  Diabetes Patient's current diabetic medications include lantus 16 units.Tolerating well without side effects.  Patient endorses compliance with these medications. CBG readings averaging in the 200s.  Patient's last A1c was  Lab Results  Component Value Date   HGBA1C 8.3 (A) 10/30/2020   HGBA1C 8.1 (A) 07/27/2020   HGBA1C 7.3 (A) 03/15/2020  Denies abdominal pain, blurred vision, polyuria, polydipsia, hypoglycemia. Patient states they understand that diet and exercise can help with her diabetes.    Last Microalbumin, LDL, Creatinine: Lab Results  Component Value Date   LDLCALC 140 (H) 02/24/2020   CREATININE 0.72 02/24/2020     Flowsheet Row Office Visit from 10/30/2020 in [facility]  PHQ-9 Total Score 4       PERTINENT  PMH / PSH: DM  OBJECTIVE:   BP 110/70   Pulse (!) 117   Wt 209 lb (94.8 kg)   SpO2 98%   BMI 40.82 kg/m    General: Alert, no acute distress Cardio: well perfused  Pulm: normal work of breathing Extremities: No peripheral edema.  Neuro: Cranial nerves grossly intact   Foot: Inspection:  No obvious bony deformity.  Mild erythema and bruising over left lateral foot.  Normal arch Palpation: No tenderness to palpation ROM: Full ROM of the ankle. Normal midfoot flexibility Strength: 5/5 strength ankle in all planes Neurovascular: N/V intact distally in the lower extremity Special tests: Negative anterior drawer. Negative squeeze. normal midfoot flexibility.  Normal gait   ASSESSMENT/PLAN:   Type 2 diabetes mellitus  with hyperglycemia (HCC) A1c 8.1, worse from previous which was 7.3. Discussed different options for SGLT2 inhibitors, GLP 1 agonist, metformin etc and the added benefit of these medications. Pt declined and very much enjoys using Lantus. She would only like to use Lantus only as her family members in [place] recommend this medication over others. Increased Lantus to 22 units. Follow up A1c in 3 months.  Left foot pain On going left foot pain since she twisted her ankle in July. Explained that xray in July did not reveal fractures. On exam mild bruising over left lateral foot. Likely soft tissue (ligament injury). Recommended rest, ice, ankle support, adequate foot wear as pt wears wedge heels frequently. Prescribed short course of diclofenac for the pain. Follow up with PCP if persistent sx. Would recommend referral to [facility] in the future.    Towanda Octave, MD PGY-3 [facility]

## 2020-10-30 NOTE — Patient Instructions (Signed)
Thank you for coming to see me today. It was a pleasure. Today we discussed your diabetes. I recommend: -Increasing lantus 22 units daily -stop taking prenatal vitamins -Start multivitamins -Sending diclofenac as needed for the foot pain. It is most likely a soft tissue injury. Make sure you rest, use ice and wear good footwear.   Please follow-up in 3 months for diabetes check  If you have any questions or concerns, please do not hesitate to call the office at [phone].  Best wishes,   Dr Allena Katz

## 2020-11-01 DIAGNOSIS — M79672 Pain in left foot: Secondary | ICD-10-CM | POA: Insufficient documentation

## 2020-11-01 NOTE — Assessment & Plan Note (Signed)
On going left foot pain since she twisted her ankle in July. Explained that xray in July did not reveal fractures. On exam mild bruising over left lateral foot. Likely soft tissue (ligament injury). Recommended rest, ice, ankle support, adequate foot wear as pt wears wedge heels frequently. Prescribed short course of diclofenac for the pain. Follow up with PCP if persistent sx. Would recommend referral to [facility] in the future.

## 2020-11-01 NOTE — Assessment & Plan Note (Signed)
A1c 8.1, worse from previous which was 7.3. Discussed different options for SGLT2 inhibitors, GLP 1 agonist, metformin etc and the added benefit of these medications. Pt declined and very much enjoys using Lantus. She would only like to use Lantus only as her family members in [place] recommend this medication over others. Increased Lantus to 22 units. Follow up A1c in 3 months.

## 2020-11-15 ENCOUNTER — Other Ambulatory Visit: Payer: Self-pay | Admitting: Family Medicine

## 2020-11-15 MED ORDER — INSULIN GLARGINE 100 UNIT/ML SOLOSTAR PEN: 16.0000 [IU] | PEN_INJECTOR | Freq: Every day | SUBCUTANEOUS | 4 refills | Status: DC

## 2020-11-15 MED ORDER — ACCU-CHEK GUIDE VI STRP: ORAL_STRIP | 3 refills | Status: DC

## 2020-11-15 MED ORDER — ACCU-CHEK SOFTCLIX LANCETS MISC: 12 refills | Status: DC

## 2020-11-15 NOTE — Telephone Encounter (Signed)
Patient calls nurse line stating she needs a refill on lantus. This is no longer on her medication list. Please advise.

## 2020-12-14 ENCOUNTER — Other Ambulatory Visit: Payer: Self-pay | Admitting: Family Medicine

## 2020-12-17 ENCOUNTER — Other Ambulatory Visit: Payer: Self-pay

## 2020-12-17 ENCOUNTER — Emergency Department (HOSPITAL_BASED_OUTPATIENT_CLINIC_OR_DEPARTMENT_OTHER)
Admission: EM | Admit: 2020-12-17 | Discharge: 2020-12-17 | Disposition: A | Discharge status: Home / Self Care | Payer: Medicaid Other | Attending: Emergency Medicine | Admitting: Emergency Medicine

## 2020-12-17 ENCOUNTER — Encounter (HOSPITAL_BASED_OUTPATIENT_CLINIC_OR_DEPARTMENT_OTHER): Payer: Self-pay | Admitting: Obstetrics and Gynecology

## 2020-12-17 DIAGNOSIS — Z794 Long term (current) use of insulin: Secondary | ICD-10-CM | POA: Diagnosis not present

## 2020-12-17 DIAGNOSIS — J101 Influenza due to other identified influenza virus with other respiratory manifestations: Secondary | ICD-10-CM | POA: Diagnosis not present

## 2020-12-17 DIAGNOSIS — R059 Cough, unspecified: Secondary | ICD-10-CM | POA: Diagnosis present

## 2020-12-17 DIAGNOSIS — M273 Alveolitis of jaws: Secondary | ICD-10-CM | POA: Diagnosis not present

## 2020-12-17 DIAGNOSIS — E119 Type 2 diabetes mellitus without complications: Secondary | ICD-10-CM | POA: Diagnosis not present

## 2020-12-17 DIAGNOSIS — Z20822 Contact with and (suspected) exposure to covid-19: Secondary | ICD-10-CM | POA: Insufficient documentation

## 2020-12-17 LAB — RESP PANEL BY RT-PCR (FLU A&B, COVID) ARPGX2
Influenza A by PCR: POSITIVE — AB
Influenza B by PCR: NEGATIVE
SARS Coronavirus 2 by RT PCR: NEGATIVE

## 2020-12-17 MED ORDER — HYDROCODONE-ACETAMINOPHEN 5-325 MG PO TABS: 2.0000 | ORAL_TABLET | ORAL | 0 refills | Status: DC | PRN

## 2020-12-17 MED ORDER — CLINDAMYCIN HCL 150 MG PO CAPS: 300.0000 mg | ORAL_CAPSULE | Freq: Four times a day (QID) | ORAL | 0 refills | Status: AC

## 2020-12-17 MED ORDER — BENZONATATE 100 MG PO CAPS: 100.0000 mg | ORAL_CAPSULE | Freq: Three times a day (TID) | ORAL | 0 refills | Status: DC

## 2020-12-17 MED ORDER — ONDANSETRON 4 MG PO TBDP: 4.0000 mg | ORAL_TABLET | Freq: Three times a day (TID) | ORAL | 0 refills | Status: DC | PRN

## 2020-12-17 MED ORDER — LIDOCAINE VISCOUS HCL 2 % MT SOLN
15.0000 mL | Freq: Once | OROMUCOSAL | Status: AC
  Administered 2020-12-17: 15 mL via OROMUCOSAL
  Filled 2020-12-17: qty 15

## 2020-12-17 MED ORDER — HYDROCODONE-ACETAMINOPHEN 5-325 MG PO TABS
1.0000 | ORAL_TABLET | Freq: Once | ORAL | Status: AC
  Administered 2020-12-17: 1 via ORAL
  Filled 2020-12-17: qty 1

## 2020-12-17 MED ORDER — OSELTAMIVIR PHOSPHATE 75 MG PO CAPS: 75.0000 mg | ORAL_CAPSULE | Freq: Two times a day (BID) | ORAL | 0 refills | Status: DC

## 2020-12-17 NOTE — ED Triage Notes (Signed)
Patient reports to the ER for flu like symptoms. Patient states her teeth even hurt. Patient reports her Childrens have been sick for the last week.

## 2020-12-17 NOTE — Discharge Instructions (Addendum)
You were evaluated in the Emergency Department and after careful evaluation, we did not find any emergent condition requiring admission or further testing in the hospital.  Your exam/testing today was overall reassuring.  Your post wisdom tooth extraction pain could be a developing dry socket.  A prescription for pain medicine has been sent to your pharmacy on file.  Additionally you have tested positive for influenza.  A prescription for Tamiflu has also been sent to your pharmacy.  Recommend ibuprofen and Tylenol for pain and fever, continued oral hydration.  Your dry socket will be treated with an antibiotic.  Recommend you call your dentist/surgeon tomorrow for follow-up.  Please return to the Emergency Department if you experience any worsening of your condition.  Thank you for allowing [place] to be a part of your care.

## 2020-12-17 NOTE — ED Provider Notes (Signed)
[place] EMERGENCY DEPT Provider Note   CSN: [id number] Arrival date & time: 12/17/20  1536     History Chief Complaint  Patient presents with   Cough   Fever   Headache    Elizabeth Warren is a 40 y.o. female.   Cough Associated symptoms: fever, headaches and myalgias   Fever Associated symptoms: cough, headaches and myalgias   Headache Associated symptoms: cough, fever and myalgias    40 year old female presenting to the emergency department with a flulike illness.  Her 2 children are also sick with upper respiratory infectious symptoms of cough, nasal congestion, subjective fevers.  She states that over the past 2 days she is also developed symptoms of cough, congestion, sore throat, headache, myalgias.  Additionally, she states that she is postop from wisdom tooth extraction last Thursday and has developed worsening pain in her empty socket.  Past Medical History:  Diagnosis Date   Acute cholecystitis 01/18/2020   Gestational diabetes    History of gestational diabetes 09/06/2016   Hypertension    pregnancy   NVD (normal vaginal delivery) 09/20/2010   Pre-existing type 2 diabetes mellitus during pregnancy in second trimester 10/02/2016    Patient Active Problem List   Diagnosis Date Noted   Left foot pain 11/01/2020   Chronic diarrhea 02/25/2020   Low TSH level 09/03/2019   Type 2 diabetes mellitus with hyperglycemia ([place]) 09/01/2019   Obesity (BMI 30-39.9) 10/31/2011    Past Surgical History:  Procedure Laterality Date   CHOLECYSTECTOMY N/A 01/19/2020   Procedure: LAPAROSCOPIC CHOLECYSTECTOMY;  Surgeon: Stark Klein, MD;  Location: [facility];  Service: General;  Laterality: N/A;   NO PAST SURGERIES       OB History     Gravida  5   Para  5   Term  5   Preterm  0   AB  0   Living  5      SAB  0   IAB  0   Ectopic  0   Multiple  0   Live Births  5           Family History  Problem Relation Age of Onset   Diabetes  Mother    Hypertension Mother    Diabetes Father    Hypertension Father    Diabetes Maternal Grandmother    Hypertension Maternal Grandmother    Diabetes Maternal Grandfather    Hypertension Maternal Grandfather    Diabetes Paternal Grandmother    Hypertension Paternal Grandmother    Diabetes Paternal Grandfather    Hypertension Paternal Grandfather    Cancer Neg Hx    Heart disease Neg Hx    Stroke Neg Hx     Social History   Tobacco Use   Smoking status: Never   Smokeless tobacco: Never  Vaping Use   Vaping Use: Never used  Substance Use Topics   Alcohol use: No   Drug use: No    Home Medications Prior to Admission medications   Medication Sig Start Date End Date Taking? Authorizing Provider  clindamycin (CLEOCIN) 150 MG capsule Take 2 capsules (300 mg total) by mouth 4 (four) times daily for 5 days. 12/17/20 12/22/20 Yes Regan Lemming, MD  HYDROcodone-acetaminophen (NORCO/VICODIN) 5-325 MG tablet Take 2 tablets by mouth every 4 (four) hours as needed. 12/17/20  Yes Regan Lemming, MD  oseltamivir (TAMIFLU) 75 MG capsule Take 1 capsule (75 mg total) by mouth every 12 (twelve) hours. 12/17/20  Yes Regan Lemming, MD  Accu-Chek  Softclix Lancets lancets Use as instructed 11/15/20   Simmons-Robinson, Makiera, MD  Blood Glucose Monitoring Suppl (ACCU-CHEK GUIDE ME) w/Device KIT 1 Units by Does not apply route every other day. 09/03/19   Gifford Shave, MD  cephALEXin (KEFLEX) 500 MG capsule Take 1 capsule (500 mg total) by mouth 3 (three) times daily. 10/19/20   Raspet, Derry Skill, PA-C  cholestyramine (QUESTRAN) 4 g packet MIX AND DRINK 1 PACKET(4 GRAMS) BY MOUTH WITH BREAKFAST, LUNCH, AND EVENING MEAL 08/04/20   Simmons-Robinson, Makiera, MD  fluconazole (DIFLUCAN) 150 MG tablet Take 1 tablet (150 mg total) by mouth daily. Can take second dose 72 hours after first if symptoms persist. 10/19/20   Raspet, Junie Panning K, PA-C  glucose blood (ACCU-CHEK GUIDE) test strip USE AS DIRECTED 11/15/20    Simmons-Robinson, Riki Sheer, MD  insulin glargine (LANTUS) 100 UNIT/ML Solostar Pen Inject 16 Units into the skin daily. 11/15/20   Simmons-Robinson, Makiera, MD  loperamide (IMODIUM) 2 MG capsule TAKE 1 CAPSULE(2 MG) BY MOUTH DAILY AS NEEDED FOR DIARRHEA OR LOOSE STOOLS 11/15/20   Simmons-Robinson, Riki Sheer, MD  naproxen (NAPROSYN) 500 MG tablet Take 1 tablet (500 mg total) by mouth 2 (two) times daily with a meal. 10/19/20   Raspet, Erin K, PA-C  ondansetron (ZOFRAN ODT) 4 MG disintegrating tablet Take 1 tablet (4 mg total) by mouth every 8 (eight) hours as needed. 12/17/20   Regan Lemming, MD    Allergies    Patient has no known allergies.  Review of Systems   Review of Systems  Constitutional:  Positive for fever.  HENT:  Positive for dental problem.   Respiratory:  Positive for cough.   Musculoskeletal:  Positive for myalgias.  Neurological:  Positive for headaches.   Physical Exam Updated Vital Signs BP (!) 165/107   Pulse 89   Temp 98.2 F (36.8 C)   Resp 18   Ht 5' 5"  (1.651 m)   Wt 91.7 kg   SpO2 100%   BMI 33.66 kg/m   Physical Exam Vitals and nursing note reviewed.  Constitutional:      General: She is not in acute distress.    Appearance: She is well-developed.  HENT:     Head: Normocephalic and atraumatic.     Mouth/Throat:      Comments: Apparent dry socket with no clot present status post wisdom tooth extraction Eyes:     Conjunctiva/sclera: Conjunctivae normal.  Cardiovascular:     Rate and Rhythm: Normal rate and regular rhythm.     Heart sounds: No murmur heard. Pulmonary:     Effort: Pulmonary effort is normal. No respiratory distress.     Breath sounds: Normal breath sounds.  Abdominal:     Palpations: Abdomen is soft.     Tenderness: There is no abdominal tenderness.  Musculoskeletal:     Cervical back: Neck supple.  Skin:    General: Skin is warm and dry.  Neurological:     Mental Status: She is alert.    ED Results / Procedures /  Treatments   Labs (all labs ordered are listed, but only abnormal results are displayed) Labs Reviewed  RESP PANEL BY RT-PCR (FLU A&B, COVID) ARPGX2 - Abnormal; Notable for the following components:      Result Value   Influenza A by PCR POSITIVE (*)    All other components within normal limits    EKG None  Radiology No results found.  Procedures Procedures   Medications Ordered in ED Medications  HYDROcodone-acetaminophen (NORCO/VICODIN) 5-325  MG per tablet 1 tablet (1 tablet Oral Given 12/17/20 1709)  lidocaine (XYLOCAINE) 2 % viscous mouth solution 15 mL (15 mLs Mouth/Throat Given 12/17/20 1709)    ED Course  I have reviewed the triage vital signs and the nursing notes.  Pertinent labs & imaging results that were available during my care of the patient were reviewed by me and considered in my medical decision making (see chart for details).  Clinical Course as of 12/17/20 Mcarthur Rossetti Dec 17, 2020  1741 Influenza A By PCR(!): POSITIVE [JL]    Clinical Course User Index [JL] Regan Lemming, MD   MDM Rules/Calculators/A&P                           40 year old female presenting to the emergency department with a flulike illness.  Her 2 children are also sick with upper respiratory infectious symptoms of cough, nasal congestion, subjective fevers.  She states that over the past 2 days she is also developed symptoms of cough, congestion, sore throat, headache, myalgias.  Additionally, she states that she is postop from wisdom tooth extraction last Thursday and has developed worsening pain in her empty socket.  COVID-19 and influenza testing was collected and resulted positive for influenza A.  On physical exam, developing dry socket appears to be present.  We will treat with Norco and clindamycin have the patient follow-up with her dentist.  Additionally, given the duration of symptoms and on patient request, will treat with Tamiflu.  Recommend continued ibuprofen and Tylenol  for pain control and fever and continued oral hydration.  Vitals reviewed and significant for afebrile, not tachycardic or tachypneic, saturating well on room air.  Lungs were clear to auscultation bilaterally.  Overall stable for discharge with continued outpatient management.  Final Clinical Impression(s) / ED Diagnoses Final diagnoses:  Influenza A  Dry socket    Rx / DC Orders ED Discharge Orders          Ordered    HYDROcodone-acetaminophen (NORCO/VICODIN) 5-325 MG tablet  Every 4 hours PRN        12/17/20 1722    oseltamivir (TAMIFLU) 75 MG capsule  Every 12 hours        12/17/20 1722    ondansetron (ZOFRAN ODT) 4 MG disintegrating tablet  Every 8 hours PRN        12/17/20 1723    clindamycin (CLEOCIN) 150 MG capsule  4 times daily        12/17/20 1755             Regan Lemming, MD 12/17/20 1758

## 2020-12-18 ENCOUNTER — Telehealth: Payer: Self-pay

## 2020-12-18 NOTE — Telephone Encounter (Signed)
Transition Care Management Follow-up Telephone Call Date of discharge and from where: 12/17/2020 from  How have you been since you were released from the hospital? Pt stated that she is feeling much better. Pt did not have any questions or concerns at this time.  Any questions or concerns? No  Items Reviewed: Did the pt receive and understand the discharge instructions provided? Yes  Medications obtained and verified? Yes  Other? No  Any new allergies since your discharge? No  Dietary orders reviewed? No Do you have support at home? Yes   Functional Questionnaire: (I = Independent and D = Dependent) ADLs: I  Bathing/Dressing- I  Meal Prep- I  Eating- I  Maintaining continence- I  Transferring/Ambulation- I  Managing Meds- I   Follow up appointments reviewed:  PCP Hospital f/u appt confirmed? No   Specialist Hospital f/u appt confirmed? No   Are transportation arrangements needed? No  If their condition worsens, is the pt aware to call PCP or go to the Emergency Dept.? Yes Was the patient provided with contact information for the PCP's office or ED? Yes Was to pt encouraged to call back with questions or concerns? Yes

## 2020-12-21 ENCOUNTER — Other Ambulatory Visit: Payer: Self-pay

## 2020-12-21 ENCOUNTER — Encounter (HOSPITAL_BASED_OUTPATIENT_CLINIC_OR_DEPARTMENT_OTHER): Payer: Self-pay | Admitting: Emergency Medicine

## 2020-12-21 ENCOUNTER — Emergency Department (HOSPITAL_BASED_OUTPATIENT_CLINIC_OR_DEPARTMENT_OTHER)
Admission: EM | Admit: 2020-12-21 | Discharge: 2020-12-21 | Disposition: A | Discharge status: Home / Self Care | Payer: Medicaid Other | Attending: Emergency Medicine | Admitting: Emergency Medicine

## 2020-12-21 DIAGNOSIS — I10 Essential (primary) hypertension: Secondary | ICD-10-CM | POA: Diagnosis not present

## 2020-12-21 DIAGNOSIS — K0889 Other specified disorders of teeth and supporting structures: Secondary | ICD-10-CM | POA: Insufficient documentation

## 2020-12-21 DIAGNOSIS — E119 Type 2 diabetes mellitus without complications: Secondary | ICD-10-CM | POA: Diagnosis not present

## 2020-12-21 DIAGNOSIS — Z794 Long term (current) use of insulin: Secondary | ICD-10-CM | POA: Insufficient documentation

## 2020-12-21 MED ORDER — TRAMADOL HCL 50 MG PO TABS: ORAL_TABLET | ORAL | 0 refills | Status: DC

## 2020-12-21 MED ORDER — KETOROLAC TROMETHAMINE 60 MG/2ML IM SOLN
30.0000 mg | Freq: Once | INTRAMUSCULAR | Status: AC
  Administered 2020-12-21: 13:00:00 30 mg via INTRAMUSCULAR
  Filled 2020-12-21: qty 2

## 2020-12-21 NOTE — ED Provider Notes (Signed)
[place] EMERGENCY DEPT Provider Note   CSN: [id number] Arrival date & time: 12/21/20  1027     History Chief Complaint  Patient presents with   Dental Pain    Elizabeth Warren is a 40 y.o. female is emergency department for evaluation of right lower dental pain after an extraction "last week".  Patient reports she does not member the exact day of her extraction, but mentions it was sometime "last week".  She mentions she was seen here a few days ago and reports the pain medicine she got does not help her.  She is still taking her antibiotic, clindamycin.  She denies any headache, sore throat, cough, fevers, chills.  The patient's been able to eat and drink, although her pain is exacerbated by it.  No relieving factors.  Medical history includes insulin-dependent diabetes.  Cholecystectomy for surgery history. No known drug allergies.  Denies any tobacco, EtOH, or drug use.   Dental Pain Associated symptoms: no congestion, no drooling, no facial swelling, no fever and no oral lesions       Past Medical History:  Diagnosis Date   Acute cholecystitis 01/18/2020   Gestational diabetes    History of gestational diabetes 09/06/2016   Hypertension    pregnancy   NVD (normal vaginal delivery) 09/20/2010   Pre-existing type 2 diabetes mellitus during pregnancy in second trimester 10/02/2016    Patient Active Problem List   Diagnosis Date Noted   Left foot pain 11/01/2020   Chronic diarrhea 02/25/2020   Low TSH level 09/03/2019   Type 2 diabetes mellitus with hyperglycemia ([place]) 09/01/2019   Obesity (BMI 30-39.9) 10/31/2011    Past Surgical History:  Procedure Laterality Date   CHOLECYSTECTOMY N/A 01/19/2020   Procedure: LAPAROSCOPIC CHOLECYSTECTOMY;  Surgeon: Stark Klein, MD;  Location: [facility];  Service: General;  Laterality: N/A;   NO PAST SURGERIES       OB History     Gravida  5   Para  5   Term  5   Preterm  0   AB  0   Living  5      SAB   0   IAB  0   Ectopic  0   Multiple  0   Live Births  5           Family History  Problem Relation Age of Onset   Diabetes Mother    Hypertension Mother    Diabetes Father    Hypertension Father    Diabetes Maternal Grandmother    Hypertension Maternal Grandmother    Diabetes Maternal Grandfather    Hypertension Maternal Grandfather    Diabetes Paternal Grandmother    Hypertension Paternal Grandmother    Diabetes Paternal Grandfather    Hypertension Paternal Grandfather    Cancer Neg Hx    Heart disease Neg Hx    Stroke Neg Hx     Social History   Tobacco Use   Smoking status: Never   Smokeless tobacco: Never  Vaping Use   Vaping Use: Never used  Substance Use Topics   Alcohol use: No   Drug use: No    Home Medications Prior to Admission medications   Medication Sig Start Date End Date Taking? Authorizing Provider  traMADol (ULTRAM) 50 MG tablet 1-2 tablets every 6 hours as needed for pain 12/21/20  Yes Pfeiffer, Jeannie Done, MD  Accu-Chek Softclix Lancets lancets Use as instructed 11/15/20   Simmons-Robinson, Makiera, MD  benzonatate (TESSALON) 100 MG capsule Take  1 capsule (100 mg total) by mouth every 8 (eight) hours. 12/17/20   Regan Lemming, MD  Blood Glucose Monitoring Suppl (ACCU-CHEK GUIDE ME) w/Device KIT 1 Units by Does not apply route every other day. 09/03/19   Gifford Shave, MD  cephALEXin (KEFLEX) 500 MG capsule Take 1 capsule (500 mg total) by mouth 3 (three) times daily. 10/19/20   Raspet, Derry Skill, PA-C  cholestyramine (QUESTRAN) 4 g packet MIX AND DRINK 1 PACKET(4 GRAMS) BY MOUTH WITH BREAKFAST, LUNCH, AND EVENING MEAL 08/04/20   Simmons-Robinson, Riki Sheer, MD  clindamycin (CLEOCIN) 150 MG capsule Take 2 capsules (300 mg total) by mouth 4 (four) times daily for 5 days. 12/17/20 12/22/20  Regan Lemming, MD  fluconazole (DIFLUCAN) 150 MG tablet Take 1 tablet (150 mg total) by mouth daily. Can take second dose 72 hours after first if symptoms persist.  10/19/20   Raspet, Junie Panning K, PA-C  glucose blood (ACCU-CHEK GUIDE) test strip USE AS DIRECTED 11/15/20   Simmons-Robinson, Riki Sheer, MD  HYDROcodone-acetaminophen (NORCO/VICODIN) 5-325 MG tablet Take 2 tablets by mouth every 4 (four) hours as needed. 12/17/20   Regan Lemming, MD  insulin glargine (LANTUS) 100 UNIT/ML Solostar Pen Inject 16 Units into the skin daily. 11/15/20   Simmons-Robinson, Makiera, MD  loperamide (IMODIUM) 2 MG capsule TAKE 1 CAPSULE(2 MG) BY MOUTH DAILY AS NEEDED FOR DIARRHEA OR LOOSE STOOLS 11/15/20   Simmons-Robinson, Riki Sheer, MD  naproxen (NAPROSYN) 500 MG tablet Take 1 tablet (500 mg total) by mouth 2 (two) times daily with a meal. 10/19/20   Raspet, Erin K, PA-C  ondansetron (ZOFRAN ODT) 4 MG disintegrating tablet Take 1 tablet (4 mg total) by mouth every 8 (eight) hours as needed. 12/17/20   Regan Lemming, MD  oseltamivir (TAMIFLU) 75 MG capsule Take 1 capsule (75 mg total) by mouth every 12 (twelve) hours. 12/17/20   Regan Lemming, MD    Allergies    Patient has no known allergies.  Review of Systems   Review of Systems  Constitutional:  Negative for chills and fever.  HENT:  Positive for dental problem. Negative for congestion, drooling, facial swelling, mouth sores, rhinorrhea, sinus pain, sore throat and trouble swallowing.    Physical Exam Updated Vital Signs BP 124/88 (BP Location: Right Arm)   Pulse 91   Temp 98.4 F (36.9 C) (Oral)   Resp 16   Ht _0  (1.651 m)   Wt 91.7 kg   SpO2 99%   BMI 33.64 kg/m   Physical Exam Vitals and nursing note reviewed.  Constitutional:      General: She is not in acute distress.    Appearance: Normal appearance. She is not toxic-appearing.  HENT:     Nose: Nose normal.     Mouth/Throat:     Mouth: Mucous membranes are moist.     Pharynx: Oropharynx is clear.     Comments: Healing dry socket in the far back right lower mandible. No surrounding erythema or swelling. Tender to palpation. No fluctuance or  induration noted.  Eyes:     General: No scleral icterus. Pulmonary:     Effort: Pulmonary effort is normal. No respiratory distress.  Skin:    General: Skin is dry.     Findings: No rash.  Neurological:     General: No focal deficit present.     Mental Status: She is alert. Mental status is at baseline.  Psychiatric:        Mood and Affect: Mood normal.    ED Results /  Procedures / Treatments   Labs (all labs ordered are listed, but only abnormal results are displayed) Labs Reviewed - No data to display  EKG None  Radiology No results found.  Procedures Procedures   Medications Ordered in ED Medications  ketorolac (TORADOL) injection 30 mg (30 mg Intramuscular Given 12/21/20 1244)    ED Course  I have reviewed the triage vital signs and the nursing notes.  Pertinent labs & imaging results that were available during my care of the patient were reviewed by me and considered in my medical decision making (see chart for details).  40 year old patient presents emergency department for evaluation of ongoing dental pain.  Patient was seen here on November 13 for the flu and the same dental pain where she was given Vicodin and clindamycin.  Patient reports she is still taking her clindamycin, but found no relief from Vicodin.  Patient reports she has images appointment on Monday, 4 days from now.  Patient reports she has called her dentist about this ongoing pain and they were able to get her in next week.  The patient is still eating and drinking, is in no acute distress.  Physical exam reassuring for no signs of obvious infection.  Since patient was recently on clindamycin, no antibiotic warranted at this time.  Will prescribe patient tramadol to last her till Monday is treated take with ibuprofen as needed for pain.  Patient given a shot of Toradol in the emergency department for pain. Return precautions given.  Patient agrees to plan.  Patient is stable and being discharged home  in good condition.    MDM Rules/Calculators/A&P                          Final Clinical Impression(s) / ED Diagnoses Final diagnoses:  Pain, dental    Rx / DC Orders ED Discharge Orders          Ordered    traMADol (ULTRAM) 50 MG tablet        12/21/20 1344             Sherrell Puller, PA-C 12/21/20 1348    Charlesetta Shanks, MD 12/22/20 1126

## 2020-12-21 NOTE — Discharge Instructions (Signed)
You are seen here today for dental pain.  It is important you follow-up with your dentist on Monday for reevaluation.  You been prescribed tramadol, a pain medication, to last you until your dentist appointment.  Please do not operate heavy machinery or drive on this medication.  Please see additional information on dental pain in this discharge paperwork.  If you have any concern, new or worsening symptoms, please return to the emergency department for evaluation.

## 2020-12-21 NOTE — ED Triage Notes (Signed)
Pt via pov from home with dental pain. Pt states she had a tooth pulled last week and is having pain. She reports that she cannot get an appointment with the dentist until next week. Pt alert &  oriented, nad noted.

## 2020-12-21 NOTE — ED Notes (Signed)
Pt given juice and graham crackers, per Susanna - RN 

## 2020-12-22 ENCOUNTER — Telehealth: Payer: Self-pay

## 2020-12-22 NOTE — Telephone Encounter (Signed)
Transition Care Management Unsuccessful Follow-up Telephone Call  Date of discharge and from where:  12/21/2020-[facility]  Attempts:  1st Attempt  Reason for unsuccessful TCM follow-up call:  Left voice message

## 2020-12-25 ENCOUNTER — Encounter (HOSPITAL_BASED_OUTPATIENT_CLINIC_OR_DEPARTMENT_OTHER): Payer: Self-pay

## 2020-12-25 ENCOUNTER — Other Ambulatory Visit: Payer: Self-pay

## 2020-12-25 ENCOUNTER — Emergency Department (HOSPITAL_BASED_OUTPATIENT_CLINIC_OR_DEPARTMENT_OTHER)
Admission: EM | Admit: 2020-12-25 | Discharge: 2020-12-25 | Disposition: A | Discharge status: Home / Self Care | Payer: Medicaid Other | Attending: Emergency Medicine | Admitting: Emergency Medicine

## 2020-12-25 DIAGNOSIS — Z794 Long term (current) use of insulin: Secondary | ICD-10-CM | POA: Insufficient documentation

## 2020-12-25 DIAGNOSIS — E119 Type 2 diabetes mellitus without complications: Secondary | ICD-10-CM | POA: Diagnosis not present

## 2020-12-25 DIAGNOSIS — R197 Diarrhea, unspecified: Secondary | ICD-10-CM | POA: Diagnosis not present

## 2020-12-25 LAB — URINALYSIS, ROUTINE W REFLEX MICROSCOPIC
Bilirubin Urine: NEGATIVE
Glucose, UA: NEGATIVE mg/dL
Hgb urine dipstick: NEGATIVE
Ketones, ur: NEGATIVE mg/dL
Leukocytes,Ua: NEGATIVE
Nitrite: NEGATIVE
Protein, ur: NEGATIVE mg/dL
Specific Gravity, Urine: 1.012 (ref 1.005–1.030)
pH: 5.5 (ref 5.0–8.0)

## 2020-12-25 LAB — COMPREHENSIVE METABOLIC PANEL
ALT: 21 U/L (ref 0–44)
AST: 16 U/L (ref 15–41)
Albumin: 4.4 g/dL (ref 3.5–5.0)
Alkaline Phosphatase: 63 U/L (ref 38–126)
Anion gap: 11 (ref 5–15)
BUN: 13 mg/dL (ref 6–20)
CO2: 24 mmol/L (ref 22–32)
Calcium: 10.2 mg/dL (ref 8.9–10.3)
Chloride: 100 mmol/L (ref 98–111)
Creatinine, Ser: 0.65 mg/dL (ref 0.44–1.00)
GFR, Estimated: >60 mL/min (ref >60)
Glucose, Bld: 161 mg/dL — ABNORMAL HIGH (ref 70–99)
Potassium: 3.9 mmol/L (ref 3.5–5.1)
Sodium: 135 mmol/L (ref 135–145)
Total Bilirubin: 0.2 mg/dL — ABNORMAL LOW (ref 0.3–1.2)
Total Protein: 8.2 g/dL — ABNORMAL HIGH (ref 6.5–8.1)

## 2020-12-25 LAB — CBC
HCT: 40.4 % (ref 36.0–46.0)
Hemoglobin: 13.5 g/dL (ref 12.0–15.0)
MCH: 27.6 pg (ref 26.0–34.0)
MCHC: 33.4 g/dL (ref 30.0–36.0)
MCV: 82.4 fL (ref 80.0–100.0)
Platelets: 377 10*3/uL (ref 150–400)
RBC: 4.9 MIL/uL (ref 3.87–5.11)
RDW: 12.9 % (ref 11.5–15.5)
WBC: 10.4 10*3/uL (ref 4.0–10.5)
nRBC: 0 % (ref 0.0–0.2)

## 2020-12-25 LAB — LIPASE, BLOOD: Lipase: 28 U/L (ref 11–51)

## 2020-12-25 LAB — PREGNANCY, URINE: Preg Test, Ur: NEGATIVE

## 2020-12-25 MED ORDER — DIPHENOXYLATE-ATROPINE 2.5-0.025 MG PO TABS: 2.0000 | ORAL_TABLET | Freq: Four times a day (QID) | ORAL | 0 refills | Status: DC | PRN

## 2020-12-25 MED ORDER — DIPHENOXYLATE-ATROPINE 2.5-0.025 MG PO TABS
2.0000 | ORAL_TABLET | Freq: Once | ORAL | Status: AC
  Administered 2020-12-25: 2 via ORAL
  Filled 2020-12-25: qty 2

## 2020-12-25 NOTE — ED Notes (Signed)
Discharge instructions reviewed with patient. Patient verbalized understanding. Patient ambulatory off unit . NAD noted reports husband is discharge ride home.

## 2020-12-25 NOTE — Telephone Encounter (Signed)
Transition Care Management Follow-up Telephone Call Date of discharge and from where: 12/21/2020 from [facility] How have you been since you were released from the hospital? Pt stated that she is feeling much better and did not have any questions or concerns at this time.  Any questions or concerns? 0No  Items Reviewed: Did the pt receive and understand the discharge instructions provided? Yes  Medications obtained and verified? Yes  Other? No  Any new allergies since your discharge? No  Dietary orders reviewed? No Do you have support at home? Yes   Functional Questionnaire: (I = Independent and D = Dependent) ADLs: I  Bathing/Dressing- I  Meal Prep- I  Eating- I  Maintaining continence- I  Transferring/Ambulation- I  Managing Meds- I   Follow up appointments reviewed:  PCP Hospital f/u appt confirmed? No  Specialist Hospital f/u appt confirmed? No   Are transportation arrangements needed? No  If their condition worsens, is the pt aware to call PCP or go to the Emergency Dept.? Yes Was the patient provided with contact information for the PCP's office or ED? Yes Was to pt encouraged to call back with questions or concerns? Yes

## 2020-12-25 NOTE — ED Provider Notes (Signed)
[facility] EMERGENCY DEPT Provider Note   CSN: [id number] Arrival date & time: 12/25/20  1810     History Chief Complaint  Patient presents with   Diarrhea    Elizabeth Warren is a 40 y.o. female who presents for evaluation of 3-day history of diarrhea.  Patient denies blood or mucus in the diarrhea.  Describes it as watery and having several episodes per day and per night.  She reports 3 episodes after arriving to the ED today.  She denies abdominal pain and cramping.  No known sick contacts.  No new foods.  She did have a cholecystectomy in February 2022 however has not had complications since then.  She has been taking loperamide without symptom relief.  No alleviating factors.  Patient denies all other symptoms including fever, chills, congestion, sore throat, chest pain, shortness of breath, nausea and vomiting, and headaches.  She also denies urinary symptoms including difficulty urinating and hematuria.   Diarrhea Associated symptoms: no abdominal pain, no fever, no headaches and no vomiting       Past Medical History:  Diagnosis Date   Acute cholecystitis 01/18/2020   Gestational diabetes    History of gestational diabetes 09/06/2016   Hypertension    pregnancy   NVD (normal vaginal delivery) 09/20/2010   Pre-existing type 2 diabetes mellitus during pregnancy in second trimester 10/02/2016    Patient Active Problem List   Diagnosis Date Noted   Left foot pain 11/01/2020   Chronic diarrhea 02/25/2020   Low TSH level 09/03/2019   Type 2 diabetes mellitus with hyperglycemia ([place]) 09/01/2019   Obesity (BMI 30-39.9) 10/31/2011    Past Surgical History:  Procedure Laterality Date   CHOLECYSTECTOMY N/A 01/19/2020   Procedure: LAPAROSCOPIC CHOLECYSTECTOMY;  Surgeon: Stark Klein, MD;  Location: [facility];  Service: General;  Laterality: N/A;   NO PAST SURGERIES       OB History     Gravida  5   Para  5   Term  5   Preterm  0   AB  0   Living  5       SAB  0   IAB  0   Ectopic  0   Multiple  0   Live Births  5           Family History  Problem Relation Age of Onset   Diabetes Mother    Hypertension Mother    Diabetes Father    Hypertension Father    Diabetes Maternal Grandmother    Hypertension Maternal Grandmother    Diabetes Maternal Grandfather    Hypertension Maternal Grandfather    Diabetes Paternal Grandmother    Hypertension Paternal Grandmother    Diabetes Paternal Grandfather    Hypertension Paternal Grandfather    Cancer Neg Hx    Heart disease Neg Hx    Stroke Neg Hx     Social History   Tobacco Use   Smoking status: Never   Smokeless tobacco: Never  Vaping Use   Vaping Use: Never used  Substance Use Topics   Alcohol use: No   Drug use: No    Home Medications Prior to Admission medications   Medication Sig Start Date End Date Taking? Authorizing Provider  diphenoxylate-atropine (LOMOTIL) 2.5-0.025 MG tablet Take 2 tablets by mouth 4 (four) times daily as needed for diarrhea or loose stools. 12/25/20  Yes Kathe Becton R, PA-C  Accu-Chek Softclix Lancets lancets Use as instructed 11/15/20   Eulis Foster, MD  benzonatate (TESSALON) 100 MG capsule Take 1 capsule (100 mg total) by mouth every 8 (eight) hours. 12/17/20   Regan Lemming, MD  Blood Glucose Monitoring Suppl (ACCU-CHEK GUIDE ME) w/Device KIT 1 Units by Does not apply route every other day. 09/03/19   Gifford Shave, MD  cephALEXin (KEFLEX) 500 MG capsule Take 1 capsule (500 mg total) by mouth 3 (three) times daily. 10/19/20   Raspet, Derry Skill, PA-C  cholestyramine (QUESTRAN) 4 g packet MIX AND DRINK 1 PACKET(4 GRAMS) BY MOUTH WITH BREAKFAST, LUNCH, AND EVENING MEAL 08/04/20   Simmons-Robinson, Makiera, MD  fluconazole (DIFLUCAN) 150 MG tablet Take 1 tablet (150 mg total) by mouth daily. Can take second dose 72 hours after first if symptoms persist. 10/19/20   Raspet, Junie Panning K, PA-C  glucose blood (ACCU-CHEK GUIDE) test strip  USE AS DIRECTED 11/15/20   Simmons-Robinson, Riki Sheer, MD  HYDROcodone-acetaminophen (NORCO/VICODIN) 5-325 MG tablet Take 2 tablets by mouth every 4 (four) hours as needed. 12/17/20   Regan Lemming, MD  insulin glargine (LANTUS) 100 UNIT/ML Solostar Pen Inject 16 Units into the skin daily. 11/15/20   Simmons-Robinson, Makiera, MD  loperamide (IMODIUM) 2 MG capsule TAKE 1 CAPSULE(2 MG) BY MOUTH DAILY AS NEEDED FOR DIARRHEA OR LOOSE STOOLS 11/15/20   Simmons-Robinson, Riki Sheer, MD  naproxen (NAPROSYN) 500 MG tablet Take 1 tablet (500 mg total) by mouth 2 (two) times daily with a meal. 10/19/20   Raspet, Erin K, PA-C  ondansetron (ZOFRAN ODT) 4 MG disintegrating tablet Take 1 tablet (4 mg total) by mouth every 8 (eight) hours as needed. 12/17/20   Regan Lemming, MD  oseltamivir (TAMIFLU) 75 MG capsule Take 1 capsule (75 mg total) by mouth every 12 (twelve) hours. 12/17/20   Regan Lemming, MD  traMADol Veatrice Bourbon) 50 MG tablet 1-2 tablets every 6 hours as needed for pain 12/21/20   Charlesetta Shanks, MD    Allergies    Patient has no known allergies.  Review of Systems   Review of Systems  Constitutional:  Negative for fever.  HENT: Negative.    Eyes: Negative.   Respiratory:  Negative for shortness of breath.   Cardiovascular: Negative.   Gastrointestinal:  Positive for diarrhea. Negative for abdominal pain and vomiting.  Endocrine: Negative.   Genitourinary: Negative.   Musculoskeletal: Negative.   Skin:  Negative for rash.  Neurological:  Negative for headaches.  All other systems reviewed and are negative.  Physical Exam Updated Vital Signs BP (!) 135/92   Pulse 88   Temp 98.7 F (37.1 C) (Oral)   Resp 16   Ht _0  (1.651 m)   Wt 91.7 kg   SpO2 100%   BMI 33.64 kg/m   Physical Exam Vitals and nursing note reviewed.  Constitutional:      General: She is not in acute distress.    Appearance: She is not ill-appearing.  HENT:     Head: Atraumatic.  Eyes:      Conjunctiva/sclera: Conjunctivae normal.  Cardiovascular:     Rate and Rhythm: Normal rate and regular rhythm.     Pulses: Normal pulses.     Heart sounds: No murmur heard. Pulmonary:     Effort: Pulmonary effort is normal. No respiratory distress.     Breath sounds: Normal breath sounds.  Abdominal:     General: Abdomen is flat. There is no distension.     Palpations: Abdomen is soft.     Tenderness: There is no abdominal tenderness.  Musculoskeletal:  General: Normal range of motion.     Cervical back: Normal range of motion.  Skin:    General: Skin is warm and dry.     Capillary Refill: Capillary refill takes less than 2 seconds.  Neurological:     General: No focal deficit present.     Mental Status: She is alert.  Psychiatric:        Mood and Affect: Mood normal.    ED Results / Procedures / Treatments   Labs (all labs ordered are listed, but only abnormal results are displayed) Labs Reviewed  COMPREHENSIVE METABOLIC PANEL - Abnormal; Notable for the following components:      Result Value   Glucose, Bld 161 (*)    Total Protein 8.2 (*)    Total Bilirubin 0.2 (*)    All other components within normal limits  URINALYSIS, ROUTINE W REFLEX MICROSCOPIC - Abnormal; Notable for the following components:   Color, Urine COLORLESS (*)    All other components within normal limits  LIPASE, BLOOD  CBC  PREGNANCY, URINE    EKG None  Radiology No results found.  Procedures Procedures   Medications Ordered in ED Medications  diphenoxylate-atropine (LOMOTIL) 2.5-0.025 MG per tablet 2 tablet (2 tablets Oral Given 12/25/20 2114)    ED Course  I have reviewed the triage vital signs and the nursing notes.  Pertinent labs & imaging results that were available during my care of the patient were reviewed by me and considered in my medical decision making (see chart for details).    MDM Rules/Calculators/A&P                         This is a 40 year old female  presents for evaluation of 3-day history of diarrhea.  CMP UA and CBC were unremarkable.  Lipase was normal.  Patient is not pregnant.  Physical exam was benign with no abdominal tenderness or distention.  Her vitals were stable here in the ED.  Symptoms are not really consistent with an acute GI emergencies such as pancreatitis, SBO, or infectious GI disease.  I attempted to gather a C. difficile and GI panel given the frequency of patient's diarrhea episodes, however patient insists that she needs to get home to take care of her young child.  She requests an antidiarrheal different from loperamide and to be discharged.  Discussed with patient that if she does have C. difficile, the medication I prescribe could cause serious complications.  Patient understands and wishes to be discharged home with an antidiarrheal regardless.  Gave her 1 dose of Lomotil here in the ED with a prescription sent to her nearest pharmacy.  Patient amenable to plan, discharged home in good condition. Final Clinical Impression(s) / ED Diagnoses Final diagnoses:  Diarrhea, unspecified type    Rx / DC Orders ED Discharge Orders          Ordered    diphenoxylate-atropine (LOMOTIL) 2.5-0.025 MG tablet  4 times daily PRN        12/25/20 2108             Tonye Pearson, PA-C 12/25/20 2308    Sherwood Gambler, MD 12/25/20 [phone]

## 2020-12-25 NOTE — ED Triage Notes (Signed)
Patient here POV from Home with Diarrhea.   Patient states she has been having Watery Stool for approximately 3 days that has persisted. Mild Cramping prior to Diarrhea.   No Fevers, NAD Noted during Triage, A&Ox4. GCS 15. Ambulatory.

## 2020-12-25 NOTE — Discharge Instructions (Signed)
I have sent you in a prescription for Lomotil, which is a different kind of diarrhea medication than the one that you are currently taking. Please take as directed and feel better soon. Remember to drink plenty of fluids and stay hydrated.

## 2020-12-26 ENCOUNTER — Telehealth: Payer: Self-pay

## 2020-12-26 NOTE — Telephone Encounter (Signed)
Transition Care Management Follow-up Telephone Call Date of discharge and from where: 12/25/2020 from [facility] How have you been since you were released from the hospital? Pt states that she is feeling better and did not have any questions or concerns at this time. Pt informed to follow up with PCP if symptoms persisted or returned.  Any questions or concerns? No  Items Reviewed: Did the pt receive and understand the discharge instructions provided? Yes  Medications obtained and verified? Yes  Other? No  Any new allergies since your discharge? No  Dietary orders reviewed? No Do you have support at home? Yes   Functional Questionnaire: (I = Independent and D = Dependent) ADLs: I  Bathing/Dressing- I  Meal Prep- I  Eating- I  Maintaining continence- I  Transferring/Ambulation- I  Managing Meds- I   Follow up appointments reviewed:  PCP Hospital f/u appt confirmed? No   Specialist Hospital f/u appt confirmed? No   Are transportation arrangements needed? No  If their condition worsens, is the pt aware to call PCP or go to the Emergency Dept.? Yes Was the patient provided with contact information for the PCP's office or ED? Yes Was to pt encouraged to call back with questions or concerns? Yes

## 2020-12-27 ENCOUNTER — Other Ambulatory Visit: Payer: Self-pay

## 2020-12-27 DIAGNOSIS — E119 Type 2 diabetes mellitus without complications: Secondary | ICD-10-CM | POA: Insufficient documentation

## 2020-12-27 DIAGNOSIS — Z794 Long term (current) use of insulin: Secondary | ICD-10-CM | POA: Diagnosis not present

## 2020-12-27 DIAGNOSIS — R197 Diarrhea, unspecified: Secondary | ICD-10-CM | POA: Diagnosis not present

## 2020-12-27 DIAGNOSIS — I10 Essential (primary) hypertension: Secondary | ICD-10-CM | POA: Diagnosis not present

## 2020-12-27 DIAGNOSIS — Z79899 Other long term (current) drug therapy: Secondary | ICD-10-CM | POA: Insufficient documentation

## 2020-12-28 ENCOUNTER — Other Ambulatory Visit: Payer: Self-pay

## 2020-12-28 ENCOUNTER — Other Ambulatory Visit: Payer: Self-pay | Admitting: Family Medicine

## 2020-12-28 ENCOUNTER — Encounter (HOSPITAL_BASED_OUTPATIENT_CLINIC_OR_DEPARTMENT_OTHER): Payer: Self-pay | Admitting: Emergency Medicine

## 2020-12-28 ENCOUNTER — Emergency Department (HOSPITAL_BASED_OUTPATIENT_CLINIC_OR_DEPARTMENT_OTHER)
Admission: EM | Admit: 2020-12-28 | Discharge: 2020-12-28 | Disposition: A | Discharge status: Home / Self Care | Payer: Medicaid Other | Attending: Emergency Medicine | Admitting: Emergency Medicine

## 2020-12-28 DIAGNOSIS — R197 Diarrhea, unspecified: Secondary | ICD-10-CM

## 2020-12-28 LAB — BASIC METABOLIC PANEL
Anion gap: 9 (ref 5–15)
BUN: 13 mg/dL (ref 6–20)
CO2: 25 mmol/L (ref 22–32)
Calcium: 9.5 mg/dL (ref 8.9–10.3)
Chloride: 98 mmol/L (ref 98–111)
Creatinine, Ser: 0.59 mg/dL (ref 0.44–1.00)
GFR, Estimated: >60 mL/min (ref >60)
Glucose, Bld: 132 mg/dL — ABNORMAL HIGH (ref 70–99)
Potassium: 3.4 mmol/L — ABNORMAL LOW (ref 3.5–5.1)
Sodium: 132 mmol/L — ABNORMAL LOW (ref 135–145)

## 2020-12-28 LAB — CBC WITH DIFFERENTIAL/PLATELET
Abs Immature Granulocytes: 0.03 10*3/uL (ref 0.00–0.07)
Basophils Absolute: 0.1 10*3/uL (ref 0.0–0.1)
Basophils Relative: 1 %
Eosinophils Absolute: 1.2 10*3/uL — ABNORMAL HIGH (ref 0.0–0.5)
Eosinophils Relative: 11 %
HCT: 38.1 % (ref 36.0–46.0)
Hemoglobin: 12.3 g/dL (ref 12.0–15.0)
Immature Granulocytes: 0 %
Lymphocytes Relative: 30 %
Lymphs Abs: 3.3 10*3/uL (ref 0.7–4.0)
MCH: 26.9 pg (ref 26.0–34.0)
MCHC: 32.3 g/dL (ref 30.0–36.0)
MCV: 83.4 fL (ref 80.0–100.0)
Monocytes Absolute: 0.7 10*3/uL (ref 0.1–1.0)
Monocytes Relative: 7 %
Neutro Abs: 5.7 10*3/uL (ref 1.7–7.7)
Neutrophils Relative %: 51 %
Platelets: 339 10*3/uL (ref 150–400)
RBC: 4.57 MIL/uL (ref 3.87–5.11)
RDW: 13 % (ref 11.5–15.5)
WBC: 11 10*3/uL — ABNORMAL HIGH (ref 4.0–10.5)
nRBC: 0 % (ref 0.0–0.2)

## 2020-12-28 LAB — C DIFFICILE QUICK SCREEN W PCR REFLEX
C Diff antigen: POSITIVE — AB
C Diff toxin: NEGATIVE

## 2020-12-28 MED ORDER — DIPHENOXYLATE-ATROPINE 2.5-0.025 MG PO TABS
1.0000 | ORAL_TABLET | Freq: Once | ORAL | Status: AC
  Administered 2020-12-28: 1 via ORAL
  Filled 2020-12-28: qty 1

## 2020-12-28 MED ORDER — DIPHENOXYLATE-ATROPINE 2.5-0.025 MG PO TABS: 2.0000 | ORAL_TABLET | Freq: Four times a day (QID) | ORAL | 0 refills | Status: DC | PRN

## 2020-12-28 NOTE — ED Provider Notes (Signed)
[facility] EMERGENCY DEPT Provider Note   CSN: [id number] Arrival date & time: 12/27/20  2354     History Chief Complaint  Patient presents with   Diarrhea    Elizabeth Warren is a 40 y.o. female.  The history is provided by the patient and a relative.  Diarrhea Quality:  Watery Severity:  Moderate Onset quality:  Gradual Timing:  Intermittent Progression:  Worsening Relieved by:  Nothing Worsened by:  Nothing Associated symptoms: no abdominal pain, no fever and no vomiting   Risk factors: recent antibiotic use   Risk factors: no travel to endemic areas   Patient with history of diabetes, obesity presents with continued diarrhea Patient was seen on November 21 for 3-day history of diarrhea.  The symptoms have continued despite taking medications.  No fevers or vomiting.  No recent travel.  No significant abdominal pain. She does not get this frequently.     Past Medical History:  Diagnosis Date   Acute cholecystitis 01/18/2020   Gestational diabetes    History of gestational diabetes 09/06/2016   Hypertension    pregnancy   NVD (normal vaginal delivery) 09/20/2010   Pre-existing type 2 diabetes mellitus during pregnancy in second trimester 10/02/2016    Patient Active Problem List   Diagnosis Date Noted   Left foot pain 11/01/2020   Chronic diarrhea 02/25/2020   Low TSH level 09/03/2019   Type 2 diabetes mellitus with hyperglycemia ([place]) 09/01/2019   Obesity (BMI 30-39.9) 10/31/2011    Past Surgical History:  Procedure Laterality Date   CHOLECYSTECTOMY N/A 01/19/2020   Procedure: LAPAROSCOPIC CHOLECYSTECTOMY;  Surgeon: Stark Klein, MD;  Location: [facility];  Service: General;  Laterality: N/A;   NO PAST SURGERIES       OB History     Gravida  5   Para  5   Term  5   Preterm  0   AB  0   Living  5      SAB  0   IAB  0   Ectopic  0   Multiple  0   Live Births  5           Family History  Problem Relation Age of Onset    Diabetes Mother    Hypertension Mother    Diabetes Father    Hypertension Father    Diabetes Maternal Grandmother    Hypertension Maternal Grandmother    Diabetes Maternal Grandfather    Hypertension Maternal Grandfather    Diabetes Paternal Grandmother    Hypertension Paternal Grandmother    Diabetes Paternal Grandfather    Hypertension Paternal Grandfather    Cancer Neg Hx    Heart disease Neg Hx    Stroke Neg Hx     Social History   Tobacco Use   Smoking status: Never   Smokeless tobacco: Never  Vaping Use   Vaping Use: Never used  Substance Use Topics   Alcohol use: No   Drug use: No    Home Medications Prior to Admission medications   Medication Sig Start Date End Date Taking? Authorizing Provider  Accu-Chek Softclix Lancets lancets Use as instructed 11/15/20   Simmons-Robinson, Makiera, MD  Blood Glucose Monitoring Suppl (ACCU-CHEK GUIDE ME) w/Device KIT 1 Units by Does not apply route every other day. 09/03/19   Gifford Shave, MD  cholestyramine (QUESTRAN) 4 g packet MIX AND DRINK 1 PACKET(4 GRAMS) BY MOUTH WITH BREAKFAST, LUNCH, AND EVENING MEAL 08/04/20   Simmons-Robinson, Riki Sheer, MD  diphenoxylate-atropine (  LOMOTIL) 2.5-0.025 MG tablet Take 2 tablets by mouth 4 (four) times daily as needed for diarrhea or loose stools. 12/25/20   Tonye Pearson, PA-C  fluconazole (DIFLUCAN) 150 MG tablet Take 1 tablet (150 mg total) by mouth daily. Can take second dose 72 hours after first if symptoms persist. 10/19/20   Raspet, Junie Panning K, PA-C  glucose blood (ACCU-CHEK GUIDE) test strip USE AS DIRECTED 11/15/20   Simmons-Robinson, Riki Sheer, MD  insulin glargine (LANTUS) 100 UNIT/ML Solostar Pen Inject 16 Units into the skin daily. 11/15/20   Simmons-Robinson, Makiera, MD  loperamide (IMODIUM) 2 MG capsule TAKE 1 CAPSULE(2 MG) BY MOUTH DAILY AS NEEDED FOR DIARRHEA OR LOOSE STOOLS 11/15/20   Simmons-Robinson, Riki Sheer, MD    Allergies    Patient has no known allergies.  Review of  Systems   Review of Systems  Constitutional:  Negative for fever.  Respiratory:  Negative for cough.   Gastrointestinal:  Positive for diarrhea. Negative for abdominal pain, blood in stool and vomiting.  Genitourinary:  Negative for dysuria.  All other systems reviewed and are negative.  Physical Exam Updated Vital Signs BP (!) 120/96 (BP Location: Right Arm)   Pulse 95   Temp 98.2 F (36.8 C) (Oral)   Resp 18   Ht 1.651 m (5' 5" )   Wt 93.4 kg   LMP 12/19/2020   SpO2 99%   BMI 34.27 kg/m   Physical Exam CONSTITUTIONAL: Well developed/well nourished, no distress HEAD: Normocephalic/atraumatic EYES: EOMI ENMT: Mucous membranes moist NECK: supple no meningeal signs CV: S1/S2 noted, no murmurs/rubs/gallops noted LUNGS: Lungs are clear to auscultation bilaterally, no apparent distress ABDOMEN: soft, nontender NEURO: Pt is awake/alert/appropriate, moves all extremitiesx4.  No facial droop.   EXTREMITIES: full ROM SKIN: warm, color normal PSYCH: Anxious ED Results / Procedures / Treatments   Labs (all labs ordered are listed, but only abnormal results are displayed) Labs Reviewed  BASIC METABOLIC PANEL - Abnormal; Notable for the following components:      Result Value   Sodium 132 (*)    Potassium 3.4 (*)    Glucose, Bld 132 (*)    All other components within normal limits  CBC WITH DIFFERENTIAL/PLATELET - Abnormal; Notable for the following components:   WBC 11.0 (*)    Eosinophils Absolute 1.2 (*)    All other components within normal limits  GASTROINTESTINAL PANEL BY PCR, STOOL (REPLACES STOOL CULTURE)  C DIFFICILE QUICK SCREEN W PCR REFLEX      EKG None  Radiology No results found.  Procedures Procedures   Medications Ordered in ED Medications - No data to display  ED Course  I have reviewed the triage vital signs and the nursing notes.  Pertinent labs results that were available during my care of the patient were reviewed by me and considered in my  medical decision making (see chart for details).    MDM Rules/Calculators/A&P                           Patient presents for seventh ER visit in 6 months She has already been seen for this condition.  She has been taking Lomotil with minimal relief.  When I review her chart, patient has been on multiple antibiotics recently including clindamycin Suspect this may represent C. difficile.  C. difficile and GI panel are pending Advised patient to follow-up with MyChart, as she may need antibiotics if these are positive Advised patient follow with her PCP as  it appears she is established with the family medicine center Patient is overall well-appearing, nontoxic and well-appearing no focal abdominal tenderness Final Clinical Impression(s) / ED Diagnoses Final diagnoses:  None    Rx / DC Orders ED Discharge Orders     None        Ripley Fraise, MD 12/28/20 [phone]

## 2020-12-28 NOTE — ED Triage Notes (Signed)
Reports diarrhea for the last week.  Seen here previously.  Reports it has not gotten any better.  Taking anti diarrheal with no relief.  Denies pain currently.  Reports cramping when she needs to go  to the bathroom.

## 2020-12-29 ENCOUNTER — Telehealth (HOSPITAL_BASED_OUTPATIENT_CLINIC_OR_DEPARTMENT_OTHER): Payer: Self-pay | Admitting: Emergency Medicine

## 2020-12-29 ENCOUNTER — Telehealth: Payer: Self-pay

## 2020-12-29 LAB — GASTROINTESTINAL PANEL BY PCR, STOOL (REPLACES STOOL CULTURE)

## 2020-12-29 LAB — CLOSTRIDIUM DIFFICILE BY PCR, REFLEXED: Toxigenic C. Difficile by PCR: POSITIVE — AB

## 2020-12-29 MED ORDER — VANCOMYCIN HCL 125 MG PO CAPS: 125.0000 mg | ORAL_CAPSULE | Freq: Four times a day (QID) | ORAL | 0 refills | Status: DC

## 2020-12-29 NOTE — Telephone Encounter (Signed)
Stool specimen has resulted positive for C. difficile.  Chart reviewed.  Patient is symptomatic.  She has had recent antibiotic treatment.  Will treat with vancomycin 125 mg 4 times daily for 10 days.  Also positive for enteropathogenic E. coli.  No specific treatment needed.  Patient was clinically well in appearance at time of evaluation by review of EMR.

## 2020-12-29 NOTE — ED Notes (Signed)
Dr Broadus John aware of Entro Pathogen E coli in stool.

## 2020-12-29 NOTE — Telephone Encounter (Signed)
Transition Care Management Follow-up Telephone Call Date of discharge and from where: 12/28/2020-[facility] How have you been since you were released from the hospital? Patient stated she is ok but she has diarrhea. She will schedule an appointment with her PCP on Monday.  Any questions or concerns? No  Items Reviewed: Did the pt receive and understand the discharge instructions provided? Yes  Medications obtained and verified? Yes  Other? No  Any new allergies since your discharge? No  Dietary orders reviewed? No Do you have support at home? Yes   Home Care and Equipment/Supplies: Were home health services ordered? not applicable If so, what is the name of the agency? N/A  Has the agency set up a time to come to the patient's home? not applicable Were any new equipment or medical supplies ordered?  No What is the name of the medical supply agency? N/A Were you able to get the supplies/equipment? not applicable Do you have any questions related to the use of the equipment or supplies? No  Functional Questionnaire: (I = Independent and D = Dependent) ADLs: I  Bathing/Dressing- I  Meal Prep- I  Eating- I  Maintaining continence- I  Transferring/Ambulation- I  Managing Meds- I  Follow up appointments reviewed:  PCP Hospital f/u appt confirmed? No   Specialist Hospital f/u appt confirmed? No   Are transportation arrangements needed? No  If their condition worsens, is the pt aware to call PCP or go to the Emergency Dept.? Yes Was the patient provided with contact information for the PCP's office or ED? Yes Was to pt encouraged to call back with questions or concerns? Yes

## 2020-12-29 NOTE — ED Notes (Signed)
Spoke with pt and daughter to make aware of C Diff, they are aware a prescription is waiting at [facility] on [address] to be picked up and started, stressed the need to complete all of the prescription, Also went over cleanliness and how to decrease the spread of C Diff. Verbalized understanding.

## 2020-12-29 NOTE — ED Notes (Signed)
Opened chart to record lab results

## 2021-01-05 ENCOUNTER — Other Ambulatory Visit: Payer: Self-pay | Admitting: Family Medicine

## 2021-01-15 ENCOUNTER — Other Ambulatory Visit: Payer: Self-pay

## 2021-01-15 ENCOUNTER — Ambulatory Visit (INDEPENDENT_AMBULATORY_CARE_PROVIDER_SITE_OTHER): Payer: Medicaid Other | Admitting: Family Medicine

## 2021-01-15 ENCOUNTER — Telehealth: Payer: Self-pay

## 2021-01-15 VITALS — BP 124/97 | HR 101 | Ht 65.0 in | Wt 207.8 lb

## 2021-01-15 DIAGNOSIS — A0472 Enterocolitis due to Clostridium difficile, not specified as recurrent: Secondary | ICD-10-CM | POA: Diagnosis not present

## 2021-01-15 MED ORDER — MULTIVITAMINS PO CAPS: 1.0000 | ORAL_CAPSULE | Freq: Every day | ORAL | 0 refills | Status: DC

## 2021-01-15 NOTE — Patient Instructions (Addendum)
Thank you for coming to see me today. It was a pleasure.   Continue Vancomycin 4 times day  Please follow-up with PCP as needed  If you have any questions or concerns, please do not hesitate to call the office at [phone].  Best,   Dana Allan, MD    Clostridioides Difficile Infection Clostridioides difficile infection, or C. diff, is an infection that is caused by C. diff germs (bacteria). This infection may happen after you take antibiotics that kill other germs and let C. diff germs grow. C. diff can be spread from person to person (is contagious). What are the causes? Taking certain antibiotics. Coming in contact with people, food, or things that have C. diff. What increases the risk? Taking certain antibiotics for a long time. Staying in a hospital or long-term care facility for a long time. Being age 31 or older. Having had C. diff before or been exposed to C. diff. Having a weak disease-fighting system (immune system). Taking medicines that treat stomach acid. Having serious health problems, including: Colon cancer. Inflammatory bowel disease (IBD). Having had a procedure or surgery on your digestive system. What are the signs or symptoms? Watery poop (diarrhea). Fever. Not feeling hungry. Feeling like you may vomit. Swelling, pain, cramps, or a tender belly. How is this treated? Treatment may include: Stopping the antibiotics that caused the C. diff infection. Taking antibiotics that kill C. diff. Placing poop from a healthy person into your colon (fecal transplant). Doing surgery to take out the infected part of the colon. Follow these instructions at home: Medicines Take over-the-counter and prescription medicines only as told by your doctor. Take antibiotic medicine as told by your doctor. Do not stop taking it even if you start to feel better. Do not take medicines to treat watery poop unless your doctor tells you to. Eating and drinking  Follow  instructions from your doctor about what to eat and drink. This may include eating bland foods in small amounts, such as: Bananas. Applesauce. Rice. Lean meats. Toast. Crackers. To prevent loss of fluid in your body (dehydration): Take in enough fluids to keep your pee pale yellow. This includes water, ice chips, clear fruit juice with water added to it, or low-calorie sports drinks. Take an ORS (oral rehydration solution). This drink is sold in pharmacies and retail stores. Avoid milk, caffeine, and alcohol. General instructions Wash your hands often with soap and water. Do this for at least 20 seconds. Take a bath or shower every day. Return to your normal activities when your doctor says that it is safe. Keep all follow-up visits. How is this prevented? Personal hygiene  Wash your hands often with soap and water. Do this for at least 20 seconds. Wash your hands before you cook and after you use the bathroom. Other people should wash their hands too, especially: People who live with you. People who visit you in a hospital or clinic. Contact precautions If you get watery poop while you are in the hospital or a long-term care facility, tell your doctor right away. When you visit someone in the hospital or a long-term care facility, wear a gown, gloves, or other protection. If possible: Stay away from people who have diarrhea. Use a separate bathroom if you are sick and live with other people. Clean environment Keep your home clean. Clean your home every day for at least a week after you leave the hospital. Clean surfaces that you touch every day. Use a product that has  a 10% chlorine bleach solution. Be sure to: Read the label on your product to make sure that the product will kill the germs on your surfaces. Clean toilets and flush handles, bathtubs, sinks, doorknobs and handles, countertops, and work surfaces. If you are in the hospital, make sure the surfaces in your room are  cleaned each day. Tell someone right away if body fluids have splashed or spilled. Clothes and linens Wash clothes and linens using laundry soap that has chlorine bleach. Be sure to: Use powder soap instead of liquid. Clean your washing machine once a month. To do this, turn on the hot setting with only soap in it. Contact a doctor if: Your symptoms do not get better or they get worse. Your symptoms go away and then come back. You have a fever. You have new symptoms. Get help right away if: Your belly is more tender or you have more pain. Your poop is mostly bloody. Your poop looks black. You vomit after you eat or drink. You have signs of not having enough fluids in your body. These include: Dark yellow pee, very little pee, or no pee. Cracked lips or dry mouth. No tears when you cry. Sunken eyes. Feeling sleepy. Feeling weak or dizzy. Summary C. diff infection is an infection that may happen after you take antibiotic medicines. Symptoms include watery poop, fever, not feeling hungry, or feeling like you may vomit. Treatment includes stopping the antibiotics that made you sick and taking antibiotics that kill the C. diff germs. Poop from a healthy person may also be placed into your colon. To prevent C. diff infectionfrom spreading, wash hands often with soap and water. Do this for at least 20 seconds. Keep your home clean. This information is not intended to replace advice given to you by your health care provider. Make sure you discuss any questions you have with your health care provider. Document Revised: 05/13/2019 Document Reviewed: 05/13/2019 Elsevier Patient Education  [employer].

## 2021-01-15 NOTE — Telephone Encounter (Signed)
Patient calls nurse line reporting she was in this morning and was under the impression she would be getting two medications. Patient reports she thought the provider was going to refill Vancomycin (she is out as of today) and send in something for potassium.   Will forward to provider who saw patient.

## 2021-01-15 NOTE — Progress Notes (Signed)
    SUBJECTIVE:   CHIEF COMPLAINT / HPI: diarrhea  Reports history of diarrhea since gallbladder surgery but was manageable with Imodium.  Diarrhea stated to increase about 1 month ago.  Went to ED 11/21 and work up at that time negative.  Given Lomotil and Toradol prior to discharge home in stable condition.  She returned to ED 11/24 with similar complaints and GIPP positive for C. Diff. She was started on Vancomycin 125 mg QID x 10 days on 11/25.   Patient continues to have 10-12 episodes of non bloody diarrhea daily. Denies any abdominal pain or fevers.  Has cramping when taking in fluids or meals.  Denies any weight loss.  She reports having taking Imodium a couple of times to help with diarrhea, but has not been effective.  She is requesting the same medication that she received in ED to stop diarrhea.    Completed course of Cephalexin in 09/22 and Clindamycin 11/13  PERTINENT  PMH / PSH:  Chronic Diarrhea Recent Antibiotic use   OBJECTIVE:   BP (!) 124/97   Pulse (!) 101   Ht 5\' 5"  (1.651 m)   Wt 207 lb 12.8 oz (94.3 kg)   LMP 12/19/2020   SpO2 100%   BMI 34.58 kg/m    General: Alert, no acute distress but anxious  Cardio: Normal S1 and S2, RRR, no r/m/g Pulm: CTAB, normal work of breathing Abdomen: Bowel sounds normal. Abdomen soft and non-tender.     ASSESSMENT/PLAN:   C. difficile diarrhea Mild C. Diff infection not improving with 10 days of Vancomycin therapy.  Abdominal exam benign. Given no improvement will continue Vancomycin 125 mg QID for an additional 4 days, for total of 14 days  CBC, CMet today Refer to ID for further evaluation     12/21/2020, MD [facility]

## 2021-01-16 ENCOUNTER — Telehealth: Payer: Self-pay

## 2021-01-16 ENCOUNTER — Encounter: Payer: Self-pay | Admitting: Family Medicine

## 2021-01-16 DIAGNOSIS — A0472 Enterocolitis due to Clostridium difficile, not specified as recurrent: Secondary | ICD-10-CM | POA: Insufficient documentation

## 2021-01-16 LAB — COMPREHENSIVE METABOLIC PANEL
ALT: 26 IU/L (ref 0–32)
AST: 18 IU/L (ref 0–40)
Albumin/Globulin Ratio: 1.3 (ref 1.2–2.2)
Albumin: 4.1 g/dL (ref 3.8–4.8)
Alkaline Phosphatase: 82 IU/L (ref 44–121)
BUN/Creatinine Ratio: 16 (ref 9–23)
BUN: 8 mg/dL (ref 6–20)
Bilirubin Total: <0.2 mg/dL (ref 0.0–1.2)
CO2: 21 mmol/L (ref 20–29)
Calcium: 9.4 mg/dL (ref 8.7–10.2)
Chloride: 100 mmol/L (ref 96–106)
Creatinine, Ser: 0.5 mg/dL — ABNORMAL LOW (ref 0.57–1.00)
Globulin, Total: 3.1 g/dL (ref 1.5–4.5)
Glucose: 218 mg/dL — ABNORMAL HIGH (ref 70–99)
Potassium: 4.3 mmol/L (ref 3.5–5.2)
Sodium: 137 mmol/L (ref 134–144)
Total Protein: 7.2 g/dL (ref 6.0–8.5)
eGFR: 123 mL/min/{1.73_m2} (ref >59)

## 2021-01-16 LAB — CBC WITH DIFFERENTIAL/PLATELET
Basophils Absolute: 0.1 10*3/uL (ref 0.0–0.2)
Basos: 1 %
EOS (ABSOLUTE): 1 10*3/uL — ABNORMAL HIGH (ref 0.0–0.4)
Eos: 11 %
Hematocrit: 38.8 % (ref 34.0–46.6)
Hemoglobin: 12.9 g/dL (ref 11.1–15.9)
Immature Grans (Abs): 0 10*3/uL (ref 0.0–0.1)
Immature Granulocytes: 0 %
Lymphocytes Absolute: 2.6 10*3/uL (ref 0.7–3.1)
Lymphs: 28 %
MCH: 27.7 pg (ref 26.6–33.0)
MCHC: 33.2 g/dL (ref 31.5–35.7)
MCV: 83 fL (ref 79–97)
Monocytes Absolute: 0.5 10*3/uL (ref 0.1–0.9)
Monocytes: 6 %
Neutrophils Absolute: 5.1 10*3/uL (ref 1.4–7.0)
Neutrophils: 54 %
Platelets: 326 10*3/uL (ref 150–450)
RBC: 4.65 x10E6/uL (ref 3.77–5.28)
RDW: 13.2 % (ref 11.7–15.4)
WBC: 9.3 10*3/uL (ref 3.4–10.8)

## 2021-01-16 MED ORDER — VANCOMYCIN HCL 125 MG PO CAPS: 125.0000 mg | ORAL_CAPSULE | Freq: Four times a day (QID) | ORAL | 0 refills | Status: DC

## 2021-01-16 NOTE — Telephone Encounter (Signed)
I need the lab results before ordering medications.  Have sent in a 4 day supply for Vancomycin as well as multivitamins.  Have sent a referral to infectious disease, she needs an appointment with them.    Thank you Dana Allan, MD [facility]

## 2021-01-16 NOTE — Telephone Encounter (Signed)
Patient called requesting earlier appointment as she is experiencing a lot of diarrhea. Relayed that Dr. Clinton Gallant schedule is full today and that we are seeing her tomorrow for evaluation of C.diff. Recommended she stay hydrated and that she may need to be seen at urgent care/ED if her symptoms are severe. Patient verbalized understanding and has no further questions.   Sandie Ano, RN

## 2021-01-16 NOTE — Assessment & Plan Note (Signed)
Mild C. Diff infection not improving with 10 days of Vancomycin therapy.  Abdominal exam benign. Given no improvement will continue Vancomycin 125 mg QID for an additional 4 days, for total of 14 days  CBC, CMet today Refer to ID for further evaluation

## 2021-01-17 ENCOUNTER — Telehealth: Payer: Self-pay

## 2021-01-17 ENCOUNTER — Ambulatory Visit (INDEPENDENT_AMBULATORY_CARE_PROVIDER_SITE_OTHER): Payer: Medicaid Other | Admitting: Infectious Disease

## 2021-01-17 ENCOUNTER — Encounter: Payer: Self-pay | Admitting: Infectious Disease

## 2021-01-17 ENCOUNTER — Other Ambulatory Visit: Payer: Self-pay

## 2021-01-17 ENCOUNTER — Other Ambulatory Visit (HOSPITAL_COMMUNITY): Payer: Self-pay

## 2021-01-17 VITALS — BP 135/93 | HR 98 | Temp 96.1°F | Resp 16 | Ht 65.0 in | Wt 204.6 lb

## 2021-01-17 DIAGNOSIS — E1165 Type 2 diabetes mellitus with hyperglycemia: Secondary | ICD-10-CM

## 2021-01-17 DIAGNOSIS — A0472 Enterocolitis due to Clostridium difficile, not specified as recurrent: Secondary | ICD-10-CM | POA: Diagnosis not present

## 2021-01-17 DIAGNOSIS — Z7185 Encounter for immunization safety counseling: Secondary | ICD-10-CM | POA: Diagnosis not present

## 2021-01-17 DIAGNOSIS — Z9049 Acquired absence of other specified parts of digestive tract: Secondary | ICD-10-CM | POA: Insufficient documentation

## 2021-01-17 DIAGNOSIS — K580 Irritable bowel syndrome with diarrhea: Secondary | ICD-10-CM | POA: Diagnosis not present

## 2021-01-17 HISTORY — DX: Acquired absence of other specified parts of digestive tract: Z90.49

## 2021-01-17 MED ORDER — DIFICID 200 MG PO TABS
200.0000 mg | ORAL_TABLET | Freq: Two times a day (BID) | ORAL | 1 refills | Status: DC
  Filled 2021-01-17: qty 20, 10d supply, fill #0
  Filled 2021-01-25: qty 20, 10d supply, fill #1

## 2021-01-17 NOTE — Telephone Encounter (Signed)
[facility] Patient Advocate Encounter  Prior Authorization for Dificid has been approved.    PA# [id number] Effective dates: 01/17/21 through 01/17/22  Patients co-pay is $4.00.   [facility] will continue to follow.  Clearance Coots, CPhT Specialty Pharmacy Patient [facility] Phone: [phone] Fax:  [phone]

## 2021-01-17 NOTE — Progress Notes (Signed)
Reason for infectious disease consult:Refractory C. difficile colitis  Requesting Physician: Eulis Foster, MD  Subjective:    Patient ID: Elizabeth Warren, female    DOB: 1980-06-28, 40 y.o.   MRN: [id number]  HPI  Elizabeth Warren is a very pleasant 40 year old woman originally from [place] who has diabetes mellitus and a history of cholecystitis status postcholecystectomy.  She did have some trouble with loose stools post cholecystectomy and does carry a diagnosis of irritable bowel syndrome in her problem list.  She apparently had been having loose stools for many months but they were quite severe over the last month with her having up to 12 bowel movements per day.  She tells me that she had influenza in the past 2 months.  After that she had he tells dental work and was given some antibiotics according to the ER note this was clindamycin.  Afterwards she began having frequent bowel movements up to 12 times a day.  She was seen ultimately on 24 November where C. difficile testing was antigen positive toxin negative but PCR positive.  She was initiated on oral vancomycin at that point in time but has had no improvement in her symptoms still having 10-12 bowel movements per day.  She says that she has become so dehydrated and that her potassium had become low.  She currently is on vancomycin but says it is not helping she is at 125 4 times a day.  She is not known to of had C. difficile colitis in the past.  She is not appear to be on a proton pump inhibitor or other chronic antibiotic.     Past Medical History:  Diagnosis Date   Acute cholecystitis 01/18/2020   Gestational diabetes    History of gestational diabetes 09/06/2016   Hypertension    pregnancy   NVD (normal vaginal delivery) 09/20/2010   Pre-existing type 2 diabetes mellitus during pregnancy in second trimester 10/02/2016    Past Surgical History:  Procedure Laterality Date   CHOLECYSTECTOMY N/A 01/19/2020    Procedure: LAPAROSCOPIC CHOLECYSTECTOMY;  Surgeon: Stark Klein, MD;  Location: [facility];  Service: General;  Laterality: N/A;   NO PAST SURGERIES      Family History  Problem Relation Age of Onset   Diabetes Mother    Hypertension Mother    Diabetes Father    Hypertension Father    Diabetes Maternal Grandmother    Hypertension Maternal Grandmother    Diabetes Maternal Grandfather    Hypertension Maternal Grandfather    Diabetes Paternal Grandmother    Hypertension Paternal Grandmother    Diabetes Paternal Grandfather    Hypertension Paternal Grandfather    Cancer Neg Hx    Heart disease Neg Hx    Stroke Neg Hx       Social History   Socioeconomic History   Marital status: Married    Spouse name: Not on file   Number of children: Not on file   Years of education: Not on file   Highest education level: Not on file  Occupational History   Not on file  Tobacco Use   Smoking status: Never   Smokeless tobacco: Never  Vaping Use   Vaping Use: Never used  Substance and Sexual Activity   Alcohol use: No   Drug use: No   Sexual activity: Yes    Birth control/protection: I.U.D.  Other Topics Concern   Not on file  Social History Narrative   Not on file   Social Determinants of Health  Financial Resource Strain: Not on file  Food Insecurity: Not on file  Transportation Needs: Not on file  Physical Activity: Not on file  Stress: Not on file  Social Connections: Not on file    No Known Allergies   Current Outpatient Medications:    Accu-Chek Softclix Lancets lancets, Use as instructed, Disp: 100 each, Rfl: 12   Blood Glucose Monitoring Suppl (ACCU-CHEK GUIDE ME) w/Device KIT, 1 Units by Does not apply route every other day., Disp: 1 kit, Rfl: 0   cholestyramine (QUESTRAN) 4 g packet, DISSOLVE CONTENTS OF 1 PACKET IN LIQUID AND DRINK BY MOUTH WITH BREAKFAST, LUNCH, AND EVENING MEAL, Disp: 30 each, Rfl: 1   diphenoxylate-atropine (LOMOTIL) 2.5-0.025 MG tablet, Take  2 tablets by mouth 4 (four) times daily as needed for diarrhea or loose stools., Disp: 16 tablet, Rfl: 0   glucose blood (ACCU-CHEK GUIDE) test strip, USE AS DIRECTED, Disp: 100 strip, Rfl: 3   insulin glargine (LANTUS) 100 UNIT/ML Solostar Pen, Inject 16 Units into the skin daily., Disp: 3 mL, Rfl: 4   Multiple Vitamin (MULTIVITAMIN) capsule, Take 1 capsule by mouth daily., Disp: 90 capsule, Rfl: 0   vancomycin (VANCOCIN) 125 MG capsule, Take 1 capsule (125 mg total) by mouth 4 (four) times daily for 4 days., Disp: 16 capsule, Rfl: 0   Review of Systems  Constitutional:  Negative for activity change, appetite change, chills, diaphoresis, fatigue, fever and unexpected weight change.  HENT:  Negative for congestion, rhinorrhea, sinus pressure, sneezing, sore throat and trouble swallowing.   Eyes:  Negative for photophobia and visual disturbance.  Respiratory:  Negative for cough, chest tightness, shortness of breath, wheezing and stridor.   Cardiovascular:  Negative for chest pain, palpitations and leg swelling.  Gastrointestinal:  Positive for diarrhea. Negative for abdominal distention, abdominal pain, anal bleeding, blood in stool, constipation, nausea and vomiting.  Genitourinary:  Negative for difficulty urinating, dysuria, flank pain and hematuria.  Musculoskeletal:  Negative for arthralgias, back pain, gait problem, joint swelling and myalgias.  Skin:  Negative for color change, pallor, rash and wound.  Neurological:  Negative for dizziness, tremors, weakness and light-headedness.  Hematological:  Negative for adenopathy. Does not bruise/bleed easily.  Psychiatric/Behavioral:  Negative for agitation, behavioral problems, confusion, decreased concentration, dysphoric mood and sleep disturbance.       Objective:   Physical Exam Constitutional:      General: She is not in acute distress.    Appearance: Normal appearance. She is well-developed. She is not ill-appearing or diaphoretic.   HENT:     Head: Normocephalic and atraumatic.     Right Ear: Hearing and external ear normal.     Left Ear: Hearing and external ear normal.     Nose: No nasal deformity or rhinorrhea.  Eyes:     General: No scleral icterus.    Conjunctiva/sclera: Conjunctivae normal.     Right eye: Right conjunctiva is not injected.     Left eye: Left conjunctiva is not injected.     Pupils: Pupils are equal, round, and reactive to light.  Neck:     Vascular: No JVD.  Cardiovascular:     Rate and Rhythm: Normal rate and regular rhythm.     Heart sounds: S1 normal and S2 normal.  Pulmonary:     Effort: Pulmonary effort is normal. No respiratory distress.     Breath sounds: No wheezing.  Abdominal:     General: There is no distension.     Palpations: Abdomen is  soft.  Musculoskeletal:        General: Normal range of motion.     Right shoulder: Normal.     Left shoulder: Normal.     Cervical back: Normal range of motion and neck supple.     Right hip: Normal.     Left hip: Normal.     Right knee: Normal.     Left knee: Normal.  Lymphadenopathy:     Head:     Right side of head: No submandibular, preauricular or posterior auricular adenopathy.     Left side of head: No submandibular, preauricular or posterior auricular adenopathy.     Cervical: No cervical adenopathy.     Right cervical: No superficial or deep cervical adenopathy.    Left cervical: No superficial or deep cervical adenopathy.  Skin:    General: Skin is warm and dry.     Coloration: Skin is not pale.     Findings: No abrasion, bruising, ecchymosis, erythema, lesion or rash.     Nails: There is no clubbing.  Neurological:     General: No focal deficit present.     Mental Status: She is alert and oriented to person, place, and time.     Sensory: No sensory deficit.     Coordination: Coordination normal.     Gait: Gait normal.  Psychiatric:        Attention and Perception: Attention normal. She is attentive.        Mood  and Affect: Affect normal. Mood is anxious.        Speech: Speech normal.        Behavior: Behavior normal. Behavior is cooperative.        Thought Content: Thought content normal.        Judgment: Judgment normal.          Assessment & Plan:   Apparently refractory C. difficile colitis:  I did note the history of IBS noted in the problem list and her problems post cholecystectomy so there could be some component of IBS though she is VERY rare that the 10-12 bowel movements she is having a day now is not normal and that this is becoming a major impediment.  She says that even when she drinks simply a glass of water she frequently then have to go to the bathroom immediately.  She has not been able to attend classes at [facility] [employer].  I have been able to work with pharmacy to pain prior authorization for Dificid.  Sent Dificid to the [facility] where she can pick it up.  I have given her instructions on how to get otherwise 1 pharmacy.  She was asking for other medicines to treat the diarrhea but I do not want her to take Lomotil or loperamide at this point in time due to risk of toxic megacolon.  Have scheduled her with my partner Dr. West Bali to see how she has responded to therapy  If she is still not improving would try to retreat with dificid and obain Zimplava infusion. I would also strongly consider GI consult.  IBS: this was listed in her problem list and apparently she has had multiple trips to ER for loose stools prior to dx of C diff so there maybe a functional bowel disorder present as well.  I do not know if fecal transplantation is being done right now but typically in the past when it was done it was done for individuals and had multiple recurrences which he  has not yet had.    Screening for viral hepatitides she is negative for hepatitis B and C but not screen for hepatitis A she did not want blood work done today.  Vaccine counseling. I recommended she receive  flu shot but she did not want this today  I spent 82 minutes with the patient including than 50% of the time in face to face counseling of the patient guarding the nature of C. difficile colitis risk factors for it multiple treatment regimens that are possible for it along with personally reviewing along with review of medical records in preparation for the visit and during the visit and in coordination of her care.

## 2021-01-22 ENCOUNTER — Telehealth: Payer: Self-pay

## 2021-01-22 NOTE — Telephone Encounter (Signed)
Patient called complaining of increased diarrhea since starting the medication last week. Patient is concerned that the Dificid is causing more diarrhea and feels the medication is not working. I did let the patient know that she will need to give the medication more time.  Elizabeth Warren T Pricilla Loveless

## 2021-01-22 NOTE — Telephone Encounter (Signed)
Patient is scheduled for a sooner appointment with [facility].

## 2021-01-23 ENCOUNTER — Other Ambulatory Visit: Payer: Self-pay | Admitting: Family Medicine

## 2021-01-24 ENCOUNTER — Other Ambulatory Visit (HOSPITAL_COMMUNITY): Payer: Self-pay

## 2021-01-24 ENCOUNTER — Ambulatory Visit (INDEPENDENT_AMBULATORY_CARE_PROVIDER_SITE_OTHER): Payer: Medicaid Other | Admitting: Internal Medicine

## 2021-01-24 ENCOUNTER — Encounter: Payer: Self-pay | Admitting: Internal Medicine

## 2021-01-24 ENCOUNTER — Other Ambulatory Visit: Payer: Self-pay

## 2021-01-24 VITALS — BP 135/91 | HR 99 | Temp 98.1°F | Wt 206.0 lb

## 2021-01-24 DIAGNOSIS — A0472 Enterocolitis due to Clostridium difficile, not specified as recurrent: Secondary | ICD-10-CM | POA: Diagnosis not present

## 2021-01-24 NOTE — Progress Notes (Signed)
Patient Active Problem List   Diagnosis Date Noted   S/P cholecystectomy 01/17/2021   C. difficile diarrhea 01/16/2021   Left foot pain 11/01/2020   Chronic diarrhea 02/25/2020   Low TSH level 09/03/2019   Type 2 diabetes mellitus with hyperglycemia ([place]) 09/01/2019   Obesity (BMI 30-39.9) 10/31/2011    Patient's Medications  New Prescriptions   No medications on file  Previous Medications   ACCU-CHEK SOFTCLIX LANCETS LANCETS    Use as instructed   BLOOD GLUCOSE MONITORING SUPPL (ACCU-CHEK GUIDE ME) W/DEVICE KIT    1 Units by Does not apply route every other day.   CHOLESTYRAMINE (QUESTRAN) 4 G PACKET    DISSOLVE CONTENTS OF 1 PACKET(4 GRAMS) IN LIQUID AND DRINK BY MOUTH WITH BREAKFAST, LUNCH, AND EVENING MEAL.   DIPHENOXYLATE-ATROPINE (LOMOTIL) 2.5-0.025 MG TABLET    Take 2 tablets by mouth 4 (four) times daily as needed for diarrhea or loose stools.   FIDAXOMICIN (DIFICID) 200 MG TABS TABLET    Take 1 tablet (200 mg total) by mouth 2 (two) times daily.   GLUCOSE BLOOD (ACCU-CHEK GUIDE) TEST STRIP    USE AS DIRECTED   INSULIN GLARGINE (LANTUS) 100 UNIT/ML SOLOSTAR PEN    Inject 16 Units into the skin daily.   MULTIPLE VITAMIN (MULTIVITAMIN) CAPSULE    Take 1 capsule by mouth daily.  Modified Medications   No medications on file  Discontinued Medications   No medications on file    Subjective: 40 YF emigrated from [place] in 2002 with history of gestational diabetes, cholecystitis status post postcholecystectomy, possible irritable bowel syndrome, presents for management of C. difficile colitis.  In November, patient started having severe and frequent loose stools averaging about 12/day.  She had been given clindamycin for dental work a couple months prior to symptoms starting.  She was tested for C. difficile colitis on November 24 which returned positive for antigen, negative for toxin.  Started on vancomycin p.o. 125 mg 4 times daily.  Seen by Infectious disease Dr. Tommy Medal on 01/17/2021 and patient was started on Dificid with close follow-up.  Plan at that visit was if patient did not improve by next visit to add Zinplava to Dificid. Today, patient reports that she continues to have loose stools (about 12 times a day)even with the Dificid.  She reports associated abdominal pain.  She denies any blood in her stool.  She denies fever, chills, nausea, vomiting.  She reports no other household contacts including her 5 children have C. difficile.  She reports that her husband had to come back home during her illness to assist her.  12/28/20 C diff Ag positive, toxin negative.  Review of Systems: Review of Systems  All other systems reviewed and are negative.  Past Medical History:  Diagnosis Date   Acute cholecystitis 01/18/2020   Gestational diabetes    History of gestational diabetes 09/06/2016   Hypertension    pregnancy   NVD (normal vaginal delivery) 09/20/2010   Pre-existing type 2 diabetes mellitus during pregnancy in second trimester 10/02/2016   S/P cholecystectomy 01/17/2021    Social History   Tobacco Use   Smoking status: Never   Smokeless tobacco: Never  Vaping Use   Vaping Use: Never used  Substance Use Topics   Alcohol use: No   Drug use: No    Family History  Problem Relation Age of Onset   Diabetes Mother    Hypertension Mother  Diabetes Father    Hypertension Father    Diabetes Maternal Grandmother    Hypertension Maternal Grandmother    Diabetes Maternal Grandfather    Hypertension Maternal Grandfather    Diabetes Paternal Grandmother    Hypertension Paternal Grandmother    Diabetes Paternal Grandfather    Hypertension Paternal Grandfather    Cancer Neg Hx    Heart disease Neg Hx    Stroke Neg Hx     No Known Allergies  Health Maintenance  Topic Date Due   FOOT EXAM  Never done   OPHTHALMOLOGY EXAM  Never done   COVID-19 Vaccine (1) 01/31/2021 (Originally 12/13/1980)   INFLUENZA VACCINE  05/04/2021 (Originally  09/04/2020)   Pneumococcal Vaccine 7-46 Years old (1 - PCV) 01/15/2022 (Originally 06/11/1988)   HEMOGLOBIN A1C  04/29/2021   PAP SMEAR-Modifier  09/01/2022   TETANUS/TDAP  10/02/2026   Hepatitis C Screening  Completed   HIV Screening  Completed   HPV VACCINES  Aged Out   URINE MICROALBUMIN  Discontinued    Objective:  Vitals:   01/24/21 0852  BP: (!) 135/91  Pulse: 99  Temp: 98.1 F (36.7 C)  SpO2: 99%  Weight: 206 lb (93.4 kg)   Body mass index is 34.28 kg/m.  Physical Exam Constitutional:      Appearance: Normal appearance.  HENT:     Head: Normocephalic and atraumatic.     Right Ear: Tympanic membrane normal.     Left Ear: Tympanic membrane normal.     Nose: Nose normal.     Mouth/Throat:     Mouth: Mucous membranes are moist.  Eyes:     Extraocular Movements: Extraocular movements intact.     Conjunctiva/sclera: Conjunctivae normal.     Pupils: Pupils are equal, round, and reactive to light.  Cardiovascular:     Rate and Rhythm: Normal rate and regular rhythm.     Heart sounds: No murmur heard.   No friction rub. No gallop.  Pulmonary:     Effort: Pulmonary effort is normal.     Breath sounds: Normal breath sounds.  Abdominal:     General: Abdomen is flat.     Palpations: Abdomen is soft.  Musculoskeletal:        General: Normal range of motion.  Skin:    General: Skin is warm and dry.  Neurological:     General: No focal deficit present.     Mental Status: She is alert and oriented to person, place, and time.  Psychiatric:        Mood and Affect: Mood normal.    Lab Results Lab Results  Component Value Date   WBC 9.3 01/15/2021   HGB 12.9 01/15/2021   HCT 38.8 01/15/2021   MCV 83 01/15/2021   PLT 326 01/15/2021    Lab Results  Component Value Date   CREATININE 0.50 (L) 01/15/2021   BUN 8 01/15/2021   NA 137 01/15/2021   K 4.3 01/15/2021   CL 100 01/15/2021   CO2 21 01/15/2021    Lab Results  Component Value Date   ALT 26 01/15/2021    AST 18 01/15/2021   ALKPHOS 82 01/15/2021   BILITOT <0.2 01/15/2021    Lab Results  Component Value Date   CHOL 199 02/24/2020   HDL 43 02/24/2020   LDLCALC 140 (H) 02/24/2020   LDLDIRECT 120 (H) 10/31/2011   TRIG 90 02/24/2020   CHOLHDL 4.6 (H) 02/24/2020   Lab Results  Component Value Date   LABRPR  Non Reactive 03/06/2017   No results found for: HIV1RNAQUANT, HIV1RNAVL, CD4TABS   Problem List Items Addressed This Visit   None Visit Diagnoses     Enteritis due to Clostridium difficile    -  Primary   Relevant Orders   Ambulatory referral to Gastroenterology   QuantiFERON-TB Gold Plus   Hepatitis A antibody, total   CBC with Differential/Platelet   COMPLETE METABOLIC PANEL WITH GFR       Assessment: #C diff colitis -Patient has had about 12 loose stools per day since November, 2022 without improvement of symptoms on vancomycin and now Day 7 of Dificid.  Her timeline for Cdiff infection does add up as she was on clindamycin prior to loose stools.  It is interesting however, her electrolytes have been normal despite having multiple loose stools a day for over a month.   Plan: -Obtain cbc, cmp, IGRA and hepA -Zinplava 41m/kg(93.4kg)today x 1=934 mg -Administer another round of  Dificid(x10 days) -Follow-up in 10 days to monitor symptoms -Urgent referral to GI for possible fecal transplant if symptoms do not improve after Zinplava   MLaurice Record [facility] 01/24/2021, 8:57 AM

## 2021-01-25 ENCOUNTER — Encounter (HOSPITAL_COMMUNITY): Payer: Medicaid Other

## 2021-01-25 ENCOUNTER — Other Ambulatory Visit (HOSPITAL_COMMUNITY): Payer: Self-pay

## 2021-01-25 ENCOUNTER — Telehealth: Payer: Self-pay

## 2021-01-25 NOTE — Telephone Encounter (Signed)
Patient walked in the office this morning stating she no longer wants to proceed with doing the Zinplava infusion. Patient would like to just continue the Dificid for 10 days. I advised the patient she will need wait for the call for GI to be scheduled with them.  Jaceyon Strole T Pricilla Loveless

## 2021-01-27 LAB — COMPLETE METABOLIC PANEL WITH GFR
AG Ratio: 1.1 (calc) (ref 1.0–2.5)
ALT: 26 U/L (ref 6–29)
AST: 20 U/L (ref 10–30)
Albumin: 4.4 g/dL (ref 3.6–5.1)
Alkaline phosphatase (APISO): 77 U/L (ref 31–125)
BUN: 9 mg/dL (ref 7–25)
CO2: 24 mmol/L (ref 20–32)
Calcium: 9.8 mg/dL (ref 8.6–10.2)
Chloride: 100 mmol/L (ref 98–110)
Creat: 0.61 mg/dL (ref 0.50–0.97)
Globulin: 4 g/dL (calc) — ABNORMAL HIGH (ref 1.9–3.7)
Glucose, Bld: 154 mg/dL — ABNORMAL HIGH (ref 65–99)
Potassium: 4.5 mmol/L (ref 3.5–5.3)
Sodium: 137 mmol/L (ref 135–146)
Total Bilirubin: 0.3 mg/dL (ref 0.2–1.2)
Total Protein: 8.4 g/dL — ABNORMAL HIGH (ref 6.1–8.1)
eGFR: 117 mL/min/{1.73_m2} (ref >=60)

## 2021-01-27 LAB — CBC WITH DIFFERENTIAL/PLATELET
Absolute Monocytes: 530 cells/uL (ref 200–950)
Basophils Absolute: 90 cells/uL (ref 0–200)
Basophils Relative: 0.9 %
Eosinophils Absolute: 1140 cells/uL — ABNORMAL HIGH (ref 15–500)
Eosinophils Relative: 11.4 %
HCT: 42.6 % (ref 35.0–45.0)
Hemoglobin: 14.2 g/dL (ref 11.7–15.5)
Lymphs Abs: 2720 cells/uL (ref 850–3900)
MCH: 27.7 pg (ref 27.0–33.0)
MCHC: 33.3 g/dL (ref 32.0–36.0)
MCV: 83 fL (ref 80.0–100.0)
MPV: 11.2 fL (ref 7.5–12.5)
Monocytes Relative: 5.3 %
Neutro Abs: 5520 cells/uL (ref 1500–7800)
Neutrophils Relative %: 55.2 %
Platelets: 370 10*3/uL (ref 140–400)
RBC: 5.13 10*6/uL — ABNORMAL HIGH (ref 3.80–5.10)
RDW: 13 % (ref 11.0–15.0)
Total Lymphocyte: 27.2 %
WBC: 10 10*3/uL (ref 3.8–10.8)

## 2021-01-27 LAB — QUANTIFERON-TB GOLD PLUS
Mitogen-NIL: >10 IU/mL
NIL: 0.1 IU/mL
QuantiFERON-TB Gold Plus: NEGATIVE
TB1-NIL: 0 IU/mL
TB2-NIL: 0.01 IU/mL

## 2021-01-27 LAB — HEPATITIS A ANTIBODY, TOTAL: Hepatitis A AB,Total: REACTIVE — AB

## 2021-01-29 ENCOUNTER — Encounter (HOSPITAL_COMMUNITY): Payer: Self-pay

## 2021-01-29 ENCOUNTER — Other Ambulatory Visit: Payer: Self-pay

## 2021-01-29 ENCOUNTER — Emergency Department (HOSPITAL_COMMUNITY)
Admission: EM | Admit: 2021-01-29 | Discharge: 2021-01-29 | Disposition: A | Discharge status: Home / Self Care | Payer: Medicaid Other | Attending: Emergency Medicine | Admitting: Emergency Medicine

## 2021-01-29 DIAGNOSIS — I10 Essential (primary) hypertension: Secondary | ICD-10-CM | POA: Insufficient documentation

## 2021-01-29 DIAGNOSIS — Z794 Long term (current) use of insulin: Secondary | ICD-10-CM | POA: Insufficient documentation

## 2021-01-29 DIAGNOSIS — R1013 Epigastric pain: Secondary | ICD-10-CM | POA: Diagnosis not present

## 2021-01-29 DIAGNOSIS — E119 Type 2 diabetes mellitus without complications: Secondary | ICD-10-CM | POA: Insufficient documentation

## 2021-01-29 DIAGNOSIS — R197 Diarrhea, unspecified: Secondary | ICD-10-CM

## 2021-01-29 MED ORDER — DICYCLOMINE HCL 10 MG PO CAPS
10.0000 mg | ORAL_CAPSULE | Freq: Once | ORAL | Status: AC
  Administered 2021-01-29: 05:00:00 10 mg via ORAL
  Filled 2021-01-29: qty 1

## 2021-01-29 NOTE — ED Provider Notes (Signed)
[facility] DEPT Provider Note   CSN: [id number] Arrival date & time: 01/29/21  0355     History Chief Complaint  Patient presents with   Abdominal Cramping   Diarrhea    Elizabeth Warren is a 40 y.o. female.  The history is provided by the patient, a relative and medical records.  Abdominal Cramping  Diarrhea Elizabeth Warren is a 40 y.o. female who presents to the Emergency Department complaining of diarrhea.  She presents to the emergency department complaining of 1 month of diarrhea.  She has been evaluated by infectious disease and has been treated for C. difficile twice with Dificid.  She was last evaluated by ID on December 21.  She has been compliant with her home medications and her last dose of the Dificid was yesterday.  She presents today for evaluation due to persistent and worsening diarrhea.  She describes it as up to 13 bowel movements daily that are watery.  There is mucus mixed in.  Colors range from yellow to brown.  She states that sometimes it looks like vomit.  She states that she gets significant urgency and at times does not make it to the bathroom.  She has a associated abdominal cramping just before bowel movements.  She gets this when she tries to take anything by mouth.  No fevers, nausea, vomiting.  She presents to the emergency department today requesting assistance with the diarrhea.  When she last saw ID she was told she would be referred to GI, but she has not received a phone call from the office.    Past Medical History:  Diagnosis Date   Acute cholecystitis 01/18/2020   Gestational diabetes    History of gestational diabetes 09/06/2016   Hypertension    pregnancy   NVD (normal vaginal delivery) 09/20/2010   Pre-existing type 2 diabetes mellitus during pregnancy in second trimester 10/02/2016   S/P cholecystectomy 01/17/2021    Patient Active Problem List   Diagnosis Date Noted   S/P cholecystectomy 01/17/2021   C.  difficile diarrhea 01/16/2021   Left foot pain 11/01/2020   Chronic diarrhea 02/25/2020   Low TSH level 09/03/2019   Type 2 diabetes mellitus with hyperglycemia ([place]) 09/01/2019   Obesity (BMI 30-39.9) 10/31/2011    Past Surgical History:  Procedure Laterality Date   CHOLECYSTECTOMY N/A 01/19/2020   Procedure: LAPAROSCOPIC CHOLECYSTECTOMY;  Surgeon: Stark Klein, MD;  Location: [facility];  Service: General;  Laterality: N/A;   NO PAST SURGERIES       OB History     Gravida  5   Para  5   Term  5   Preterm  0   AB  0   Living  5      SAB  0   IAB  0   Ectopic  0   Multiple  0   Live Births  5           Family History  Problem Relation Age of Onset   Diabetes Mother    Hypertension Mother    Diabetes Father    Hypertension Father    Diabetes Maternal Grandmother    Hypertension Maternal Grandmother    Diabetes Maternal Grandfather    Hypertension Maternal Grandfather    Diabetes Paternal Grandmother    Hypertension Paternal Grandmother    Diabetes Paternal Grandfather    Hypertension Paternal Grandfather    Cancer Neg Hx    Heart disease Neg Hx    Stroke  Neg Hx     Social History   Tobacco Use   Smoking status: Never   Smokeless tobacco: Never  Vaping Use   Vaping Use: Never used  Substance Use Topics   Alcohol use: No   Drug use: No    Home Medications Prior to Admission medications   Medication Sig Start Date End Date Taking? Authorizing Provider  Accu-Chek Softclix Lancets lancets Use as instructed 11/15/20   Simmons-Robinson, Makiera, MD  Blood Glucose Monitoring Suppl (ACCU-CHEK GUIDE ME) w/Device KIT 1 Units by Does not apply route every other day. 09/03/19   Gifford Shave, MD  cholestyramine (QUESTRAN) 4 g packet DISSOLVE CONTENTS OF 1 PACKET(4 GRAMS) IN LIQUID AND DRINK BY MOUTH WITH BREAKFAST, LUNCH, AND EVENING MEAL. 01/23/21   Martyn Malay, MD  diphenoxylate-atropine (LOMOTIL) 2.5-0.025 MG tablet Take 2 tablets by mouth 4  (four) times daily as needed for diarrhea or loose stools. 12/28/20   Ripley Fraise, MD  fidaxomicin (DIFICID) 200 MG TABS tablet Take 1 tablet (200 mg total) by mouth 2 (two) times daily. 01/17/21   Truman Hayward, MD  glucose blood (ACCU-CHEK GUIDE) test strip USE AS DIRECTED 11/15/20   Simmons-Robinson, Riki Sheer, MD  insulin glargine (LANTUS) 100 UNIT/ML Solostar Pen Inject 16 Units into the skin daily. 11/15/20   Simmons-Robinson, Riki Sheer, MD  Multiple Vitamin (MULTIVITAMIN) capsule Take 1 capsule by mouth daily. Patient not taking: Reported on 01/17/2021 01/15/21   Carollee Leitz, MD    Allergies    Patient has no known allergies.  Review of Systems   Review of Systems  Gastrointestinal:  Positive for diarrhea.  All other systems reviewed and are negative.  Physical Exam Updated Vital Signs BP (!) 144/110 (BP Location: Right Arm)    Pulse 97    Temp 98.5 F (36.9 C) (Oral)    Resp 18    Ht 5' 5"  (1.651 m)    Wt 93.4 kg    SpO2 97%    BMI 34.28 kg/m   Physical Exam Vitals and nursing note reviewed.  Constitutional:      Appearance: She is well-developed.  HENT:     Head: Normocephalic and atraumatic.  Cardiovascular:     Rate and Rhythm: Normal rate and regular rhythm.  Pulmonary:     Effort: Pulmonary effort is normal. No respiratory distress.  Abdominal:     Palpations: Abdomen is soft.     Tenderness: There is no guarding or rebound.     Comments: Mild epigastric tenderness  Musculoskeletal:        General: No tenderness.  Skin:    General: Skin is warm and dry.  Neurological:     Mental Status: She is alert and oriented to person, place, and time.  Psychiatric:        Behavior: Behavior normal.    ED Results / Procedures / Treatments   Labs (all labs ordered are listed, but only abnormal results are displayed) Labs Reviewed - No data to display  EKG None  Radiology No results found.  Procedures Procedures   Medications Ordered in  ED Medications  dicyclomine (BENTYL) capsule 10 mg (10 mg Oral Given 01/29/21 0443)    ED Course  I have reviewed the triage vital signs and the nursing notes.  Pertinent labs & imaging results that were available during my care of the patient were reviewed by me and considered in my medical decision making (see chart for details).    MDM Rules/Calculators/A&P  Patient just completed 2 rounds of Dificid for C. difficile infection.  She presents requesting medications to assist with diarrhea.  Discussed with patient and daughter that treating with antidiarrhea medications can be dangerous for this type of infection.  Recommend that we further evaluate her symptoms with labs, possible imaging if needed.  Patient refuses any additional interventions.  Will discharge with GI referral.      Final Clinical Impression(s) / ED Diagnoses Final diagnoses:  Diarrhea, unspecified type    Rx / DC Orders ED Discharge Orders     None        Quintella Reichert, MD 01/29/21 [phone]

## 2021-01-29 NOTE — ED Triage Notes (Signed)
Pt was diagnosed with C Diff 2 weeks ago and has had non stop diarrhea that has worsened over the past 2 days. Pt states that she is unable to go anywhere because of the diarrhea. Pt has taken anti diarrheal medications with no relief.

## 2021-01-30 ENCOUNTER — Telehealth: Payer: Self-pay

## 2021-01-30 DIAGNOSIS — R1030 Lower abdominal pain, unspecified: Secondary | ICD-10-CM | POA: Diagnosis not present

## 2021-01-30 DIAGNOSIS — A0472 Enterocolitis due to Clostridium difficile, not specified as recurrent: Secondary | ICD-10-CM | POA: Diagnosis not present

## 2021-01-30 NOTE — Telephone Encounter (Signed)
Per front desk, patient called requesting next available appointment. She was seen in the ED yesterday and the note mentions an urgent referral was placed to GI. Patient reported to front desk that she saw GI doctor and they recommended she follow up with [place] as soon as possible. Patient scheduled for 12/30.  Sandie Ano, RN

## 2021-02-02 ENCOUNTER — Other Ambulatory Visit: Payer: Self-pay

## 2021-02-02 ENCOUNTER — Ambulatory Visit (INDEPENDENT_AMBULATORY_CARE_PROVIDER_SITE_OTHER): Payer: Medicaid Other | Admitting: Internal Medicine

## 2021-02-02 VITALS — HR 91 | Temp 98.1°F | Wt 207.0 lb

## 2021-02-02 DIAGNOSIS — R197 Diarrhea, unspecified: Secondary | ICD-10-CM

## 2021-02-02 NOTE — Progress Notes (Signed)
Patient Active Problem List   Diagnosis Date Noted   S/P cholecystectomy 01/17/2021   C. difficile diarrhea 01/16/2021   Left foot pain 11/01/2020   Chronic diarrhea 02/25/2020   Low TSH level 09/03/2019   Type 2 diabetes mellitus with hyperglycemia ([place]) 09/01/2019   Obesity (BMI 30-39.9) 10/31/2011    Patient's Medications  New Prescriptions   No medications on file  Previous Medications   ACCU-CHEK SOFTCLIX LANCETS LANCETS    Use as instructed   BLOOD GLUCOSE MONITORING SUPPL (ACCU-CHEK GUIDE ME) W/DEVICE KIT    1 Units by Does not apply route every other day.   CHOLESTYRAMINE (QUESTRAN) 4 G PACKET    DISSOLVE CONTENTS OF 1 PACKET(4 GRAMS) IN LIQUID AND DRINK BY MOUTH WITH BREAKFAST, LUNCH, AND EVENING MEAL.   COLESTIPOL (COLESTID) 1 G TABLET    Take 2 g by mouth daily.   DICYCLOMINE (BENTYL) 20 MG TABLET    Take 20 mg by mouth 3 (three) times daily.   DIPHENOXYLATE-ATROPINE (LOMOTIL) 2.5-0.025 MG TABLET    Take 2 tablets by mouth 4 (four) times daily as needed for diarrhea or loose stools.   FIDAXOMICIN (DIFICID) 200 MG TABS TABLET    Take 1 tablet (200 mg total) by mouth 2 (two) times daily.   GLUCOSE BLOOD (ACCU-CHEK GUIDE) TEST STRIP    USE AS DIRECTED   HYOSCYAMINE (ANASPAZ) 0.125 MG TBDP DISINTERGRATING TABLET    Take 0.125 mg by mouth every 4 (four) hours as needed.   INSULIN GLARGINE (LANTUS) 100 UNIT/ML SOLOSTAR PEN    Inject 16 Units into the skin daily.   MULTIPLE VITAMIN (MULTIVITAMIN) CAPSULE    Take 1 capsule by mouth daily.  Modified Medications   No medications on file  Discontinued Medications   No medications on file    Subjective: 5 YF emigrated from [place] in 2002 with history of gestational diabetes, cholecystitis status post postcholecystectomy, possible irritable bowel syndrome, presents for management of C. difficile colitis.  In November, patient started having severe and frequent loose stools averaging about 12/day.  She had been given  clindamycin for dental work a couple months prior to symptoms starting.  She was tested for C. difficile colitis on November 24 which returned positive for antigen, negative for toxin.  Started on vancomycin p.o. 125 mg 4 times daily.  Seen by Infectious disease Dr. Tommy Medal on 01/17/2021 and patient was started on Dificid with close follow-up.  Plan at that visit was if patient did not improve by next visit to add Zinplava to Dificid. 01/24/21: patient reports that she continues to have loose stools (about 12 times a day)even with the Dificid.  She reports associated abdominal pain.  She denies any blood in her stool.  She denies fever, chills, nausea, vomiting.  She reports no other household contacts including her 5 children have C. difficile.  She reports that her husband had to come back home during her illness to assist her. Today 02/02/21: Pt reports she stopped taking Dificid after GI appointment last week. Her diarrhea has significantly improved to about 5 BM/day. She had 1 BM today. She did not wish to take Zinplava due to side effect profile.  Review of Systems: Review of Systems  All other systems reviewed and are negative.  Past Medical History:  Diagnosis Date   Acute cholecystitis 01/18/2020   Gestational diabetes    History of gestational diabetes 09/06/2016   Hypertension    pregnancy  NVD (normal vaginal delivery) 09/20/2010   Pre-existing type 2 diabetes mellitus during pregnancy in second trimester 10/02/2016   S/P cholecystectomy 01/17/2021    Social History   Tobacco Use   Smoking status: Never   Smokeless tobacco: Never  Vaping Use   Vaping Use: Never used  Substance Use Topics   Alcohol use: No   Drug use: No    Family History  Problem Relation Age of Onset   Diabetes Mother    Hypertension Mother    Diabetes Father    Hypertension Father    Diabetes Maternal Grandmother    Hypertension Maternal Grandmother    Diabetes Maternal Grandfather    Hypertension  Maternal Grandfather    Diabetes Paternal Grandmother    Hypertension Paternal Grandmother    Diabetes Paternal Grandfather    Hypertension Paternal Grandfather    Cancer Neg Hx    Heart disease Neg Hx    Stroke Neg Hx     No Known Allergies  Health Maintenance  Topic Date Due   COVID-19 Vaccine (1) Never done   FOOT EXAM  Never done   OPHTHALMOLOGY EXAM  Never done   INFLUENZA VACCINE  05/04/2021 (Originally 09/04/2020)   Pneumococcal Vaccine 35-82 Years old (1 - PCV) 01/15/2022 (Originally [birth year]/11/1988)   HEMOGLOBIN A1C  04/29/2021   PAP SMEAR-Modifier  09/01/2022   TETANUS/TDAP  10/02/2026   Hepatitis C Screening  Completed   HIV Screening  Completed   HPV VACCINES  Aged Out   URINE MICROALBUMIN  Discontinued    Objective:  Vitals:   02/02/21 0843  Pulse: 91  Temp: 98.1 F (36.7 C)  TempSrc: Oral  SpO2: 98%  Weight: 207 lb (93.9 kg)   Body mass index is 34.45 kg/m.  Physical Exam Constitutional:      Appearance: Normal appearance.  HENT:     Head: Normocephalic and atraumatic.     Right Ear: Tympanic membrane normal.     Left Ear: Tympanic membrane normal.     Nose: Nose normal.     Mouth/Throat:     Mouth: Mucous membranes are moist.  Eyes:     Extraocular Movements: Extraocular movements intact.     Conjunctiva/sclera: Conjunctivae normal.     Pupils: Pupils are equal, round, and reactive to light.  Cardiovascular:     Heart sounds: No murmur heard.   No friction rub. No gallop.  Pulmonary:     Effort: Pulmonary effort is normal.  Abdominal:     General: Abdomen is flat.  Musculoskeletal:        General: Normal range of motion.  Skin:    General: Skin is warm and dry.  Neurological:     General: No focal deficit present.     Mental Status: She is alert and oriented to person, place, and time.  Psychiatric:        Mood and Affect: Mood normal.    Lab Results Lab Results  Component Value Date   WBC 10.0 01/24/2021   HGB 14.2 01/24/2021   HCT  42.6 01/24/2021   MCV 83.0 01/24/2021   PLT 370 01/24/2021    Lab Results  Component Value Date   CREATININE 0.61 01/24/2021   BUN 9 01/24/2021   NA 137 01/24/2021   K 4.5 01/24/2021   CL 100 01/24/2021   CO2 24 01/24/2021    Lab Results  Component Value Date   ALT 26 01/24/2021   AST 20 01/24/2021   ALKPHOS 82 01/15/2021  BILITOT 0.3 01/24/2021    Lab Results  Component Value Date   CHOL 199 02/24/2020   HDL 43 02/24/2020   LDLCALC 140 (H) 02/24/2020   LDLDIRECT 120 (H) 10/31/2011   TRIG 90 02/24/2020   CHOLHDL 4.6 (H) 02/24/2020   Lab Results  Component Value Date   LABRPR Non Reactive 03/06/2017   No results found for: HIV1RNAQUANT, HIV1RNAVL, CD4TABS   Problem List Items Addressed This Visit   None Visit Diagnoses     Diarrhea of presumed infectious origin    -  Primary   Relevant Orders   Ova and parasite examination   Culture, Stool       #Presumed C diff colitis # Diarrhea-improving -Plan at last visit was to do Zimplava x1 + another 10 days of dificid. She was seen by GI last week, started on limotil/colistid/probiotics. Last dose of Dificid was on 01/28/21(completed 11 days). She stopped dificid on her own and ultimately decided not to do Zinplava due to side effect profile.  -Diarrhea has improved, pt reports one BM this morning. She has GI follow-up planned in about a month. GI is considering colonoscopy. Will do Stool studies with O&P and Stool Cx as eosinophilia present on cbc. Plan -Follow-up with GI -Stool studies -Follow-up with ID in 2 months  Laurice Record, MD [facility] 02/02/2021, 9:23 AM

## 2021-02-06 ENCOUNTER — Other Ambulatory Visit: Payer: Medicaid Other

## 2021-02-06 ENCOUNTER — Other Ambulatory Visit: Payer: Self-pay

## 2021-02-06 DIAGNOSIS — R197 Diarrhea, unspecified: Secondary | ICD-10-CM | POA: Diagnosis not present

## 2021-02-06 NOTE — Addendum Note (Signed)
Addended by: Harley Alto on: 02/06/2021 10:15 AM   Modules accepted: Orders

## 2021-02-08 ENCOUNTER — Encounter (HOSPITAL_BASED_OUTPATIENT_CLINIC_OR_DEPARTMENT_OTHER): Payer: Self-pay | Admitting: Emergency Medicine

## 2021-02-08 ENCOUNTER — Other Ambulatory Visit: Payer: Self-pay

## 2021-02-08 ENCOUNTER — Emergency Department (HOSPITAL_BASED_OUTPATIENT_CLINIC_OR_DEPARTMENT_OTHER)
Admission: EM | Admit: 2021-02-08 | Discharge: 2021-02-08 | Disposition: A | Discharge status: Home / Self Care | Payer: Medicaid Other | Attending: Emergency Medicine | Admitting: Emergency Medicine

## 2021-02-08 DIAGNOSIS — Z5321 Procedure and treatment not carried out due to patient leaving prior to being seen by health care provider: Secondary | ICD-10-CM | POA: Diagnosis not present

## 2021-02-08 DIAGNOSIS — R197 Diarrhea, unspecified: Secondary | ICD-10-CM | POA: Diagnosis not present

## 2021-02-08 LAB — CBG MONITORING, ED: Glucose-Capillary: 234 mg/dL — ABNORMAL HIGH (ref 70–99)

## 2021-02-08 NOTE — ED Notes (Signed)
NO answer from pt x3

## 2021-02-08 NOTE — ED Triage Notes (Signed)
Pt arrives to ED with c/o diarrhea. Pt currently has dx of C-difficile, that was dx in November 2022. Pt reports her diarrhea continues and is not improving. Pt on medication regimen with infectious disease & GI and was seen on 12/30 for this.

## 2021-02-09 LAB — OVA AND PARASITE EXAMINATION
CONCENTRATE RESULT:: NONE SEEN
MICRO NUMBER:: 12821142
SPECIMEN QUALITY:: ADEQUATE
TRICHROME RESULT:: NONE SEEN

## 2021-02-09 LAB — SALMONELLA/SHIGELLA CULT, CAMPY EIA AND SHIGA TOXIN RFL ECOLI
MICRO NUMBER: 12821666
MICRO NUMBER:: 12821667
MICRO NUMBER:: 12821668
Result:: NOT DETECTED
SHIGA RESULT:: NOT DETECTED
SPECIMEN QUALITY: ADEQUATE
SPECIMEN QUALITY:: ADEQUATE
SPECIMEN QUALITY:: ADEQUATE

## 2021-02-14 ENCOUNTER — Ambulatory Visit: Payer: Medicaid Other | Admitting: Infectious Diseases

## 2021-02-16 ENCOUNTER — Ambulatory Visit (INDEPENDENT_AMBULATORY_CARE_PROVIDER_SITE_OTHER): Payer: Medicaid Other | Admitting: Family Medicine

## 2021-02-16 ENCOUNTER — Other Ambulatory Visit: Payer: Self-pay

## 2021-02-16 ENCOUNTER — Encounter: Payer: Self-pay | Admitting: Family Medicine

## 2021-02-16 VITALS — BP 122/75 | HR 115 | Wt 204.4 lb

## 2021-02-16 DIAGNOSIS — A0472 Enterocolitis due to Clostridium difficile, not specified as recurrent: Secondary | ICD-10-CM

## 2021-02-16 DIAGNOSIS — E1165 Type 2 diabetes mellitus with hyperglycemia: Secondary | ICD-10-CM | POA: Diagnosis not present

## 2021-02-16 DIAGNOSIS — Z794 Long term (current) use of insulin: Secondary | ICD-10-CM

## 2021-02-16 LAB — POCT GLYCOSYLATED HEMOGLOBIN (HGB A1C): HbA1c, POC (controlled diabetic range): 8.2 % — AB (ref 0.0–7.0)

## 2021-02-16 MED ORDER — INSULIN GLARGINE 100 UNIT/ML SOLOSTAR PEN: 20.0000 [IU] | PEN_INJECTOR | Freq: Every day | SUBCUTANEOUS | 4 refills | Status: DC

## 2021-02-16 NOTE — Progress Notes (Signed)
° ° ° °  SUBJECTIVE:   CHIEF COMPLAINT / HPI:   Elizabeth Warren is a 41 y.o. female presents for diarrhea  Diarrhea In November treated with clindamycin following tooth infection. Has experienced 2 month hx of diarrhea. Diarrhea 12 times a day. Dx with C. Difficile soon after and treated with Vancomycin and Fidxomicin. Was planned to put Saw GI 2 weeks ago who started her on dicyclomine, hyoscamine, colestipol. Now experiencing watery, mucus like diarrhea. Denies bloody stool or melena. Now having 4 x BM, abdiminal cramps improving. Approx 7-9 lb weight loss due to poor appetite with infection.     Diabetes Patient's current diabetic medications include lantus. Tolerating well without side effects.  Patient endorses compliance with these medications. Patient's last A1c was  Lab Results  Component Value Date   HGBA1C 8.2 (A) 02/16/2021   HGBA1C 8.3 (A) 10/30/2020   HGBA1C 8.1 (A) 07/27/2020   Denies abdominal pain, blurred vision, polyuria, polydipsia, hypoglycemia. Patient states they understand that diet and exercise can help with her diabetes.    Last Microalbumin, LDL, Creatinine: Lab Results  Component Value Date   LDLCALC 140 (H) 02/24/2020   CREATININE 0.61 01/24/2021    Flowsheet Row Office Visit from 10/30/2020 in [facility]  PHQ-9 Total Score 4        PERTINENT  PMH / PSH: T2DM, C difficile   OBJECTIVE:   BP 122/75    Pulse (!) 115    Wt 204 lb 6.4 oz (92.7 kg)    SpO2 100%    BMI 34.01 kg/m    General: Alert, no acute distress Cardio: Normal S1 and S2, RRR, no r/m/g Pulm: CTAB, normal work of breathing Abdomen: Bowel sounds normal. Abdomen soft and non-tender.  Extremities: No peripheral edema.  Neuro: Cranial nerves grossly intact   ASSESSMENT/PLAN:   C. difficile diarrhea On going symptoms from recent C difficile infection however improving. No signs of dehydration. Continue medication prescribed by GI. Reassured pt and encouraged  continued follow up with GI. Pending colonoscopy in April. Strict ER precautions given.  Type 2 diabetes mellitus with hyperglycemia (HCC) A1c 8.2, stable. Pt continues to decline any other medication than lantus. Continue Lantus 22 units. Follow up in 3 months.    Towanda Octave, MD PGY-3 [facility]

## 2021-02-16 NOTE — Patient Instructions (Signed)
Thank you for coming to see me today. It was a pleasure. Today we discussed your diabetes. It is stable. I have refilled the lantus. There are better drugs than lantus out there. We can discuss these if you change your mind about the lantus.  I recommend you follow up with GI for your diabetes. It is improving which is great.  Please follow-up with PCP in 2-3 months.  If you have any questions or concerns, please do not hesitate to call the office at [phone].  Best wishes,   Dr Posey Pronto

## 2021-02-22 NOTE — Assessment & Plan Note (Signed)
On going symptoms from recent C difficile infection however improving. No signs of dehydration. Continue medication prescribed by GI. Reassured pt and encouraged continued follow up with GI. Pending colonoscopy in April. Strict ER precautions given.

## 2021-02-22 NOTE — Assessment & Plan Note (Addendum)
A1c 8.2, stable. Pt continues to decline any other medication than lantus. Continue Lantus 22 units. Follow up in 3 months.

## 2021-02-23 DIAGNOSIS — R197 Diarrhea, unspecified: Secondary | ICD-10-CM | POA: Diagnosis not present

## 2021-02-23 DIAGNOSIS — R634 Abnormal weight loss: Secondary | ICD-10-CM | POA: Diagnosis not present

## 2021-02-28 DIAGNOSIS — K649 Unspecified hemorrhoids: Secondary | ICD-10-CM | POA: Diagnosis not present

## 2021-02-28 DIAGNOSIS — R197 Diarrhea, unspecified: Secondary | ICD-10-CM | POA: Diagnosis not present

## 2021-03-07 ENCOUNTER — Encounter: Payer: Self-pay | Admitting: Family Medicine

## 2021-03-07 ENCOUNTER — Ambulatory Visit: Payer: Medicaid Other

## 2021-03-07 ENCOUNTER — Ambulatory Visit (INDEPENDENT_AMBULATORY_CARE_PROVIDER_SITE_OTHER): Payer: Medicaid Other | Admitting: Family Medicine

## 2021-03-07 ENCOUNTER — Other Ambulatory Visit: Payer: Self-pay

## 2021-03-07 VITALS — BP 126/80 | HR 64 | Wt 204.0 lb

## 2021-03-07 DIAGNOSIS — Z021 Encounter for pre-employment examination: Secondary | ICD-10-CM | POA: Diagnosis not present

## 2021-03-07 MED ORDER — WESTAB PLUS 27-1 MG PO TABS: 1.0000 | ORAL_TABLET | Freq: Every day | ORAL | 2 refills | Status: DC

## 2021-03-07 NOTE — Progress Notes (Signed)
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 Needs to have physical exam and TB testing as well as form completed prior to starting.  Denies complaints today. Just recently had visit for her diabetes.  She does request refills on her daily vitamin, specifically wants the Westab vitamin, which she took in the past and she feels helped with her overall energy/wellbeing.   PERTINENT  PMH / PSH: type 2 diabetes, C Diff Infection  OBJECTIVE:   BP 126/80    Pulse 64    Wt 204 lb (92.5 kg)    SpO2 98%    BMI 33.95 kg/m   General: NAD, pleasant, able to participate in exam Cardiac: RRR, S1 S2 present. normal heart sounds, no murmurs. Respiratory: CTAB, normal effort, No wheezes, rales or rhonchi Abdomen: Bowel sounds present, soft, non-tender, non-distende Extremities: no edema or cyanosis. Skin: warm and dry, no rashes noted Neuro: alert, no obvious focal deficits Psych: Normal affect and mood   ASSESSMENT/PLAN:   Work Clearance, TB Screening PPD placed today. Clearance form completed for patient's work. She will pick up form at nurse visit on 2/3 after PPD interpretation. No concerns about patient's ability to perform job functions. She is UTD on all required vaccinations (Tdap given 10/01/2016, MMR and hepatitis immune during most recent pregnancy).  Refills sent on Westab vitamin per her request.  Alcus Dad, MD [facility]

## 2021-03-07 NOTE — Progress Notes (Signed)
.  [facility] 

## 2021-03-07 NOTE — Patient Instructions (Signed)
It was great to see you!  Today we placed your TB test. We will read the results on Friday and finish filling out the form for work.  I have sent refills on your Westab vitamin. They should be ready at the pharmacy for you.  Take care, Dr Anner Crete

## 2021-03-09 ENCOUNTER — Ambulatory Visit (INDEPENDENT_AMBULATORY_CARE_PROVIDER_SITE_OTHER): Payer: Medicaid Other

## 2021-03-09 ENCOUNTER — Other Ambulatory Visit: Payer: Self-pay

## 2021-03-09 DIAGNOSIS — Z111 Encounter for screening for respiratory tuberculosis: Secondary | ICD-10-CM

## 2021-03-09 LAB — TB SKIN TEST
Induration: 0 mm
TB Skin Test: NEGATIVE

## 2021-03-09 NOTE — Progress Notes (Signed)
Patient is here for a PPD read.  It was placed on 03/07/2021 in the left forearm @ 3:25 pm.    PPD RESULTS:  Result: negative Induration: 0 mm  Letter created and given to patient for documentation purposes. Veronda Prude, RN

## 2021-03-09 NOTE — Addendum Note (Signed)
Addended by: Georges Lynch T on: 03/09/2021 03:31 PM   Modules accepted: Orders

## 2021-03-12 ENCOUNTER — Other Ambulatory Visit: Payer: Self-pay | Admitting: Family Medicine

## 2021-04-10 ENCOUNTER — Ambulatory Visit (INDEPENDENT_AMBULATORY_CARE_PROVIDER_SITE_OTHER): Payer: Medicaid Other | Admitting: Student

## 2021-04-10 ENCOUNTER — Other Ambulatory Visit (HOSPITAL_COMMUNITY)
Admission: RE | Admit: 2021-04-10 | Discharge: 2021-04-10 | Disposition: A | Discharge status: Home / Self Care | Payer: Medicaid Other | Source: Ambulatory Visit | Attending: Family Medicine | Admitting: Family Medicine

## 2021-04-10 ENCOUNTER — Telehealth: Payer: Self-pay

## 2021-04-10 ENCOUNTER — Other Ambulatory Visit: Payer: Self-pay

## 2021-04-10 ENCOUNTER — Encounter: Payer: Self-pay | Admitting: Student

## 2021-04-10 VITALS — BP 118/80 | HR 98 | Wt 206.8 lb

## 2021-04-10 DIAGNOSIS — Z9049 Acquired absence of other specified parts of digestive tract: Secondary | ICD-10-CM | POA: Diagnosis not present

## 2021-04-10 DIAGNOSIS — B3731 Acute candidiasis of vulva and vagina: Secondary | ICD-10-CM | POA: Diagnosis not present

## 2021-04-10 DIAGNOSIS — R109 Unspecified abdominal pain: Secondary | ICD-10-CM | POA: Diagnosis not present

## 2021-04-10 LAB — POCT WET PREP (WET MOUNT)
Clue Cells Wet Prep Whiff POC: NEGATIVE
Trichomonas Wet Prep HPF POC: ABSENT

## 2021-04-10 MED ORDER — LOPERAMIDE HCL 2 MG PO CAPS: 2.0000 mg | ORAL_CAPSULE | ORAL | 0 refills | Status: DC | PRN

## 2021-04-10 MED ORDER — FLUCONAZOLE 150 MG PO TABS: 150.0000 mg | ORAL_TABLET | Freq: Every day | ORAL | 0 refills | Status: DC

## 2021-04-10 NOTE — Telephone Encounter (Signed)
Received phone call from pharmacist regarding clarification on Loperamide rx. Per insurance, they need frequency per day that patient can take medication.  ? ?Sent secure message to Dr. Marisue Humble who gave okay for patient to take up to 4 times daily as needed.  ? ?Air cabin crew and provided with verbal order.  ? ?Veronda Prude, RN ?  ?

## 2021-04-10 NOTE — Assessment & Plan Note (Signed)
As her C. difficile has been adequately treated and is now resolved, I will refill a small quantity of loperamide for her.  Advised her to stop taking the loperamide immediately if she begins having watery stools that raise concern for recurrent C. difficile. ?

## 2021-04-10 NOTE — Assessment & Plan Note (Addendum)
Confirmed location of Mirena on pelvic exam. Wet prep KOH slide revealed yeast buds. Expect that the "strings" she saw were in reality stringy mucoid discharge from her yeast infection.  ?- Will prescribe Diflucan x1 with additional pill to be taken in 72 hours if no improvement.  ?- Follow-up GC/Chlamydia swabs ?

## 2021-04-10 NOTE — Patient Instructions (Addendum)
Ms Clowdus, ? ?I am glad that your Mirena remains in the correct location.  It looks like you have a yeast infection which is probably the explanation for your symptoms.  I am sending in a treatment to your pharmacy.  Please take 1 pill when you get the prescription.  If your symptoms have not gotten better in 72 hours, you may take the other pill. ?In the meantime, I am refilling your loperamide.  You can continue to use this on an as-needed basis.  But if you start having the frequent watery stools that you are having with your C. difficile STOP using the loperamide immediately and call our office. ? ?Dorothyann Gibbs, MD ? ?

## 2021-04-10 NOTE — Progress Notes (Signed)
? ? ?  SUBJECTIVE:  ? ?CHIEF COMPLAINT / HPI:  ? ?Vaginal Pain ?Patient coming in with 1 week of vaginal pain and concerned that her Mirena may be misplaced.  Had mirena placed in Feb 2019 and is concerned because she believes she saw clear/white strings coming out of her vagina a few days ago and was worried that her Mirena had come misplaced.  She denies any new sexual contacts and does not have any concerns for STIs at this time.  She does report crampy vaginal pain radiating to the right adnexa and to the back. Per chart review, patient has a history of vulvovaginal candidiasis in 2019 treated with diflucan.  ? ?Difficulty with fatty foods s/p cholecystectomy ?Patient reports difficulty with fatty foods that has been ongoing since her cholecystectomy in December 2021.  She was previously taking loperamide for issues with loose stools after eating fatty foods.  She was treated for C. difficile last year.  Her loperamide was discontinued during the acute phase of illness due to contraindication and C. difficile infection.  She is now status post treatment for her C. difficile and reports no ongoing issues with loose stool.  She has followed with ID for this.  She would like a refill of her loperamide to be used as needed when she has loose stools after eating fatty foods. ? ? ?OBJECTIVE:  ? ?BP 118/80   Pulse 98   Wt 93.8 kg   SpO2 98%   BMI 34.41 kg/m?   ?General: NAD, pleasant, able to participate in exam ?Respiratory: Normal effort, no obvious respiratory distress ?Pelvic: VULVA: normal appearing vulva with no masses, tenderness or lesions, VAGINA: Normal appearing vagina with normal color, no lesions, with white, thick, and mucoid discharge present CERVIX: No lesions, white, thick, and mucoid discharge present.  IUD strings visualized in cervical os. Cervix bled readily to introduction of swab. ?Bimanual exam with R-adnexal tenderness, though exam of uterus limited by body habitus. No cervical motion  tenderness.  ? ?Chaperone Deseree present for pelvic exam ? ?ASSESSMENT/PLAN:  ? ?Vulvovaginal candidiasis ?Confirmed location of Mirena on pelvic exam. Wet prep KOH slide revealed yeast buds. Expect that the "strings" she saw were in reality stringy mucoid discharge from her yeast infection.  ?- Will prescribe Diflucan x1 with additional pill to be taken in 72 hours if no improvement.  ?- Follow-up GC/Chlamydia swabs ? ?S/P cholecystectomy ?As her C. difficile has been adequately treated and is now resolved, I will refill a small quantity of loperamide for her.  Advised her to stop taking the loperamide immediately if she begins having watery stools that raise concern for recurrent C. difficile. ?  ? ?Dorothyann Gibbs, MD ?[facility]  ?

## 2021-04-11 LAB — CERVICOVAGINAL ANCILLARY ONLY
Chlamydia: NEGATIVE
Comment: NEGATIVE
Comment: NORMAL
Neisseria Gonorrhea: NEGATIVE

## 2021-04-15 ENCOUNTER — Encounter: Payer: Self-pay | Admitting: Student

## 2021-04-24 ENCOUNTER — Other Ambulatory Visit: Payer: Self-pay | Admitting: Student

## 2021-04-24 DIAGNOSIS — Z9049 Acquired absence of other specified parts of digestive tract: Secondary | ICD-10-CM

## 2021-07-05 ENCOUNTER — Ambulatory Visit (INDEPENDENT_AMBULATORY_CARE_PROVIDER_SITE_OTHER): Payer: Medicaid Other | Admitting: Family Medicine

## 2021-07-05 VITALS — BP 124/76 | HR 117 | Ht 65.0 in | Wt 207.4 lb

## 2021-07-05 DIAGNOSIS — K529 Noninfective gastroenteritis and colitis, unspecified: Secondary | ICD-10-CM

## 2021-07-05 MED ORDER — INSULIN GLARGINE 100 UNIT/ML SOLOSTAR PEN: 20.0000 [IU] | PEN_INJECTOR | Freq: Every day | SUBCUTANEOUS | 4 refills | Status: DC

## 2021-07-05 MED ORDER — DICYCLOMINE HCL 20 MG PO TABS: 20.0000 mg | ORAL_TABLET | Freq: Three times a day (TID) | ORAL | 1 refills | Status: DC

## 2021-07-05 MED ORDER — WESTAB PLUS 27-1 MG PO TABS: 1.0000 | ORAL_TABLET | Freq: Every day | ORAL | 2 refills | Status: DC

## 2021-07-05 NOTE — Progress Notes (Signed)
    SUBJECTIVE:   CHIEF COMPLAINT / HPI:   Chronic diarrhea Patient presents today for treatment of her chronic diarrhea.  She reports that she has been taking Imodium daily for over a year and continues to have issues with diarrhea when eating certain foods.  Reports that she saw a GI doctor approximately 6 months ago related colonoscopy and EGD and she states that she was told there was nothing more they could do for her diarrhea and recommended continued use of the Imodium.  There is a medication that she takes at home called Librax which she will take in [place] to help with her IBS symptoms and she reports that this helped greatly.  She went to today and recently and got some but is out and would like a prescription for here. OBJECTIVE:   BP 124/76   Pulse (!) 117   Ht 5\' 5"  (1.651 m)   Wt 207 lb 6.4 oz (94.1 kg)   SpO2 100%   BMI 34.51 kg/m   General: Anxious 41 year old female in no acute distress Cardiac: Regular rate and rhythm Respiratory: Normal work of breathing Abdomen: Soft, nontender, positive bowel sounds MSK: No gross abnormalities  ASSESSMENT/PLAN:   Chronic diarrhea Patient currently taking loperamide 2 mg daily.  Reports she has been taking it for 1 year.  She has seen GI on multiple occasions including earlier this spring and had colonoscopy and EGD.  She reports she was told there is nothing else I can do to help with the diarrhea.  Discussed other treatment options and recommended she follow-up with her GI doctor to see if there was anything else I could think of to help with the diarrhea.  Will trial Bentyl.  Discussed that she should only take it a max of 4 times daily and for only 2 weeks at a time.  Patient is agreeable to this.  Strict return precautions given.     08-26[birth year]1990, MD [facility]

## 2021-07-05 NOTE — Patient Instructions (Signed)
It was a pleasure seeing you today.  I am sorry you are having issues with this diarrhea.  I have sent a prescription for a medication called Bentyl which should help with the diarrhea.  I want you to take it 4 times daily until your symptoms resolve and then do not take it if you are not having symptoms.  Do not take it for more than 2 weeks at a time.  I have sent a refill in but wait at least a month before refilling.  I do recommend you follow-up with your GI doctor to see if there is any other treatments for the IBS that he recommends.  I hope you have a wonderful day!

## 2021-07-06 NOTE — Assessment & Plan Note (Signed)
>>  ASSESSMENT AND PLAN FOR CHRONIC DIARRHEA WRITTEN ON 07/06/2021 10:09 PM BY Derrel Nip, MD  Patient currently taking loperamide 2 mg daily.  Reports she has been taking it for 1 year.  She has seen GI on multiple occasions including earlier this spring and had colonoscopy and EGD.  She reports she was told there is nothing else I can do to help with the diarrhea.  Discussed other treatment options and recommended she follow-up with her GI doctor to see if there was anything else I could think of to help with the diarrhea.  Will trial Bentyl.  Discussed that she should only take it a max of 4 times daily and for only 2 weeks at a time.  Patient is agreeable to this.  Strict return precautions given.

## 2021-07-06 NOTE — Assessment & Plan Note (Signed)
Patient currently taking loperamide 2 mg daily.  Reports she has been taking it for 1 year.  She has seen GI on multiple occasions including earlier this spring and had colonoscopy and EGD.  She reports she was told there is nothing else I can do to help with the diarrhea.  Discussed other treatment options and recommended she follow-up with her GI doctor to see if there was anything else I could think of to help with the diarrhea.  Will trial Bentyl.  Discussed that she should only take it a max of 4 times daily and for only 2 weeks at a time.  Patient is agreeable to this.  Strict return precautions given.

## 2021-07-09 ENCOUNTER — Ambulatory Visit: Payer: Medicaid Other | Admitting: Infectious Diseases

## 2021-07-09 NOTE — Progress Notes (Deleted)
Patient Active Problem List   Diagnosis Date Noted   S/P cholecystectomy 01/17/2021   C. difficile diarrhea 01/16/2021   Left foot pain 11/01/2020   Chronic diarrhea 02/25/2020   Low TSH level 09/03/2019   Type 2 diabetes mellitus with hyperglycemia ([facility]) 09/01/2019   Obesity (BMI 30-39.9) 10/31/2011   Vulvovaginal candidiasis 09/16/2006    Patient's Medications  New Prescriptions   No medications on file  Previous Medications   ACCU-CHEK SOFTCLIX LANCETS LANCETS    Use as instructed   BLOOD GLUCOSE MONITORING SUPPL (ACCU-CHEK GUIDE ME) W/DEVICE KIT    1 Units by Does not apply route every other day.   DICYCLOMINE (BENTYL) 20 MG TABLET    Take 1 tablet (20 mg total) by mouth 4 (four) times daily -  before meals and at bedtime for 14 days.   FLUCONAZOLE (DIFLUCAN) 150 MG TABLET    Take 1 tablet (150 mg total) by mouth daily.   GLUCOSE BLOOD (ACCU-CHEK GUIDE) TEST STRIP    USE AS DIRECTED   INSULIN GLARGINE (LANTUS) 100 UNIT/ML SOLOSTAR PEN    Inject 20 Units into the skin daily.   LOPERAMIDE (IMODIUM) 2 MG CAPSULE    TAKE ONE CAPSULE BY MOUTH UP TO FOUR TIMES DAILY AS NEEDED FOR DIARRHEA OR LOOSE STOOLS   PRENATAL VIT-FE FUMARATE-FA (WESTAB PLUS) 27-1 MG TABS    Take 1 tablet by mouth daily.  Modified Medications   No medications on file  Discontinued Medications   No medications on file    Subjective: ***   Review of Systems: ROS  Past Medical History:  Diagnosis Date   Acute cholecystitis 01/18/2020   Gestational diabetes    History of gestational diabetes 09/06/2016   Hypertension    pregnancy   NVD (normal vaginal delivery) 09/20/2010   Pre-existing type 2 diabetes mellitus during pregnancy in second trimester 10/02/2016   S/P cholecystectomy 01/17/2021    Social History   Tobacco Use   Smoking status: Never   Smokeless tobacco: Never  Vaping Use   Vaping Use: Never used  Substance Use Topics   Alcohol use: No   Drug use: No    Family History   Problem Relation Age of Onset   Diabetes Mother    Hypertension Mother    Diabetes Father    Hypertension Father    Diabetes Maternal Grandmother    Hypertension Maternal Grandmother    Diabetes Maternal Grandfather    Hypertension Maternal Grandfather    Diabetes Paternal Grandmother    Hypertension Paternal Grandmother    Diabetes Paternal Grandfather    Hypertension Paternal Grandfather    Cancer Neg Hx    Heart disease Neg Hx    Stroke Neg Hx     No Known Allergies  Health Maintenance  Topic Date Due   COVID-19 Vaccine (1) Never done   FOOT EXAM  Never done   OPHTHALMOLOGY EXAM  Never done   HEMOGLOBIN A1C  08/16/2021   INFLUENZA VACCINE  09/04/2021   PAP SMEAR-Modifier  09/01/2022   TETANUS/TDAP  10/02/2026   Hepatitis C Screening  Completed   HIV Screening  Completed   HPV VACCINES  Aged Out   URINE MICROALBUMIN  Discontinued    Objective:  There were no vitals filed for this visit. There is no height or weight on file to calculate BMI.  Physical Exam Constitutional:      Appearance: Normal appearance.  HENT:  Head: Normocephalic and atraumatic.      Mouth: Mucous membranes are moist.  Eyes:    Conjunctiva/sclera: Conjunctivae normal.     Pupils: Pupils are equal, round, and reactive to light.   Cardiovascular:     Rate and Rhythm: Normal rate and regular rhythm.     Heart sounds: No murmur heard. No friction rub. No gallop.   Pulmonary:     Effort: Pulmonary effort is normal.     Breath sounds: Normal breath sounds.   Abdominal:     General: Non distended     Palpations: soft.   Musculoskeletal:        General: Normal range of motion.   Skin:    General: Skin is warm and dry.     Comments:  Neurological:     General: grossly non focal     Mental Status: awake, alert and oriented to person, place, and time.   Psychiatric:        Mood and Affect: Mood normal.   Lab Results Lab Results  Component Value Date   WBC 10.0 01/24/2021    HGB 14.2 01/24/2021   HCT 42.6 01/24/2021   MCV 83.0 01/24/2021   PLT 370 01/24/2021    Lab Results  Component Value Date   CREATININE 0.61 01/24/2021   BUN 9 01/24/2021   NA 137 01/24/2021   K 4.5 01/24/2021   CL 100 01/24/2021   CO2 24 01/24/2021    Lab Results  Component Value Date   ALT 26 01/24/2021   AST 20 01/24/2021   ALKPHOS 82 01/15/2021   BILITOT 0.3 01/24/2021    Lab Results  Component Value Date   CHOL 199 02/24/2020   HDL 43 02/24/2020   LDLCALC 140 (H) 02/24/2020   LDLDIRECT 120 (H) 10/31/2011   TRIG 90 02/24/2020   CHOLHDL 4.6 (H) 02/24/2020   Lab Results  Component Value Date   LABRPR Non Reactive 03/06/2017   No results found for: HIV1RNAQUANT, HIV1RNAVL, CD4TABS   Problem List Items Addressed This Visit   None   I have personally spent more than 70 minutes involved in face-to-face and non-face-to-face activities for this patient on the day of the visit. Professional time spent includes the following activities: Preparing to see the patient (review of tests), Obtaining and/or reviewing separately obtained history (admission/discharge record), Performing a medically appropriate examination and/or evaluation , Ordering medications/tests/procedures, referring and communicating with other health care professionals, Documenting clinical information in the EMR, Independently interpreting results (not separately reported), Communicating results to the patient/family/caregiver, Counseling and educating the patient/family/caregiver and Care coordination (not separately reported).   Wilber Oliphant, [facility] 07/09/2021, 1:17 PM

## 2021-07-10 ENCOUNTER — Other Ambulatory Visit: Payer: Self-pay

## 2021-07-10 ENCOUNTER — Ambulatory Visit (INDEPENDENT_AMBULATORY_CARE_PROVIDER_SITE_OTHER): Payer: Medicaid Other | Admitting: Infectious Diseases

## 2021-07-10 ENCOUNTER — Encounter: Payer: Self-pay | Admitting: *Deleted

## 2021-07-10 VITALS — HR 95 | Temp 97.5°F | Wt 203.0 lb

## 2021-07-10 DIAGNOSIS — Z9049 Acquired absence of other specified parts of digestive tract: Secondary | ICD-10-CM

## 2021-07-10 DIAGNOSIS — K529 Noninfective gastroenteritis and colitis, unspecified: Secondary | ICD-10-CM | POA: Diagnosis not present

## 2021-07-10 DIAGNOSIS — A498 Other bacterial infections of unspecified site: Secondary | ICD-10-CM | POA: Diagnosis not present

## 2021-07-10 DIAGNOSIS — K580 Irritable bowel syndrome with diarrhea: Secondary | ICD-10-CM

## 2021-07-10 MED ORDER — LOPERAMIDE HCL 2 MG PO CAPS: ORAL_CAPSULE | ORAL | 0 refills | Status: DC

## 2021-07-10 NOTE — Progress Notes (Unsigned)
Patient Active Problem List   Diagnosis Date Noted   S/P cholecystectomy 01/17/2021   C. difficile diarrhea 01/16/2021   Left foot pain 11/01/2020   Chronic diarrhea 02/25/2020   Low TSH level 09/03/2019   Type 2 diabetes mellitus with hyperglycemia ([place]) 09/01/2019   Obesity (BMI 30-39.9) 10/31/2011   Vulvovaginal candidiasis 09/16/2006    Patient's Medications  New Prescriptions   No medications on file  Previous Medications   ACCU-CHEK SOFTCLIX LANCETS LANCETS    Use as instructed   BLOOD GLUCOSE MONITORING SUPPL (ACCU-CHEK GUIDE ME) W/DEVICE KIT    1 Units by Does not apply route every other day.   DICYCLOMINE (BENTYL) 20 MG TABLET    Take 1 tablet (20 mg total) by mouth 4 (four) times daily -  before meals and at bedtime for 14 days.   FLUCONAZOLE (DIFLUCAN) 150 MG TABLET    Take 1 tablet (150 mg total) by mouth daily.   GLUCOSE BLOOD (ACCU-CHEK GUIDE) TEST STRIP    USE AS DIRECTED   INSULIN GLARGINE (LANTUS) 100 UNIT/ML SOLOSTAR PEN    Inject 20 Units into the skin daily.   LOPERAMIDE (IMODIUM) 2 MG CAPSULE    TAKE ONE CAPSULE BY MOUTH UP TO FOUR TIMES DAILY AS NEEDED FOR DIARRHEA OR LOOSE STOOLS   PRENATAL VIT-FE FUMARATE-FA (WESTAB PLUS) 27-1 MG TABS    Take 1 tablet by mouth daily.  Modified Medications   No medications on file  Discontinued Medications   No medications on file    Subjective: 41 Y O female from [place] with prior h/o IBS and on tx while in [place], h/o C diff, HTN, DM, s/p cholecystectomy who is here for chronic diarrhea. She is accompanied by her daughter and does not want an interpreter.  She had cholecystectomy in 01/2020 followed by worsening diarrhea few months later. She was seen in 02/2020 and was given cholestyramine at the UC but did not take the medication. She instead decreased the dose of metformin due to concerns for side effect of metformin which somewhat helped the diarrhea. It seems she was also having diarrhea prior to cholecystectomy  due to IBS and was on medication for it while in [place] but it got worse after the cholecystectomy. She was using loperamide during PCP visit in June and July 2023. She was seen in the ED in 12/17/2020 for dental pain where she was given clindamycin. This was followed by another ED visit 12/25/20 with worsening diarrhea. She was discharged on lomotil although it was advised to get stool sample for c diff and GI PCR. She was seen again in the ED 11/24 with minimal relief of diarrhea on lomotil. 11/24 Toxigenic C diff PCR positive but no toxin production. She was prescribed Vancomycin 144m po 4 times daily for 10 days. She denies any improvement of diarrhea with PO Vancomycin. PO Vancomycin later extended to 14 days course and referred to ID. Seen by Dr VTommy Medal12/14/22 and was prescribed Fidaxomicin for 10 days. She was still having diarrhea on fu visit with Dr SCandiss Norseon 01/24/21 and plan was made for Zinplava and another round of fidaxomicin. She was again seen in the ED 12/26 for diarrhea and was requesting antidiarrheals and was referred to GI. She decided not to do zinplava on her own due to concern for side effects. On fu visit 12/30, she has some improvement in diarrhea. She was also seen by GI around that time and was started on  dicyclomine, hyoscamine, coletipol. She tells me she had a colonoscopy with [facility] couple of months ago which was WNL ( I do not have reports ).  She tells me she was born in [place] and moved to [place] approx 22-23 years ago. She has 5 children and youngest is 4 years. 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 She is taking loperamide every day for last 1 year. She tells if she does not take it, she gets 10-12 episodes of stool. Loperamide helps it to decrease the frequency to 3-4 times a day. She did not have any any improvement in diarrhea while taking PO Vancomycin and PO fidaxomicin. Stool is watery, no blood, no fevers and chills, occurs  every time she eats.   Denies smoking, alcohol and IVDU. No pets   Review of Systems: all systems reviewed with pertinent positives and negatives as listed above  Past Medical History:  Diagnosis Date   Acute cholecystitis 01/18/2020   Gestational diabetes    History of gestational diabetes 09/06/2016   Hypertension    pregnancy   NVD (normal vaginal delivery) 09/20/2010   Pre-existing type 2 diabetes mellitus during pregnancy in second trimester 10/02/2016   S/P cholecystectomy 01/17/2021   Past Surgical History:  Procedure Laterality Date   CHOLECYSTECTOMY N/A 01/19/2020   Procedure: LAPAROSCOPIC CHOLECYSTECTOMY;  Surgeon: Stark Klein, MD;  Location: [facility];  Service: General;  Laterality: N/A;   NO PAST SURGERIES      Social History   Tobacco Use   Smoking status: Never   Smokeless tobacco: Never  Vaping Use   Vaping Use: Never used  Substance Use Topics   Alcohol use: No   Drug use: No    Family History  Problem Relation Age of Onset   Diabetes Mother    Hypertension Mother    Diabetes Father    Hypertension Father    Diabetes Maternal Grandmother    Hypertension Maternal Grandmother    Diabetes Maternal Grandfather    Hypertension Maternal Grandfather    Diabetes Paternal Grandmother    Hypertension Paternal Grandmother    Diabetes Paternal Grandfather    Hypertension Paternal Grandfather    Cancer Neg Hx    Heart disease Neg Hx    Stroke Neg Hx     No Known Allergies  Health Maintenance  Topic Date Due   COVID-19 Vaccine (1) Never done   FOOT EXAM  Never done   OPHTHALMOLOGY EXAM  Never done   HEMOGLOBIN A1C  08/16/2021   INFLUENZA VACCINE  09/04/2021   PAP SMEAR-Modifier  09/01/2022   TETANUS/TDAP  10/02/2026   Hepatitis C Screening  Completed   HIV Screening  Completed   HPV VACCINES  Aged Out   URINE MICROALBUMIN  Discontinued    Objective:  There were no vitals filed for this visit. There is no height or weight on file to calculate  BMI.  Physical Exam Constitutional:      Appearance: Normal appearance.  HENT:     Head: Normocephalic and atraumatic.      Mouth: Mucous membranes are moist.  Eyes:    Conjunctiva/sclera: Conjunctivae normal.     Pupils: Pupils are equal, round, and reactive to light.   Cardiovascular:     Rate and Rhythm: Normal rate and regular rhythm.     Heart sounds: No murmur heard. No friction rub. No gallop.   Pulmonary:     Effort: Pulmonary effort is  normal.     Breath sounds: Normal breath sounds.   Abdominal:     General: Non distended     Palpations: soft. Non tender   Musculoskeletal:        General: Normal range of motion.   Skin:    General: Skin is warm and dry.     Comments:  Neurological:     General: grossly non focal     Mental Status: awake, alert and oriented to person, place, and time.   Psychiatric:        Mood and Affect: Mood normal.   Lab Results Lab Results  Component Value Date   WBC 10.0 01/24/2021   HGB 14.2 01/24/2021   HCT 42.6 01/24/2021   MCV 83.0 01/24/2021   PLT 370 01/24/2021    Lab Results  Component Value Date   CREATININE 0.61 01/24/2021   BUN 9 01/24/2021   NA 137 01/24/2021   K 4.5 01/24/2021   CL 100 01/24/2021   CO2 24 01/24/2021    Lab Results  Component Value Date   ALT 26 01/24/2021   AST 20 01/24/2021   ALKPHOS 82 01/15/2021   BILITOT 0.3 01/24/2021    Lab Results  Component Value Date   CHOL 199 02/24/2020   HDL 43 02/24/2020   LDLCALC 140 (H) 02/24/2020   LDLDIRECT 120 (H) 10/31/2011   TRIG 90 02/24/2020   CHOLHDL 4.6 (H) 02/24/2020   Lab Results  Component Value Date   LABRPR Non Reactive 03/06/2017   No results found for: HIV1RNAQUANT, HIV1RNAVL, CD4TABS   Problem List Items Addressed This Visit       Digestive   Chronic diarrhea - Primary   Relevant Orders   Ova and parasite examination   Giardia antigen   C. difficile GDH and Toxin A/B   Ova and parasite examination   Ova and parasite  examination   Gastrointestinal Pathogen Pnl RT, PCR   Irritable bowel syndrome with diarrhea     Other   S/P cholecystectomy   Relevant Orders   Gastrointestinal Pathogen Pnl RT, PCR   Clostridioides difficile infection    Assessment/Plan 73 Y O Female from [place] with prior h/o IBS on medications, s/p cholecystectomy, HTN, DM and positive toxigenic C diff PCR who is here for evaluation of chronic diarrhea.   Although she tested positive for toxigenic C diff via PCR in 12/2020, there was NO  toxin production and not conclusive of diarrhea being due to C diff entirely. She has been having chronic diarrhea due to IBS while she was in [place] which has worsened lately after cholecystectomy. She tells me very clearly that neither PO Vancomycin nor PO fidaxomicin has helped reduced the frequency of diarrhea as much as loperamide and hence, she has been taking it for a year although she was advised not to take it due to + C diff test. I would have expected she would have gotten worse taking antidiarrheals if she were to have active C diff. She needs a proper GI evaluation for her chronic diarrhea due to IBS vs cholecystectomy vs others.   Will repeat C diff test, GDH antigen toxin A/B instead of PCR as PCR are overtly sensitive and can be positive with C diff colonization even without true infection leading to over diagnosis and tx of C diff especially in patients with alternative causes of diarrhea   She does not want to do any blood tests, ( eosinophilia in the past). Will get stool ova and  parasite*3, Giardia ag, strongyloides ab, GI PCR Fu to be made pending above tests  I have personally spent 70 minutes involved in face-to-face and non-face-to-face activities for this patient on the day of the visit. Professional time spent includes the following activities: Preparing to see the patient (review of tests), Obtaining and/or reviewing separately obtained history (admission/discharge record),  Performing a medically appropriate examination and/or evaluation , Ordering medications/tests/procedures, referring and communicating with other health care professionals, Documenting clinical information in the EMR, Independently interpreting results (not separately reported), Communicating results to the patient/family/caregiver, Counseling and educating the patient/family/caregiver and Care coordination (not separately reported).   Wilber Oliphant, [facility] 07/10/2021, 8:16 AM

## 2021-07-11 ENCOUNTER — Telehealth: Payer: Self-pay

## 2021-07-11 DIAGNOSIS — A498 Other bacterial infections of unspecified site: Secondary | ICD-10-CM | POA: Insufficient documentation

## 2021-07-11 DIAGNOSIS — K580 Irritable bowel syndrome with diarrhea: Secondary | ICD-10-CM | POA: Insufficient documentation

## 2021-07-11 NOTE — Telephone Encounter (Signed)
Per MD called [facility] [phone]) to request a copy of patients most recent colonoscopy report. No answer left voice message.

## 2021-07-12 ENCOUNTER — Telehealth: Payer: Self-pay

## 2021-07-12 LAB — GASTROINTESTINAL PATHOGEN PNL
CampyloBacter Group: NOT DETECTED
Norovirus GI/GII: NOT DETECTED
Rotavirus A: NOT DETECTED
Salmonella species: NOT DETECTED
Shiga Toxin 1: NOT DETECTED
Shiga Toxin 2: NOT DETECTED
Shigella Species: NOT DETECTED
Vibrio Group: NOT DETECTED
Yersinia enterocolitica: NOT DETECTED

## 2021-07-12 NOTE — Telephone Encounter (Signed)
-----   Message from Odette Fraction, MD sent at 07/12/2021  4:04 PM EDT ----- Please let her know that she has tested negative for C diff this time. Low suspicion her diarrhea being due to C diff. She needs to follow up with GI for proper evaluation and management of chronic diarrhea which could be due to IBS vs others. No restriction to use loperamide from ID standpoint.

## 2021-07-12 NOTE — Telephone Encounter (Signed)
Spoke with patient, relayed that her test was negative for c.diff. advised her she needs to follow up with GI for further evaluation.   She is asking that Dr. West Bali send in a refill of the loperamide to [facility] on [address]. Advised her this can be purchased over the counter, she states it is cheaper with a prescription. Will route to provider.   Beryle Flock, RN

## 2021-07-13 ENCOUNTER — Other Ambulatory Visit: Payer: Self-pay

## 2021-07-13 DIAGNOSIS — Z9049 Acquired absence of other specified parts of digestive tract: Secondary | ICD-10-CM

## 2021-07-13 MED ORDER — LOPERAMIDE HCL 2 MG PO CAPS: ORAL_CAPSULE | ORAL | 0 refills | Status: DC

## 2021-07-13 NOTE — Telephone Encounter (Signed)
Please send one refill of loperamide. Rest of the refills to be done by PCP or GI. Thx.

## 2021-07-13 NOTE — Telephone Encounter (Signed)
Called patient and communicated that loperamide refill was sent. Explained to patient that future refills should go through PCP or GI. Patient stated understanding and had no additional questions.  Wyvonne Lenz, RN

## 2021-07-16 ENCOUNTER — Encounter (HOSPITAL_COMMUNITY): Payer: Self-pay

## 2021-07-16 ENCOUNTER — Ambulatory Visit (HOSPITAL_COMMUNITY)
Admission: EM | Admit: 2021-07-16 | Discharge: 2021-07-16 | Disposition: A | Discharge status: Home / Self Care | Payer: Medicaid Other | Attending: Emergency Medicine | Admitting: Emergency Medicine

## 2021-07-16 DIAGNOSIS — Z30432 Encounter for removal of intrauterine contraceptive device: Secondary | ICD-10-CM

## 2021-07-16 NOTE — ED Triage Notes (Signed)
Pt reports severe menstrual cramping x 2 days with a small amount of bleeding.  Feels cramps are so severe d/t Mirena needing to come out.

## 2021-07-16 NOTE — ED Provider Notes (Signed)
[facility]    CSN: [id number] Arrival date & time: 07/16/21  1819     History   Chief Complaint Chief Complaint  Patient presents with   Abdominal Cramping    menstrual    HPI Elizabeth Warren is a 41 y.o. female.  Presents with 2-day history of lower abdominal cramping.  Has had some spotting.  States she can feel her IUD lower in her vaginal canal.  She has had these symptoms before and it was due to her Mirena being in the wrong place.  Last menstrual cycle was last week.  She is requesting removal of the IUD today. Denies heavy bleeding or large clots.  No other abdominal pain.  Past Medical History:  Diagnosis Date   Acute cholecystitis 01/18/2020   Gestational diabetes    History of gestational diabetes 09/06/2016   Hypertension    pregnancy   NVD (normal vaginal delivery) 09/20/2010   Pre-existing type 2 diabetes mellitus during pregnancy in second trimester 10/02/2016   S/P cholecystectomy 01/17/2021    Patient Active Problem List   Diagnosis Date Noted   Irritable bowel syndrome with diarrhea 07/11/2021   Clostridioides difficile infection 07/11/2021   S/P cholecystectomy 01/17/2021   Left foot pain 11/01/2020   Chronic diarrhea 02/25/2020   Low TSH level 09/03/2019   Type 2 diabetes mellitus with hyperglycemia ([place]) 09/01/2019   Obesity (BMI 30-39.9) 10/31/2011   Vulvovaginal candidiasis 09/16/2006    Past Surgical History:  Procedure Laterality Date   CHOLECYSTECTOMY N/A 01/19/2020   Procedure: LAPAROSCOPIC CHOLECYSTECTOMY;  Surgeon: Stark Klein, MD;  Location: [facility];  Service: General;  Laterality: N/A;   NO PAST SURGERIES      OB History     Gravida  5   Para  5   Term  5   Preterm  0   AB  0   Living  5      SAB  0   IAB  0   Ectopic  0   Multiple  0   Live Births  5            Home Medications    Prior to Admission medications   Medication Sig Start Date End Date Taking? Authorizing Provider  Accu-Chek  Softclix Lancets lancets Use as instructed 11/15/20  Yes Simmons-Robinson, Makiera, MD  Blood Glucose Monitoring Suppl (ACCU-CHEK GUIDE ME) w/Device KIT 1 Units by Does not apply route every other day. 09/03/19  Yes Gifford Shave, MD  dicyclomine (BENTYL) 20 MG tablet Take 1 tablet (20 mg total) by mouth 4 (four) times daily -  before meals and at bedtime for 14 days. 07/05/21 07/19/21 Yes Gifford Shave, MD  glucose blood (ACCU-CHEK GUIDE) test strip USE AS DIRECTED 11/15/20  Yes Simmons-Robinson, Makiera, MD  insulin glargine (LANTUS) 100 UNIT/ML Solostar Pen Inject 20 Units into the skin daily. 07/05/21  Yes Gifford Shave, MD  loperamide (IMODIUM) 2 MG capsule TAKE ONE CAPSULE BY MOUTH UP TO FOUR TIMES DAILY AS NEEDED FOR DIARRHEA OR LOOSE STOOLS 07/13/21  Yes Manandhar, Collene Mares, MD  Prenatal Vit-Fe Fumarate-FA (WESTAB PLUS) 27-1 MG TABS Take 1 tablet by mouth daily. 07/05/21  Yes Gifford Shave, MD  fluconazole (DIFLUCAN) 150 MG tablet Take 1 tablet (150 mg total) by mouth daily. 04/10/21   Eppie Gibson, MD    Family History Family History  Problem Relation Age of Onset   Diabetes Mother    Hypertension Mother    Diabetes Father  Hypertension Father    Diabetes Maternal Grandmother    Hypertension Maternal Grandmother    Diabetes Maternal Grandfather    Hypertension Maternal Grandfather    Diabetes Paternal Grandmother    Hypertension Paternal Grandmother    Diabetes Paternal Grandfather    Hypertension Paternal Grandfather    Cancer Neg Hx    Heart disease Neg Hx    Stroke Neg Hx     Social History Social History   Tobacco Use   Smoking status: Never   Smokeless tobacco: Never  Vaping Use   Vaping Use: Never used  Substance Use Topics   Alcohol use: No   Drug use: No     Allergies   Patient has no known allergies.   Review of Systems Review of Systems Per HPI  Physical Exam Triage Vital Signs ED Triage Vitals  Enc Vitals Group     BP 07/16/21 1949 (!)  165/70     Pulse Rate 07/16/21 1949 84     Resp 07/16/21 1949 20     Temp 07/16/21 1949 98.3 F (36.8 C)     Temp Source 07/16/21 1949 Oral     SpO2 07/16/21 1949 99 %     Weight --      Height --      Head Circumference --      Peak Flow --      Pain Score 07/16/21 1945 10     Pain Loc --      Pain Edu? --      Excl. in [place]? --    No data found.  Updated Vital Signs BP (!) 165/70 (BP Location: Right Arm)   Pulse 84   Temp 98.3 F (36.8 C) (Oral)   Resp 20   SpO2 99%    Physical Exam Vitals and nursing note reviewed. Exam conducted with a chaperone present.  Constitutional:      Appearance: Normal appearance.  HENT:     Mouth/Throat:     Pharynx: Oropharynx is clear.  Eyes:     Conjunctiva/sclera: Conjunctivae normal.  Cardiovascular:     Rate and Rhythm: Normal rate and regular rhythm.     Heart sounds: Normal heart sounds.  Pulmonary:     Effort: Pulmonary effort is normal.     Breath sounds: Normal breath sounds.  Abdominal:     General: Bowel sounds are normal.     Tenderness: There is no abdominal tenderness. There is no guarding.  Genitourinary:    Vagina: Bleeding present.     Cervix: Normal.     Comments: End of IUD visualized at cervical opening. Small amount of blood in vaginal canal Neurological:     Mental Status: She is alert and oriented to person, place, and time.     UC Treatments / Results  Labs (all labs ordered are listed, but only abnormal results are displayed) Labs Reviewed - No data to display  EKG  Radiology No results found.  Procedures Foreign Body Removal  Date/Time: 07/16/2021 8:41 PM  Performed by: Les Pou, PA-C Authorized by: Les Pou, PA-C   Consent:    Consent obtained:  Verbal   Consent given by:  Patient Location:    Location:  Anogenital   Anogenital location: cervix. Anesthesia:    Anesthesia method:  None Procedure type:    Procedure complexity:  Simple Procedure details:     Localization method:  Visualized   Removal mechanism:  Hemostat   Foreign bodies recovered:  1  Description:  IUD   Intact foreign body removal: yes   Post-procedure details:    Procedure completion:  Tolerated  Chaperone present   Medications Ordered in UC Medications - No data to display  Initial Impression / Assessment and Plan / UC Course  I have reviewed the triage vital signs and the nursing notes.  Pertinent labs & imaging results that were available during my care of the patient were reviewed by me and considered in my medical decision making (see chart for details).  IUD removed after two attempts, patient tolerated procedure well. She will use ibuprofen or Tylenol if she has continued pain.  I also recommend hot pad to the lower abdomen.  She will be contacting her primary care provider in the morning to make an appointment for follow-up. Return precautions discussed. Patient agrees to plan and is discharged in stable condition.  Final Clinical Impressions(s) / UC Diagnoses   Final diagnoses:  Encounter for IUD removal     Discharge Instructions      Take ibuprofen or tylenol for pain.  Follow up with your Ob/GYN or primary care provider!     ED Prescriptions   None    PDMP not reviewed this encounter.   Sabatino Williard, Vernice Jefferson 07/16/21 2120

## 2021-07-16 NOTE — Discharge Instructions (Signed)
Take ibuprofen or tylenol for pain.  Follow up with your Ob/GYN or primary care provider!

## 2021-07-17 LAB — GIARDIA ANTIGEN
MICRO NUMBER:: 13490069
RESULT:: NOT DETECTED
SPECIMEN QUALITY:: ADEQUATE

## 2021-07-17 LAB — C. DIFFICILE GDH AND TOXIN A/B
GDH ANTIGEN: DETECTED
MICRO NUMBER:: 13490824
SPECIMEN QUALITY:: ADEQUATE
TOXIN A AND B: NOT DETECTED

## 2021-07-17 LAB — OVA AND PARASITE EXAMINATION
CONCENTRATE RESULT:: NONE SEEN
MICRO NUMBER:: 13490079
SPECIMEN QUALITY:: ADEQUATE
TRICHROME RESULT:: NONE SEEN

## 2021-07-17 LAB — CLOSTRIDIUM DIFFICILE TOXIN B, QUALITATIVE, REAL-TIME PCR: Toxigenic C. Difficile by PCR: NOT DETECTED

## 2021-07-18 ENCOUNTER — Ambulatory Visit: Payer: Medicaid Other | Admitting: Family Medicine

## 2021-08-01 ENCOUNTER — Other Ambulatory Visit (HOSPITAL_COMMUNITY): Payer: Self-pay

## 2021-08-13 ENCOUNTER — Encounter: Payer: Self-pay | Admitting: Family Medicine

## 2021-08-13 ENCOUNTER — Ambulatory Visit (INDEPENDENT_AMBULATORY_CARE_PROVIDER_SITE_OTHER): Payer: Medicaid Other | Admitting: Family Medicine

## 2021-08-13 DIAGNOSIS — Z9049 Acquired absence of other specified parts of digestive tract: Secondary | ICD-10-CM | POA: Diagnosis not present

## 2021-08-13 MED ORDER — WESTAB PLUS 27-1 MG PO TABS: 1.0000 | ORAL_TABLET | Freq: Every day | ORAL | 2 refills | Status: DC

## 2021-08-13 MED ORDER — LOPERAMIDE HCL 2 MG PO CAPS: ORAL_CAPSULE | ORAL | 0 refills | Status: DC

## 2021-08-13 MED ORDER — NAPROXEN 500 MG PO TABS: 500.0000 mg | ORAL_TABLET | Freq: Two times a day (BID) | ORAL | 0 refills | Status: DC

## 2021-08-13 NOTE — Patient Instructions (Signed)
It was wonderful to see you today.  Please bring ALL of your medications with you to every visit.   Today we talked about:  - I sent in naproxen for your cough. You CANNOT take this with ibuprofen. Stop the ibuprofen.  - You can take tylenol - I sent in loperamide  - I recommend honey for your cough  Please return if you develop difficulty breathing, worsening symptoms, or if your symptoms do not improve by the end of the week   Please follow up in 1 months   Thank you for choosing [facility].   Please call [phone] with any questions about today's appointment.  Please be sure to schedule follow up at the front  desk before you leave today.   Terisa Starr, MD  Family Medicine

## 2021-08-13 NOTE — Progress Notes (Signed)
    SUBJECTIVE:   CHIEF COMPLAINT: Nasal congestion and body aches HPI:   Elizabeth Warren is a 41 y.o.  with history notable for type 2 diabetes presenting for viral illness.   The patient reports that on Friday she had the onset of cough congestion body aches.  She had a high fever over the weekend.  She reports that her symptoms are actually improving.  She has been taking ibuprofen and Tylenol.  Her most bothersome symptom is cough.  She denies chest pain dyspnea or ongoing fevers.  She reports now the rest of her family is sick.  She is worried about the flu.  She has not had the COVID-vaccine.  She is not interested in being tested for COVID.  She denies chest pain, dyspnea, nausea, vomiting, change in diarrhea.  In terms of the patient's diabetes she is aware she is due for follow-up.  Her sugars have been running in the 140s to 160s.  She denies nausea or vomiting.  The patient has a history of diarrhea after cholecystectomy.  She has been using loperamide and requests refill for this today. PERTINENT  PMH / PSH/Family/Social History : Reviewed, she is due for A1c, she request follow-up for this rather than checking it today.  OBJECTIVE:   BP 106/73   Pulse 93   Wt 201 lb 12.8 oz (91.5 kg)   LMP 08/13/2021   SpO2 100%   BMI 33.58 kg/m   Today's weight:  Last Weight  Most recent update: 08/13/2021  9:54 AM    Weight  91.5 kg (201 lb 12.8 oz)            Review of prior weights: [employer]   08/13/21 0953  Weight: 201 lb 12.8 oz (91.5 kg)   Pleasant appearing woman in no distress HEENT exam tympanic membrane's are unremarkable. Oropharynx has postnasal drip.  Nasal turbinates appear somewhat congested and erythematous.  There is no adenopathy.  Cardiac exam is regular rate and rhythm lungs clear bilaterally.  ASSESSMENT/PLAN:   Viral URI low suspicion for pneumonia given normal exam and improving symptoms.  Do not suspect bacterial cause given improving nature and  short course. Discussed appropriate care for viral illness including nasal saline, humidified air, honey, drinking fluids, Tylenol, and Naproxen. Reviewed return precautions  Post Cholecystectomy Diarrhea, suspect this is because but did not see that she had a colonoscopy.  Reviewed that routine use of loperamide is not recommended.  Refill given.  Recommended using it once a day or less.    Follow-up as scheduled for her diabetes.    Terisa Starr, MD  Family Medicine Teaching Service  [facility]

## 2021-08-14 ENCOUNTER — Ambulatory Visit: Payer: Medicaid Other

## 2021-08-22 ENCOUNTER — Encounter: Payer: Self-pay | Admitting: Student

## 2021-08-22 ENCOUNTER — Ambulatory Visit (INDEPENDENT_AMBULATORY_CARE_PROVIDER_SITE_OTHER): Payer: Medicaid Other | Admitting: Student

## 2021-08-22 VITALS — BP 120/72 | HR 80 | Wt 202.0 lb

## 2021-08-22 DIAGNOSIS — E1165 Type 2 diabetes mellitus with hyperglycemia: Secondary | ICD-10-CM

## 2021-08-22 DIAGNOSIS — Z9049 Acquired absence of other specified parts of digestive tract: Secondary | ICD-10-CM

## 2021-08-22 LAB — POCT GLYCOSYLATED HEMOGLOBIN (HGB A1C): HbA1c, POC (controlled diabetic range): 8.2 % — AB (ref 0.0–7.0)

## 2021-08-22 MED ORDER — LOPERAMIDE HCL 2 MG PO CAPS: ORAL_CAPSULE | ORAL | 0 refills | Status: DC

## 2021-08-22 MED ORDER — INSULIN GLARGINE 100 UNIT/ML SOLOSTAR PEN: 20.0000 [IU] | PEN_INJECTOR | Freq: Every day | SUBCUTANEOUS | 4 refills | Status: DC

## 2021-08-22 MED ORDER — ACCU-CHEK GUIDE VI STRP: ORAL_STRIP | 3 refills | Status: DC

## 2021-08-22 NOTE — Patient Instructions (Addendum)
It was great to see you! Thank you for allowing me to participate in your care!  I recommend that you always bring your medications to each appointment as this makes it easy to ensure you are on the correct medications and helps [place] not miss when refills are needed.  Our plans for today:  -Based on your A1c and fasting sugars in the 160's I recommend taking 20 Units of Lantus daily.  - Return in 3 months to check your hemoglobin A1C  Take care and seek immediate care sooner if you develop any concerns.   Dr. Erick Alley, DO [facility]

## 2021-08-22 NOTE — Assessment & Plan Note (Addendum)
Advised patient to take the 20 units of Lantus prescribed versus the 15 units.  Also provided her with information regarding diabetes and nutrition.  Patient declined a referral for diabetic nutrition at this time.  She is advised to return in 3 months for next A1c check.

## 2021-08-22 NOTE — Progress Notes (Signed)
    SUBJECTIVE:   CHIEF COMPLAINT / HPI:   T2DM Last HgbA1C on 02/16/21 of 8.2. Pt currently takes Lantus 15 U daily.  She was previously taking 20 units daily but states because her daily fasting sugars were running in the 160s-170s that she took it down to 15. Hgb A1c today of 8.2.  Patient states that she likes Lantus.  Has not had side effects from it.  Did not tolerate metformin due to GI side effects.  She would like to continue on Lantus. She does eats 5 tea biscuits with her tea days. Eats other sweets maybe once-a few x/week.  Tries to avoid rice but does eat bread regularly.  PERTINENT  PMH / PSH: T2DM  OBJECTIVE:   Vitals:   08/22/21 0910  BP: 120/72  Pulse: 80     General: NAD, pleasant, able to participate in exam Cardiac: RRR, no murmurs. Respiratory: CTAB, normal effort, No wheezes, rales or rhonchi Neuro: alert, no obvious focal deficits Psych: Normal affect and mood  ASSESSMENT/PLAN:   Type 2 diabetes mellitus with hyperglycemia (HCC) Advised patient to take the 20 units of Lantus prescribed versus the 15 units.  Also provided her with information regarding diabetes and nutrition.  Patient declined a referral for diabetic nutrition at this time.  She return in 3 months for next A1c check.     Dr. Erick Alley, DO [facility]

## 2021-09-10 ENCOUNTER — Other Ambulatory Visit: Payer: Self-pay | Admitting: Student

## 2021-09-10 DIAGNOSIS — Z9049 Acquired absence of other specified parts of digestive tract: Secondary | ICD-10-CM

## 2021-09-29 ENCOUNTER — Emergency Department (HOSPITAL_BASED_OUTPATIENT_CLINIC_OR_DEPARTMENT_OTHER)
Admission: EM | Admit: 2021-09-29 | Discharge: 2021-09-29 | Discharge status: Against Medical Advice | Payer: Medicaid Other

## 2021-09-29 ENCOUNTER — Other Ambulatory Visit: Payer: Self-pay

## 2021-09-29 ENCOUNTER — Encounter (HOSPITAL_COMMUNITY): Payer: Self-pay | Admitting: *Deleted

## 2021-09-29 ENCOUNTER — Ambulatory Visit (HOSPITAL_COMMUNITY)
Admission: EM | Admit: 2021-09-29 | Discharge: 2021-09-29 | Disposition: A | Discharge status: Home / Self Care | Payer: Medicaid Other | Attending: Family Medicine | Admitting: Family Medicine

## 2021-09-29 DIAGNOSIS — Z3201 Encounter for pregnancy test, result positive: Secondary | ICD-10-CM | POA: Diagnosis not present

## 2021-09-29 LAB — POCT URINALYSIS DIPSTICK, ED / UC
Bilirubin Urine: NEGATIVE
Glucose, UA: NEGATIVE mg/dL
Ketones, ur: NEGATIVE mg/dL
Leukocytes,Ua: NEGATIVE
Nitrite: NEGATIVE
Protein, ur: 30 mg/dL — AB
Specific Gravity, Urine: 1.025 (ref 1.005–1.030)
Urobilinogen, UA: 0.2 mg/dL (ref 0.0–1.0)
pH: 6 (ref 5.0–8.0)

## 2021-09-29 LAB — POC URINE PREG, ED: Preg Test, Ur: POSITIVE — AB

## 2021-09-29 MED ORDER — VITAMIN B-6 25 MG PO TABS: 25.0000 mg | ORAL_TABLET | Freq: Every day | ORAL | 2 refills | Status: DC

## 2021-09-29 NOTE — ED Notes (Signed)
Patient has been called Several times for Triage with no response. Patient not visualized in ED Waiting Area.

## 2021-09-29 NOTE — ED Triage Notes (Addendum)
Pt reports low back pain and abd cramping. Reports being late on menstrual period and feels she may be pregnant. C/O loss of appetite and breast tenderness. States loss of appetite started last week

## 2021-10-01 NOTE — ED Provider Notes (Signed)
[facility]   [id number] 09/29/21 Arrival Time: 1749  ASSESSMENT & PLAN:  1. Positive pregnancy test    Copy of test printed for patient. Benign abdominal exam.  Meds ordered this encounter  Medications   pyridOXINE (VITAMIN B6) 25 MG tablet    Sig: Take 1 tablet (25 mg total) by mouth daily.    Dispense:  30 tablet    Refill:  2     Follow-up Information     Schedule an appointment as soon as possible for a visit  with [facility].   Specialty: Obstetrics and Gynecology Contact information: [address] [place] [address] [phone]               Reviewed expectations re: course of current medical issues. Questions answered. Outlined signs and symptoms indicating need for more acute intervention. Patient verbalized understanding. After Visit Summary given.   SUBJECTIVE: History from: patient. Elizabeth Warren is a 41 y.o. female who reports occasional low back discomfort and abd cramping; none currently. Reports being late on menstrual period and feels she may be pregnant. C/O loss of appetite and breast tenderness. Otherwise well. Mild nausea today. States loss of appetite started last week.  Patient's last menstrual period was 08/29/2021 (exact date).  Past Surgical History:  Procedure Laterality Date   CHOLECYSTECTOMY N/A 01/19/2020   Procedure: LAPAROSCOPIC CHOLECYSTECTOMY;  Surgeon: Almond Lint, MD;  Location: [facility];  Service: General;  Laterality: N/A;     OBJECTIVE:  Vitals:   09/29/21 1847  BP: 130/84  Pulse: (!) 102  Resp: 16  Temp: 98.6 F (37 C)  SpO2: 98%    Recheck HR: 94 General appearance: alert, oriented, no acute distress HEENT: [place]; AT; oropharynx moist Lungs: unlabored respirations Abdomen: soft; without distention; no specific tenderness to palpation; normal bowel sounds; without masses or organomegaly; without guarding or rebound  tenderness Back: without reported CVA tenderness; FROM at waist Extremities: without LE edema; symmetrical; without gross deformities Skin: warm and dry Neurologic: normal gait Psychological: alert and cooperative; normal mood and affect  Labs: Results for orders placed or performed during the hospital encounter of 09/29/21  POC urine pregnancy  Result Value Ref Range   Preg Test, Ur POSITIVE (A) NEGATIVE  POC Urinalysis dipstick  Result Value Ref Range   Glucose, UA NEGATIVE NEGATIVE mg/dL   Bilirubin Urine NEGATIVE NEGATIVE   Ketones, ur NEGATIVE NEGATIVE mg/dL   Specific Gravity, Urine 1.025 1.005 - 1.030   Hgb urine dipstick TRACE (A) NEGATIVE   pH 6.0 5.0 - 8.0   Protein, ur 30 (A) NEGATIVE mg/dL   Urobilinogen, UA 0.2 0.0 - 1.0 mg/dL   Nitrite NEGATIVE NEGATIVE   Leukocytes,Ua NEGATIVE NEGATIVE   Labs Reviewed  POC URINE PREG, ED - Abnormal; Notable for the following components:      Result Value   Preg Test, Ur POSITIVE (*)    All other components within normal limits  POCT URINALYSIS DIPSTICK, ED / UC - Abnormal; Notable for the following components:   Hgb urine dipstick TRACE (*)    Protein, ur 30 (*)    All other components within normal limits    No Known Allergies  Past Medical History:  Diagnosis Date   Acute cholecystitis 01/18/2020   Gestational diabetes    History of gestational diabetes 09/06/2016   Hypertension    pregnancy   NVD (normal vaginal delivery) 09/20/2010   Pre-existing type 2 diabetes mellitus during pregnancy in second trimester 10/02/2016   S/P cholecystectomy 01/17/2021    Social History   Socioeconomic History   Marital status: Married    Spouse name: Not on file   Number of children: Not on file   Years of education: Not on file   Highest education level: Not on file  Occupational History   Not on file  Tobacco Use   Smoking status: Never   Smokeless tobacco: Never  Vaping Use    Vaping Use: Never used  Substance and Sexual Activity   Alcohol use: No   Drug use: No   Sexual activity: Yes  Other Topics Concern   Not on file  Social History Narrative   Not on file   Social Determinants of Health   Financial Resource Strain: Not on file  Food Insecurity: Not on file  Transportation Needs: Not on file  Physical Activity: Not on file  Stress: Not on file  Social Connections: Not on file  Intimate Partner Violence: Not on file    Family History  Problem Relation Age of Onset   Diabetes Mother    Hypertension Mother    Diabetes Father    Hypertension Father    Diabetes Maternal Grandmother    Hypertension Maternal Grandmother    Diabetes Maternal Grandfather    Hypertension Maternal Grandfather    Diabetes Paternal Grandmother    Hypertension Paternal Grandmother    Diabetes Paternal Grandfather    Hypertension Paternal Grandfather    Cancer Neg Hx    Heart disease Neg Hx    Stroke Neg Hx      Mardella Layman, MD 10/01/21 [phone]

## 2021-10-04 DIAGNOSIS — Z331 Pregnant state, incidental: Secondary | ICD-10-CM | POA: Diagnosis not present

## 2021-10-04 DIAGNOSIS — R197 Diarrhea, unspecified: Secondary | ICD-10-CM | POA: Diagnosis not present

## 2021-10-04 DIAGNOSIS — E118 Type 2 diabetes mellitus with unspecified complications: Secondary | ICD-10-CM | POA: Diagnosis not present

## 2021-10-08 ENCOUNTER — Emergency Department (HOSPITAL_BASED_OUTPATIENT_CLINIC_OR_DEPARTMENT_OTHER)
Admission: EM | Admit: 2021-10-08 | Discharge: 2021-10-08 | Disposition: A | Discharge status: Home / Self Care | Payer: Medicaid Other | Attending: Emergency Medicine | Admitting: Emergency Medicine

## 2021-10-08 ENCOUNTER — Encounter (HOSPITAL_BASED_OUTPATIENT_CLINIC_OR_DEPARTMENT_OTHER): Payer: Self-pay | Admitting: Emergency Medicine

## 2021-10-08 ENCOUNTER — Other Ambulatory Visit: Payer: Self-pay

## 2021-10-08 DIAGNOSIS — R109 Unspecified abdominal pain: Secondary | ICD-10-CM | POA: Insufficient documentation

## 2021-10-08 DIAGNOSIS — Z3A01 Less than 8 weeks gestation of pregnancy: Secondary | ICD-10-CM | POA: Insufficient documentation

## 2021-10-08 DIAGNOSIS — O26891 Other specified pregnancy related conditions, first trimester: Secondary | ICD-10-CM | POA: Diagnosis not present

## 2021-10-08 DIAGNOSIS — R1084 Generalized abdominal pain: Secondary | ICD-10-CM | POA: Diagnosis not present

## 2021-10-08 DIAGNOSIS — R197 Diarrhea, unspecified: Secondary | ICD-10-CM | POA: Insufficient documentation

## 2021-10-08 DIAGNOSIS — Z3491 Encounter for supervision of normal pregnancy, unspecified, first trimester: Secondary | ICD-10-CM

## 2021-10-08 LAB — CBC
HCT: 41.7 % (ref 36.0–46.0)
Hemoglobin: 14.1 g/dL (ref 12.0–15.0)
MCH: 27.8 pg (ref 26.0–34.0)
MCHC: 33.8 g/dL (ref 30.0–36.0)
MCV: 82.1 fL (ref 80.0–100.0)
Platelets: 394 10*3/uL (ref 150–400)
RBC: 5.08 MIL/uL (ref 3.87–5.11)
RDW: 13.2 % (ref 11.5–15.5)
WBC: 10 10*3/uL (ref 4.0–10.5)
nRBC: 0 % (ref 0.0–0.2)

## 2021-10-08 LAB — URINALYSIS, ROUTINE W REFLEX MICROSCOPIC
Bilirubin Urine: NEGATIVE
Glucose, UA: >1000 mg/dL — AB
Hgb urine dipstick: NEGATIVE
Ketones, ur: NEGATIVE mg/dL
Leukocytes,Ua: NEGATIVE
Nitrite: NEGATIVE
Protein, ur: NEGATIVE mg/dL
Specific Gravity, Urine: 1.016 (ref 1.005–1.030)
pH: 6 (ref 5.0–8.0)

## 2021-10-08 LAB — COMPREHENSIVE METABOLIC PANEL
ALT: 56 U/L — ABNORMAL HIGH (ref 0–44)
AST: 32 U/L (ref 15–41)
Albumin: 4.6 g/dL (ref 3.5–5.0)
Alkaline Phosphatase: 54 U/L (ref 38–126)
Anion gap: 11 (ref 5–15)
BUN: 7 mg/dL (ref 6–20)
CO2: 24 mmol/L (ref 22–32)
Calcium: 9.5 mg/dL (ref 8.9–10.3)
Chloride: 95 mmol/L — ABNORMAL LOW (ref 98–111)
Creatinine, Ser: 0.58 mg/dL (ref 0.44–1.00)
GFR, Estimated: >60 mL/min (ref >60)
Glucose, Bld: 250 mg/dL — ABNORMAL HIGH (ref 70–99)
Potassium: 3.9 mmol/L (ref 3.5–5.1)
Sodium: 130 mmol/L — ABNORMAL LOW (ref 135–145)
Total Bilirubin: 0.3 mg/dL (ref 0.3–1.2)
Total Protein: 8.5 g/dL — ABNORMAL HIGH (ref 6.5–8.1)

## 2021-10-08 LAB — PREGNANCY, URINE: Preg Test, Ur: POSITIVE — AB

## 2021-10-08 LAB — LIPASE, BLOOD: Lipase: 47 U/L (ref 11–51)

## 2021-10-08 MED ORDER — LOPERAMIDE HCL 2 MG PO CAPS: 2.0000 mg | ORAL_CAPSULE | Freq: Four times a day (QID) | ORAL | 0 refills | Status: DC | PRN

## 2021-10-08 MED ORDER — ACETAMINOPHEN 325 MG PO TABS: 650.0000 mg | ORAL_TABLET | Freq: Four times a day (QID) | ORAL | 0 refills | Status: DC | PRN

## 2021-10-08 NOTE — Discharge Instructions (Addendum)
Please read and follow all provided instructions.  Your diagnoses today include:  1. Abdominal cramps   2. Diarrhea, unspecified type   3. First trimester pregnancy     Tests performed today include: Blood cell counts and platelets: were normal Kidney and liver function tests: sodium was slightly low, kidney function was normal Pancreas function test (called lipase) Urine test to look for infection A blood or urine test for pregnancy (women only) Vital signs. See below for your results today.   Medications prescribed:  Imodium  Tylenol  Take any prescribed medications only as directed.  Home care instructions:  Follow any educational materials contained in this packet.  Follow-up instructions: Please follow-up with your primary care provider in the next 3 days for further evaluation of your symptoms.    Return instructions:  SEEK IMMEDIATE MEDICAL ATTENTION IF: The pain does not go away or becomes severe  A temperature above 101F develops  Repeated vomiting occurs (multiple episodes)  The pain becomes localized to portions of the abdomen. The right side could possibly be appendicitis. In an adult, the left lower portion of the abdomen could be colitis or diverticulitis.  Blood is being passed in stools or vomit (bright red or black tarry stools)  You develop chest pain, difficulty breathing, dizziness or fainting, or become confused, poorly responsive, or inconsolable (young children) If you have any other emergent concerns regarding your health  Additional Information: Abdominal (belly) pain can be caused by many things. Your caregiver performed an examination and possibly ordered blood/urine tests and imaging (CT scan, x-rays, ultrasound). Many cases can be observed and treated at home after initial evaluation in the emergency department. Even though you are being discharged home, abdominal pain can be unpredictable. Therefore, you need a repeated exam if your pain does not  resolve, returns, or worsens. Most patients with abdominal pain don't have to be admitted to the hospital or have surgery, but serious problems like appendicitis and gallbladder attacks can start out as nonspecific pain. Many abdominal conditions cannot be diagnosed in one visit, so follow-up evaluations are very important.  Your vital signs today were: BP 101/88   Pulse 99   Temp 98.3 F (36.8 C)   Resp 16   LMP 08/29/2021 (Exact Date)   SpO2 100%  If your blood pressure (bp) was elevated above 135/85 this visit, please have this repeated by your doctor within one month. --------------    Safe Medications in Pregnancy   Acne: Benzoyl Peroxide Salicylic Acid  Backache/Headache: Tylenol: 2 regular strength every 4 hours OR              2 Extra strength every 6 hours  Colds/Coughs/Allergies: Benadryl (alcohol free) 25 mg every 6 hours as needed Breath right strips Claritin Cepacol throat lozenges Chloraseptic throat spray Cold-Eeze- up to three times per day Cough drops, alcohol free Flonase (by prescription only) Guaifenesin Mucinex Robitussin DM (plain only, alcohol free) Saline nasal spray/drops Sudafed (pseudoephedrine) & Actifed ** use only after [redacted] weeks gestation and if you do not have high blood pressure Tylenol Vicks Vaporub Zinc lozenges Zyrtec   Constipation: Colace Ducolax suppositories Fleet enema Glycerin suppositories Metamucil Milk of magnesia Miralax Senokot Smooth move tea  Diarrhea: Imodium A-D  *NO pepto Bismol  Hemorrhoids: Anusol Anusol HC Preparation H Tucks  Indigestion: Tums Maalox Mylanta Zantac  Pepcid  Insomnia: Benadryl (alcohol free) 25mg  every 6 hours as needed Tylenol PM Unisom, no Gelcaps  Leg Cramps: Tums MagGel  Nausea/Vomiting:  Bonine  Dramamine Emetrol Ginger extract Sea bands Meclizine  Nausea medication to take during pregnancy:  Unisom (doxylamine succinate 25 mg tablets) Take one tablet  daily at bedtime. If symptoms are not adequately controlled, the dose can be increased to a maximum recommended dose of two tablets daily (1/2 tablet in the morning, 1/2 tablet mid-afternoon and one at bedtime). Vitamin B6 100mg  tablets. Take one tablet twice a day (up to 200 mg per day).  Skin Rashes: Aveeno products Benadryl cream or 25mg  every 6 hours as needed Calamine Lotion 1% cortisone cream  Yeast infection: Gyne-lotrimin 7 Monistat 7  Gum/tooth pain: Anbesol  **If taking multiple medications, please check labels to avoid duplicating the same active ingredients **take medication as directed on the label ** Do not exceed 4000 mg of tylenol in 24 hours **Do not take medications that contain aspirin or ibuprofen

## 2021-10-08 NOTE — ED Provider Notes (Signed)
[facility] EMERGENCY DEPT Provider Note   CSN: [id number] Arrival date & time: 10/08/21  3762     History  Chief Complaint  Patient presents with   Abdominal Pain    & diarrhea    Elizabeth Warren is a 41 y.o. female.  Patient who is approximately [redacted] weeks pregnant presents to the emergency department today for evaluation of abdominal pain, cramping, diarrhea.  Symptoms started 8 days ago.  Patient has had past stools described as "yellow water" containing mucus and worms approximately 10 times a day.  She has not noted blood in the stool.  No vaginal bleeding or cramping.  She denies fever.  She has had nausea but no vomiting.  She denies recent antibiotic use.  She denies recent travel, suspicious food or water exposures.  History of cholecystectomy.  She states that she typically has a couple episodes of diarrhea every week since the surgery.  Is taking Imodium 2-3 times a week.       Home Medications Prior to Admission medications   Medication Sig Start Date End Date Taking? Authorizing Provider  Accu-Chek Softclix Lancets lancets Use as instructed 11/15/20   Simmons-Robinson, Makiera, MD  Blood Glucose Monitoring Suppl (ACCU-CHEK GUIDE ME) w/Device KIT 1 Units by Does not apply route every other day. 09/03/19   Cresenzo, Angelyn Punt, MD  dicyclomine (BENTYL) 20 MG tablet Take 1 tablet (20 mg total) by mouth 4 (four) times daily -  before meals and at bedtime for 14 days. 07/05/21 07/19/21  Concepcion Living, MD  glucose blood (ACCU-CHEK GUIDE) test strip USE AS DIRECTED 08/22/21   Precious Gilding, DO  insulin glargine (LANTUS) 100 UNIT/ML Solostar Pen Inject 20 Units into the skin daily. 08/22/21   Precious Gilding, DO  loperamide (IMODIUM) 2 MG capsule TAKE 1 CAPSULE BY MOUTH UP TO FOUR TIMES DAILY AS NEEDED FOR DIARRHEA OR LOOSE STOOLS 09/10/21   Precious Gilding, DO  naproxen (NAPROSYN) 500 MG tablet Take 1 tablet (500 mg total) by mouth 2 (two) times daily with a meal. 08/13/21   Martyn Malay, MD  Prenatal Vit-Fe Fumarate-FA (WESTAB PLUS) 27-1 MG TABS Take 1 tablet by mouth daily. 08/13/21   Martyn Malay, MD  pyridOXINE (VITAMIN B6) 25 MG tablet Take 1 tablet (25 mg total) by mouth daily. 09/29/21   Vanessa Kick, MD  UNKNOWN TO PATIENT OTC allergy med    [provider]      Allergies    Patient has no known allergies.    Review of Systems   Review of Systems  Physical Exam Updated Vital Signs BP (!) 124/111 (BP Location: Right Arm)   Pulse (!) 106   Temp 98.3 F (36.8 C)   Resp 16   LMP 08/29/2021 (Exact Date)   SpO2 100%  Physical Exam Vitals and nursing note reviewed.  Constitutional:      General: She is not in acute distress.    Appearance: She is well-developed.  HENT:     Head: Normocephalic and atraumatic.     Right Ear: External ear normal.     Left Ear: External ear normal.     Nose: Nose normal.  Eyes:     Conjunctiva/sclera: Conjunctivae normal.  Cardiovascular:     Rate and Rhythm: Normal rate and regular rhythm.     Heart sounds: No murmur heard. Pulmonary:     Effort: No respiratory distress.     Breath sounds: No wheezing, rhonchi or rales.  Abdominal:  Palpations: Abdomen is soft.     Tenderness: There is generalized abdominal tenderness (Mild). There is no guarding or rebound.  Musculoskeletal:     Cervical back: Normal range of motion and neck supple.     Right lower leg: No edema.     Left lower leg: No edema.  Skin:    General: Skin is warm and dry.     Findings: No rash.  Neurological:     General: No focal deficit present.     Mental Status: She is alert. Mental status is at baseline.     Motor: No weakness.  Psychiatric:        Mood and Affect: Mood normal.     ED Results / Procedures / Treatments   Labs (all labs ordered are listed, but only abnormal results are displayed) Labs Reviewed  COMPREHENSIVE METABOLIC PANEL - Abnormal; Notable for the following components:      Result Value   Sodium  130 (*)    Chloride 95 (*)    Glucose, Bld 250 (*)    Total Protein 8.5 (*)    ALT 56 (*)    All other components within normal limits  URINALYSIS, ROUTINE W REFLEX MICROSCOPIC - Abnormal; Notable for the following components:   Glucose, UA >1,000 (*)    All other components within normal limits  PREGNANCY, URINE - Abnormal; Notable for the following components:   Preg Test, Ur POSITIVE (*)    All other components within normal limits  GASTROINTESTINAL PANEL BY PCR, STOOL (REPLACES STOOL CULTURE)  LIPASE, BLOOD  CBC    EKG None  Radiology No results found.  Procedures Procedures    Medications Ordered in ED Medications - No data to display  ED Course/ Medical Decision Making/ A&P    Patient seen and examined. History obtained directly from patient.  No significant risk factors for C. Difficile.  I saw stool sample at bedside -- no worms visible, just mucus which patient likely interpreting as worms.   Labs/EKG: Ordered CBC, CMP, lipase, UA, stool studies  Imaging: None ordered  Medications/Fluids: None ordered.   Most recent vital signs reviewed and are as follows: BP (!) 124/111 (BP Location: Right Arm)   Pulse (!) 106   Temp 98.3 F (36.8 C)   Resp 16   LMP 08/29/2021 (Exact Date)   SpO2 100%   Initial impression: Diarrhea, mucoid stool, first trimester of pregnancy  2:44 PM Reassessment performed. Patient appears comfortable.   Labs personally reviewed and interpreted including: CBC unremarkable; CMP sodium 130, chloride 95, glucose 250; lipase normal; UA unremarkable.  Pregnancy test positive as expected.  Stool study pending.  Reviewed pertinent lab work and imaging with patient at bedside. Questions answered.  Offered IV fluid bolus, patient declines.  States that she has to get home to care for her child.  Most current vital signs reviewed and are as follows: BP 101/88   Pulse 99   Temp 98.3 F (36.8 C)   Resp 16   LMP 08/29/2021 (Exact Date)    SpO2 100%   Plan: Discharge to home.   Prescriptions written for: Tylenol, Imodium  Other home care instructions discussed: [place] diet, PCP follow-up  ED return instructions discussed: The patient was urged to return to the Emergency Department immediately with worsening of current symptoms, worsening abdominal pain, persistent vomiting, blood noted in stools, fever, or any other concerns. The patient verbalized understanding.   Follow-up instructions discussed: Patient encouraged to follow-up with their PCP  in 3 days.                           Medical Decision Making Amount and/or Complexity of Data Reviewed Labs: ordered.  Risk OTC drugs. Prescription drug management.   For this patient's complaint of abdominal pain, the following conditions were considered on the differential diagnosis: gastritis/PUD, enteritis/duodenitis, appendicitis, cholelithiasis/cholecystitis, cholangitis, pancreatitis, ruptured viscus, colitis, diverticulitis, small/large bowel obstruction, proctitis, cystitis, pyelonephritis, ureteral colic, aortic dissection, aortic aneurysm. In women, ectopic pregnancy, pelvic inflammatory disease, ovarian cysts, and tubo-ovarian abscess were also considered. Atypical chest etiologies were also considered including ACS, PE, and pneumonia.   Abdominal pain is intermittent, crampy and generalized.  Low concern for pregnancy related complication at this point.  No reported vaginal bleeding or discharge.  Symptoms are not consistent with ectopic pregnancy.  The patient's vital signs, pertinent lab work and imaging were reviewed and interpreted as discussed in the ED course. Hospitalization was considered for further testing, treatments, or serial exams/observation. However as patient is well-appearing, has a stable exam, and reassuring studies today, I do not feel that they warrant admission at this time. This plan was discussed with the patient who verbalizes agreement and comfort  with this plan and seems reliable and able to return to the Emergency Department with worsening or changing symptoms.          Final Clinical Impression(s) / ED Diagnoses Final diagnoses:  Abdominal cramps  Diarrhea, unspecified type  First trimester pregnancy    Rx / DC Orders ED Discharge Orders          Ordered    loperamide (IMODIUM) 2 MG capsule  4 times daily PRN        10/08/21 1402    acetaminophen (TYLENOL) 325 MG tablet  Every 6 hours PRN        10/08/21 1402              Carlisle Cater, PA-C 10/08/21 1447    Gareth Morgan, MD 10/10/21 [phone]

## 2021-10-08 NOTE — ED Triage Notes (Signed)
Pt reports watery diarrhea with worms in it for 7 days, along with upper abdominal cramping.  Pt 5 weeks preg.

## 2021-10-09 ENCOUNTER — Telehealth (HOSPITAL_BASED_OUTPATIENT_CLINIC_OR_DEPARTMENT_OTHER): Payer: Self-pay

## 2021-10-09 ENCOUNTER — Telehealth (HOSPITAL_BASED_OUTPATIENT_CLINIC_OR_DEPARTMENT_OTHER): Payer: Self-pay | Admitting: Emergency Medicine

## 2021-10-09 LAB — GASTROINTESTINAL PANEL BY PCR, STOOL (REPLACES STOOL CULTURE)
Adenovirus F40/41: NOT DETECTED
Astrovirus: NOT DETECTED
Campylobacter species: NOT DETECTED
Cryptosporidium: NOT DETECTED
Cyclospora cayetanensis: NOT DETECTED
E. coli O157: NOT DETECTED
Entamoeba histolytica: NOT DETECTED
Enteroaggregative E coli (EAEC): NOT DETECTED
Enterotoxigenic E coli (ETEC): DETECTED — AB
Giardia lamblia: NOT DETECTED
Norovirus GI/GII: NOT DETECTED
Plesimonas shigelloides: NOT DETECTED
Rotavirus A: NOT DETECTED
Salmonella species: NOT DETECTED
Sapovirus (I, II, IV, and V): NOT DETECTED
Shiga like toxin producing E coli (STEC): DETECTED — AB
Shigella/Enteroinvasive E coli (EIEC): NOT DETECTED
Vibrio cholerae: NOT DETECTED
Vibrio species: NOT DETECTED
Yersinia enterocolitica: NOT DETECTED

## 2021-10-10 DIAGNOSIS — Z331 Pregnant state, incidental: Secondary | ICD-10-CM | POA: Diagnosis not present

## 2021-10-10 DIAGNOSIS — R197 Diarrhea, unspecified: Secondary | ICD-10-CM | POA: Diagnosis not present

## 2021-10-11 ENCOUNTER — Ambulatory Visit: Payer: Medicaid Other

## 2021-10-11 NOTE — Progress Notes (Deleted)
    SUBJECTIVE:   CHIEF COMPLAINT / HPI:   Seen in ED 10/08/21 for abdominal pain and diarrhea. Stool study positive for E. Coli-- recommended supportive care Given Imodium  She has a prior h/o IBS (on tx while in [place]), question of prior C Diff infection, and s/p cholecytsectomy. Has been following with ID for her chronic diarrhea-- they felt no infectious etiology, needs GI evaluation  Currently pregnant-- has not started prenatal care yet  PERTINENT  PMH / PSH: T2DM  OBJECTIVE:   LMP 08/29/2021 (Exact Date)   ***  ASSESSMENT/PLAN:   No problem-specific Assessment & Plan notes found for this encounter.     Maury Dus, MD [facility]

## 2021-10-29 ENCOUNTER — Ambulatory Visit (INDEPENDENT_AMBULATORY_CARE_PROVIDER_SITE_OTHER): Payer: Medicaid Other

## 2021-10-29 VITALS — BP 136/89 | HR 102 | Ht 60.0 in | Wt 193.3 lb

## 2021-10-29 DIAGNOSIS — O0991 Supervision of high risk pregnancy, unspecified, first trimester: Secondary | ICD-10-CM

## 2021-10-29 DIAGNOSIS — O0990 Supervision of high risk pregnancy, unspecified, unspecified trimester: Secondary | ICD-10-CM

## 2021-10-29 DIAGNOSIS — E1165 Type 2 diabetes mellitus with hyperglycemia: Secondary | ICD-10-CM

## 2021-10-29 DIAGNOSIS — O3680X0 Pregnancy with inconclusive fetal viability, not applicable or unspecified: Secondary | ICD-10-CM

## 2021-10-29 DIAGNOSIS — Z3A08 8 weeks gestation of pregnancy: Secondary | ICD-10-CM

## 2021-10-29 MED ORDER — DICLEGIS 10-10 MG PO TBEC: 2.0000 | DELAYED_RELEASE_TABLET | Freq: Every day | ORAL | 5 refills | Status: DC

## 2021-10-29 MED ORDER — LEVEMIR 100 UNIT/ML ~~LOC~~ SOLN: 30.0000 [IU] | Freq: Every day | SUBCUTANEOUS | 1 refills | Status: DC

## 2021-10-29 MED ORDER — GOJJI WEIGHT SCALE MISC: 1.0000 | 0 refills | Status: DC

## 2021-10-29 MED ORDER — BLOOD PRESSURE KIT DEVI: 1.0000 | 0 refills | Status: AC

## 2021-10-29 MED ORDER — "INSULIN SYRINGE/NEEDLE 27G X 1/2"" 0.5 ML MISC": 1.0000 | Freq: Every day | 2 refills | Status: DC

## 2021-10-29 NOTE — Progress Notes (Signed)
Patient states that she was switched from lantus to levemir by urgent care. Patient states that she is taking 30 units of insulin daily, but states that her blood sugars in the morning are about 157.  Discussed with Dr. Elly Modena  Patient referred to diabetes nutrition and management. Refill sent for Levemir and syringes.

## 2021-10-29 NOTE — Progress Notes (Signed)
New OB Intake  I connected with  Jihan B Clavijo on 10/29/21 at 10:15 AM EDT by in person and verified that I am speaking with the correct person using two identifiers. Nurse is located at [facility] and pt is located at [place].  I discussed the limitations, risks, security and privacy concerns of performing an evaluation and management service by telephone and the availability of in person appointments. I also discussed with the patient that there may be a patient responsible charge related to this service. The patient expressed understanding and agreed to proceed.  I explained I am completing New OB Intake today. We discussed her EDD of 06/05/22 that is based on LMP of 08/29/21. Pt is G6/P5005. I reviewed her allergies, medications, Medical/Surgical/OB history, and appropriate screenings. I informed her of [facility] services. [facility] information placed in AVS. Based on history, this is a/an  pregnancy complicated by hypertension and diabetes  .   Patient Active Problem List   Diagnosis Date Noted   Irritable bowel syndrome with diarrhea 07/11/2021   Clostridioides difficile infection 07/11/2021   S/P cholecystectomy 01/17/2021   Chronic diarrhea 02/25/2020   Low TSH level 09/03/2019   Type 2 diabetes mellitus with hyperglycemia ([place]) 09/01/2019   Obesity (BMI 30-39.9) 10/31/2011    Concerns addressed today  Delivery Plans Plans to deliver at [facility]. Patient given information for [facility] Healthy Baby website for more information about Women's and [place]. Patient is not interested in water birth. Offered upcoming OB visit with CNM to discuss further.  MyChart/Babyscripts MyChart access verified. I explained pt will have some visits in office and some virtually. Babyscripts instructions given and order placed. Patient verifies receipt of registration text/e-mail. Account successfully created and app downloaded.  Blood Pressure Cuff/Weight Scale Blood pressure cuff ordered for patient to  pick-up from [employer]. Explained after first prenatal appt pt will check weekly and document in 58. Patient does not  have weight scale. Weight scale ordered for patient to pick up from [employer].   Anatomy US Explained first scheduled [place] will be around 19 weeks. Dating and viability scan performed today. Anatomy US to be scheduled at [facility] provider visit.  Labs Discussed Johnsie Cancel genetic screening with patient. Would like both Panorama and Horizon drawn at new OB visit. Routine prenatal labs needed.  Covid Vaccine Patient has not covid vaccine.   Is patient a CenteringPregnancy candidate?  Declined Declined due to Group Setting Not a candidate due to DM Centering Patient" indicated on sticky note    Social Determinants of Health Food Insecurity: Patient denies food insecurity. WIC Referral: Patient is interested in referral to [facility].  Transportation: Patient denies transportation needs. Childcare: Discussed no children allowed at ultrasound appointments. Offered childcare services; patient declines childcare services at this time.  First visit review I reviewed new OB appt with pt. I explained she will have a provider visit that includes prenatal labs, pap smear, std screening, genetic screening, and discuss plan of care for pregnancy. Explained pt will be seen by Peggy Constant at first visit; encounter routed to appropriate provider. Explained that patient will be seen by pregnancy navigator following visit with provider.   Lucianne Lei, RN 10/29/2021  10:15 AM

## 2021-11-06 ENCOUNTER — Other Ambulatory Visit: Payer: Self-pay

## 2021-11-06 ENCOUNTER — Ambulatory Visit (INDEPENDENT_AMBULATORY_CARE_PROVIDER_SITE_OTHER): Payer: Medicaid Other | Admitting: Registered"

## 2021-11-06 ENCOUNTER — Encounter: Payer: Medicaid Other | Attending: Neurology | Admitting: Registered"

## 2021-11-06 DIAGNOSIS — Z3A00 Weeks of gestation of pregnancy not specified: Secondary | ICD-10-CM | POA: Diagnosis not present

## 2021-11-06 DIAGNOSIS — O24119 Pre-existing diabetes mellitus, type 2, in pregnancy, unspecified trimester: Secondary | ICD-10-CM

## 2021-11-06 DIAGNOSIS — Z713 Dietary counseling and surveillance: Secondary | ICD-10-CM | POA: Diagnosis not present

## 2021-11-06 MED ORDER — ACCU-CHEK SOFTCLIX LANCET DEV KIT: 1.0000 | PACK | Freq: Every day | 0 refills | Status: DC

## 2021-11-06 NOTE — Progress Notes (Signed)
Patient was seen for T2D in pregnancy self-management on 11/06/2021  Start time 0915 and End time 1000   Estimated due date: 06/05/2022; [redacted]w[redacted]d  Clinical: Medications: Levemir 30 units after dinner Medical History: GDM 2018, T2D Labs: OGTT n/a, A1c 8.2% 09/01/21  Dietary and Lifestyle History: Pt sates FBS was 150-155, but after 1 week taking 30 units Levemir after dinner, now FBS 102-125. Pt states she cannot tolerate metformin.   Pt states she was using Lantus prior to pregnancy and feels it was not working well for her, states she had fatigue and numbness, but since switching to Levemir she states she has energy to exercise and numbness has resolved.   Pt noticed when drinking milk or eating certain foods at night has elevated FBS so she switched to drinking milk earlier in the day  Pt states she was eating a lot of bread but cut back when found out she was pregnant and blood sugar was high. Pt states she enjoys corn and peas, doesn't eat a lot of rice. Pt states she has cut back her date intake. Pt states she likes grapes and apples but does not eat them often.  Physical Activity: walking in the evening Stress: not assessed Sleep: not assessed  24 hr Recall: not complete assessment First Meal: medium orange juice, chicken biscuit (1 hr 182 mg/dL) Snack: Second meal: Snack: Third meal: Snack: Beverages: whole milk, juice, water, (may also be drinking sweet tea)  NUTRITION INTERVENTION  Nutrition education (E-1) on the following topics:   Initial Follow-up  []  []  Definition of Gestational Diabetes []  []  Why dietary management is important in controlling blood glucose [x]  []  Effects each nutrient has on blood glucose levels []  []  Simple carbohydrates vs complex carbohydrates []  []  Fluid intake [x]  []  Creating a balanced meal plan []  []  Carbohydrate counting  []  []  When to check blood glucose levels [x]  []  Proper blood glucose monitoring techniques []  []  Effect of stress and  stress reduction techniques  []  []  Exercise effect on blood glucose levels, appropriate exercise during pregnancy []  []  Importance of limiting caffeine and abstaining from alcohol and smoking [x]  []  Medications used for blood sugar control during pregnancy [x]  []  Hypoglycemia and rule of 15 [x]  []  Postpartum self care  Patient has a meter prior to visit.  FBS: 102-120 Postprandial: 1-hr post prandial checked during visit 182 mg/dL  Patient instructed to monitor glucose levels: FBS: 70 - ? 95 mg/dL 1 hour: ? 140 mg/dL 2 hour: ? 120 mg/dL  Patient received handouts: Nutrition Diabetes and Pregnancy Carbohydrate Counting List  Patient will be seen for follow-up in 1 week or as needed.

## 2021-11-13 ENCOUNTER — Other Ambulatory Visit: Payer: Self-pay

## 2021-11-13 DIAGNOSIS — O24119 Pre-existing diabetes mellitus, type 2, in pregnancy, unspecified trimester: Secondary | ICD-10-CM

## 2021-11-15 ENCOUNTER — Encounter: Payer: Medicaid Other | Attending: Obstetrics and Gynecology | Admitting: Registered"

## 2021-11-15 ENCOUNTER — Other Ambulatory Visit: Payer: Self-pay

## 2021-11-15 ENCOUNTER — Ambulatory Visit (INDEPENDENT_AMBULATORY_CARE_PROVIDER_SITE_OTHER): Payer: Medicaid Other | Admitting: Registered"

## 2021-11-15 DIAGNOSIS — O24119 Pre-existing diabetes mellitus, type 2, in pregnancy, unspecified trimester: Secondary | ICD-10-CM

## 2021-11-15 NOTE — Progress Notes (Signed)
Patient was seen for T2D in pregnancy self-management on 11/15/2021  Start time 0919 and End time 0930   Estimated due date: 06/05/2022; [redacted]w[redacted]d  Clinical: Medications: Levemir 30 units after dinner (pt still taking) Medical History: GDM 2018, T2D Labs: OGTT n/a, A1c 8.2% 09/01/21  Dietary and Lifestyle History: From last visit patient stated she feels good with taking Levemir and did not want to change her medication. Pt states she made changes to diet, including stopped drinking juice, eating a lot less bread, earlier dinner, and not drinking whole milk at night. Pt states she has lost weight but is not limiting the amount of food she is eating and feeling satisfied with her meals.   Pt changes are helping pt have normal blood sugar. No follow-up appt made  Physical Activity: walking after dinner Stress: not assessed Sleep: not assessed  24 hr Recall:  First Meal: chicken biscuit, mashed potatoes, small milk Snack: a little bit grapes Second meal: skipped due to nausea Snack: Third meal: chicken sub, vegetables Snack: Beverages: whole milk with breakfast, water  NUTRITION INTERVENTION  Nutrition education (E-1) on the following topics:   Initial Follow-up  []  []  Definition of Gestational Diabetes []  []  Why dietary management is important in controlling blood glucose [x]  []  Effects each nutrient has on blood glucose levels []  []  Simple carbohydrates vs complex carbohydrates []  [x]  Fluid intake [x]  [x]  Creating a balanced meal plan (timing of dinner) []  []  Carbohydrate counting  []  []  When to check blood glucose levels [x]  []  Proper blood glucose monitoring techniques []  []  Effect of stress and stress reduction techniques  [x]  [x]  Exercise effect on blood glucose levels, appropriate exercise during pregnancy []  []  Importance of limiting caffeine and abstaining from alcohol and smoking [x]  []  Medications used for blood sugar control during pregnancy [x]  []  Hypoglycemia and  rule of 15 [x]  []  Postpartum self care   Patient instructed to monitor glucose levels: FBS: 70 - ? 95 mg/dL 1 hour: ? 140 mg/dL 2 hour: ? 120 mg/dL  Patient received handouts: none  Patient will be seen for follow-up as needed.

## 2021-11-27 ENCOUNTER — Encounter (HOSPITAL_COMMUNITY): Payer: Self-pay | Admitting: Obstetrics and Gynecology

## 2021-11-27 ENCOUNTER — Inpatient Hospital Stay (HOSPITAL_COMMUNITY)
Admission: AD | Admit: 2021-11-27 | Discharge: 2021-11-27 | Disposition: A | Discharge status: Home / Self Care | Payer: Medicaid Other | Attending: Obstetrics and Gynecology | Admitting: Obstetrics and Gynecology

## 2021-11-27 DIAGNOSIS — O26891 Other specified pregnancy related conditions, first trimester: Secondary | ICD-10-CM | POA: Insufficient documentation

## 2021-11-27 DIAGNOSIS — O10911 Unspecified pre-existing hypertension complicating pregnancy, first trimester: Secondary | ICD-10-CM | POA: Diagnosis not present

## 2021-11-27 DIAGNOSIS — Z3A12 12 weeks gestation of pregnancy: Secondary | ICD-10-CM | POA: Diagnosis not present

## 2021-11-27 DIAGNOSIS — K529 Noninfective gastroenteritis and colitis, unspecified: Secondary | ICD-10-CM

## 2021-11-27 LAB — URINALYSIS, ROUTINE W REFLEX MICROSCOPIC
Bilirubin Urine: NEGATIVE
Glucose, UA: 50 mg/dL — AB
Hgb urine dipstick: NEGATIVE
Ketones, ur: NEGATIVE mg/dL
Leukocytes,Ua: NEGATIVE
Nitrite: NEGATIVE
Protein, ur: NEGATIVE mg/dL
Specific Gravity, Urine: 1.006 (ref 1.005–1.030)
pH: 5 (ref 5.0–8.0)

## 2021-11-27 MED ORDER — LOPERAMIDE HCL 2 MG PO CAPS: 2.0000 mg | ORAL_CAPSULE | ORAL | 0 refills | Status: DC | PRN

## 2021-11-27 NOTE — MAU Note (Signed)
...  Elizabeth Warren is a 41 y.o. at [redacted]w[redacted]d here in MAU reporting: Diarrhea several times over the past two days. She reports she had her gall bladder removed a year and a half ago and states since then she has battled diarrhea. She reports she has had multiple testings done and reports she was instructed everything appears normal and that she just needs to take imodium every day. She reports since becoming pregnant she has only been taking the medication a couple times each week. She reports she has been nervous to take it every day due to her being pregnant even though she has been told by multiple doctors that the medication is safe in pregnancy. Denies current pain but reports she has lower abdominal cramps with each episode of diarrhea. Denies VB or LOF.   She reports she used to  takes imodium everyday.   Onset of complaint: Ongoing. Increased over the past two days. Pain score: Denies pain.  FHT: 158 doppler Lab orders placed from triage: UA

## 2021-11-27 NOTE — Progress Notes (Signed)
Elizabeth Warren CNM discussed d/c plan with pt.Pt left before receiving d/c papers.

## 2021-11-27 NOTE — MAU Provider Note (Signed)
History     CSN: [id number]  Arrival date and time: 11/27/21 1825   Event Date/Time   First Provider Initiated Contact with Patient 11/27/21 1907      Chief Complaint  Patient presents with   Diarrhea   41 y.o. [id number] @12 .6 wks presenting with diarrhea. This has been ongoing for years and became worse since her cholecystectomy almost 2 years ago. She reports daily non-bloody, watery stools. She usually take Imodium daily but has been hesitant since she's been pregnancy d/t safety. Denies fevers, sick contacts, recent travel, or recent antibiotic use. No pregnancy concerns.    OB History     Gravida  6   Para  5   Term  5   Preterm  0   AB  0   Living  5      SAB  0   IAB  0   Ectopic  0   Multiple  0   Live Births  5           Past Medical History:  Diagnosis Date   Acute cholecystitis 01/18/2020   Gestational diabetes    History of gestational diabetes 09/06/2016   Hypertension    pregnancy   NVD (normal vaginal delivery) 09/20/2010   Pre-existing type 2 diabetes mellitus during pregnancy in second trimester 10/02/2016   S/P cholecystectomy 01/17/2021    Past Surgical History:  Procedure Laterality Date   CHOLECYSTECTOMY N/A 01/19/2020   Procedure: LAPAROSCOPIC CHOLECYSTECTOMY;  Surgeon: Stark Klein, MD;  Location: [facility];  Service: General;  Laterality: N/A;    Family History  Problem Relation Age of Onset   Diabetes Mother    Hypertension Mother    Diabetes Father    Hypertension Father    Diabetes Maternal Grandmother    Hypertension Maternal Grandmother    Diabetes Maternal Grandfather    Hypertension Maternal Grandfather    Diabetes Paternal Grandmother    Hypertension Paternal Grandmother    Diabetes Paternal Grandfather    Hypertension Paternal Grandfather    Cancer Neg Hx    Heart disease Neg Hx    Stroke Neg Hx     Social History   Tobacco Use   Smoking status: Never   Smokeless tobacco: Never  Vaping Use   Vaping  Use: Never used  Substance Use Topics   Alcohol use: No   Drug use: No    Allergies: No Known Allergies  Medications Prior to Admission  Medication Sig Dispense Refill Last Dose   glucose blood (ACCU-CHEK GUIDE) test strip USE AS DIRECTED 100 strip 3 11/26/2021   INS SYRINGE/NEEDLE .5CC/27G 27G X 1/2" 0.5 ML MISC 1 Syringe by Does not apply route daily. 100 each 2 11/26/2021   insulin glargine (LANTUS) 100 UNIT/ML Solostar Pen Inject 20 Units into the skin daily. 3 mL 4 11/26/2021   Lancets Misc. (ACCU-CHEK SOFTCLIX LANCET DEV) KIT 1 kit by Does not apply route daily. 1 kit 0 11/26/2021   [DISCONTINUED] loperamide (IMODIUM) 2 MG capsule Take 1 capsule (2 mg total) by mouth 4 (four) times daily as needed for diarrhea or loose stools. 12 capsule 0 11/26/2021   Accu-Chek Softclix Lancets lancets Use as instructed 100 each 12    acetaminophen (TYLENOL) 325 MG tablet Take 2 tablets (650 mg total) by mouth every 6 (six) hours as needed. 30 tablet 0    Blood Glucose Monitoring Suppl (ACCU-CHEK GUIDE ME) w/Device KIT 1 Units by Does not apply route every other day. 1 kit 0  Blood Pressure Monitoring (BLOOD PRESSURE KIT) DEVI 1 kit by Does not apply route once a week. 1 each 0    DICLEGIS 10-10 MG TBEC Take 2 tablets by mouth at bedtime. If symptoms persist, add one tablet in the morning and one in the afternoon 100 tablet 5    dicyclomine (BENTYL) 20 MG tablet Take 1 tablet (20 mg total) by mouth 4 (four) times daily -  before meals and at bedtime for 14 days. 56 tablet 1    LEVEMIR 100 UNIT/ML injection Inject 0.3 mLs (30 Units total) into the skin daily. 10 mL 1    Misc. Devices (GOJJI WEIGHT SCALE) MISC 1 Device by Does not apply route every 30 (thirty) days. 1 each 0    Prenatal Vit-Fe Fumarate-FA (WESTAB PLUS) 27-1 MG TABS Take 1 tablet by mouth daily. 90 tablet 2    pyridOXINE (VITAMIN B6) 25 MG tablet Take 1 tablet (25 mg total) by mouth daily. 30 tablet 2    UNKNOWN TO PATIENT OTC allergy  med       Review of Systems  Constitutional:  Negative for fever.  Gastrointestinal:  Positive for diarrhea. Negative for abdominal pain.   Physical Exam   Blood pressure 129/78, pulse (!) 107, temperature 99.3 F (37.4 C), temperature source Oral, resp. rate 14, height 5' (1.524 m), weight 88.5 kg, last menstrual period 08/29/2021, SpO2 99 %.  Physical Exam Vitals and nursing note reviewed.  Constitutional:      General: She is not in acute distress.    Appearance: Normal appearance.  HENT:     Head: Normocephalic and atraumatic.  Pulmonary:     Effort: Pulmonary effort is normal. No respiratory distress.  Abdominal:     General: There is no distension.     Palpations: Abdomen is soft. There is no mass.     Tenderness: There is no abdominal tenderness. There is no guarding or rebound.     Hernia: No hernia is present.  Musculoskeletal:        General: Normal range of motion.     Cervical back: Normal range of motion.  Skin:    General: Skin is warm and dry.  Neurological:     General: No focal deficit present.     Mental Status: She is alert and oriented to person, place, and time.  Psychiatric:        Mood and Affect: Mood normal.        Behavior: Behavior normal.   FHT 158  MAU Course  Procedures  MDM Chart reviewed. Pt followed by ID and GI extensively within the last year. Hx of e.coli and c.diff last year, treated and cured. Pt reports normal endoscopy earlier this year although it's not on file for review. No emergent condition identified at this time, pt reassured Imodium is considered safe to use in pregnancy. I recommend she f/u with GI although she tells me she doesn't need to. She is stable for discharge home.   Assessment and Plan   1. [redacted] weeks gestation of pregnancy   2. Chronic diarrhea    Discharge home Follow up at [facility] as scheduled Follow up with GI provider Rx Imodium (per pt request)  Allergies as of 11/27/2021   No Known  Allergies      Medication List     TAKE these medications    Accu-Chek Guide Me w/Device Kit 1 Units by Does not apply route every other day.   Accu-Chek Guide test  strip Generic drug: glucose blood USE AS DIRECTED   Accu-Chek Softclix Lancet Dev Kit 1 kit by Does not apply route daily.   Accu-Chek Softclix Lancets lancets Use as instructed   acetaminophen 325 MG tablet Commonly known as: Tylenol Take 2 tablets (650 mg total) by mouth every 6 (six) hours as needed.   Blood Pressure Kit Devi 1 kit by Does not apply route once a week.   Diclegis 10-10 MG Tbec Generic drug: Doxylamine-Pyridoxine Take 2 tablets by mouth at bedtime. If symptoms persist, add one tablet in the morning and one in the afternoon   dicyclomine 20 MG tablet Commonly known as: BENTYL Take 1 tablet (20 mg total) by mouth 4 (four) times daily -  before meals and at bedtime for 14 days.   Gojji Weight Scale Misc 1 Device by Does not apply route every 30 (thirty) days.   INS SYRINGE/NEEDLE .5CC/27G 27G X 1/2" 0.5 ML Misc 1 Syringe by Does not apply route daily.   insulin glargine 100 UNIT/ML Solostar Pen Commonly known as: LANTUS Inject 20 Units into the skin daily.   Levemir 100 UNIT/ML injection Generic drug: insulin detemir Inject 0.3 mLs (30 Units total) into the skin daily.   loperamide 2 MG capsule Commonly known as: IMODIUM Take 1 capsule (2 mg total) by mouth as needed for diarrhea or loose stools. What changed: when to take this   pyridOXINE 25 MG tablet Commonly known as: VITAMIN B6 Take 1 tablet (25 mg total) by mouth daily.   UNKNOWN TO PATIENT OTC allergy med   WesTab Plus 27-1 MG Tabs Take 1 tablet by mouth daily.       Julianne Handler, CNM 11/27/2021, 7:33 PM

## 2021-11-29 DIAGNOSIS — O0990 Supervision of high risk pregnancy, unspecified, unspecified trimester: Secondary | ICD-10-CM | POA: Diagnosis not present

## 2021-12-07 ENCOUNTER — Other Ambulatory Visit (HOSPITAL_COMMUNITY)
Admission: RE | Admit: 2021-12-07 | Discharge: 2021-12-07 | Disposition: A | Discharge status: Home / Self Care | Payer: Medicaid Other | Source: Ambulatory Visit | Attending: Obstetrics and Gynecology | Admitting: Obstetrics and Gynecology

## 2021-12-07 ENCOUNTER — Ambulatory Visit (INDEPENDENT_AMBULATORY_CARE_PROVIDER_SITE_OTHER): Payer: Medicaid Other | Admitting: Obstetrics and Gynecology

## 2021-12-07 ENCOUNTER — Encounter: Payer: Self-pay | Admitting: Obstetrics and Gynecology

## 2021-12-07 VITALS — BP 129/87 | HR 98 | Wt 194.5 lb

## 2021-12-07 DIAGNOSIS — O24312 Unspecified pre-existing diabetes mellitus in pregnancy, second trimester: Secondary | ICD-10-CM

## 2021-12-07 DIAGNOSIS — O0992 Supervision of high risk pregnancy, unspecified, second trimester: Secondary | ICD-10-CM | POA: Diagnosis not present

## 2021-12-07 DIAGNOSIS — E1165 Type 2 diabetes mellitus with hyperglycemia: Secondary | ICD-10-CM

## 2021-12-07 DIAGNOSIS — Z3A14 14 weeks gestation of pregnancy: Secondary | ICD-10-CM

## 2021-12-07 DIAGNOSIS — O09529 Supervision of elderly multigravida, unspecified trimester: Secondary | ICD-10-CM | POA: Insufficient documentation

## 2021-12-07 DIAGNOSIS — O0990 Supervision of high risk pregnancy, unspecified, unspecified trimester: Secondary | ICD-10-CM

## 2021-12-07 DIAGNOSIS — O99212 Obesity complicating pregnancy, second trimester: Secondary | ICD-10-CM

## 2021-12-07 DIAGNOSIS — Z8759 Personal history of other complications of pregnancy, childbirth and the puerperium: Secondary | ICD-10-CM

## 2021-12-07 DIAGNOSIS — O09522 Supervision of elderly multigravida, second trimester: Secondary | ICD-10-CM

## 2021-12-07 DIAGNOSIS — O24319 Unspecified pre-existing diabetes mellitus in pregnancy, unspecified trimester: Secondary | ICD-10-CM | POA: Insufficient documentation

## 2021-12-07 DIAGNOSIS — O99210 Obesity complicating pregnancy, unspecified trimester: Secondary | ICD-10-CM | POA: Insufficient documentation

## 2021-12-07 MED ORDER — ASPIRIN 81 MG PO TBEC: 81.0000 mg | DELAYED_RELEASE_TABLET | Freq: Every day | ORAL | 2 refills | Status: DC

## 2021-12-07 MED ORDER — BLOOD GLUCOSE MONITOR KIT: PACK | 0 refills | Status: AC

## 2021-12-07 NOTE — Patient Instructions (Signed)

## 2021-12-07 NOTE — Progress Notes (Signed)
Pt presents for NOB visit. Pt presents for Pt c/o severe diarrhea and has been taking imodium and has concerns taking it during pregnancy.

## 2021-12-07 NOTE — Progress Notes (Signed)
Subjective:    Elizabeth Warren is a [id number] [redacted]w[redacted]d being seen today for her first obstetrical visit.  Her obstetrical history is significant for advanced maternal age, obesity, pregnancy induced hypertension, and pre-existing diabetes . Patient was seen by diabetes educator last month and reports CBGs being well controlled. She did not check CBG levels this past week as her meter broke. Patient does intend to breast feed. Pregnancy history fully reviewed.  Patient reports no complaints.  Vitals:   12/07/21 0841  BP: 129/87  Pulse: 98  Weight: 194 lb 8 oz (88.2 kg)    HISTORY: OB History  Gravida Para Term Preterm AB Living  6 5 5  0 0 5  SAB IAB Ectopic Multiple Live Births  0 0 0 0 5    # Outcome Date GA Lbr Len/2nd Weight Sex Delivery Anes PTL Lv  6 Current           5 Term 03/07/17 [redacted]w[redacted]d 10:14 / 02:02 7 lb 8.6 oz (3.42 kg) M Vag-Spont EPI  LIV     Birth Comments: WDL  4 Term 09/20/10 [redacted]w[redacted]d 15:45 / 00:19 8 lb 7.8 oz (3.85 kg) F Vag-Spont EPI  LIV  3 Term 07/03/07    F Vag-Spont   LIV  2 Term 03/22/05    F Vag-Spont   LIV  1 Term 10/25/02    Elizabeth Warren   LIV   Past Medical History:  Diagnosis Date   Acute cholecystitis 01/18/2020   Gestational diabetes    History of gestational diabetes 09/06/2016   Hypertension    pregnancy   NVD (normal vaginal delivery) 09/20/2010   Pre-existing type 2 diabetes mellitus during pregnancy in second trimester 10/02/2016   S/P cholecystectomy 01/17/2021   Past Surgical History:  Procedure Laterality Date   CHOLECYSTECTOMY N/A 01/19/2020   Procedure: LAPAROSCOPIC CHOLECYSTECTOMY;  Surgeon: Stark Klein, MD;  Location: [facility];  Service: General;  Laterality: N/A;   Family History  Problem Relation Age of Onset   Diabetes Mother    Hypertension Mother    Diabetes Father    Hypertension Father    Diabetes Maternal Grandmother    Hypertension Maternal Grandmother    Diabetes Maternal Grandfather    Hypertension Maternal Grandfather     Diabetes Paternal Grandmother    Hypertension Paternal Grandmother    Diabetes Paternal Grandfather    Hypertension Paternal Grandfather    Cancer Neg Hx    Heart disease Neg Hx    Stroke Neg Hx      Exam    Uterus:   14-weeks  Pelvic Exam:    Perineum: No Hemorrhoids, Normal Perineum   Vulva: normal   Vagina:  normal mucosa, normal discharge   pH:    Cervix: multiparous appearance and closed and long   Adnexa: no mass, fullness, tenderness   Bony Pelvis: gynecoid  System: Breast:  normal appearance, no masses or tenderness   Skin: normal coloration and turgor, no rashes    Neurologic: oriented, no focal deficits   Extremities: normal strength, tone, and muscle mass   HEENT extra ocular movement intact   Mouth/Teeth mucous membranes moist, pharynx normal without lesions and dental hygiene good   Neck supple and no masses   Cardiovascular: regular rate and rhythm   Respiratory:  appears well, vitals normal, no respiratory distress, acyanotic, normal RR, chest clear, no wheezing, crepitations, rhonchi, normal symmetric air entry   Abdomen: soft, non-tender; bowel sounds normal; no masses,  no organomegaly   Urinary:  Assessment:    Pregnancy: [id number] Patient Active Problem List   Diagnosis Date Noted   Maternal obesity affecting pregnancy, antepartum 12/07/2021   Type 2 diabetes mellitus complicating pregnancy, antepartum 11/06/2021   Supervision of high risk pregnancy, antepartum 10/29/2021   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 Prenatal vitamins. Problem list reviewed and updated. Genetic Screening discussed : ordered.  Ultrasound discussed; fetal survey:  ordered. Baseline labs ordered Rx ASA provided Fetal echo ordered Patient referred for eye exam Patient instructed to bring CBG log at every visit  Follow up in 4 weeks. 50% of 30 min visit spent on counseling and coordination of care.     Elizabeth Warren 12/07/2021

## 2021-12-10 LAB — CERVICOVAGINAL ANCILLARY ONLY
Chlamydia: NEGATIVE
Comment: NEGATIVE
Comment: NEGATIVE
Comment: NORMAL
Neisseria Gonorrhea: NEGATIVE
Trichomonas: NEGATIVE

## 2021-12-10 LAB — CYTOLOGY - PAP: Diagnosis: NEGATIVE

## 2021-12-11 ENCOUNTER — Other Ambulatory Visit: Payer: Medicaid Other

## 2021-12-11 DIAGNOSIS — O09522 Supervision of elderly multigravida, second trimester: Secondary | ICD-10-CM | POA: Diagnosis not present

## 2021-12-11 DIAGNOSIS — E1165 Type 2 diabetes mellitus with hyperglycemia: Secondary | ICD-10-CM | POA: Diagnosis not present

## 2021-12-11 DIAGNOSIS — O0990 Supervision of high risk pregnancy, unspecified, unspecified trimester: Secondary | ICD-10-CM | POA: Diagnosis not present

## 2021-12-12 ENCOUNTER — Other Ambulatory Visit: Payer: Self-pay | Admitting: Obstetrics and Gynecology

## 2021-12-12 DIAGNOSIS — E1165 Type 2 diabetes mellitus with hyperglycemia: Secondary | ICD-10-CM

## 2021-12-12 LAB — COMPREHENSIVE METABOLIC PANEL
ALT: 16 IU/L (ref 0–32)
AST: 16 IU/L (ref 0–40)
Albumin/Globulin Ratio: 1.3 (ref 1.2–2.2)
Albumin: 3.9 g/dL (ref 3.9–4.9)
Alkaline Phosphatase: 56 IU/L (ref 44–121)
BUN/Creatinine Ratio: 11 (ref 9–23)
BUN: 5 mg/dL — ABNORMAL LOW (ref 6–20)
Bilirubin Total: <0.2 mg/dL (ref 0.0–1.2)
CO2: 18 mmol/L — ABNORMAL LOW (ref 20–29)
Calcium: 9.5 mg/dL (ref 8.7–10.2)
Chloride: 100 mmol/L (ref 96–106)
Creatinine, Ser: 0.46 mg/dL — ABNORMAL LOW (ref 0.57–1.00)
Globulin, Total: 3 g/dL (ref 1.5–4.5)
Glucose: 138 mg/dL — ABNORMAL HIGH (ref 70–99)
Potassium: 4.1 mmol/L (ref 3.5–5.2)
Sodium: 133 mmol/L — ABNORMAL LOW (ref 134–144)
Total Protein: 6.9 g/dL (ref 6.0–8.5)
eGFR: 125 mL/min/{1.73_m2} (ref >59)

## 2021-12-12 LAB — CBC/D/PLT+RPR+RH+ABO+RUBIGG...
Antibody Screen: NEGATIVE
Basophils Absolute: 0.1 10*3/uL (ref 0.0–0.2)
Basos: 1 %
EOS (ABSOLUTE): 0.5 10*3/uL — ABNORMAL HIGH (ref 0.0–0.4)
Eos: 5 %
HCV Ab: NONREACTIVE
HIV Screen 4th Generation wRfx: NONREACTIVE
Hematocrit: 37.4 % (ref 34.0–46.6)
Hemoglobin: 12.5 g/dL (ref 11.1–15.9)
Hepatitis B Surface Ag: NEGATIVE
Immature Grans (Abs): 0 10*3/uL (ref 0.0–0.1)
Immature Granulocytes: 0 %
Lymphocytes Absolute: 2.2 10*3/uL (ref 0.7–3.1)
Lymphs: 22 %
MCH: 28 pg (ref 26.6–33.0)
MCHC: 33.4 g/dL (ref 31.5–35.7)
MCV: 84 fL (ref 79–97)
Monocytes Absolute: 0.6 10*3/uL (ref 0.1–0.9)
Monocytes: 6 %
Neutrophils Absolute: 6.6 10*3/uL (ref 1.4–7.0)
Neutrophils: 66 %
Platelets: 331 10*3/uL (ref 150–450)
RBC: 4.47 x10E6/uL (ref 3.77–5.28)
RDW: 13.9 % (ref 11.7–15.4)
RPR Ser Ql: NONREACTIVE
Rh Factor: POSITIVE
Rubella Antibodies, IGG: 24.7 index (ref >0.99)
WBC: 10 10*3/uL (ref 3.4–10.8)

## 2021-12-12 LAB — HCV INTERPRETATION

## 2021-12-12 LAB — HEMOGLOBIN A1C
Est. average glucose Bld gHb Est-mCnc: 146 mg/dL
Hgb A1c MFr Bld: 6.7 % — ABNORMAL HIGH (ref 4.8–5.6)

## 2021-12-12 LAB — PROTEIN / CREATININE RATIO, URINE
Creatinine, Urine: 65.6 mg/dL
Protein, Ur: 8.2 mg/dL
Protein/Creat Ratio: 125 mg/g creat (ref 0–200)

## 2021-12-13 LAB — CULTURE, OB URINE

## 2021-12-13 LAB — URINE CULTURE, OB REFLEX

## 2021-12-16 LAB — PANORAMA PRENATAL TEST FULL PANEL:PANORAMA TEST PLUS 5 ADDITIONAL MICRODELETIONS: FETAL FRACTION: 7.7

## 2021-12-17 LAB — HORIZON CUSTOM: REPORT SUMMARY: NEGATIVE

## 2022-01-05 ENCOUNTER — Other Ambulatory Visit: Payer: Self-pay

## 2022-01-05 ENCOUNTER — Inpatient Hospital Stay (HOSPITAL_COMMUNITY)
Admission: AD | Admit: 2022-01-05 | Discharge: 2022-01-05 | Disposition: A | Discharge status: Home / Self Care | Payer: Medicaid Other | Attending: Obstetrics and Gynecology | Admitting: Obstetrics and Gynecology

## 2022-01-05 DIAGNOSIS — R3915 Urgency of urination: Secondary | ICD-10-CM | POA: Diagnosis not present

## 2022-01-05 DIAGNOSIS — O26892 Other specified pregnancy related conditions, second trimester: Secondary | ICD-10-CM | POA: Diagnosis not present

## 2022-01-05 DIAGNOSIS — O99210 Obesity complicating pregnancy, unspecified trimester: Secondary | ICD-10-CM

## 2022-01-05 DIAGNOSIS — Z3A18 18 weeks gestation of pregnancy: Secondary | ICD-10-CM

## 2022-01-05 DIAGNOSIS — O0990 Supervision of high risk pregnancy, unspecified, unspecified trimester: Secondary | ICD-10-CM

## 2022-01-05 DIAGNOSIS — O09522 Supervision of elderly multigravida, second trimester: Secondary | ICD-10-CM

## 2022-01-05 DIAGNOSIS — O24312 Unspecified pre-existing diabetes mellitus in pregnancy, second trimester: Secondary | ICD-10-CM | POA: Insufficient documentation

## 2022-01-05 DIAGNOSIS — O24319 Unspecified pre-existing diabetes mellitus in pregnancy, unspecified trimester: Secondary | ICD-10-CM

## 2022-01-05 DIAGNOSIS — E119 Type 2 diabetes mellitus without complications: Secondary | ICD-10-CM | POA: Insufficient documentation

## 2022-01-05 DIAGNOSIS — O0993 Supervision of high risk pregnancy, unspecified, third trimester: Secondary | ICD-10-CM | POA: Diagnosis not present

## 2022-01-05 DIAGNOSIS — Z8759 Personal history of other complications of pregnancy, childbirth and the puerperium: Secondary | ICD-10-CM

## 2022-01-05 LAB — URINALYSIS, ROUTINE W REFLEX MICROSCOPIC
Bilirubin Urine: NEGATIVE
Glucose, UA: NEGATIVE mg/dL
Hgb urine dipstick: NEGATIVE
Ketones, ur: NEGATIVE mg/dL
Leukocytes,Ua: NEGATIVE
Nitrite: NEGATIVE
Protein, ur: NEGATIVE mg/dL
Specific Gravity, Urine: 1.016 (ref 1.005–1.030)
pH: 5 (ref 5.0–8.0)

## 2022-01-05 LAB — GLUCOSE, CAPILLARY: Glucose-Capillary: 92 mg/dL (ref 70–99)

## 2022-01-05 NOTE — MAU Note (Signed)
.  Elizabeth Warren is a 41 y.o. at [redacted]w[redacted]d here in MAU reporting: urinary frequency and voiding small amount.  Reports bladder feels full, but voids small amount.Denies painful urination. Denies VB or LOF. LMP: NA Onset of complaint: 3 days Pain score: 0 Vitals:   01/05/22 1259  BP: 127/82  Pulse: 98  Resp: 19  Temp: 98.4 F (36.9 C)  SpO2: 98%     FHT:152 bpm Lab orders placed from triage:   UA

## 2022-01-05 NOTE — MAU Provider Note (Signed)
[name] is a 41 y.o. G2P5005 pregnant female at 24w3dwho presents to MAU today with complaint of increased frequency with small amounts of urine, concerned about a UTI although denies dysuria, cramping, flank pain or fever.   Receives care at [facility]. Prenatal records reviewed.  Pertinent items noted in HPI and remainder of comprehensive ROS otherwise negative.   O BP 127/82 (BP Location: Right Arm)   Pulse 98   Temp 98.4 F (36.9 C) (Oral)   Resp 19   Ht 5' (1.524 m)   Wt 192 lb 1.6 oz (87.1 kg)   LMP 08/29/2021 (Exact Date)   SpO2 98%   BMI 37.52 kg/m  Physical Exam Vitals and nursing note reviewed.  Constitutional:      General: She is not in acute distress.    Appearance: Normal appearance. She is not ill-appearing or diaphoretic.  HENT:     Head: Normocephalic and atraumatic.  Eyes:     Pupils: Pupils are equal, round, and reactive to light.  Cardiovascular:     Rate and Rhythm: Normal rate and regular rhythm.  Pulmonary:     Effort: Pulmonary effort is normal.  Abdominal:     Palpations: Abdomen is soft.  Musculoskeletal:        General: Normal range of motion.  Skin:    General: Skin is warm and dry.     Capillary Refill: Capillary refill takes less than 2 seconds.  Neurological:     Mental Status: She is alert and oriented to person, place, and time.  Psychiatric:        Mood and Affect: Mood normal.        Behavior: Behavior normal.    MDM: Straightforward  MAU Course: Ordered UA and CBG, results normal. Pt informed and reassured, encouraged to speak with MD at her next appt if this continues. Precautions given for UTI and encouraged to return to MAU if symptoms worsen.  Results for orders placed or performed during the hospital encounter of 01/05/22 (from the past 24 hour(s))  Urinalysis, Routine w reflex microscopic Urine, Clean Catch     Status: None   Collection Time: 01/05/22  1:25 PM  Result Value Ref Range   Color, Urine  YELLOW YELLOW   APPearance CLEAR CLEAR   Specific Gravity, Urine 1.016 1.005 - 1.030   pH 5.0 5.0 - 8.0   Glucose, UA NEGATIVE NEGATIVE mg/dL   Hgb urine dipstick NEGATIVE NEGATIVE   Bilirubin Urine NEGATIVE NEGATIVE   Ketones, ur NEGATIVE NEGATIVE mg/dL   Protein, ur NEGATIVE NEGATIVE mg/dL   Nitrite NEGATIVE NEGATIVE   Leukocytes,Ua NEGATIVE NEGATIVE  Glucose, capillary     Status: None   Collection Time: 01/05/22  2:15 PM  Result Value Ref Range   Glucose-Capillary 92 70 - 99 mg/dL   A Urgency of micturation [redacted] weeks gestation of pregnancy Type 2 DM Medical screening exam complete  P Discharge from MAU in stable condition with return precautions Follow up at [facility] as scheduled for ongoing prenatal care  Allergies as of 01/05/2022   No Known Allergies      Medication List     TAKE these medications    Accu-Chek Guide Me w/Device Kit 1 Units by Does not apply route every other day.   Accu-Chek Guide test strip Generic drug: glucose blood USE AS DIRECTED   Accu-Chek Softclix Lancet Dev Kit 1 kit by Does not apply route daily.   Accu-Chek Softclix Lancets lancets Use as  instructed   acetaminophen 325 MG tablet Commonly known as: Tylenol Take 2 tablets (650 mg total) by mouth every 6 (six) hours as needed.   aspirin EC 81 MG tablet Take 1 tablet (81 mg total) by mouth daily. Take after 12 weeks for prevention of preeclampsia later in pregnancy   blood glucose meter kit and supplies Kit Dispense based on patient and insurance preference. Use up to four times daily as directed.   Blood Pressure Kit Devi 1 kit by Does not apply route once a week.   Diclegis 10-10 MG Tbec Generic drug: Doxylamine-Pyridoxine Take 2 tablets by mouth at bedtime. If symptoms persist, add one tablet in the morning and one in the afternoon   dicyclomine 20 MG tablet Commonly known as: BENTYL Take 1 tablet (20 mg total) by mouth 4 (four) times daily -  before meals and at  bedtime for 14 days.   Gojji Weight Scale Misc 1 Device by Does not apply route every 30 (thirty) days.   INS SYRINGE/NEEDLE .5CC/27G 27G X 1/2" 0.5 ML Misc 1 Syringe by Does not apply route daily.   insulin glargine 100 UNIT/ML Solostar Pen Commonly known as: LANTUS Inject 20 Units into the skin daily.   Levemir 100 UNIT/ML injection Generic drug: insulin detemir Inject 0.3 mLs (30 Units total) into the skin daily.   loperamide 2 MG capsule Commonly known as: IMODIUM Take 1 capsule (2 mg total) by mouth as needed for diarrhea or loose stools.   pyridOXINE 25 MG tablet Commonly known as: VITAMIN B6 Take 1 tablet (25 mg total) by mouth daily.   UNKNOWN TO PATIENT OTC allergy med   WesTab Plus 27-1 MG Tabs Take 1 tablet by mouth daily.       Elizabeth Warren, [place] 01/06/2022 9:42 AM

## 2022-01-06 LAB — CULTURE, OB URINE

## 2022-01-07 ENCOUNTER — Encounter: Payer: Self-pay | Admitting: Obstetrics and Gynecology

## 2022-01-07 ENCOUNTER — Ambulatory Visit (INDEPENDENT_AMBULATORY_CARE_PROVIDER_SITE_OTHER): Payer: Medicaid Other | Admitting: Obstetrics and Gynecology

## 2022-01-07 VITALS — BP 134/86 | HR 98 | Wt 190.4 lb

## 2022-01-07 DIAGNOSIS — O24319 Unspecified pre-existing diabetes mellitus in pregnancy, unspecified trimester: Secondary | ICD-10-CM

## 2022-01-07 DIAGNOSIS — O0992 Supervision of high risk pregnancy, unspecified, second trimester: Secondary | ICD-10-CM

## 2022-01-07 DIAGNOSIS — Z3A18 18 weeks gestation of pregnancy: Secondary | ICD-10-CM

## 2022-01-07 DIAGNOSIS — O0990 Supervision of high risk pregnancy, unspecified, unspecified trimester: Secondary | ICD-10-CM

## 2022-01-07 DIAGNOSIS — Z8759 Personal history of other complications of pregnancy, childbirth and the puerperium: Secondary | ICD-10-CM

## 2022-01-07 DIAGNOSIS — O09522 Supervision of elderly multigravida, second trimester: Secondary | ICD-10-CM

## 2022-01-07 DIAGNOSIS — E669 Obesity, unspecified: Secondary | ICD-10-CM

## 2022-01-07 NOTE — Progress Notes (Signed)
   PRENATAL VISIT NOTE  Subjective:  Elizabeth Warren is a 41 y.o. G6P5005 at [redacted]w[redacted]d being seen today for ongoing prenatal care.  She is currently monitored for the following issues for this high-risk pregnancy and has Obesity (BMI 30-39.9); Type 2 diabetes mellitus with hyperglycemia (HCC); Low TSH level; Chronic diarrhea; S/P cholecystectomy; Irritable bowel syndrome with diarrhea; Clostridioides difficile infection; Supervision of high risk pregnancy, antepartum; Type 2 diabetes mellitus complicating pregnancy, antepartum; Maternal obesity affecting pregnancy, antepartum; Pre-existing diabetes mellitus affecting pregnancy, antepartum; AMA (advanced maternal age) multigravida 35+; and History of gestational hypertension on their problem list.  Patient reports no complaints.  Contractions: Not present. Vag. Bleeding: None.  Movement: Present. Denies leaking of fluid.   The following portions of the patient's history were reviewed and updated as appropriate: allergies, current medications, past family history, past medical history, past social history, past surgical history and problem list.   Objective:   Vitals:   01/07/22 0955  Weight: 190 lb 6.4 oz (86.4 kg)    Fetal Status:     Movement: Present     General:  Alert, oriented and cooperative. Patient is in no acute distress.  Skin: Skin is warm and dry. No rash noted.   Cardiovascular: Normal heart rate noted  Respiratory: Normal respiratory effort, no problems with respiration noted  Abdomen: Soft, gravid, appropriate for gestational age.  Pain/Pressure: Absent     Pelvic: Cervical exam deferred        Extremities: Normal range of motion.  Edema: None  Mental Status: Normal mood and affect. Normal behavior. Normal judgment and thought content.   Assessment and Plan:  Pregnancy: G6P5005 at [redacted]w[redacted]d 1. Supervision of high risk pregnancy, antepartum Patient is doing well without complaints Patient declined AFP today Anatomy ultrasound  scheduled on 12/6  2. Pre-existing diabetes mellitus affecting pregnancy, antepartum Patient reports fasting CBG 90-102 and pp 120-135 Will increase levemir to 32 units  (previously 30 units)  3. Obesity (BMI 30-39.9)   4. Multigravida of advanced maternal age in second trimester Continue ASA  5. History of gestational hypertension Stable BP  Preterm labor symptoms and general obstetric precautions including but not limited to vaginal bleeding, contractions, leaking of fluid and fetal movement were reviewed in detail with the patient. Please refer to After Visit Summary for other counseling recommendations.   No follow-ups on file.  Future Appointments  Date Time Provider Department Center  01/07/2022 10:35 AM Malachai Schalk, Gigi Gin, MD [facility] None  01/09/2022  9:15 AM [facility] NURSE [facility]  01/09/2022  9:30 AM [facility] US2 [facility]    Catalina Antigua, MD

## 2022-01-07 NOTE — Progress Notes (Signed)
Patient presents for ROB. Declines AFP. No other concerns today

## 2022-01-09 ENCOUNTER — Encounter: Payer: Self-pay | Admitting: *Deleted

## 2022-01-09 ENCOUNTER — Ambulatory Visit: Payer: Medicaid Other | Admitting: *Deleted

## 2022-01-09 ENCOUNTER — Ambulatory Visit: Payer: Medicaid Other | Attending: Obstetrics and Gynecology

## 2022-01-09 ENCOUNTER — Other Ambulatory Visit: Payer: Self-pay | Admitting: *Deleted

## 2022-01-09 VITALS — BP 115/71 | HR 90

## 2022-01-09 DIAGNOSIS — O0990 Supervision of high risk pregnancy, unspecified, unspecified trimester: Secondary | ICD-10-CM

## 2022-01-09 DIAGNOSIS — O24112 Pre-existing diabetes mellitus, type 2, in pregnancy, second trimester: Secondary | ICD-10-CM

## 2022-01-09 DIAGNOSIS — Z794 Long term (current) use of insulin: Secondary | ICD-10-CM

## 2022-01-09 DIAGNOSIS — Z8759 Personal history of other complications of pregnancy, childbirth and the puerperium: Secondary | ICD-10-CM | POA: Insufficient documentation

## 2022-01-09 DIAGNOSIS — O99210 Obesity complicating pregnancy, unspecified trimester: Secondary | ICD-10-CM | POA: Insufficient documentation

## 2022-01-09 DIAGNOSIS — E669 Obesity, unspecified: Secondary | ICD-10-CM | POA: Diagnosis not present

## 2022-01-09 DIAGNOSIS — Z363 Encounter for antenatal screening for malformations: Secondary | ICD-10-CM

## 2022-01-09 DIAGNOSIS — O09522 Supervision of elderly multigravida, second trimester: Secondary | ICD-10-CM | POA: Diagnosis not present

## 2022-01-09 DIAGNOSIS — E119 Type 2 diabetes mellitus without complications: Secondary | ICD-10-CM | POA: Diagnosis not present

## 2022-01-09 DIAGNOSIS — O09292 Supervision of pregnancy with other poor reproductive or obstetric history, second trimester: Secondary | ICD-10-CM

## 2022-01-09 DIAGNOSIS — Z3A19 19 weeks gestation of pregnancy: Secondary | ICD-10-CM | POA: Diagnosis not present

## 2022-01-09 DIAGNOSIS — O24319 Unspecified pre-existing diabetes mellitus in pregnancy, unspecified trimester: Secondary | ICD-10-CM

## 2022-01-09 DIAGNOSIS — O99212 Obesity complicating pregnancy, second trimester: Secondary | ICD-10-CM | POA: Diagnosis not present

## 2022-01-15 ENCOUNTER — Other Ambulatory Visit: Payer: Self-pay | Admitting: Emergency Medicine

## 2022-01-15 ENCOUNTER — Encounter: Payer: Self-pay | Admitting: Emergency Medicine

## 2022-01-15 DIAGNOSIS — K529 Noninfective gastroenteritis and colitis, unspecified: Secondary | ICD-10-CM

## 2022-01-15 DIAGNOSIS — E1165 Type 2 diabetes mellitus with hyperglycemia: Secondary | ICD-10-CM

## 2022-01-15 MED ORDER — LOPERAMIDE HCL 2 MG PO CAPS: 2.0000 mg | ORAL_CAPSULE | ORAL | 0 refills | Status: DC | PRN

## 2022-01-15 MED ORDER — LEVEMIR 100 UNIT/ML ~~LOC~~ SOLN: 30.0000 [IU] | Freq: Every day | SUBCUTANEOUS | 1 refills | Status: DC

## 2022-01-15 NOTE — Progress Notes (Signed)
Refill per pt request

## 2022-02-04 NOTE — L&D Delivery Note (Signed)
Labor Progress Elizabeth Warren is a 42 y.o. female [phone] with IUP at [redacted]w[redacted]d admitted for IOL for T2DM on insulin with well controlled CBGs.  She progressed with augmentation to complete and pushed less than 30 minutes to deliver.  Cord clamping delayed by 1-3 minutes then clamped by CNM and cut by pt husband.  Placenta intact and spontaneous, bleeding minimal.  First degree perineal laceration repaired without difficulty.  Mom and baby stable prior to transfer to postpartum. She plans on breastfeeding. She requests outpatient IUD for birth control.   Delivery Note At 5:39 PM a viable and healthy female was delivered via Vaginal, Spontaneous (Presentation: Left Occiput Anterior).  APGAR: 9, 9; weight 7 lb 1.6 oz (3220 g).   Placenta status: Spontaneous;Expressed, Intact.  Cord: 3 vessels with the following complications: None.    Anesthesia: Epidural Episiotomy: None Lacerations: 1st degree Suture Repair:  3.0  Est. Blood Loss (mL): 97  Mom to postpartum.  Baby to Couplet care / Skin to Skin.  Sharen Counter 06/02/2022, 8:57 AM

## 2022-02-06 ENCOUNTER — Other Ambulatory Visit: Payer: Self-pay | Admitting: *Deleted

## 2022-02-06 ENCOUNTER — Ambulatory Visit: Payer: Medicaid Other | Attending: Obstetrics and Gynecology

## 2022-02-06 ENCOUNTER — Ambulatory Visit: Payer: Medicaid Other | Admitting: *Deleted

## 2022-02-06 ENCOUNTER — Ambulatory Visit (INDEPENDENT_AMBULATORY_CARE_PROVIDER_SITE_OTHER): Payer: Medicaid Other | Admitting: Obstetrics and Gynecology

## 2022-02-06 ENCOUNTER — Encounter: Payer: Self-pay | Admitting: *Deleted

## 2022-02-06 VITALS — BP 105/71 | HR 101 | Wt 191.0 lb

## 2022-02-06 VITALS — BP 121/73 | HR 91

## 2022-02-06 DIAGNOSIS — O24319 Unspecified pre-existing diabetes mellitus in pregnancy, unspecified trimester: Secondary | ICD-10-CM

## 2022-02-06 DIAGNOSIS — Z3A23 23 weeks gestation of pregnancy: Secondary | ICD-10-CM

## 2022-02-06 DIAGNOSIS — O09522 Supervision of elderly multigravida, second trimester: Secondary | ICD-10-CM

## 2022-02-06 DIAGNOSIS — O99210 Obesity complicating pregnancy, unspecified trimester: Secondary | ICD-10-CM

## 2022-02-06 DIAGNOSIS — O24112 Pre-existing diabetes mellitus, type 2, in pregnancy, second trimester: Secondary | ICD-10-CM

## 2022-02-06 DIAGNOSIS — Z8632 Personal history of gestational diabetes: Secondary | ICD-10-CM | POA: Diagnosis not present

## 2022-02-06 DIAGNOSIS — O0990 Supervision of high risk pregnancy, unspecified, unspecified trimester: Secondary | ICD-10-CM

## 2022-02-06 DIAGNOSIS — Z794 Long term (current) use of insulin: Secondary | ICD-10-CM

## 2022-02-06 DIAGNOSIS — O99212 Obesity complicating pregnancy, second trimester: Secondary | ICD-10-CM

## 2022-02-06 DIAGNOSIS — Z8759 Personal history of other complications of pregnancy, childbirth and the puerperium: Secondary | ICD-10-CM

## 2022-02-06 DIAGNOSIS — O0992 Supervision of high risk pregnancy, unspecified, second trimester: Secondary | ICD-10-CM

## 2022-02-06 DIAGNOSIS — O24312 Unspecified pre-existing diabetes mellitus in pregnancy, second trimester: Secondary | ICD-10-CM

## 2022-02-06 DIAGNOSIS — O09292 Supervision of pregnancy with other poor reproductive or obstetric history, second trimester: Secondary | ICD-10-CM

## 2022-02-06 DIAGNOSIS — E119 Type 2 diabetes mellitus without complications: Secondary | ICD-10-CM | POA: Diagnosis not present

## 2022-02-06 NOTE — Progress Notes (Signed)
Pt needs to know if her face cream is safe in pregnancy - pt has with her today.  Pt has appt for fetal echo this Friday.

## 2022-02-06 NOTE — Patient Instructions (Signed)
Insulin = 36 units a day

## 2022-02-06 NOTE — Progress Notes (Signed)
   PRENATAL VISIT NOTE  Subjective:  Elizabeth Warren is a 42 y.o. G6P5005 at [redacted]w[redacted]d being seen today for ongoing prenatal care.  She is currently monitored for the following issues for this high-risk pregnancy and has Obesity (BMI 30-39.9); Type 2 diabetes mellitus with hyperglycemia ([place]); Low TSH level; Chronic diarrhea; S/P cholecystectomy; Irritable bowel syndrome with diarrhea; Clostridioides difficile infection; Supervision of high risk pregnancy, antepartum; Maternal obesity affecting pregnancy, antepartum; Pre-existing diabetes mellitus affecting pregnancy, antepartum; AMA (advanced maternal age) multigravida 35+; and History of gestational hypertension on their problem list.  Patient reports no complaints.  Wants to make sure her face cream is safe in pregnancy. Contractions: Not present. Vag. Bleeding: None.  Movement: Present. Denies leaking of fluid.   The following portions of the patient's history were reviewed and updated as appropriate: allergies, current medications, past family history, past medical history, past social history, past surgical history and problem list.   Objective:   Vitals:   02/06/22 0957  BP: 105/71  Pulse: (!) 101  Weight: 191 lb (86.6 kg)   Fetal Status: Fetal Heart Rate (bpm): 145   Movement: Present     General:  Alert, oriented and cooperative. Patient is in no acute distress.  Skin: Skin is warm and dry. No rash noted.   Cardiovascular: Normal heart rate noted  Respiratory: Normal respiratory effort, no problems with respiration noted  Abdomen: Soft, gravid, appropriate for gestational age.  Pain/Pressure: Absent      Assessment and Plan:  Pregnancy: G6P5005 at [redacted]w[redacted]d 1. Supervision of high risk pregnancy, antepartum RTC in 2 weeks for BG review  2. Pre-existing diabetes mellitus affecting pregnancy, antepartum Reports elevated fasting BG (low 100s) on levemir 30u qHS Increased to levemir 36u qHS Growth [place] scheduled for today Fetal echo  scheduled 1/18  3. History of gestational hypertension Normotensive ldASA  4. Multigravida of advanced maternal age in second trimester Antenatal testing as above  Preterm labor symptoms and general obstetric precautions including but not limited to vaginal bleeding, contractions, leaking of fluid and fetal movement were reviewed in detail with the patient.  Please refer to After Visit Summary for other counseling recommendations.   Return in about 2 weeks (around 02/20/2022) for return OB, follow up blood sugars.  Future Appointments  Date Time Provider [place]  02/25/2022 10:35 AM Constant, Vickii Chafe, MD [facility] None  03/06/2022 10:30 AM [facility] NURSE [facility]  03/06/2022 10:45 AM [facility] US7 [facility]   Inez Catalina, MD

## 2022-02-08 DIAGNOSIS — O24112 Pre-existing diabetes mellitus, type 2, in pregnancy, second trimester: Secondary | ICD-10-CM | POA: Diagnosis not present

## 2022-02-25 ENCOUNTER — Encounter: Payer: Self-pay | Admitting: Obstetrics and Gynecology

## 2022-02-25 ENCOUNTER — Ambulatory Visit (INDEPENDENT_AMBULATORY_CARE_PROVIDER_SITE_OTHER): Payer: Medicaid Other | Admitting: Obstetrics and Gynecology

## 2022-02-25 VITALS — BP 116/80 | HR 96 | Wt 193.2 lb

## 2022-02-25 DIAGNOSIS — O0990 Supervision of high risk pregnancy, unspecified, unspecified trimester: Secondary | ICD-10-CM

## 2022-02-25 DIAGNOSIS — O09522 Supervision of elderly multigravida, second trimester: Secondary | ICD-10-CM

## 2022-02-25 DIAGNOSIS — E1165 Type 2 diabetes mellitus with hyperglycemia: Secondary | ICD-10-CM

## 2022-02-25 DIAGNOSIS — O99212 Obesity complicating pregnancy, second trimester: Secondary | ICD-10-CM

## 2022-02-25 DIAGNOSIS — Z3A25 25 weeks gestation of pregnancy: Secondary | ICD-10-CM

## 2022-02-25 DIAGNOSIS — O24312 Unspecified pre-existing diabetes mellitus in pregnancy, second trimester: Secondary | ICD-10-CM

## 2022-02-25 DIAGNOSIS — O24319 Unspecified pre-existing diabetes mellitus in pregnancy, unspecified trimester: Secondary | ICD-10-CM

## 2022-02-25 DIAGNOSIS — Z8759 Personal history of other complications of pregnancy, childbirth and the puerperium: Secondary | ICD-10-CM

## 2022-02-25 DIAGNOSIS — O99210 Obesity complicating pregnancy, unspecified trimester: Secondary | ICD-10-CM

## 2022-02-25 MED ORDER — LEVEMIR 100 UNIT/ML ~~LOC~~ SOLN: 30.0000 [IU] | Freq: Every day | SUBCUTANEOUS | 6 refills | Status: DC

## 2022-02-25 MED ORDER — "INSULIN SYRINGE/NEEDLE 27G X 1/2"" 0.5 ML MISC": 1.0000 | Freq: Every day | 2 refills | Status: DC

## 2022-02-25 NOTE — Progress Notes (Signed)
ROB 25.[redacted] wks GA Reports taking Levemir as directed Reports diarrhea since GB removed, takes immodium Still needs Diclegis for n/v.

## 2022-02-25 NOTE — Progress Notes (Signed)
   PRENATAL VISIT NOTE  Subjective:  Elizabeth Warren is a 42 y.o. G6P5005 at [redacted]w[redacted]d being seen today for ongoing prenatal care.  She is currently monitored for the following issues for this high-risk pregnancy and has Obesity (BMI 30-39.9); Type 2 diabetes mellitus with hyperglycemia ([place]); Low TSH level; Chronic diarrhea; S/P cholecystectomy; Irritable bowel syndrome with diarrhea; Clostridioides difficile infection; Supervision of high risk pregnancy, antepartum; Maternal obesity affecting pregnancy, antepartum; Pre-existing diabetes mellitus affecting pregnancy, antepartum; AMA (advanced maternal age) multigravida 35+; and History of gestational hypertension on their problem list.  Patient reports no complaints.  Contractions: Not present. Vag. Bleeding: None.  Movement: Absent. Denies leaking of fluid.   The following portions of the patient's history were reviewed and updated as appropriate: allergies, current medications, past family history, past medical history, past social history, past surgical history and problem list.   Objective:   Vitals:   02/25/22 1006  BP: 116/80  Pulse: 96  Weight: 193 lb 3.2 oz (87.6 kg)    Fetal Status: Fetal Heart Rate (bpm): 140   Movement: Absent     General:  Alert, oriented and cooperative. Patient is in no acute distress.  Skin: Skin is warm and dry. No rash noted.   Cardiovascular: Normal heart rate noted  Respiratory: Normal respiratory effort, no problems with respiration noted  Abdomen: Soft, gravid, appropriate for gestational age.  Pain/Pressure: Absent     Pelvic: Cervical exam deferred        Extremities: Normal range of motion.  Edema: None  Mental Status: Normal mood and affect. Normal behavior. Normal judgment and thought content.   Assessment and Plan:  Pregnancy: G2I9485 at [redacted]w[redacted]d 1. Supervision of high risk pregnancy, antepartum Patient is doing well without complaints Third trimester labs next visit  2. Pre-existing diabetes  mellitus affecting pregnancy, antepartum CBG reviewed and fasting as high as 99 and pp as high as 120 Patient did not increase her insulin as previously instructed to 36 due to fear of hypoglycemia. Precautions reviewed and patient will increase to 36 units Follow up growth ultrasound 1/31 Fetal echo reported as normal  3. Obesity affecting pregnancy, antepartum, unspecified obesity type Continue ASA  4. Multigravida of advanced maternal age in second trimester Low risks screening  5. History of gestational hypertension Normotensive  Preterm labor symptoms and general obstetric precautions including but not limited to vaginal bleeding, contractions, leaking of fluid and fetal movement were reviewed in detail with the patient. Please refer to After Visit Summary for other counseling recommendations.   Return in about 2 weeks (around 03/11/2022) for in person, ROB, High risk.  Future Appointments  Date Time Provider [place]  02/25/2022 10:35 AM Olie Dibert, Vickii Chafe, MD [place] None  03/06/2022 10:30 AM [facility] NURSE [facility]  03/06/2022 10:45 AM [facility] US7 [facility] [place]    Mora Bellman, MD

## 2022-02-28 ENCOUNTER — Other Ambulatory Visit: Payer: Self-pay | Admitting: Obstetrics and Gynecology

## 2022-02-28 DIAGNOSIS — E1165 Type 2 diabetes mellitus with hyperglycemia: Secondary | ICD-10-CM

## 2022-02-28 MED ORDER — LEVEMIR 100 UNIT/ML ~~LOC~~ SOLN: 36.0000 [IU] | Freq: Every day | SUBCUTANEOUS | 6 refills | Status: DC

## 2022-03-01 ENCOUNTER — Other Ambulatory Visit: Payer: Self-pay

## 2022-03-01 DIAGNOSIS — E1165 Type 2 diabetes mellitus with hyperglycemia: Secondary | ICD-10-CM

## 2022-03-01 MED ORDER — LEVEMIR 100 UNIT/ML ~~LOC~~ SOLN: 36.0000 [IU] | Freq: Every day | SUBCUTANEOUS | 6 refills | Status: DC

## 2022-03-06 ENCOUNTER — Ambulatory Visit: Payer: Medicaid Other | Admitting: *Deleted

## 2022-03-06 ENCOUNTER — Ambulatory Visit: Payer: Medicaid Other | Attending: Obstetrics and Gynecology

## 2022-03-06 ENCOUNTER — Other Ambulatory Visit: Payer: Self-pay | Admitting: *Deleted

## 2022-03-06 VITALS — BP 112/73 | HR 97

## 2022-03-06 DIAGNOSIS — O35EXX0 Maternal care for other (suspected) fetal abnormality and damage, fetal genitourinary anomalies, not applicable or unspecified: Secondary | ICD-10-CM

## 2022-03-06 DIAGNOSIS — O09292 Supervision of pregnancy with other poor reproductive or obstetric history, second trimester: Secondary | ICD-10-CM

## 2022-03-06 DIAGNOSIS — Z362 Encounter for other antenatal screening follow-up: Secondary | ICD-10-CM

## 2022-03-06 DIAGNOSIS — Z794 Long term (current) use of insulin: Secondary | ICD-10-CM | POA: Diagnosis not present

## 2022-03-06 DIAGNOSIS — O24319 Unspecified pre-existing diabetes mellitus in pregnancy, unspecified trimester: Secondary | ICD-10-CM

## 2022-03-06 DIAGNOSIS — O24112 Pre-existing diabetes mellitus, type 2, in pregnancy, second trimester: Secondary | ICD-10-CM

## 2022-03-06 DIAGNOSIS — Z8759 Personal history of other complications of pregnancy, childbirth and the puerperium: Secondary | ICD-10-CM

## 2022-03-06 DIAGNOSIS — O99210 Obesity complicating pregnancy, unspecified trimester: Secondary | ICD-10-CM

## 2022-03-06 DIAGNOSIS — O09522 Supervision of elderly multigravida, second trimester: Secondary | ICD-10-CM | POA: Diagnosis not present

## 2022-03-06 DIAGNOSIS — O0990 Supervision of high risk pregnancy, unspecified, unspecified trimester: Secondary | ICD-10-CM

## 2022-03-06 DIAGNOSIS — E119 Type 2 diabetes mellitus without complications: Secondary | ICD-10-CM

## 2022-03-06 DIAGNOSIS — O365990 Maternal care for other known or suspected poor fetal growth, unspecified trimester, not applicable or unspecified: Secondary | ICD-10-CM

## 2022-03-06 DIAGNOSIS — O99212 Obesity complicating pregnancy, second trimester: Secondary | ICD-10-CM | POA: Diagnosis not present

## 2022-03-06 DIAGNOSIS — Z3A27 27 weeks gestation of pregnancy: Secondary | ICD-10-CM | POA: Diagnosis not present

## 2022-03-11 ENCOUNTER — Encounter: Payer: Self-pay | Admitting: Obstetrics and Gynecology

## 2022-03-11 ENCOUNTER — Ambulatory Visit (INDEPENDENT_AMBULATORY_CARE_PROVIDER_SITE_OTHER): Payer: Medicaid Other | Admitting: Obstetrics and Gynecology

## 2022-03-11 VITALS — BP 120/81 | HR 92 | Wt 192.8 lb

## 2022-03-11 DIAGNOSIS — Z8759 Personal history of other complications of pregnancy, childbirth and the puerperium: Secondary | ICD-10-CM

## 2022-03-11 DIAGNOSIS — Z3A27 27 weeks gestation of pregnancy: Secondary | ICD-10-CM

## 2022-03-11 DIAGNOSIS — O09522 Supervision of elderly multigravida, second trimester: Secondary | ICD-10-CM

## 2022-03-11 DIAGNOSIS — O99210 Obesity complicating pregnancy, unspecified trimester: Secondary | ICD-10-CM

## 2022-03-11 DIAGNOSIS — O0990 Supervision of high risk pregnancy, unspecified, unspecified trimester: Secondary | ICD-10-CM | POA: Diagnosis not present

## 2022-03-11 DIAGNOSIS — O24319 Unspecified pre-existing diabetes mellitus in pregnancy, unspecified trimester: Secondary | ICD-10-CM

## 2022-03-11 NOTE — Progress Notes (Signed)
Pt present for ROB visit. Tdap deferred to next visit.

## 2022-03-11 NOTE — Progress Notes (Signed)
   PRENATAL VISIT NOTE  Subjective:  Elizabeth Warren is a 42 y.o. G6P5005 at [redacted]w[redacted]d being seen today for ongoing prenatal care.  She is currently monitored for the following issues for this high-risk pregnancy and has Obesity (BMI 30-39.9); Type 2 diabetes mellitus with hyperglycemia ([place]); Low TSH level; Chronic diarrhea; S/P cholecystectomy; Irritable bowel syndrome with diarrhea; Clostridioides difficile infection; Supervision of high risk pregnancy, antepartum; Maternal obesity affecting pregnancy, antepartum; Pre-existing diabetes mellitus affecting pregnancy, antepartum; AMA (advanced maternal age) multigravida 35+; and History of gestational hypertension on their problem list.  Patient reports no complaints.  Contractions: Not present. Vag. Bleeding: None.  Movement: Present. Denies leaking of fluid.   The following portions of the patient's history were reviewed and updated as appropriate: allergies, current medications, past family history, past medical history, past social history, past surgical history and problem list.   Objective:   Vitals:   03/11/22 0930  BP: 120/81  Pulse: 92  Weight: 192 lb 12.8 oz (87.5 kg)    Fetal Status: Fetal Heart Rate (bpm): 154   Movement: Present     General:  Alert, oriented and cooperative. Patient is in no acute distress.  Skin: Skin is warm and dry. No rash noted.   Cardiovascular: Normal heart rate noted  Respiratory: Normal respiratory effort, no problems with respiration noted  Abdomen: Soft, gravid, appropriate for gestational age.  Pain/Pressure: Absent     Pelvic: Cervical exam deferred        Extremities: Normal range of motion.  Edema: None  Mental Status: Normal mood and affect. Normal behavior. Normal judgment and thought content.   Assessment and Plan:  Pregnancy: G2X5284 at [redacted]w[redacted]d 1. Supervision of high risk pregnancy, antepartum Patient is doing well without complaints Plans Mirena IUD for contraception and same  pediatrician Third trimester labs today  2. Obesity affecting pregnancy, antepartum, unspecified obesity type   3. Pre-existing diabetes mellitus affecting pregnancy, antepartum CBGs reviewed and well controlled on current insulin level with patient reporting highest fastin 100 with most values less than 95 and pp as high as 118 Follow up growth ultrasound and start of antenatal testing on 3/11  4. Multigravida of advanced maternal age in second trimester Low risks genetic screening  5. History of gestational hypertension Normotensive and asymptomatic  Preterm labor symptoms and general obstetric precautions including but not limited to vaginal bleeding, contractions, leaking of fluid and fetal movement were reviewed in detail with the patient. Please refer to After Visit Summary for other counseling recommendations.   Return in about 2 weeks (around 03/25/2022) for in person, ROB, High risk.  Future Appointments  Date Time Provider [place]  03/11/2022 10:55 AM Lon Klippel, Vickii Chafe, MD [place] None  03/26/2022 11:15 AM Inez Catalina, MD [facility] None  04/08/2022 10:35 AM Ege Muckey, Vickii Chafe, MD [place] None  04/15/2022  8:30 AM [facility] NURSE [facility]  04/15/2022  8:45 AM [facility] US4 [facility]  04/22/2022  8:30 AM [facility] NURSE [facility]  04/22/2022  8:45 AM [facility] US5 [facility]  04/22/2022 10:35 AM Aleen Marston, MD [facility] None  04/29/2022  8:30 AM [facility] NURSE [facility]  04/29/2022  8:45 AM [facility] US5 [facility]  05/06/2022  8:30 AM [facility] NURSE [facility]  05/06/2022  8:45 AM [facility] US5 [facility]    Peter Daquila, MD

## 2022-03-12 LAB — CBC
Hematocrit: 36 % (ref 34.0–46.6)
Hemoglobin: 12.5 g/dL (ref 11.1–15.9)
MCH: 28.6 pg (ref 26.6–33.0)
MCHC: 34.7 g/dL (ref 31.5–35.7)
MCV: 82 fL (ref 79–97)
Platelets: 319 10*3/uL (ref 150–450)
RBC: 4.37 x10E6/uL (ref 3.77–5.28)
RDW: 13.2 % (ref 11.7–15.4)
WBC: 9.7 10*3/uL (ref 3.4–10.8)

## 2022-03-12 LAB — HIV ANTIBODY (ROUTINE TESTING W REFLEX): HIV Screen 4th Generation wRfx: NONREACTIVE

## 2022-03-12 LAB — RPR: RPR Ser Ql: NONREACTIVE

## 2022-03-18 ENCOUNTER — Telehealth: Payer: Self-pay

## 2022-03-18 DIAGNOSIS — K529 Noninfective gastroenteritis and colitis, unspecified: Secondary | ICD-10-CM

## 2022-03-18 MED ORDER — LOPERAMIDE HCL 2 MG PO CAPS: 2.0000 mg | ORAL_CAPSULE | ORAL | 0 refills | Status: DC | PRN

## 2022-03-18 NOTE — Telephone Encounter (Signed)
Patient reqs rf for Imodium dx: chronic diarrhea. Refill approved and sent to the pharmacy.

## 2022-03-26 ENCOUNTER — Ambulatory Visit (INDEPENDENT_AMBULATORY_CARE_PROVIDER_SITE_OTHER): Payer: Medicaid Other | Admitting: Obstetrics and Gynecology

## 2022-03-26 ENCOUNTER — Encounter: Payer: Medicaid Other | Admitting: Obstetrics and Gynecology

## 2022-03-26 ENCOUNTER — Encounter: Payer: Self-pay | Admitting: Obstetrics and Gynecology

## 2022-03-26 VITALS — BP 117/81 | HR 98

## 2022-03-26 DIAGNOSIS — O24319 Unspecified pre-existing diabetes mellitus in pregnancy, unspecified trimester: Secondary | ICD-10-CM

## 2022-03-26 DIAGNOSIS — Z3A29 29 weeks gestation of pregnancy: Secondary | ICD-10-CM

## 2022-03-26 DIAGNOSIS — O35EXX0 Maternal care for other (suspected) fetal abnormality and damage, fetal genitourinary anomalies, not applicable or unspecified: Secondary | ICD-10-CM | POA: Insufficient documentation

## 2022-03-26 DIAGNOSIS — E1165 Type 2 diabetes mellitus with hyperglycemia: Secondary | ICD-10-CM

## 2022-03-26 DIAGNOSIS — O0993 Supervision of high risk pregnancy, unspecified, third trimester: Secondary | ICD-10-CM

## 2022-03-26 DIAGNOSIS — O24313 Unspecified pre-existing diabetes mellitus in pregnancy, third trimester: Secondary | ICD-10-CM

## 2022-03-26 DIAGNOSIS — Z8759 Personal history of other complications of pregnancy, childbirth and the puerperium: Secondary | ICD-10-CM

## 2022-03-26 DIAGNOSIS — O09523 Supervision of elderly multigravida, third trimester: Secondary | ICD-10-CM

## 2022-03-26 DIAGNOSIS — O0990 Supervision of high risk pregnancy, unspecified, unspecified trimester: Secondary | ICD-10-CM

## 2022-03-26 MED ORDER — ASPIRIN 81 MG PO TBEC: 81.0000 mg | DELAYED_RELEASE_TABLET | Freq: Every day | ORAL | 2 refills | Status: DC

## 2022-03-26 MED ORDER — "INSULIN SYRINGE/NEEDLE 27G X 1/2"" 0.5 ML MISC": 1.0000 | Freq: Every day | 2 refills | Status: AC

## 2022-03-26 MED ORDER — FERROUS SULFATE 325 (65 FE) MG PO TABS: 325.0000 mg | ORAL_TABLET | ORAL | 1 refills | Status: DC

## 2022-03-26 MED ORDER — LEVEMIR 100 UNIT/ML ~~LOC~~ SOLN: 36.0000 [IU] | Freq: Every day | SUBCUTANEOUS | 6 refills | Status: DC

## 2022-03-26 MED ORDER — WESTAB PLUS 27-1 MG PO TABS: 1.0000 | ORAL_TABLET | Freq: Every day | ORAL | 2 refills | Status: DC

## 2022-03-26 NOTE — Progress Notes (Signed)
   PRENATAL VISIT NOTE  Subjective:  JOSEPH REITH is a 42 y.o. G6P5005 at 82w6dbeing seen today for ongoing prenatal care.  She is currently monitored for the following issues for this high-risk pregnancy and has Obesity (BMI 30-39.9); Type 2 diabetes mellitus with hyperglycemia ([place]; Low TSH level; Chronic diarrhea; S/P cholecystectomy; Irritable bowel syndrome with diarrhea; Clostridioides difficile infection; Supervision of high risk pregnancy, antepartum; Maternal obesity affecting pregnancy, antepartum; Pre-existing diabetes mellitus affecting pregnancy, antepartum; AMA (advanced maternal age) multigravida 35+; and History of gestational hypertension on their problem list.  Patient reports no complaints.  Contractions: Not present. Vag. Bleeding: None.  Movement: Present. Denies leaking of fluid.   The following portions of the patient's history were reviewed and updated as appropriate: allergies, current medications, past family history, past medical history, past social history, past surgical history and problem list.   Objective:   Vitals:   03/26/22 0845  BP: 117/81  Pulse: 98    Fetal Status: Fetal Heart Rate (bpm): 145   Movement: Present     General:  Alert, oriented and cooperative. Patient is in no acute distress.  Skin: Skin is warm and dry. No rash noted.   Cardiovascular: Normal heart rate noted  Respiratory: Normal respiratory effort, no problems with respiration noted  Abdomen: Soft, gravid, appropriate for gestational age.  Pain/Pressure: Absent      Assessment and Plan:  Pregnancy: G6P5005 at 221w6d. Supervision of high risk pregnancy, antepartum 2. [redacted] weeks gestation of pregnancy Reviewed normal labs  3. Pre-existing diabetes mellitus affecting pregnancy, antepartum - Reports all fasting <100 with most being <95 and all postprandial <120. Did not bring a log for review - Continue levemir 36u QHS - @27$ /0: 902g (13%), AC 30%, post, AFI 13.5 - pt reports  growth [place] - Normal fetal echo 02/08/22  4. Multigravida of advanced maternal age in third trimester 5. History of gestational hypertension ldASA Normotensive  Return in about 2 weeks (around 04/09/2022) for high risk OB.  Future Appointments  Date Time Provider [place]3/05/2022  8:35 AM Constant, PeVickii ChafeMD [facility]  04/15/2022  8:30 AM [facility]12[facility]  8:45 AM [facility]/18/2024  8:30 AM [facility]SE [facility]/18/2024  8:45 AM [facility]/18/2024 10:35 AM Constant, Peggy, MD [facility] None  04/29/2022  8:30 AM [facility]SE [facility]/25/2024  8:45 AM [facility]/02/2022  8:30 AM [facility]/2022  8:45 AM [facility] US5 [facility]  KyInez CatalinaMD

## 2022-03-26 NOTE — Progress Notes (Signed)
ROB 29.[redacted] wks GA See MD note

## 2022-04-08 ENCOUNTER — Encounter: Payer: Self-pay | Admitting: Obstetrics and Gynecology

## 2022-04-08 ENCOUNTER — Ambulatory Visit (INDEPENDENT_AMBULATORY_CARE_PROVIDER_SITE_OTHER): Payer: Medicaid Other | Admitting: Obstetrics and Gynecology

## 2022-04-08 ENCOUNTER — Encounter: Payer: Medicaid Other | Admitting: Obstetrics and Gynecology

## 2022-04-08 VITALS — BP 114/78 | HR 89 | Wt 189.8 lb

## 2022-04-08 DIAGNOSIS — K529 Noninfective gastroenteritis and colitis, unspecified: Secondary | ICD-10-CM

## 2022-04-08 DIAGNOSIS — O24319 Unspecified pre-existing diabetes mellitus in pregnancy, unspecified trimester: Secondary | ICD-10-CM

## 2022-04-08 DIAGNOSIS — Z23 Encounter for immunization: Secondary | ICD-10-CM

## 2022-04-08 DIAGNOSIS — O99210 Obesity complicating pregnancy, unspecified trimester: Secondary | ICD-10-CM

## 2022-04-08 DIAGNOSIS — O0990 Supervision of high risk pregnancy, unspecified, unspecified trimester: Secondary | ICD-10-CM

## 2022-04-08 DIAGNOSIS — O0993 Supervision of high risk pregnancy, unspecified, third trimester: Secondary | ICD-10-CM

## 2022-04-08 DIAGNOSIS — O09523 Supervision of elderly multigravida, third trimester: Secondary | ICD-10-CM

## 2022-04-08 DIAGNOSIS — O24313 Unspecified pre-existing diabetes mellitus in pregnancy, third trimester: Secondary | ICD-10-CM

## 2022-04-08 DIAGNOSIS — E1165 Type 2 diabetes mellitus with hyperglycemia: Secondary | ICD-10-CM

## 2022-04-08 DIAGNOSIS — Z8759 Personal history of other complications of pregnancy, childbirth and the puerperium: Secondary | ICD-10-CM

## 2022-04-08 DIAGNOSIS — O99213 Obesity complicating pregnancy, third trimester: Secondary | ICD-10-CM

## 2022-04-08 MED ORDER — PANTOPRAZOLE SODIUM 40 MG PO TBEC: 40.0000 mg | DELAYED_RELEASE_TABLET | Freq: Every day | ORAL | 1 refills | Status: DC

## 2022-04-08 MED ORDER — LEVEMIR 100 UNIT/ML ~~LOC~~ SOLN: 36.0000 [IU] | Freq: Every day | SUBCUTANEOUS | 6 refills | Status: DC

## 2022-04-08 MED ORDER — LOPERAMIDE HCL 2 MG PO CAPS: 2.0000 mg | ORAL_CAPSULE | ORAL | 3 refills | Status: DC | PRN

## 2022-04-08 NOTE — Progress Notes (Signed)
Pt presents for ROB. Desires Tdap today. Reports fasting BG readings under 100, postprandials under 120. Did not bring log today.

## 2022-04-08 NOTE — Progress Notes (Signed)
   PRENATAL VISIT NOTE  Subjective:  Elizabeth Warren is a 42 y.o. G6P5005 at 22w5dbeing seen today for ongoing prenatal care.  She is currently monitored for the following issues for this high-risk pregnancy and has Obesity (BMI 30-39.9); Type 2 diabetes mellitus with hyperglycemia ([place]; Low TSH level; Chronic diarrhea; S/P cholecystectomy; Irritable bowel syndrome with diarrhea; Clostridioides difficile infection; Supervision of high risk pregnancy, antepartum; Maternal obesity affecting pregnancy, antepartum; Pre-existing diabetes mellitus affecting pregnancy, antepartum; AMA (advanced maternal age) multigravida 35+; History of gestational hypertension; and Pyelectasis of fetus on prenatal ultrasound, RESOLVED on their problem list.  Patient reports no complaints.  Contractions: Not present. Vag. Bleeding: None.  Movement: Present. Denies leaking of fluid.   The following portions of the patient's history were reviewed and updated as appropriate: allergies, current medications, past family history, past medical history, past social history, past surgical history and problem list.   Objective:   Vitals:   04/08/22 0827  BP: 114/78  Pulse: 89  Weight: 189 lb 12.8 oz (86.1 kg)    Fetal Status: Fetal Heart Rate (bpm): 135 Fundal Height: 32 cm Movement: Present     General:  Alert, oriented and cooperative. Patient is in no acute distress.  Skin: Skin is warm and dry. No rash noted.   Cardiovascular: Normal heart rate noted  Respiratory: Normal respiratory effort, no problems with respiration noted  Abdomen: Soft, gravid, appropriate for gestational age.  Pain/Pressure: Absent     Pelvic: Cervical exam deferred        Extremities: Normal range of motion.  Edema: None  Mental Status: Normal mood and affect. Normal behavior. Normal judgment and thought content.   Assessment and Plan:  Pregnancy: [id number] 380w5d. Supervision of high risk pregnancy, antepartum Patient is doing well  without complaints Tdap today  2. Pre-existing diabetes mellitus affecting pregnancy, antepartum Patient reports fasting less than 100 with majority of values raging between 82-90 and pp less than 120. She did not bring log or meter with her today for reviewed Will continue levemir at 36 units Follow up growth and antenatal testing scheduled on 3/11  3. History of gestational hypertension Normotensive Continue ASA  4. Multigravida of advanced maternal age in third trimester   5. Obesity affecting pregnancy, antepartum, unspecified obesity type   Preterm labor symptoms and general obstetric precautions including but not limited to vaginal bleeding, contractions, leaking of fluid and fetal movement were reviewed in detail with the patient. Please refer to After Visit Summary for other counseling recommendations.   Return in about 2 weeks (around 04/22/2022) for in person, ROB, High risk.  Future Appointments  Date Time Provider [place]/12/2022  8:30 AM [facility] NURSE [facility]20[facility]  8:45 AM [facility]3/18/2024  8:30 AM [facility]20[facility]  8:45 AM [facility]/18/2024 10:35 AM Ubaldo Daywalt, MD [facility] None  04/29/2022  8:30 AM [facility] NURSE [facility]3/25/2024  8:45 AM [facility] US[facility]/02/2022  8:30 AM [facility] NURSE [facility]4/02/2022  8:45 AM [facility] US5 [facility] WMBeth[place]  Macala Baldonado, MD

## 2022-04-15 ENCOUNTER — Ambulatory Visit: Payer: Medicaid Other

## 2022-04-15 ENCOUNTER — Ambulatory Visit: Payer: Medicaid Other | Attending: Maternal & Fetal Medicine

## 2022-04-15 VITALS — BP 125/82 | HR 98

## 2022-04-15 DIAGNOSIS — O0990 Supervision of high risk pregnancy, unspecified, unspecified trimester: Secondary | ICD-10-CM

## 2022-04-15 DIAGNOSIS — O24113 Pre-existing diabetes mellitus, type 2, in pregnancy, third trimester: Secondary | ICD-10-CM | POA: Diagnosis not present

## 2022-04-15 DIAGNOSIS — O365990 Maternal care for other known or suspected poor fetal growth, unspecified trimester, not applicable or unspecified: Secondary | ICD-10-CM

## 2022-04-15 DIAGNOSIS — O24319 Unspecified pre-existing diabetes mellitus in pregnancy, unspecified trimester: Secondary | ICD-10-CM

## 2022-04-15 DIAGNOSIS — O09522 Supervision of elderly multigravida, second trimester: Secondary | ICD-10-CM

## 2022-04-15 DIAGNOSIS — O09523 Supervision of elderly multigravida, third trimester: Secondary | ICD-10-CM | POA: Insufficient documentation

## 2022-04-15 DIAGNOSIS — Z8759 Personal history of other complications of pregnancy, childbirth and the puerperium: Secondary | ICD-10-CM | POA: Insufficient documentation

## 2022-04-15 DIAGNOSIS — O99210 Obesity complicating pregnancy, unspecified trimester: Secondary | ICD-10-CM | POA: Insufficient documentation

## 2022-04-15 DIAGNOSIS — O09293 Supervision of pregnancy with other poor reproductive or obstetric history, third trimester: Secondary | ICD-10-CM

## 2022-04-15 DIAGNOSIS — Z794 Long term (current) use of insulin: Secondary | ICD-10-CM | POA: Diagnosis not present

## 2022-04-15 DIAGNOSIS — Z3A32 32 weeks gestation of pregnancy: Secondary | ICD-10-CM | POA: Diagnosis not present

## 2022-04-15 DIAGNOSIS — E119 Type 2 diabetes mellitus without complications: Secondary | ICD-10-CM | POA: Diagnosis not present

## 2022-04-15 DIAGNOSIS — O35EXX0 Maternal care for other (suspected) fetal abnormality and damage, fetal genitourinary anomalies, not applicable or unspecified: Secondary | ICD-10-CM | POA: Diagnosis not present

## 2022-04-15 DIAGNOSIS — O24112 Pre-existing diabetes mellitus, type 2, in pregnancy, second trimester: Secondary | ICD-10-CM

## 2022-04-22 ENCOUNTER — Encounter: Payer: Self-pay | Admitting: Obstetrics and Gynecology

## 2022-04-22 ENCOUNTER — Ambulatory Visit: Payer: Medicaid Other | Attending: Maternal & Fetal Medicine

## 2022-04-22 ENCOUNTER — Ambulatory Visit (INDEPENDENT_AMBULATORY_CARE_PROVIDER_SITE_OTHER): Payer: Medicaid Other | Admitting: Obstetrics and Gynecology

## 2022-04-22 ENCOUNTER — Ambulatory Visit: Payer: Medicaid Other | Admitting: *Deleted

## 2022-04-22 VITALS — BP 138/84 | HR 101

## 2022-04-22 VITALS — BP 134/86 | Wt 192.0 lb

## 2022-04-22 DIAGNOSIS — Z8759 Personal history of other complications of pregnancy, childbirth and the puerperium: Secondary | ICD-10-CM

## 2022-04-22 DIAGNOSIS — O24319 Unspecified pre-existing diabetes mellitus in pregnancy, unspecified trimester: Secondary | ICD-10-CM | POA: Insufficient documentation

## 2022-04-22 DIAGNOSIS — O0990 Supervision of high risk pregnancy, unspecified, unspecified trimester: Secondary | ICD-10-CM | POA: Diagnosis not present

## 2022-04-22 DIAGNOSIS — O99210 Obesity complicating pregnancy, unspecified trimester: Secondary | ICD-10-CM | POA: Diagnosis not present

## 2022-04-22 DIAGNOSIS — O24112 Pre-existing diabetes mellitus, type 2, in pregnancy, second trimester: Secondary | ICD-10-CM | POA: Insufficient documentation

## 2022-04-22 DIAGNOSIS — O0993 Supervision of high risk pregnancy, unspecified, third trimester: Secondary | ICD-10-CM

## 2022-04-22 DIAGNOSIS — Z3A33 33 weeks gestation of pregnancy: Secondary | ICD-10-CM

## 2022-04-22 DIAGNOSIS — O24113 Pre-existing diabetes mellitus, type 2, in pregnancy, third trimester: Secondary | ICD-10-CM | POA: Diagnosis not present

## 2022-04-22 DIAGNOSIS — O99213 Obesity complicating pregnancy, third trimester: Secondary | ICD-10-CM

## 2022-04-22 DIAGNOSIS — Z794 Long term (current) use of insulin: Secondary | ICD-10-CM

## 2022-04-22 DIAGNOSIS — O09522 Supervision of elderly multigravida, second trimester: Secondary | ICD-10-CM | POA: Diagnosis not present

## 2022-04-22 DIAGNOSIS — E119 Type 2 diabetes mellitus without complications: Secondary | ICD-10-CM | POA: Diagnosis not present

## 2022-04-22 DIAGNOSIS — O35EXX0 Maternal care for other (suspected) fetal abnormality and damage, fetal genitourinary anomalies, not applicable or unspecified: Secondary | ICD-10-CM | POA: Insufficient documentation

## 2022-04-22 DIAGNOSIS — O365990 Maternal care for other known or suspected poor fetal growth, unspecified trimester, not applicable or unspecified: Secondary | ICD-10-CM | POA: Insufficient documentation

## 2022-04-22 DIAGNOSIS — O09523 Supervision of elderly multigravida, third trimester: Secondary | ICD-10-CM

## 2022-04-22 DIAGNOSIS — O09293 Supervision of pregnancy with other poor reproductive or obstetric history, third trimester: Secondary | ICD-10-CM | POA: Diagnosis not present

## 2022-04-22 DIAGNOSIS — O24313 Unspecified pre-existing diabetes mellitus in pregnancy, third trimester: Secondary | ICD-10-CM

## 2022-04-22 NOTE — Progress Notes (Signed)
ROB 33.[redacted] wks GA Does not have CBG log, reports readings are good 75-80 FBS 105-110 AC

## 2022-04-22 NOTE — Progress Notes (Addendum)
   PRENATAL VISIT NOTE  Subjective:  Elizabeth Warren is a 42 y.o. G6P5005 at [redacted]w[redacted]d being seen today for ongoing prenatal care.  She is currently monitored for the following issues for this high-risk pregnancy and has Obesity (BMI 30-39.9); Type 2 diabetes mellitus with hyperglycemia ([place]); Low TSH level; Chronic diarrhea; S/P cholecystectomy; Irritable bowel syndrome with diarrhea; Clostridioides difficile infection; Supervision of high risk pregnancy, antepartum; Maternal obesity affecting pregnancy, antepartum; Pre-existing diabetes mellitus affecting pregnancy, antepartum; AMA (advanced maternal age) multigravida 35+; History of gestational hypertension; and Pyelectasis of fetus on prenatal ultrasound, RESOLVED on their problem list.  Patient reports no complaints.  Contractions: Not present. Vag. Bleeding: None.  Movement: Present. Denies leaking of fluid.   The following portions of the patient's history were reviewed and updated as appropriate: allergies, current medications, past family history, past medical history, past social history, past surgical history and problem list.   Objective:   Vitals:   04/22/22 1034  BP: 134/86  Weight: 192 lb (87.1 kg)    Fetal Status: Fetal Heart Rate (bpm): 150 Fundal Height: 34 cm Movement: Present     General:  Alert, oriented and cooperative. Patient is in no acute distress.  Skin: Skin is warm and dry. No rash noted.   Cardiovascular: Normal heart rate noted  Respiratory: Normal respiratory effort, no problems with respiration noted  Abdomen: Soft, gravid, appropriate for gestational age.  Pain/Pressure: Absent     Pelvic: Cervical exam deferred        Extremities: Normal range of motion.  Edema: None  Mental Status: Normal mood and affect. Normal behavior. Normal judgment and thought content.   Assessment and Plan:  Pregnancy: [id number] at [redacted]w[redacted]d 1. Supervision of high risk pregnancy, antepartum Patient is doing well without  complaints Vaginal cultures next visit  2. Pre-existing diabetes mellitus affecting pregnancy, antepartum No log book or meter to review Patient reports all values are within range Continue levamir at current dosing Ultrasound today with MFM- report not yet available. Continue weekly antenatal testing   3. History of gestational hypertension Elevated BP above baseline today without symptoms. Will continue to monitor closely Patient stopped taking ASA  4. Multigravida of advanced maternal age in third trimester   5. Obesity affecting pregnancy, antepartum, unspecified obesity type   Preterm labor symptoms and general obstetric precautions including but not limited to vaginal bleeding, contractions, leaking of fluid and fetal movement were reviewed in detail with the patient. Please refer to After Visit Summary for other counseling recommendations.   Return in about 2 weeks (around 05/06/2022) for in person, ROB, High risk.  Future Appointments  Date Time Provider [place]  04/29/2022  8:30 AM [facility] NURSE [facility]  04/29/2022  8:45 AM [facility] US5 [facility]  05/06/2022  8:30 AM [facility] NURSE [facility]  05/06/2022  8:45 AM [facility] US5 [facility]  05/15/2022  8:15 AM [facility] NST [facility]  05/22/2022  8:15 AM [facility] NST [facility]  05/27/2022  8:15 AM [facility] NST [facility]    Kendel Bessey, MD

## 2022-04-29 ENCOUNTER — Ambulatory Visit: Payer: Medicaid Other | Attending: Maternal & Fetal Medicine

## 2022-04-29 ENCOUNTER — Ambulatory Visit: Payer: Medicaid Other | Admitting: *Deleted

## 2022-04-29 VITALS — BP 112/74 | HR 100

## 2022-04-29 DIAGNOSIS — O24113 Pre-existing diabetes mellitus, type 2, in pregnancy, third trimester: Secondary | ICD-10-CM | POA: Diagnosis not present

## 2022-04-29 DIAGNOSIS — O24112 Pre-existing diabetes mellitus, type 2, in pregnancy, second trimester: Secondary | ICD-10-CM | POA: Insufficient documentation

## 2022-04-29 DIAGNOSIS — Z794 Long term (current) use of insulin: Secondary | ICD-10-CM | POA: Diagnosis not present

## 2022-04-29 DIAGNOSIS — Z3A34 34 weeks gestation of pregnancy: Secondary | ICD-10-CM

## 2022-04-29 DIAGNOSIS — O09523 Supervision of elderly multigravida, third trimester: Secondary | ICD-10-CM | POA: Insufficient documentation

## 2022-04-29 DIAGNOSIS — O0990 Supervision of high risk pregnancy, unspecified, unspecified trimester: Secondary | ICD-10-CM

## 2022-04-29 DIAGNOSIS — O99210 Obesity complicating pregnancy, unspecified trimester: Secondary | ICD-10-CM | POA: Diagnosis not present

## 2022-04-29 DIAGNOSIS — O35EXX0 Maternal care for other (suspected) fetal abnormality and damage, fetal genitourinary anomalies, not applicable or unspecified: Secondary | ICD-10-CM | POA: Diagnosis not present

## 2022-04-29 DIAGNOSIS — Z8759 Personal history of other complications of pregnancy, childbirth and the puerperium: Secondary | ICD-10-CM

## 2022-04-29 DIAGNOSIS — O365990 Maternal care for other known or suspected poor fetal growth, unspecified trimester, not applicable or unspecified: Secondary | ICD-10-CM | POA: Diagnosis not present

## 2022-04-29 DIAGNOSIS — O24319 Unspecified pre-existing diabetes mellitus in pregnancy, unspecified trimester: Secondary | ICD-10-CM | POA: Diagnosis not present

## 2022-04-29 DIAGNOSIS — O09522 Supervision of elderly multigravida, second trimester: Secondary | ICD-10-CM | POA: Diagnosis not present

## 2022-04-29 DIAGNOSIS — O09293 Supervision of pregnancy with other poor reproductive or obstetric history, third trimester: Secondary | ICD-10-CM

## 2022-04-29 DIAGNOSIS — E119 Type 2 diabetes mellitus without complications: Secondary | ICD-10-CM

## 2022-05-06 ENCOUNTER — Ambulatory Visit: Payer: Medicaid Other | Attending: Maternal & Fetal Medicine

## 2022-05-06 ENCOUNTER — Ambulatory Visit: Payer: Medicaid Other | Admitting: *Deleted

## 2022-05-06 VITALS — BP 116/77 | HR 100

## 2022-05-06 DIAGNOSIS — E669 Obesity, unspecified: Secondary | ICD-10-CM | POA: Diagnosis not present

## 2022-05-06 DIAGNOSIS — O0990 Supervision of high risk pregnancy, unspecified, unspecified trimester: Secondary | ICD-10-CM | POA: Diagnosis not present

## 2022-05-06 DIAGNOSIS — O0943 Supervision of pregnancy with grand multiparity, third trimester: Secondary | ICD-10-CM | POA: Diagnosis not present

## 2022-05-06 DIAGNOSIS — O365990 Maternal care for other known or suspected poor fetal growth, unspecified trimester, not applicable or unspecified: Secondary | ICD-10-CM | POA: Diagnosis not present

## 2022-05-06 DIAGNOSIS — O24113 Pre-existing diabetes mellitus, type 2, in pregnancy, third trimester: Secondary | ICD-10-CM | POA: Diagnosis not present

## 2022-05-06 DIAGNOSIS — O99210 Obesity complicating pregnancy, unspecified trimester: Secondary | ICD-10-CM

## 2022-05-06 DIAGNOSIS — O24319 Unspecified pre-existing diabetes mellitus in pregnancy, unspecified trimester: Secondary | ICD-10-CM | POA: Diagnosis not present

## 2022-05-06 DIAGNOSIS — E119 Type 2 diabetes mellitus without complications: Secondary | ICD-10-CM | POA: Diagnosis not present

## 2022-05-06 DIAGNOSIS — O09522 Supervision of elderly multigravida, second trimester: Secondary | ICD-10-CM | POA: Diagnosis not present

## 2022-05-06 DIAGNOSIS — Z8759 Personal history of other complications of pregnancy, childbirth and the puerperium: Secondary | ICD-10-CM

## 2022-05-06 DIAGNOSIS — Z3A35 35 weeks gestation of pregnancy: Secondary | ICD-10-CM

## 2022-05-06 DIAGNOSIS — O09523 Supervision of elderly multigravida, third trimester: Secondary | ICD-10-CM | POA: Diagnosis not present

## 2022-05-06 DIAGNOSIS — O09293 Supervision of pregnancy with other poor reproductive or obstetric history, third trimester: Secondary | ICD-10-CM

## 2022-05-06 DIAGNOSIS — O99213 Obesity complicating pregnancy, third trimester: Secondary | ICD-10-CM | POA: Diagnosis not present

## 2022-05-06 DIAGNOSIS — O24112 Pre-existing diabetes mellitus, type 2, in pregnancy, second trimester: Secondary | ICD-10-CM

## 2022-05-06 DIAGNOSIS — O35EXX0 Maternal care for other (suspected) fetal abnormality and damage, fetal genitourinary anomalies, not applicable or unspecified: Secondary | ICD-10-CM

## 2022-05-07 ENCOUNTER — Ambulatory Visit (INDEPENDENT_AMBULATORY_CARE_PROVIDER_SITE_OTHER): Payer: Medicaid Other | Admitting: Obstetrics & Gynecology

## 2022-05-07 ENCOUNTER — Other Ambulatory Visit (HOSPITAL_COMMUNITY)
Admission: RE | Admit: 2022-05-07 | Discharge: 2022-05-07 | Disposition: A | Discharge status: Home / Self Care | Payer: Medicaid Other | Source: Ambulatory Visit | Attending: Obstetrics & Gynecology | Admitting: Obstetrics & Gynecology

## 2022-05-07 VITALS — BP 109/76 | HR 102 | Wt 193.0 lb

## 2022-05-07 DIAGNOSIS — O35EXX0 Maternal care for other (suspected) fetal abnormality and damage, fetal genitourinary anomalies, not applicable or unspecified: Secondary | ICD-10-CM

## 2022-05-07 DIAGNOSIS — O0990 Supervision of high risk pregnancy, unspecified, unspecified trimester: Secondary | ICD-10-CM | POA: Insufficient documentation

## 2022-05-07 DIAGNOSIS — O99210 Obesity complicating pregnancy, unspecified trimester: Secondary | ICD-10-CM

## 2022-05-07 DIAGNOSIS — Z3A35 35 weeks gestation of pregnancy: Secondary | ICD-10-CM

## 2022-05-07 DIAGNOSIS — O09523 Supervision of elderly multigravida, third trimester: Secondary | ICD-10-CM

## 2022-05-07 DIAGNOSIS — O24319 Unspecified pre-existing diabetes mellitus in pregnancy, unspecified trimester: Secondary | ICD-10-CM

## 2022-05-07 NOTE — Progress Notes (Signed)
Pt states glucose has been normal at home.

## 2022-05-07 NOTE — Progress Notes (Signed)
   PRENATAL VISIT NOTE  Subjective:  Elizabeth Warren is a 42 y.o. G6P5005 at [redacted]w[redacted]d being seen today for ongoing prenatal care.  She is currently monitored for the following issues for this high-risk pregnancy and has Obesity (BMI 30-39.9); Type 2 diabetes mellitus with hyperglycemia; Low TSH level; Chronic diarrhea; S/P cholecystectomy; Irritable bowel syndrome with diarrhea; Clostridioides difficile infection; Supervision of high risk pregnancy, antepartum; Maternal obesity affecting pregnancy, antepartum; Pre-existing diabetes mellitus affecting pregnancy, antepartum; AMA (advanced maternal age) multigravida 35+; History of gestational hypertension; and Pyelectasis of fetus on prenatal ultrasound, RESOLVED on their problem list.  Patient reports no complaints.  Contractions: Irregular. Vag. Bleeding: None.  Movement: Present. Denies leaking of fluid.   The following portions of the patient's history were reviewed and updated as appropriate: allergies, current medications, past family history, past medical history, past social history, past surgical history and problem list.   Objective:   Vitals:   05/07/22 0815  BP: 109/76  Pulse: (!) 102  Weight: 193 lb (87.5 kg)    Fetal Status: Fetal Heart Rate (bpm): 150   Movement: Present     General:  Alert, oriented and cooperative. Patient is in no acute distress.  Skin: Skin is warm and dry. No rash noted.   Cardiovascular: Normal heart rate noted  Respiratory: Normal respiratory effort, no problems with respiration noted  Abdomen: Soft, gravid, appropriate for gestational age.  Pain/Pressure: Absent     Pelvic: Cervical exam performed in the presence of a chaperone Dilation: Closed Effacement (%): 20 Station: Ballotable  Extremities: Normal range of motion.     Mental Status: Normal mood and affect. Normal behavior. Normal judgment and thought content.   Assessment and Plan:  Pregnancy: [id number] at [redacted]w[redacted]d 1. Supervision of high risk  pregnancy, antepartum Good fetal surveillance  2. Pre-existing diabetes mellitus affecting pregnancy, antepartum States BG in range  3. Multigravida of advanced maternal age in third trimester   4. Obesity affecting pregnancy, antepartum, unspecified obesity type   5. Pyelectasis of fetus on prenatal ultrasound, RESOLVED Notify peds for evaluation   Preterm labor symptoms and general obstetric precautions including but not limited to vaginal bleeding, contractions, leaking of fluid and fetal movement were reviewed in detail with the patient. Please refer to After Visit Summary for other counseling recommendations.   Return in about 1 week (around 05/14/2022).  Future Appointments  Date Time Provider [place]  05/15/2022  8:15 AM [facility] NST [facility]  05/15/2022  2:30 PM Chancy Milroy, MD [place] None  05/22/2022  8:15 AM [facility] NST [facility]  05/27/2022  8:15 AM [facility] NST [facility]    Emeterio Reeve, MD

## 2022-05-08 LAB — CERVICOVAGINAL ANCILLARY ONLY
Chlamydia: NEGATIVE
Comment: NEGATIVE
Comment: NEGATIVE
Comment: NORMAL
Neisseria Gonorrhea: NEGATIVE
Trichomonas: NEGATIVE

## 2022-05-11 LAB — CULTURE, BETA STREP (GROUP B ONLY): Strep Gp B Culture: NEGATIVE

## 2022-05-13 ENCOUNTER — Ambulatory Visit: Payer: Medicaid Other

## 2022-05-14 ENCOUNTER — Encounter: Payer: Self-pay | Admitting: Advanced Practice Midwife

## 2022-05-14 ENCOUNTER — Ambulatory Visit (INDEPENDENT_AMBULATORY_CARE_PROVIDER_SITE_OTHER): Payer: Medicaid Other | Admitting: Advanced Practice Midwife

## 2022-05-14 VITALS — BP 120/82 | HR 94 | Wt 195.0 lb

## 2022-05-14 DIAGNOSIS — O35EXX0 Maternal care for other (suspected) fetal abnormality and damage, fetal genitourinary anomalies, not applicable or unspecified: Secondary | ICD-10-CM

## 2022-05-14 DIAGNOSIS — O0990 Supervision of high risk pregnancy, unspecified, unspecified trimester: Secondary | ICD-10-CM

## 2022-05-14 DIAGNOSIS — Z3A36 36 weeks gestation of pregnancy: Secondary | ICD-10-CM

## 2022-05-14 DIAGNOSIS — O09523 Supervision of elderly multigravida, third trimester: Secondary | ICD-10-CM

## 2022-05-14 DIAGNOSIS — Z8759 Personal history of other complications of pregnancy, childbirth and the puerperium: Secondary | ICD-10-CM

## 2022-05-14 DIAGNOSIS — O0993 Supervision of high risk pregnancy, unspecified, third trimester: Secondary | ICD-10-CM

## 2022-05-14 DIAGNOSIS — O24319 Unspecified pre-existing diabetes mellitus in pregnancy, unspecified trimester: Secondary | ICD-10-CM

## 2022-05-14 NOTE — Progress Notes (Addendum)
   PRENATAL VISIT NOTE  Subjective:  Elizabeth Warren is a 42 y.o. G6P5005 at [redacted]w[redacted]d being seen today for ongoing prenatal care.  She is currently monitored for the following issues for this high-risk pregnancy and has Obesity (BMI 30-39.9); Type 2 diabetes mellitus with hyperglycemia; Low TSH level; Chronic diarrhea; S/P cholecystectomy; Irritable bowel syndrome with diarrhea; Clostridioides difficile infection; Supervision of high risk pregnancy, antepartum; Maternal obesity affecting pregnancy, antepartum; Pre-existing diabetes mellitus affecting pregnancy, antepartum; AMA (advanced maternal age) multigravida 35+; History of gestational hypertension; and Pyelectasis of fetus on prenatal ultrasound, RESOLVED on their problem list.  Patient reports no complaints.  Contractions: Irritability. Vag. Bleeding: None.  Movement: Present. Denies leaking of fluid.   The following portions of the patient's history were reviewed and updated as appropriate: allergies, current medications, past family history, past medical history, past social history, past surgical history and problem list.   Objective:   Vitals:   05/14/22 1312  BP: 120/82  Pulse: 94  Weight: 195 lb (88.5 kg)    Fetal Status: Fetal Heart Rate (bpm): 148 Fundal Height: 37 cm Movement: Present     General:  Alert, oriented and cooperative. Patient is in no acute distress.  Skin: Skin is warm and dry. No rash noted.   Cardiovascular: Normal heart rate noted  Respiratory: Normal respiratory effort, no problems with respiration noted  Abdomen: Soft, gravid, appropriate for gestational age.  Pain/Pressure: Absent     Pelvic: Cervical exam deferred        Extremities: Normal range of motion.  Edema: None  Mental Status: Normal mood and affect. Normal behavior. Normal judgment and thought content.   Assessment and Plan:  Pregnancy: G6P5005 at [redacted]w[redacted]d 1. Supervision of high risk pregnancy, antepartum --Anticipatory guidance about next  visits/weeks of pregnancy given.   2. Multigravida of advanced maternal age in third trimester   3. Pyelectasis of fetus on prenatal ultrasound, RESOLVED --Follow up with Peds, as seen on some imaging, not on others  4. History of gestational hypertension --Normotensive this pregnancy  5. Pre-existing diabetes mellitus affecting pregnancy, antepartum --Pt on Levemir 36u  --Well controlled, pt without log but reports fasting values all below 90 and PP below 120. Discussed her diet changes along with insulin. --Weekly testing until delivery, IOL at 39 weeks scheduled, orders placed --Pt notified CNM that she was told by MFM that she no longer needed weekly antenatal testing.  She is not planning to go to [facility] tomorrow as today is the last day of Ramadan and tomorrow she is cooking and having meal with family.   --[place] notes indicate weekly testing until delivery --Consult MFM, message sent to Dr Judeth Cornfield regarding need for testing   Preterm labor symptoms and general obstetric precautions including but not limited to vaginal bleeding, contractions, leaking of fluid and fetal movement were reviewed in detail with the patient. Please refer to After Visit Summary for other counseling recommendations.   Return in about 1 week (around 05/21/2022) for HROB.  Future Appointments  Date Time Provider Department Center  05/15/2022  8:15 AM [facility] NST [facility]  05/21/2022  9:55 AM Warden Fillers, MD [facility] None  05/22/2022  8:15 AM [facility] NST [facility] [address]  05/27/2022  8:15 AM [facility] NST [facility]  05/30/2022  6:45 AM [facility] SCHED ROOM [facility] None    Sharen Counter, CNM

## 2022-05-14 NOTE — Progress Notes (Signed)
Pt presents for ROB visit. Pt has questions about inductions. No other concerns.

## 2022-05-15 ENCOUNTER — Encounter: Payer: Medicaid Other | Admitting: Obstetrics and Gynecology

## 2022-05-15 ENCOUNTER — Other Ambulatory Visit: Payer: Medicaid Other

## 2022-05-15 ENCOUNTER — Telehealth: Payer: Self-pay | Admitting: *Deleted

## 2022-05-15 NOTE — Telephone Encounter (Signed)
TC to clarify need for pt to continue MFM weekly visits until delivery. Pt previously reported to CNM that she was told she no longer needed the weekly appointments. Pt is unable to go to today's appt due to religious celebration, but is amenable to an appointment Friday. Pt is already scheduled for 4/17 and 4/22 and for delivery on 4/25. Pt verbalized understanding. Advised pt we would contact her with new appt info.

## 2022-05-16 ENCOUNTER — Ambulatory Visit (INDEPENDENT_AMBULATORY_CARE_PROVIDER_SITE_OTHER): Payer: Medicaid Other | Admitting: General Practice

## 2022-05-16 ENCOUNTER — Other Ambulatory Visit (INDEPENDENT_AMBULATORY_CARE_PROVIDER_SITE_OTHER): Payer: Medicaid Other

## 2022-05-16 VITALS — BP 120/74 | HR 106

## 2022-05-16 DIAGNOSIS — Z3A37 37 weeks gestation of pregnancy: Secondary | ICD-10-CM

## 2022-05-16 DIAGNOSIS — O24313 Unspecified pre-existing diabetes mellitus in pregnancy, third trimester: Secondary | ICD-10-CM | POA: Diagnosis not present

## 2022-05-16 DIAGNOSIS — O24319 Unspecified pre-existing diabetes mellitus in pregnancy, unspecified trimester: Secondary | ICD-10-CM

## 2022-05-16 NOTE — Progress Notes (Signed)
Pt informed that the ultrasound is considered a limited OB ultrasound and is not intended to be a complete ultrasound exam.  Patient also informed that the ultrasound is not being completed with the intent of assessing for fetal or placental anomalies or any pelvic abnormalities.  Explained that the purpose of today's ultrasound is to assess for  BPP, presentation, and AFI.  Patient acknowledges the purpose of the exam and the limitations of the study.     Willet Schleifer H RN BSN 05/16/22  

## 2022-05-17 ENCOUNTER — Encounter (HOSPITAL_COMMUNITY): Payer: Self-pay | Admitting: Obstetrics and Gynecology

## 2022-05-17 ENCOUNTER — Inpatient Hospital Stay (HOSPITAL_COMMUNITY)
Admission: AD | Admit: 2022-05-17 | Discharge: 2022-05-17 | Disposition: A | Discharge status: Home / Self Care | Payer: Medicaid Other | Attending: Obstetrics and Gynecology | Admitting: Obstetrics and Gynecology

## 2022-05-17 DIAGNOSIS — Z3A37 37 weeks gestation of pregnancy: Secondary | ICD-10-CM | POA: Diagnosis not present

## 2022-05-17 DIAGNOSIS — O479 False labor, unspecified: Secondary | ICD-10-CM | POA: Diagnosis not present

## 2022-05-17 DIAGNOSIS — O471 False labor at or after 37 completed weeks of gestation: Secondary | ICD-10-CM | POA: Insufficient documentation

## 2022-05-17 DIAGNOSIS — O99210 Obesity complicating pregnancy, unspecified trimester: Secondary | ICD-10-CM

## 2022-05-17 DIAGNOSIS — O0990 Supervision of high risk pregnancy, unspecified, unspecified trimester: Secondary | ICD-10-CM

## 2022-05-17 DIAGNOSIS — O09523 Supervision of elderly multigravida, third trimester: Secondary | ICD-10-CM

## 2022-05-17 DIAGNOSIS — Z8759 Personal history of other complications of pregnancy, childbirth and the puerperium: Secondary | ICD-10-CM

## 2022-05-17 DIAGNOSIS — O24319 Unspecified pre-existing diabetes mellitus in pregnancy, unspecified trimester: Secondary | ICD-10-CM

## 2022-05-17 NOTE — MAU Note (Signed)
...  Elizabeth Warren is a 42 y.o. at [redacted]w[redacted]d here in MAU reporting: CTX since yesterday. She reports they are now every 10-20 minutes. Denies VB or LOF. +FM.   T2DM. GBS-. Take Levemir at night.  Onset of complaint: Yesterday Pain score: 6/10 lower abdomen  FHT: initial external Lab orders placed from triage:  MAU Labor Eval

## 2022-05-17 NOTE — Progress Notes (Signed)
S: Labor check  O: BP 125/84 (BP Location: Right Arm)   Pulse 99   Temp 98.6 F (37 C) (Oral)   Resp 15   Ht 5' (1.524 m)   Wt 87.9 kg   LMP 08/29/2021 (Exact Date)   SpO2 100%   BMI 37.83 kg/m   Cervical exam:  Dilation: 1.5 (per pt request) Effacement (%): 90 Station: -2 Presentation: Vertex Exam by:: [employer], RN   Fetal Monitoring: Baseline: 140 bpm Variability: Moderat Accelerations: Present Decelerations: Absent Contractions: Irritability   A: SIUP at [redacted]w[redacted]d  False labor  P: -Return precuations  Lavonda Jumbo, DO 05/17/2022 8:49 PM

## 2022-05-17 NOTE — Progress Notes (Signed)
I have communicated with Dr. Salvadore Dom and reviewed vital signs:  Vitals:   05/17/22 1853 05/17/22 2054  BP: 125/84 117/68  Pulse: 99 99  Resp: 15   Temp: 98.6 F (37 C)   SpO2: 100%     Vaginal exam:  Dilation: 1.5 (per pt request) Effacement (%): 90 Station: -2 Presentation: Vertex Exam by:: [employer], RN,   Also reviewed contraction pattern and that non-stress test is reactive.  It has been documented that patient is contracting occasionally with no cervical change over 1 hour not indicating active labor.  Patient denies any other complaints.  Based on this report provider has given order for discharge.  A discharge order and diagnosis entered by a provider.   Labor discharge instructions reviewed with patient.

## 2022-05-19 ENCOUNTER — Inpatient Hospital Stay (HOSPITAL_COMMUNITY)
Admission: AD | Admit: 2022-05-19 | Discharge: 2022-05-19 | Disposition: A | Discharge status: Home / Self Care | Payer: Medicaid Other | Attending: Obstetrics & Gynecology | Admitting: Obstetrics & Gynecology

## 2022-05-19 ENCOUNTER — Other Ambulatory Visit: Payer: Self-pay

## 2022-05-19 DIAGNOSIS — O471 False labor at or after 37 completed weeks of gestation: Secondary | ICD-10-CM | POA: Insufficient documentation

## 2022-05-19 DIAGNOSIS — Z3689 Encounter for other specified antenatal screening: Secondary | ICD-10-CM

## 2022-05-19 DIAGNOSIS — O479 False labor, unspecified: Secondary | ICD-10-CM | POA: Diagnosis not present

## 2022-05-19 DIAGNOSIS — Z3A37 37 weeks gestation of pregnancy: Secondary | ICD-10-CM | POA: Diagnosis not present

## 2022-05-19 NOTE — MAU Provider Note (Signed)
Per RN: Ms. MARCITA MALCOLM is a 42 y.o. G6P5005 at [redacted]w[redacted]d  who presents to MAU today complaining contractions q 1hr. No VB or LOF. +FM.   O: BP 127/75 (BP Location: Right Arm)   Pulse 100   Temp 98 F (36.7 C) (Oral)   Resp 16   LMP 08/29/2021 (Exact Date)   SpO2 100%   Cervical exam:  Dilation: 1 Effacement (%): Thick Station: Ballotable Exam by:: Valla Leaver, RN  Fetal Monitoring: Baseline: 150 Variability: mod Accelerations: + Decelerations: no Contractions: rare   A: SIUP at [redacted]w[redacted]d  False labor Reactive NST  P: Discharge home Follow up @[facility]  as scheduled Labor precautions  Donette Larry, CNM 05/19/2022 6:45 PM

## 2022-05-19 NOTE — MAU Note (Signed)
.  Elizabeth Warren is a 42 y.o. at [redacted]w[redacted]d here in MAU reporting: ctx once an hour. Denies LOF, or vag bleeding. +FM Pain score: 7 Vitals:   05/19/22 1822  BP: 127/75  Pulse: 100  Resp: 16  Temp: 98 F (36.7 C)  SpO2: 100%     FHT:135 Lab orders placed from triage:

## 2022-05-20 ENCOUNTER — Encounter: Payer: Self-pay | Admitting: *Deleted

## 2022-05-20 ENCOUNTER — Ambulatory Visit (INDEPENDENT_AMBULATORY_CARE_PROVIDER_SITE_OTHER): Payer: Medicaid Other | Admitting: Advanced Practice Midwife

## 2022-05-20 ENCOUNTER — Encounter: Payer: Self-pay | Admitting: Advanced Practice Midwife

## 2022-05-20 VITALS — BP 141/92 | HR 103 | Wt 194.4 lb

## 2022-05-20 DIAGNOSIS — R030 Elevated blood-pressure reading, without diagnosis of hypertension: Secondary | ICD-10-CM | POA: Diagnosis not present

## 2022-05-20 DIAGNOSIS — O09523 Supervision of elderly multigravida, third trimester: Secondary | ICD-10-CM

## 2022-05-20 DIAGNOSIS — O24313 Unspecified pre-existing diabetes mellitus in pregnancy, third trimester: Secondary | ICD-10-CM

## 2022-05-20 DIAGNOSIS — O99210 Obesity complicating pregnancy, unspecified trimester: Secondary | ICD-10-CM

## 2022-05-20 DIAGNOSIS — O0990 Supervision of high risk pregnancy, unspecified, unspecified trimester: Secondary | ICD-10-CM

## 2022-05-20 DIAGNOSIS — O0993 Supervision of high risk pregnancy, unspecified, third trimester: Secondary | ICD-10-CM

## 2022-05-20 DIAGNOSIS — Z3A37 37 weeks gestation of pregnancy: Secondary | ICD-10-CM

## 2022-05-20 DIAGNOSIS — O24319 Unspecified pre-existing diabetes mellitus in pregnancy, unspecified trimester: Secondary | ICD-10-CM

## 2022-05-20 DIAGNOSIS — O99213 Obesity complicating pregnancy, third trimester: Secondary | ICD-10-CM

## 2022-05-20 DIAGNOSIS — Z8759 Personal history of other complications of pregnancy, childbirth and the puerperium: Secondary | ICD-10-CM

## 2022-05-20 NOTE — Progress Notes (Signed)
   PRENATAL VISIT NOTE  Subjective:  Elizabeth Warren is a 42 y.o. G6P5005 at [redacted]w[redacted]d being seen today for ongoing prenatal care.  She is currently monitored for the following issues for this high-risk pregnancy and has Obesity (BMI 30-39.9); Type 2 diabetes mellitus with hyperglycemia; Low TSH level; Chronic diarrhea; S/P cholecystectomy; Irritable bowel syndrome with diarrhea; Clostridioides difficile infection; Supervision of high risk pregnancy, antepartum; Maternal obesity affecting pregnancy, antepartum; Pre-existing diabetes mellitus affecting pregnancy, antepartum; AMA (advanced maternal age) multigravida 35+; History of gestational hypertension; and Pyelectasis of fetus on prenatal ultrasound, RESOLVED on their problem list.  Patient reports  episodes of contractions that are regular and painful but resolve .  Contractions: Irregular.  .  Movement: Present. Denies leaking of fluid.   The following portions of the patient's history were reviewed and updated as appropriate: allergies, current medications, past family history, past medical history, past social history, past surgical history and problem list.   Objective:   Vitals:   05/20/22 1407 05/20/22 1413 05/20/22 1517  BP: (!) 132/94 123/84 (!) 141/92  Pulse: 99 (!) 102 (!) 103  Weight: 194 lb 6.4 oz (88.2 kg)      Fetal Status: Fetal Heart Rate (bpm): 161 Fundal Height: 38 cm Movement: Present     General:  Alert, oriented and cooperative. Patient is in no acute distress.  Skin: Skin is warm and dry. No rash noted.   Cardiovascular: Normal heart rate noted  Respiratory: Normal respiratory effort, no problems with respiration noted  Abdomen: Soft, gravid, appropriate for gestational age.  Pain/Pressure: Present     Pelvic: Cervical exam deferred        Extremities: Normal range of motion.  Edema: Trace  Mental Status: Normal mood and affect. Normal behavior. Normal judgment and thought content.   Assessment and Plan:   Pregnancy: G6P5005 at [redacted]w[redacted]d 1. Supervision of high risk pregnancy, antepartum --Anticipatory guidance about next visits/weeks of pregnancy given.   2. Obesity affecting pregnancy, antepartum, unspecified obesity type   3. Pre-existing diabetes mellitus affecting pregnancy, antepartum --Blood glucose values all within range on current insulin regimen. Continue current course. IOL scheduled at 39 weeks.  See HTN below.  4. Multigravida of advanced maternal age in third trimester   5. History of gestational hypertension   6. Elevated blood pressure reading without diagnosis of hypertension --BP elevated 130s/90s, then 140s/90s in office today. No HTN before today in this pregnancy.  --No s/sx of PEC --Consult Dr Adrian Blackwater with pt history and today's BP. Recheck BP in 2 days in office, IOL if elevated. Labs in office today as well.  --Pt given home cuff today and virtual visit for BP (at pt request) on 4/17  --Daily BP and if persistently elevated, or if s/sx of PEC, go to MAU  - CBC - Comp Met (CMET) - Protein / creatinine ratio, urine  Term labor symptoms and general obstetric precautions including but not limited to vaginal bleeding, contractions, leaking of fluid and fetal movement were reviewed in detail with the patient. Please refer to After Visit Summary for other counseling recommendations.   No follow-ups on file.  Future Appointments  Date Time Provider Department Center  05/22/2022  8:15 AM [facility] NST [facility]  05/24/2022 10:20 AM [facility] NURSE [facility] None  05/27/2022  8:15 AM [facility] NST [facility]  05/29/2022  1:30 PM Warden Fillers, MD [facility] None  05/30/2022  6:45 AM [facility] SCHED ROOM [facility] None    Sharen Counter, CNM

## 2022-05-20 NOTE — Progress Notes (Signed)
Pt presents for ROB visit. Pt was recently seen in MAU for contractions. Pt still having contractions. Pt requesting earlier induction.

## 2022-05-21 ENCOUNTER — Encounter (HOSPITAL_COMMUNITY): Payer: Self-pay | Admitting: *Deleted

## 2022-05-21 ENCOUNTER — Telehealth (HOSPITAL_COMMUNITY): Payer: Self-pay | Admitting: *Deleted

## 2022-05-21 ENCOUNTER — Encounter: Payer: Medicaid Other | Admitting: Obstetrics and Gynecology

## 2022-05-21 NOTE — Telephone Encounter (Signed)
Preadmission screen  

## 2022-05-22 ENCOUNTER — Other Ambulatory Visit: Payer: Self-pay | Admitting: Advanced Practice Midwife

## 2022-05-22 ENCOUNTER — Other Ambulatory Visit: Payer: Medicaid Other

## 2022-05-23 ENCOUNTER — Other Ambulatory Visit (INDEPENDENT_AMBULATORY_CARE_PROVIDER_SITE_OTHER): Payer: Medicaid Other

## 2022-05-23 ENCOUNTER — Ambulatory Visit (INDEPENDENT_AMBULATORY_CARE_PROVIDER_SITE_OTHER): Payer: Medicaid Other | Admitting: General Practice

## 2022-05-23 ENCOUNTER — Other Ambulatory Visit: Payer: Self-pay

## 2022-05-23 ENCOUNTER — Encounter: Payer: Self-pay | Admitting: Family Medicine

## 2022-05-23 VITALS — BP 135/78 | HR 100

## 2022-05-23 DIAGNOSIS — O24313 Unspecified pre-existing diabetes mellitus in pregnancy, third trimester: Secondary | ICD-10-CM

## 2022-05-23 DIAGNOSIS — Z3A38 38 weeks gestation of pregnancy: Secondary | ICD-10-CM | POA: Diagnosis not present

## 2022-05-23 DIAGNOSIS — O24319 Unspecified pre-existing diabetes mellitus in pregnancy, unspecified trimester: Secondary | ICD-10-CM

## 2022-05-23 NOTE — Progress Notes (Signed)
Pt informed that the ultrasound is considered a limited OB ultrasound and is not intended to be a complete ultrasound exam.  Patient also informed that the ultrasound is not being completed with the intent of assessing for fetal or placental anomalies or any pelvic abnormalities.  Explained that the purpose of today's ultrasound is to assess for  BPP, presentation, and AFI.  Patient acknowledges the purpose of the exam and the limitations of the study.     Bianca Raneri H RN BSN 05/23/22  

## 2022-05-24 ENCOUNTER — Inpatient Hospital Stay (HOSPITAL_COMMUNITY)
Admission: AD | Admit: 2022-05-24 | Discharge: 2022-05-24 | Disposition: A | Discharge status: Home / Self Care | Payer: Medicaid Other | Attending: Obstetrics and Gynecology | Admitting: Obstetrics and Gynecology

## 2022-05-24 ENCOUNTER — Encounter (HOSPITAL_COMMUNITY): Payer: Self-pay | Admitting: Obstetrics and Gynecology

## 2022-05-24 ENCOUNTER — Ambulatory Visit (INDEPENDENT_AMBULATORY_CARE_PROVIDER_SITE_OTHER): Payer: Medicaid Other

## 2022-05-24 VITALS — BP 123/83 | HR 102

## 2022-05-24 DIAGNOSIS — O0943 Supervision of pregnancy with grand multiparity, third trimester: Secondary | ICD-10-CM | POA: Diagnosis not present

## 2022-05-24 DIAGNOSIS — O0993 Supervision of high risk pregnancy, unspecified, third trimester: Secondary | ICD-10-CM

## 2022-05-24 DIAGNOSIS — Z3A38 38 weeks gestation of pregnancy: Secondary | ICD-10-CM

## 2022-05-24 DIAGNOSIS — O09523 Supervision of elderly multigravida, third trimester: Secondary | ICD-10-CM | POA: Insufficient documentation

## 2022-05-24 DIAGNOSIS — O479 False labor, unspecified: Secondary | ICD-10-CM

## 2022-05-24 DIAGNOSIS — O471 False labor at or after 37 completed weeks of gestation: Secondary | ICD-10-CM | POA: Insufficient documentation

## 2022-05-24 DIAGNOSIS — O0990 Supervision of high risk pregnancy, unspecified, unspecified trimester: Secondary | ICD-10-CM

## 2022-05-24 NOTE — MAU Note (Signed)
I have communicated with Barb Merino, MD and reviewed vital signs:  Vitals:   05/24/22 1002 05/24/22 1003  BP:  132/80  Pulse:  (!) 101  Resp: 20   Temp: 98 F (36.7 C)     Vaginal exam:  Dilation: 1.5 Effacement (%): Thick Exam by:: Georgina Snell, RN,   Also reviewed contraction pattern and that non-stress test is reactive.  It has been documented that patient is contracting every 4-10 minutes. Patient denies any other complaints.  Based on this report provider has given order for discharge.  A discharge order and diagnosis entered by a provider.   Labor discharge instructions reviewed with patient.   Patient requesting discharge instead of cervical recheck. Patient verbalizes understanding on when to return to the hospital.

## 2022-05-24 NOTE — Progress Notes (Signed)
Subjective:  Elizabeth Warren is a 42 y.o. female here for BP check.   Hypertension ROS: no TIA's, no chest pain on exertion, no dyspnea on exertion, and no swelling of ankles.    Objective:  BP 123/83   Pulse (!) 102   LMP 08/29/2021 (Exact Date)   Appearance alert, well appearing, and in no distress. General exam BP noted to be well controlled today in office.    Assessment:   Blood Pressure well controlled.   Plan:  Report to MAU per Corlis Hove, CNM. Pt c/o of increased frequency in contractions.

## 2022-05-24 NOTE — MAU Note (Addendum)
.  Elizabeth Warren is a 42 y.o. at [redacted]w[redacted]d here in MAU reporting: was at [facility] office and sent to MAU for Labor eval since she was reporting ctx, no cervical check in office.   Contractions every: 10-15 minutes Onset of ctx: On-going Pain score: Pain Assessment Pain Assessment: 0-10 Pain Score: 8  Pain Location: Abdomen Pain Descriptors / Indicators: Contraction Pain Frequency: Intermittent Pain Onset: On-going Multiple Pain Sites: No  ROM: Membranes Sac Identifier: Sac 1 Membrane Status: Intact Color: Yellow, Clear Odor: Normal Amount: Small  Vaginal Bleeding: Vaginal Bleeding Vag. Bleeding: None  Last SVE: last week 2 cm  Epidural: Planning  Fetal Movement: Reports positive FM  FHT: Fetal Heart Rate Mode: External Baseline Rate (A): 161 bpm  Vitals:   05/24/22 1002 05/24/22 1003  BP:  132/80  Pulse:  (!) 101  Resp: 20   Temp: 98 F (36.7 C)        OB Office: Faculty GBS: Negative HSV: Denies hx of HSV Lab orders placed from triage: MAU Labor Eval

## 2022-05-24 NOTE — Progress Notes (Addendum)
Labor Check  S: Ms. Elizabeth Warren is a 42 y.o. G6P5005 at [redacted]w[redacted]d  who presents to MAU today complaining contractions q 10-15 minutes. She denies vaginal bleeding. She denies LOF. She reports normal fetal movement.    O: BP 132/80 (BP Location: Right Arm)   Pulse (!) 101   Temp 98 F (36.7 C) (Oral)   Resp 20   Ht 5' (1.524 m)   Wt 89.2 kg   LMP 08/29/2021 (Exact Date)   BMI 38.40 kg/m    Cervical exam:  Dilation: 1.5 Effacement (%): Thick Exam by:: Georgina Snell, RN   Fetal Monitoring: Baseline: 145 Variability: moderate Accelerations: 15x15 Decelerations: absent Contractions: Irregular   A: SIUP at [redacted]w[redacted]d  False labor  P: Discharge with return precautions IOL scheduled at 39w  Vonna Drafts, MD 05/24/2022 10:12 AM  GME ATTESTATION:  I agree with the findings and the plan of care as documented in the resident's note.   Lavonda Jumbo, DO OB Fellow, Faculty [facility], [facility] 05/24/2022, 10:33 AM

## 2022-05-27 ENCOUNTER — Other Ambulatory Visit: Payer: Medicaid Other

## 2022-05-27 LAB — COMPREHENSIVE METABOLIC PANEL
ALT: 17 IU/L (ref 0–32)
AST: 18 IU/L (ref 0–40)
Albumin/Globulin Ratio: 1.3 (ref 1.2–2.2)
Albumin: 3.7 g/dL — ABNORMAL LOW (ref 3.9–4.9)
Alkaline Phosphatase: 106 IU/L (ref 44–121)
BUN/Creatinine Ratio: 19 (ref 9–23)
BUN: 7 mg/dL (ref 6–20)
Bilirubin Total: <0.2 mg/dL (ref 0.0–1.2)
CO2: 15 mmol/L — ABNORMAL LOW (ref 20–29)
Calcium: 9.4 mg/dL (ref 8.7–10.2)
Chloride: 102 mmol/L (ref 96–106)
Creatinine, Ser: 0.37 mg/dL — ABNORMAL LOW (ref 0.57–1.00)
Globulin, Total: 2.8 g/dL (ref 1.5–4.5)
Glucose: 80 mg/dL (ref 70–99)
Potassium: 3.9 mmol/L (ref 3.5–5.2)
Sodium: 135 mmol/L (ref 134–144)
Total Protein: 6.5 g/dL (ref 6.0–8.5)
eGFR: 131 mL/min/{1.73_m2} (ref >59)

## 2022-05-27 LAB — CBC
Hematocrit: 38 % (ref 34.0–46.6)
Hemoglobin: 12.6 g/dL (ref 11.1–15.9)
MCH: 27.8 pg (ref 26.6–33.0)
MCHC: 33.2 g/dL (ref 31.5–35.7)
MCV: 84 fL (ref 79–97)
Platelets: 320 10*3/uL (ref 150–450)
RBC: 4.53 x10E6/uL (ref 3.77–5.28)
RDW: 13.9 % (ref 11.7–15.4)
WBC: 8.4 10*3/uL (ref 3.4–10.8)

## 2022-05-27 LAB — PROTEIN / CREATININE RATIO, URINE
Creatinine, Urine: 95.7 mg/dL
Protein, Ur: 16.1 mg/dL
Protein/Creat Ratio: 168 mg/g creat (ref 0–200)

## 2022-05-29 ENCOUNTER — Ambulatory Visit (INDEPENDENT_AMBULATORY_CARE_PROVIDER_SITE_OTHER): Payer: Medicaid Other | Admitting: Family Medicine

## 2022-05-29 ENCOUNTER — Encounter: Payer: Medicaid Other | Admitting: Obstetrics and Gynecology

## 2022-05-29 ENCOUNTER — Other Ambulatory Visit: Payer: Self-pay | Admitting: Advanced Practice Midwife

## 2022-05-29 ENCOUNTER — Encounter: Payer: Self-pay | Admitting: Family Medicine

## 2022-05-29 VITALS — BP 120/83 | HR 87 | Wt 193.0 lb

## 2022-05-29 DIAGNOSIS — O09523 Supervision of elderly multigravida, third trimester: Secondary | ICD-10-CM

## 2022-05-29 DIAGNOSIS — Z3A39 39 weeks gestation of pregnancy: Secondary | ICD-10-CM

## 2022-05-29 DIAGNOSIS — O99210 Obesity complicating pregnancy, unspecified trimester: Secondary | ICD-10-CM

## 2022-05-29 DIAGNOSIS — Z8759 Personal history of other complications of pregnancy, childbirth and the puerperium: Secondary | ICD-10-CM

## 2022-05-29 DIAGNOSIS — O0990 Supervision of high risk pregnancy, unspecified, unspecified trimester: Secondary | ICD-10-CM

## 2022-05-29 DIAGNOSIS — O24319 Unspecified pre-existing diabetes mellitus in pregnancy, unspecified trimester: Secondary | ICD-10-CM

## 2022-05-30 ENCOUNTER — Inpatient Hospital Stay (HOSPITAL_COMMUNITY): Payer: Medicaid Other | Admitting: Anesthesiology

## 2022-05-30 ENCOUNTER — Inpatient Hospital Stay (HOSPITAL_COMMUNITY)
Admission: RE | Admit: 2022-05-30 | Discharge: 2022-06-02 | DRG: 807 | Disposition: A | Discharge status: Home / Self Care | Payer: Medicaid Other | Attending: Family Medicine | Admitting: Family Medicine

## 2022-05-30 ENCOUNTER — Encounter (HOSPITAL_COMMUNITY): Payer: Self-pay | Admitting: Family Medicine

## 2022-05-30 ENCOUNTER — Inpatient Hospital Stay (HOSPITAL_COMMUNITY): Payer: Medicaid Other

## 2022-05-30 DIAGNOSIS — Z3A39 39 weeks gestation of pregnancy: Secondary | ICD-10-CM | POA: Diagnosis not present

## 2022-05-30 DIAGNOSIS — Z794 Long term (current) use of insulin: Secondary | ICD-10-CM

## 2022-05-30 DIAGNOSIS — E119 Type 2 diabetes mellitus without complications: Secondary | ICD-10-CM | POA: Diagnosis not present

## 2022-05-30 DIAGNOSIS — O0990 Supervision of high risk pregnancy, unspecified, unspecified trimester: Secondary | ICD-10-CM

## 2022-05-30 DIAGNOSIS — O24113 Pre-existing diabetes mellitus, type 2, in pregnancy, third trimester: Principal | ICD-10-CM | POA: Diagnosis present

## 2022-05-30 DIAGNOSIS — O09523 Supervision of elderly multigravida, third trimester: Secondary | ICD-10-CM | POA: Diagnosis not present

## 2022-05-30 DIAGNOSIS — O24319 Unspecified pre-existing diabetes mellitus in pregnancy, unspecified trimester: Secondary | ICD-10-CM | POA: Diagnosis present

## 2022-05-30 DIAGNOSIS — O2412 Pre-existing diabetes mellitus, type 2, in childbirth: Principal | ICD-10-CM | POA: Diagnosis present

## 2022-05-30 DIAGNOSIS — Z9049 Acquired absence of other specified parts of digestive tract: Secondary | ICD-10-CM

## 2022-05-30 DIAGNOSIS — Z7982 Long term (current) use of aspirin: Secondary | ICD-10-CM | POA: Diagnosis not present

## 2022-05-30 DIAGNOSIS — O24424 Gestational diabetes mellitus in childbirth, insulin controlled: Secondary | ICD-10-CM | POA: Diagnosis not present

## 2022-05-30 LAB — GLUCOSE, CAPILLARY
Glucose-Capillary: 83 mg/dL (ref 70–99)
Glucose-Capillary: 93 mg/dL (ref 70–99)
Glucose-Capillary: 120 mg/dL — ABNORMAL HIGH (ref 70–99)

## 2022-05-30 LAB — TYPE AND SCREEN
ABO/RH(D): A POS
Antibody Screen: NEGATIVE

## 2022-05-30 LAB — CBC
HCT: 36.9 % (ref 36.0–46.0)
Hemoglobin: 12.6 g/dL (ref 12.0–15.0)
MCH: 28.4 pg (ref 26.0–34.0)
MCHC: 34.1 g/dL (ref 30.0–36.0)
MCV: 83.3 fL (ref 80.0–100.0)
Platelets: 314 10*3/uL (ref 150–400)
RBC: 4.43 MIL/uL (ref 3.87–5.11)
RDW: 14.7 % (ref 11.5–15.5)
WBC: 8.3 10*3/uL (ref 4.0–10.5)
nRBC: 0 % (ref 0.0–0.2)

## 2022-05-30 MED ORDER — LACTATED RINGERS IV SOLN: 500.0000 mL | INTRAVENOUS | Status: DC | PRN

## 2022-05-30 MED ORDER — LACTATED RINGERS IV SOLN
500.0000 mL | Freq: Once | INTRAVENOUS | Status: AC
  Administered 2022-05-30: 500 mL via INTRAVENOUS

## 2022-05-30 MED ORDER — MISOPROSTOL 50MCG HALF TABLET
50.0000 ug | ORAL_TABLET | Freq: Once | ORAL | Status: AC
  Administered 2022-05-30: 50 ug via BUCCAL
  Filled 2022-05-30: qty 1

## 2022-05-30 MED ORDER — ZOLPIDEM TARTRATE 5 MG PO TABS
5.0000 mg | ORAL_TABLET | Freq: Once | ORAL | Status: AC
  Administered 2022-05-30: 5 mg via ORAL
  Filled 2022-05-30: qty 1

## 2022-05-30 MED ORDER — EPHEDRINE 5 MG/ML INJ: 10.0000 mg | INTRAVENOUS | Status: DC | PRN

## 2022-05-30 MED ORDER — OXYTOCIN-SODIUM CHLORIDE 30-0.9 UT/500ML-% IV SOLN
2.5000 [IU]/h | INTRAVENOUS | Status: DC
  Administered 2022-05-31: 2.5 [IU]/h via INTRAVENOUS
  Filled 2022-05-30: qty 500

## 2022-05-30 MED ORDER — ONDANSETRON HCL 4 MG/2ML IJ SOLN: 4.0000 mg | Freq: Four times a day (QID) | INTRAMUSCULAR | Status: DC | PRN

## 2022-05-30 MED ORDER — OXYCODONE-ACETAMINOPHEN 5-325 MG PO TABS
2.0000 | ORAL_TABLET | ORAL | Status: DC | PRN
  Administered 2022-05-31: 2 via ORAL
  Filled 2022-05-30: qty 2

## 2022-05-30 MED ORDER — FENTANYL-BUPIVACAINE-NACL 0.5-0.125-0.9 MG/250ML-% EP SOLN
EPIDURAL | Status: AC
  Administered 2022-05-31: 12 mL/h via EPIDURAL
  Filled 2022-05-30: qty 250

## 2022-05-30 MED ORDER — TERBUTALINE SULFATE 1 MG/ML IJ SOLN: 0.2500 mg | Freq: Once | INTRAMUSCULAR | Status: DC | PRN

## 2022-05-30 MED ORDER — MISOPROSTOL 25 MCG QUARTER TABLET
25.0000 ug | ORAL_TABLET | Freq: Once | ORAL | Status: AC
  Administered 2022-05-30: 25 ug via VAGINAL
  Filled 2022-05-30: qty 1

## 2022-05-30 MED ORDER — PHENYLEPHRINE 80 MCG/ML (10ML) SYRINGE FOR IV PUSH (FOR BLOOD PRESSURE SUPPORT): 80.0000 ug | PREFILLED_SYRINGE | INTRAVENOUS | Status: DC | PRN

## 2022-05-30 MED ORDER — LACTATED RINGERS IV SOLN: INTRAVENOUS | Status: DC

## 2022-05-30 MED ORDER — FENTANYL CITRATE (PF) 100 MCG/2ML IJ SOLN: 50.0000 ug | INTRAMUSCULAR | Status: DC | PRN

## 2022-05-30 MED ORDER — LIDOCAINE HCL (PF) 1 % IJ SOLN
INTRAMUSCULAR | Status: DC | PRN
  Administered 2022-05-30: 3 mL via EPIDURAL
  Administered 2022-05-30: 5 mL via EPIDURAL

## 2022-05-30 MED ORDER — FENTANYL-BUPIVACAINE-NACL 0.5-0.125-0.9 MG/250ML-% EP SOLN
12.0000 mL/h | EPIDURAL | Status: DC | PRN
  Administered 2022-05-30: 12 mL/h via EPIDURAL
  Filled 2022-05-30: qty 250

## 2022-05-30 MED ORDER — ACETAMINOPHEN 325 MG PO TABS: 650.0000 mg | ORAL_TABLET | ORAL | Status: DC | PRN

## 2022-05-30 MED ORDER — OXYTOCIN BOLUS FROM INFUSION
333.0000 mL | Freq: Once | INTRAVENOUS | Status: AC
  Administered 2022-05-31: 333 mL via INTRAVENOUS

## 2022-05-30 MED ORDER — OXYCODONE-ACETAMINOPHEN 5-325 MG PO TABS: 1.0000 | ORAL_TABLET | ORAL | Status: DC | PRN

## 2022-05-30 MED ORDER — LIDOCAINE HCL (PF) 1 % IJ SOLN: 30.0000 mL | INTRAMUSCULAR | Status: DC | PRN

## 2022-05-30 MED ORDER — DIPHENHYDRAMINE HCL 50 MG/ML IJ SOLN
12.5000 mg | INTRAMUSCULAR | Status: DC | PRN
  Administered 2022-05-30 – 2022-05-31 (×2): 12.5 mg via INTRAVENOUS
  Filled 2022-05-30 (×2): qty 1

## 2022-05-30 MED ORDER — SOD CITRATE-CITRIC ACID 500-334 MG/5ML PO SOLN: 30.0000 mL | ORAL | Status: DC | PRN

## 2022-05-30 MED ORDER — MISOPROSTOL 50MCG HALF TABLET
50.0000 ug | ORAL_TABLET | ORAL | Status: DC | PRN
  Administered 2022-05-30 (×2): 50 ug via ORAL
  Filled 2022-05-30 (×3): qty 1

## 2022-05-30 NOTE — Anesthesia Preprocedure Evaluation (Signed)
Anesthesia Evaluation  Patient identified by MRN, date of birth, ID band Patient awake    Reviewed: Allergy & Precautions, NPO status , Patient's Chart, lab work & pertinent test results  History of Anesthesia Complications Negative for: history of anesthetic complications  Airway Mallampati: III  TM Distance: >3 FB Neck ROM: Full    Dental   Pulmonary neg pulmonary ROS   Pulmonary exam normal breath sounds clear to auscultation       Cardiovascular negative cardio ROS  Rhythm:Regular Rate:Normal     Neuro/Psych negative neurological ROS     GI/Hepatic Neg liver ROS,GERD  Medicated,,  Endo/Other  diabetes, Type 2, Insulin Dependent    Renal/GU negative Renal ROS     Musculoskeletal   Abdominal   Peds  Hematology   Anesthesia Other Findings   Reproductive/Obstetrics (+) Pregnancy                              Anesthesia Physical Anesthesia Plan  ASA: 2  Anesthesia Plan: Epidural   Post-op Pain Management:    Induction:   PONV Risk Score and Plan:   Airway Management Planned:   Additional Equipment:   Intra-op Plan:   Post-operative Plan:   Informed Consent: I have reviewed the patients History and Physical, chart, labs and discussed the procedure including the risks, benefits and alternatives for the proposed anesthesia with the patient or authorized representative who has indicated his/her understanding and acceptance.       Plan Discussed with: Anesthesiologist  Anesthesia Plan Comments: (I have discussed risks of neuraxial anesthesia including but not limited to infection, bleeding, nerve injury, back pain, headache, seizures, and failure of block. Patient denies bleeding disorders and is not currently anticoagulated. Labs have been reviewed. Risks and benefits discussed. All patient's questions answered.  )         Anesthesia Quick Evaluation

## 2022-05-30 NOTE — Progress Notes (Addendum)
Patient Vitals for the past 4 hrs:  BP Temp Temp src Pulse Resp  05/30/22 2100 121/74 -- -- 85 --  05/30/22 2033 (!) 130/90 -- -- 90 --  05/30/22 2000 108/71 -- -- 91 --  05/30/22 1930 107/67 98.7 F (37.1 C) Oral 86 18  05/30/22 1900 114/61 -- -- 84 16  05/30/22 1830 119/73 -- -- (!) 114 16  05/30/22 1825 122/89 -- -- 97 16  05/30/22 1820 120/78 -- -- 100 16  05/30/22 1815 (!) 149/86 -- -- (!) 102 16  05/30/22 1810 (!) 127/103 -- -- 97 16  05/30/22 1805 131/74 -- -- 99 16  05/30/22 1800 126/76 -- -- (!) 101 16  05/30/22 1752 (!) 135/95 -- -- 99 16  05/30/22 1748 128/88 -- -- (!) 104 16   Comfortable w/epidural.  Cx 1/long/ballottable.  FHR Cat 1.  Irregular ctx.  Cooks inserted and inflated w/60ccU/40ccV.  Continue cytotec until balloon falls out. Last BS 93

## 2022-05-30 NOTE — H&P (Addendum)
OBSTETRIC ADMISSION HISTORY AND PHYSICAL  Elizabeth Warren is a 42 y.o. female [phone] with IUP at [redacted]w[redacted]d by LMP presenting for T2DM. She reports +FMs, No LOF, no VB, no blurry vision, headaches or peripheral edema, and RUQ pain.  She plans on breast and formula feeding. She request pp Mirena for birth control. She received her prenatal care at [facility]  Dating: By LMP --->  Estimated Date of Delivery: 06/05/22 Sono: , CWD, normal anatomy, cephalic presentation, posterior placenta, 2150g, 57% EFW  Prenatal History/Complications: Mild UTD that resolved  Nursing Staff Provider  Office Location [facility] Dating  06/05/2022, by Last Menstrual Period  [facility] Model Arly.Keller ] Traditional  Centering  Mom-Baby Dyad Anatomy US  normal  Language  English    Flu Vaccine  Declined Genetic/Carrier Screen  NIPS:   low risk female AFP:   declined Horizon: neg x 4  TDaP Vaccine  04/08/2022 Hgb A1C or  GTT Early: 6.7 Third trimester   COVID Vaccine Not vaccinated   LAB RESULTS   Rhogam  A/Positive/-- (11/07 4540)  Blood Type A/Positive/-- (11/07 0835)   Baby Feeding Plan Breast and formula Antibody Negative (11/07 0835)  Contraception Mirena Rubella 24.70 (11/07 0835)  Circumcision Undecided RPR Non Reactive (02/05 1017)   Pediatrician   [facility], Dr Reita Cliche HBsAg Negative (11/07 0835)   Support Person Husband HCVAb Non Reactive (11/07 0835)   Prenatal Classes  HIV Non Reactive (02/05 1017)     BTL Consent  GBS   (For PCN allergy, check sensitivities)   VBAC Consent  Pap Diagnosis  Date Value Ref Range Status  12/07/2021   Final   - Negative for intraepithelial lesion or malignancy (NILM)         DME Rx Arly.Keller ] BP cuff Arly.Keller ] Weight Scale Waterbirth   Class  Consent  CNM visit  PHQ9 & GAD7 Arly.Keller  ] new OB [ x ] 28 weeks  [  ] 36 weeks Induction   Orders Entered Foley Y/N   Past Medical History: Past Medical History:  Diagnosis Date   Acute cholecystitis 01/18/2020   Gestational  diabetes    History of gestational diabetes 09/06/2016   Hypertension    NVD (normal vaginal delivery) 09/20/2010   Pre-existing type 2 diabetes mellitus during pregnancy in second trimester 10/02/2016   S/P cholecystectomy 01/17/2021   Past Surgical History: Past Surgical History:  Procedure Laterality Date   CHOLECYSTECTOMY N/A 01/19/2020   Procedure: LAPAROSCOPIC CHOLECYSTECTOMY;  Surgeon: Almond Lint, MD;  Location: [facility];  Service: General;  Laterality: N/A;   Obstetrical History: OB History     Gravida  6   Para  5   Term  5   Preterm  0   AB  0   Living  5      SAB  0   IAB  0   Ectopic  0   Multiple  0   Live Births  5          Social History Social History   Socioeconomic History   Marital status: Married    Spouse name: Not on file   Number of children: Not on file   Years of education: Not on file   Highest education level: Not on file  Occupational History   Not on file  Tobacco Use   Smoking status: Never   Smokeless tobacco: Never  Vaping Use   Vaping Use: Never used  Substance  and Sexual Activity   Alcohol use: No   Drug use: No   Sexual activity: Not Currently    Partners: Male    Birth control/protection: None  Other Topics Concern   Not on file  Social History Narrative   Not on file   Social Determinants of Health   Financial Resource Strain: Not on file  Food Insecurity: No Food Insecurity (05/30/2022)   Hunger Vital Sign    Worried About Running Out of Food in the Last Year: Never true    Ran Out of Food in the Last Year: Never true  Transportation Needs: No Transportation Needs (05/30/2022)   PRAPARE - Administrator, Civil Service (Medical): No    Lack of Transportation (Non-Medical): No  Physical Activity: Not on file  Stress: Not on file  Social Connections: Not on file   Family History: Family History  Problem Relation Age of Onset   Diabetes Mother    Hypertension Mother    Diabetes  Father    Hypertension Father    Diabetes Maternal Grandmother    Hypertension Maternal Grandmother    Diabetes Maternal Grandfather    Hypertension Maternal Grandfather    Diabetes Paternal Grandmother    Hypertension Paternal Grandmother    Diabetes Paternal Grandfather    Hypertension Paternal Grandfather    Cancer Neg Hx    Heart disease Neg Hx    Stroke Neg Hx    Allergies: No Known Allergies  Medications Prior to Admission  Medication Sig Dispense Refill Last Dose   loperamide (IMODIUM) 2 MG capsule Take 1 capsule (2 mg total) by mouth as needed for diarrhea or loose stools. 30 capsule 3 05/30/2022   pantoprazole (PROTONIX) 40 MG tablet Take 1 tablet (40 mg total) by mouth daily. 30 tablet 1 05/30/2022   Prenatal Vit-Fe Fumarate-FA (WESTAB PLUS) 27-1 MG TABS Take 1 tablet by mouth daily. 90 tablet 2 05/30/2022   Accu-Chek Softclix Lancets lancets Use as instructed 100 each 12    acetaminophen (TYLENOL) 325 MG tablet Take 2 tablets (650 mg total) by mouth every 6 (six) hours as needed. 30 tablet 0    aspirin EC 81 MG tablet Take 1 tablet (81 mg total) by mouth daily. Take after 12 weeks for prevention of preeclampsia later in pregnancy 300 tablet 2    blood glucose meter kit and supplies KIT Dispense based on patient and insurance preference. Use up to four times daily as directed. (Patient not taking: Reported on 05/24/2022) 1 each 0    Blood Glucose Monitoring Suppl (ACCU-CHEK GUIDE ME) w/Device KIT 1 Units by Does not apply route every other day. (Patient not taking: Reported on 05/24/2022) 1 kit 0    Blood Pressure Monitoring (BLOOD PRESSURE KIT) DEVI 1 kit by Does not apply route once a week. (Patient not taking: Reported on 05/24/2022) 1 each 0    DICLEGIS 10-10 MG TBEC Take 2 tablets by mouth at bedtime. If symptoms persist, add one tablet in the morning and one in the afternoon (Patient not taking: Reported on 05/24/2022) 100 tablet 5    dicyclomine (BENTYL) 20 MG tablet Take 1  tablet (20 mg total) by mouth 4 (four) times daily -  before meals and at bedtime for 14 days. 56 tablet 1    ferrous sulfate 325 (65 FE) MG tablet Take 1 tablet (325 mg total) by mouth every other day. 90 tablet 1    glucose blood (ACCU-CHEK GUIDE) test strip USE AS DIRECTED (  Patient not taking: Reported on 05/24/2022) 100 strip 3    INS SYRINGE/NEEDLE .5CC/27G 27G X 1/2" 0.5 ML MISC 1 Syringe by Does not apply route daily. (Patient not taking: Reported on 05/24/2022) 100 each 2    Lancets Misc. (ACCU-CHEK SOFTCLIX LANCET DEV) KIT 1 kit by Does not apply route daily. (Patient not taking: Reported on 05/24/2022) 1 kit 0    LEVEMIR 100 UNIT/ML injection Inject 0.36 mLs (36 Units total) into the skin daily. 10 mL 6    Misc. Devices (GOJJI WEIGHT SCALE) MISC 1 Device by Does not apply route every 30 (thirty) days. (Patient not taking: Reported on 05/24/2022) 1 each 0    UNKNOWN TO PATIENT OTC allergy med (Patient not taking: Reported on 05/24/2022)      Review of Systems  All systems reviewed and negative except as stated in HPI  Blood pressure 113/79, pulse 100, temperature 97.8 F (36.6 C), temperature source Oral, resp. rate 16, height 5' (1.524 m), weight 193 lb 2 oz (87.6 kg), last menstrual period 08/29/2021. General appearance: alert, cooperative, appears stated age, and no distress Lungs: normal effort, no problems with respiration noted Heart: regular rate and rhythm, warm and well perfused Abdomen: soft, non-tender Pelvic: normal external female genitalia, no bleeding, cytotec placed Extremities: Homans sign is negative, no sign of DVT DTR's normal Presentation: cephalic Fetal monitoring - Baseline: 145 bpm, Variability: Good {> 6 bpm), Accelerations: Reactive, and Decelerations: Absent Uterine activity: UI Dilation: 1 Effacement (%): Thick Exam by:: Tyler Aas CNM  Prenatal labs: ABO, Rh: --/--/A POS (04/25 1411) Antibody: NEG (04/25 1411) Rubella: 24.70 (11/07 0835) RPR: Non  Reactive (02/05 1017)  HBsAg: Negative (11/07 0835)  HIV: Non Reactive (02/05 1017)  GBS: Negative/-- (04/02 1006)  No GTT, known T2DM Genetic screening normal Anatomy US normal, mild UTD resolved  Prenatal Transfer Tool  Maternal Diabetes: Yes:  Diabetes Type:  Insulin/Medication controlled Genetic Screening: Normal Maternal Ultrasounds/Referrals: Other: mild UTD Fetal Ultrasounds or other Referrals:  Referred to Materal Fetal Medicine , never got fetal echo Maternal Substance Abuse:  No Significant Maternal Medications:  None Significant Maternal Lab Results:  Group B Strep negative Number of Prenatal Visits:greater than 3 verified prenatal visits Other Comments:  None  Results for orders placed or performed during the hospital encounter of 05/30/22 (from the past 24 hour(s))  Type and screen   Collection Time: 05/30/22  2:11 PM  Result Value Ref Range   ABO/RH(D) A POS    Antibody Screen NEG    Sample Expiration      06/02/2022,2359 Performed at [facility], [address], [place], [place] [address]    Patient Active Problem List   Diagnosis Date Noted   Type 2 diabetes mellitus affecting pregnancy in third trimester, antepartum 05/30/2022   Pyelectasis of fetus on prenatal ultrasound, RESOLVED 03/26/2022   Maternal obesity affecting pregnancy, antepartum 12/07/2021   Pre-existing diabetes mellitus affecting pregnancy, antepartum 12/07/2021   AMA (advanced maternal age) multigravida 35+ 12/07/2021   History of gestational hypertension 12/07/2021   Supervision of high risk pregnancy, antepartum 10/29/2021   Irritable bowel syndrome with diarrhea 07/11/2021   Clostridioides difficile infection 07/11/2021   S/P cholecystectomy 01/17/2021   Chronic diarrhea 02/25/2020   Low TSH level 09/03/2019   Type 2 diabetes mellitus with hyperglycemia 09/01/2019   Obesity (BMI 30-39.9) 10/31/2011   Assessment/Plan:  Elizabeth Warren is a 42 y.o. G6P5005 at [redacted]w[redacted]d here for IOL  for T2DM on insulin (36u levemir  daily)  #Labor: IOL started with loading dose of cytotec ( oral, vaginal) #Pain: Planning epidural #FWB: Cat 1 #ID: GBS neg #MOF: Breast and formula #MOC: OP IUD (Mirena) #Circ: Undecided #DM: q4hr CBG, continue basal dose  #HTN: elevated BP 4/15 (recheck and labs normal), will monitor closely in labor, no s/sx PEC  Bernerd Limbo, CNM  05/30/2022, 4:56 PM

## 2022-05-30 NOTE — Anesthesia Procedure Notes (Signed)
Epidural Patient location during procedure: OB Start time: 05/30/2022 5:40 PM End time: 05/30/2022 5:48 PM  Staffing Anesthesiologist: Linton Rump, MD Performed: anesthesiologist   Preanesthetic Checklist Completed: patient identified, IV checked, site marked, risks and benefits discussed, surgical consent, monitors and equipment checked, pre-op evaluation and timeout performed  Epidural Patient position: sitting Prep: DuraPrep and site prepped and draped Patient monitoring: continuous pulse ox and blood pressure Approach: midline Location: L3-L4 Injection technique: LOR saline  Needle:  Needle type: Tuohy  Needle gauge: 17 G Needle length: 9 cm and 9 Needle insertion depth: 5 cm Catheter type: closed end flexible Catheter size: 19 Gauge Catheter at skin depth: 9 cm Test dose: negative  Assessment Events: blood not aspirated, no cerebrospinal fluid, injection not painful, no injection resistance, no paresthesia and negative IV test  Additional Notes The patient has requested an epidural for labor pain management. Risks and benefits including, but not limited to, infection, bleeding, local anesthetic toxicity, headache, hypotension, back pain, block failure, etc. were discussed with the patient. The patient expressed understanding and consented to the procedure. I confirmed that the patient has no bleeding disorders and is not taking blood thinners. I confirmed the patient's last platelet count with the nurse. A time-out was performed immediately prior to the procedure. Please see nursing documentation for vital signs. Sterile technique was used throughout the whole procedure. Once LOR achieved, the epidural catheter threaded easily without resistance. Aspiration of the catheter was negative for blood and CSF. The epidural was dosed slowly and an infusion was started.  2 attempt(s)Reason for block:procedure for pain

## 2022-05-30 NOTE — Progress Notes (Signed)
   PRENATAL VISIT NOTE  Subjective:  Elizabeth Warren is a 42 y.o. G6P5005 at [redacted]w[redacted]d being seen today for ongoing prenatal care.  She is currently monitored for the following issues for this high-risk pregnancy and has Obesity (BMI 30-39.9); Type 2 diabetes mellitus with hyperglycemia; Low TSH level; Chronic diarrhea; S/P cholecystectomy; Irritable bowel syndrome with diarrhea; Clostridioides difficile infection; Supervision of high risk pregnancy, antepartum; Maternal obesity affecting pregnancy, antepartum; Pre-existing diabetes mellitus affecting pregnancy, antepartum; AMA (advanced maternal age) multigravida 35+; History of gestational hypertension; Pyelectasis of fetus on prenatal ultrasound, RESOLVED; and Type 2 diabetes mellitus affecting pregnancy in third trimester, antepartum on their problem list.  Patient reports no bleeding, no cramping, no leaking, and occasional contractions.  Contractions: Irritability. Vag. Bleeding: None.  Movement: Present. Denies leaking of fluid.   The following portions of the patient's history were reviewed and updated as appropriate: allergies, current medications, past family history, past medical history, past social history, past surgical history and problem list.   Objective:   Vitals:   05/29/22 0948  BP: 120/83  Pulse: 87  Weight: 193 lb (87.5 kg)    Fetal Status: Fetal Heart Rate (bpm): 148   Movement: Present     General:  Alert, oriented and cooperative. Patient is in no acute distress.  Skin: Skin is warm and dry. No rash noted.   Cardiovascular: Normal heart rate noted  Respiratory: Normal respiratory effort, no problems with respiration noted  Abdomen: Soft, gravid, appropriate for gestational age.  Pain/Pressure: Present     Pelvic: Cervical exam deferred        Extremities: Normal range of motion.  Edema: None  Mental Status: Normal mood and affect. Normal behavior. Normal judgment and thought content.   Assessment and Plan:   Pregnancy: G6P5005 at [redacted]w[redacted]d 1. Supervision of high risk pregnancy, antepartum Continue routine prenatal care.  Induction scheduled for tomorrow.  2. Pre-existing diabetes mellitus affecting pregnancy, antepartum Blood glucoses well-controlled.  Scheduled for induction at 39 weeks (tomorrow).  3. Obesity affecting pregnancy, antepartum, unspecified obesity type  4. History of gestational hypertension Blood pressure well-controlled today.  Patient reports blood pressures at home have also been well-controlled.  Continue to monitor.    5. Multigravida of advanced maternal age in third trimester  6. [redacted] weeks gestation of pregnancy  Term labor symptoms and general obstetric precautions including but not limited to vaginal bleeding, contractions, leaking of fluid and fetal movement were reviewed in detail with the patient. Please refer to After Visit Summary for other counseling recommendations.   No follow-ups on file.  No future appointments.  Celedonio Savage, MD

## 2022-05-31 ENCOUNTER — Encounter (HOSPITAL_COMMUNITY): Payer: Self-pay | Admitting: Family Medicine

## 2022-05-31 DIAGNOSIS — O09523 Supervision of elderly multigravida, third trimester: Secondary | ICD-10-CM | POA: Diagnosis not present

## 2022-05-31 DIAGNOSIS — O24424 Gestational diabetes mellitus in childbirth, insulin controlled: Secondary | ICD-10-CM | POA: Diagnosis not present

## 2022-05-31 DIAGNOSIS — Z3A39 39 weeks gestation of pregnancy: Secondary | ICD-10-CM | POA: Diagnosis not present

## 2022-05-31 LAB — GLUCOSE, CAPILLARY
Glucose-Capillary: 121 mg/dL — ABNORMAL HIGH (ref 70–99)
Glucose-Capillary: 125 mg/dL — ABNORMAL HIGH (ref 70–99)
Glucose-Capillary: 114 mg/dL — ABNORMAL HIGH (ref 70–99)
Glucose-Capillary: 120 mg/dL — ABNORMAL HIGH (ref 70–99)
Glucose-Capillary: 237 mg/dL — ABNORMAL HIGH (ref 70–99)

## 2022-05-31 LAB — RPR: RPR Ser Ql: NONREACTIVE

## 2022-05-31 MED ORDER — TETANUS-DIPHTH-ACELL PERTUSSIS 5-2.5-18.5 LF-MCG/0.5 IM SUSY: 0.5000 mL | PREFILLED_SYRINGE | Freq: Once | INTRAMUSCULAR | Status: DC

## 2022-05-31 MED ORDER — SENNOSIDES-DOCUSATE SODIUM 8.6-50 MG PO TABS
2.0000 | ORAL_TABLET | Freq: Every day | ORAL | Status: DC
  Filled 2022-05-31 (×2): qty 2

## 2022-05-31 MED ORDER — ZOLPIDEM TARTRATE 5 MG PO TABS
5.0000 mg | ORAL_TABLET | Freq: Every evening | ORAL | Status: DC | PRN
  Administered 2022-05-31: 5 mg via ORAL
  Filled 2022-05-31: qty 1

## 2022-05-31 MED ORDER — BENZOCAINE-MENTHOL 20-0.5 % EX AERO: 1.0000 | INHALATION_SPRAY | CUTANEOUS | Status: DC | PRN

## 2022-05-31 MED ORDER — ACETAMINOPHEN 325 MG PO TABS
650.0000 mg | ORAL_TABLET | ORAL | Status: DC | PRN
  Filled 2022-05-31: qty 2

## 2022-05-31 MED ORDER — PRENATAL MULTIVITAMIN CH
1.0000 | ORAL_TABLET | Freq: Every day | ORAL | Status: DC
  Administered 2022-06-01 – 2022-06-02 (×2): 1 via ORAL
  Filled 2022-05-31 (×2): qty 1

## 2022-05-31 MED ORDER — ONDANSETRON HCL 4 MG/2ML IJ SOLN: 4.0000 mg | INTRAMUSCULAR | Status: DC | PRN

## 2022-05-31 MED ORDER — SIMETHICONE 80 MG PO CHEW: 80.0000 mg | CHEWABLE_TABLET | ORAL | Status: DC | PRN

## 2022-05-31 MED ORDER — IBUPROFEN 600 MG PO TABS
600.0000 mg | ORAL_TABLET | Freq: Four times a day (QID) | ORAL | Status: DC
  Administered 2022-06-01 – 2022-06-02 (×2): 600 mg via ORAL
  Filled 2022-05-31 (×5): qty 1

## 2022-05-31 MED ORDER — DIBUCAINE (PERIANAL) 1 % EX OINT: 1.0000 | TOPICAL_OINTMENT | CUTANEOUS | Status: DC | PRN

## 2022-05-31 MED ORDER — TERBUTALINE SULFATE 1 MG/ML IJ SOLN: 0.2500 mg | Freq: Once | INTRAMUSCULAR | Status: DC | PRN

## 2022-05-31 MED ORDER — COCONUT OIL OIL: 1.0000 | TOPICAL_OIL | Status: DC | PRN

## 2022-05-31 MED ORDER — DIPHENHYDRAMINE HCL 25 MG PO CAPS: 25.0000 mg | ORAL_CAPSULE | Freq: Four times a day (QID) | ORAL | Status: DC | PRN

## 2022-05-31 MED ORDER — OXYCODONE HCL 5 MG PO TABS
10.0000 mg | ORAL_TABLET | ORAL | Status: DC | PRN
  Administered 2022-06-01 – 2022-06-02 (×8): 10 mg via ORAL
  Filled 2022-05-31 (×7): qty 2

## 2022-05-31 MED ORDER — OXYCODONE HCL 5 MG PO TABS
5.0000 mg | ORAL_TABLET | ORAL | Status: DC | PRN
  Filled 2022-05-31 (×2): qty 1

## 2022-05-31 MED ORDER — INSULIN DETEMIR 100 UNIT/ML ~~LOC~~ SOLN
10.0000 [IU] | SUBCUTANEOUS | Status: DC
  Administered 2022-05-31 – 2022-06-01 (×2): 10 [IU] via SUBCUTANEOUS
  Filled 2022-05-31 (×3): qty 0.1

## 2022-05-31 MED ORDER — ONDANSETRON HCL 4 MG PO TABS: 4.0000 mg | ORAL_TABLET | ORAL | Status: DC | PRN

## 2022-05-31 MED ORDER — WITCH HAZEL-GLYCERIN EX PADS: 1.0000 | MEDICATED_PAD | CUTANEOUS | Status: DC | PRN

## 2022-05-31 MED ORDER — OXYTOCIN-SODIUM CHLORIDE 30-0.9 UT/500ML-% IV SOLN: 1.0000 m[IU]/min | INTRAVENOUS | Status: DC

## 2022-05-31 MED ORDER — OXYTOCIN-SODIUM CHLORIDE 30-0.9 UT/500ML-% IV SOLN
1.0000 m[IU]/min | INTRAVENOUS | Status: DC
  Administered 2022-05-31: 2 m[IU]/min via INTRAVENOUS

## 2022-05-31 MED ORDER — INSULIN ASPART 100 UNIT/ML IJ SOLN
0.0000 [IU] | Freq: Three times a day (TID) | INTRAMUSCULAR | Status: DC
  Administered 2022-06-01: 3 [IU] via SUBCUTANEOUS
  Administered 2022-06-01: 2 [IU] via SUBCUTANEOUS

## 2022-05-31 NOTE — Progress Notes (Signed)
Elizabeth Warren is a 42 y.o. [id number] at [redacted]w[redacted]d admitted for induction of labor due to T2DM.  Subjective:   Objective: BP 129/84   Pulse (!) 112   Temp 97.7 F (36.5 C) (Axillary)   Resp 17   Ht 5' (1.524 m)   Wt 87.6 kg   LMP 08/29/2021 (Exact Date)   BMI 37.72 kg/m  I/O last 3 completed shifts: In: -  Out: 950 [Urine:950] No intake/output data recorded.  FHT:  {findings; monitor fetal heart monitor:21620} UC:   {obgyn contractions reg/irreg:312982} SVE:   Dilation: 7 Effacement (%): 100 Station: -1 Exam by:: [employer]: Lab Results  Component Value Date   WBC 8.3 05/30/2022   HGB 12.6 05/30/2022   HCT 36.9 05/30/2022   MCV 83.3 05/30/2022   PLT 314 05/30/2022    Assessment / Plan: {CHL LABOR ASSESSMENT:21627}  Labor: {CHL LABOR PROGRESS:21622}Pt worried that FHR elevated and had a previous child go to the NICU. She is tearful and wants to have a cesarean if this is safer.  Discussed current FHR tracing with accelerations and positive fetal scalp stimulation Preeclampsia:  {CHL PREECLAMPSIA PLAN:21623} Fetal Wellbeing:  {CHL FWB PLAN:21624} Pain Control:  {CHL FWB PLAN:21625} I/D:  {NA AND ZHYQMVHQ:46962} Anticipated MOD:  {XBM:84132}  Sharen Counter, CNM 05/31/2022, 2:16 PM

## 2022-05-31 NOTE — Progress Notes (Signed)
Patient Vitals for the past 4 hrs:  BP Temp Temp src Pulse  05/31/22 0301 106/67 -- -- 100  05/31/22 0231 116/67 -- -- (!) 102  05/31/22 0201 121/71 -- -- (!) 114  05/31/22 0130 114/74 -- -- (!) 112  05/31/22 0118 101/67 -- -- (!) 124  05/31/22 0031 (!) 103/59 -- -- (!) 111  05/31/22 0001 102/61 -- -- (!) 108  05/30/22 2335 -- 98.5 F (36.9 C) Oral --   Balloon out 2 hours ago, last cytotec now 4 hours ago. Cx 5/long/-2, cx well applied to cx.  AROM w/clear fluid. FHR Cat 1.  If not actively laboring in a few hours, will start pitocin. Blod sugar 120

## 2022-05-31 NOTE — Discharge Summary (Signed)
Postpartum Discharge Summary  Date of Service updated-yes     Patient Name: Elizabeth Warren DOB: [birth year] MRN: [id number]  Date of admission: 05/30/2022 Delivery date:05/31/2022  Delivering provider: Sharen Counter A  Date of discharge: 06/02/2022  Admitting diagnosis: Type 2 diabetes mellitus affecting pregnancy in third trimester, antepartum [O24.113] Intrauterine pregnancy: [redacted]w[redacted]d     Secondary diagnosis:  Principal Problem:   Type 2 diabetes mellitus affecting pregnancy in third trimester, antepartum Active Problems:   NSVD (normal spontaneous vaginal delivery)   Supervision of high risk pregnancy, antepartum   Pre-existing diabetes mellitus affecting pregnancy, antepartum  Additional problems: none    Discharge diagnosis: Term Pregnancy Delivered                                              Post partum procedures: none Augmentation: AROM, Pitocin, Cytotec, OP Foley, and IP Foley Complications: None  Hospital course: Induction of Labor With Vaginal Delivery   42 y.o. yo [id number] at [redacted]w[redacted]d was admitted to the hospital 05/30/2022 for induction of labor.  Indication for induction: TYPE 2 DM.  Patient had an labor course complicated by none. Membrane Rupture Time/Date: 3:21 AM ,05/31/2022   Delivery Method:Vaginal, Spontaneous  Episiotomy: None  Lacerations:  1st degree  Details of delivery can be found in separate delivery note.  Patient had a postpartum course uncomplicated. Patient is discharged home 06/02/22.  Newborn Data: Birth date:05/31/2022  Birth time:5:39 PM  Gender:Female  Living status:Living  Apgars:9 ,9  Weight:3220 g   Magnesium Sulfate received: No BMZ received: No Rhophylac:N/A MMR:N/A T-DaP:Given prenatally Flu: No Transfusion:No  Physical exam  Vitals:   06/01/22 0523 06/01/22 0922 06/01/22 1943 06/02/22 0535  BP: 97/68 120/70 131/81 112/72  Pulse: 86 96 98 96  Resp: 18 17 16 18   Temp: 98 F (36.7 C) 97.7 F (36.5 C) 98.1 F (36.7 C) 98.1  F (36.7 C)  TempSrc: Oral Oral Oral Oral  SpO2:  100%    Weight:      Height:       General: alert, cooperative, and no distress Lochia: appropriate Uterine Fundus: firm Incision: N/A DVT Evaluation: No evidence of DVT seen on physical exam. Negative Homan's sign. No cords or calf tenderness. No significant calf/ankle edema. Labs: Lab Results  Component Value Date   WBC 8.3 05/30/2022   HGB 12.6 05/30/2022   HCT 36.9 05/30/2022   MCV 83.3 05/30/2022   PLT 314 05/30/2022      Latest Ref Rng & Units 05/20/2022    3:51 PM  CMP  Glucose 70 - 99 mg/dL 80   BUN 6 - 20 mg/dL 7   Creatinine 4.40 - 3.47 mg/dL 4.25   Sodium 956 - 387 mmol/L 135   Potassium 3.5 - 5.2 mmol/L 3.9   Chloride 96 - 106 mmol/L 102   CO2 20 - 29 mmol/L 15   Calcium 8.7 - 10.2 mg/dL 9.4   Total Protein 6.0 - 8.5 g/dL 6.5   Total Bilirubin 0.0 - 1.2 mg/dL <5.6   Alkaline Phos 44 - 121 IU/L 106   AST 0 - 40 IU/L 18   ALT 0 - 32 IU/L 17    Edinburgh Score:    04/03/2017    8:19 AM  Edinburgh Postnatal Depression Scale Screening Tool  I have been able to laugh and see the funny side of things. 0  I have looked forward with enjoyment to things. 0  I have blamed myself unnecessarily when things went wrong. 0  I have been anxious or worried for no good reason. 0  I have felt scared or panicky for no good reason. 0  Things have been getting on top of me. 0  I have been so unhappy that I have had difficulty sleeping. 0  I have felt sad or miserable. 0  I have been so unhappy that I have been crying. 0  The thought of harming myself has occurred to me. 0  Edinburgh Postnatal Depression Scale Total 0     After visit meds:  Allergies as of 06/02/2022   No Known Allergies      Medication List     STOP taking these medications    aspirin EC 81 MG tablet   Diclegis 10-10 MG Tbec Generic drug: Doxylamine-Pyridoxine   ferrous sulfate 325 (65 FE) MG tablet   Gojji Weight Scale Misc   UNKNOWN  TO PATIENT       TAKE these medications    Accu-Chek Guide Me w/Device Kit 1 Units by Does not apply route every other day.   Accu-Chek Guide test strip Generic drug: glucose blood USE AS DIRECTED   Accu-Chek Softclix Lancet Dev Kit 1 kit by Does not apply route daily.   Accu-Chek Softclix Lancets lancets Use as instructed   acetaminophen 325 MG tablet Commonly known as: Tylenol Take 2 tablets (650 mg total) by mouth every 4 (four) hours as needed (for pain scale < 4). What changed:  when to take this reasons to take this   blood glucose meter kit and supplies Kit Dispense based on patient and insurance preference. Use up to four times daily as directed.   Blood Pressure Kit Devi 1 kit by Does not apply route once a week.   dicyclomine 20 MG tablet Commonly known as: BENTYL Take 1 tablet (20 mg total) by mouth 4 (four) times daily -  before meals and at bedtime for 14 days.   ibuprofen 600 MG tablet Commonly known as: ADVIL Take 1 tablet (600 mg total) by mouth every 6 (six) hours.   INS SYRINGE/NEEDLE .5CC/27G 27G X 1/2" 0.5 ML Misc 1 Syringe by Does not apply route daily.   loperamide 2 MG capsule Commonly known as: IMODIUM Take 1 capsule (2 mg total) by mouth as needed for diarrhea or loose stools.   oxyCODONE 5 MG immediate release tablet Commonly known as: Oxy IR/ROXICODONE Take 1 tablet (5 mg total) by mouth every 4 (four) hours as needed (pain scale 4-7).   pantoprazole 40 MG tablet Commonly known as: Protonix Take 1 tablet (40 mg total) by mouth daily.   WesTab Plus 27-1 MG Tabs Take 1 tablet by mouth daily.         Discharge home in stable condition Infant Feeding: Bottle and Breast Infant Disposition:home with mother Discharge instruction: per After Visit Summary and Postpartum booklet. Activity: Advance as tolerated. Pelvic rest for 6 weeks.  Diet: carb modified diet Future Appointments:No future appointments. Follow up  Visit:  Message sent to [facility] on 05/31/22:  Please schedule this patient for a In person postpartum visit in 6 weeks with the following provider:  Sharen Counter, CNM . Additional Postpartum F/U: n/a   High risk pregnancy complicated by:  T2DM Delivery mode:  Vaginal, Spontaneous  Anticipated Birth Control:  IUD   06/02/2022 Donette Larry, CNM

## 2022-06-01 LAB — GLUCOSE, CAPILLARY
Glucose-Capillary: 123 mg/dL — ABNORMAL HIGH (ref 70–99)
Glucose-Capillary: 130 mg/dL — ABNORMAL HIGH (ref 70–99)
Glucose-Capillary: 175 mg/dL — ABNORMAL HIGH (ref 70–99)
Glucose-Capillary: 176 mg/dL — ABNORMAL HIGH (ref 70–99)
Glucose-Capillary: 98 mg/dL (ref 70–99)

## 2022-06-01 MED ORDER — LOPERAMIDE HCL 2 MG PO CAPS
4.0000 mg | ORAL_CAPSULE | Freq: Once | ORAL | Status: AC
  Administered 2022-06-01: 4 mg via ORAL
  Filled 2022-06-01: qty 2

## 2022-06-01 NOTE — Progress Notes (Signed)
Post Partum Day 1 Subjective: No complaints, up ad lib, voiding, and tolerating PO.  Baby is stable. Breastfeeding. Mild lochia reported.   Objective: Blood pressure 120/70, pulse 96, temperature 97.7 F (36.5 C), temperature source Oral, resp. rate 17, height 5' (1.524 m), weight 87.6 kg, last menstrual period 08/29/2021, SpO2 100 %, unknown if currently breastfeeding.  Physical Exam:  General: alert and no distress Lochia: appropriate Uterine Fundus: firm, nontender DVT Evaluation: No evidence of DVT seen on physical exam.  Negative Homan's sign. No cords or calf tenderness. No significant calf/ankle edema.  Recent Labs    05/30/22 1412  HGB 12.6  HCT 36.9   CBG (last 3)  Recent Labs    06/01/22 0037 06/01/22 0620 06/01/22 0938  GLUCAP 98 123* 175*   Assessment/Plan: Sugars stable except for latest, continue Levemir 10 u qhs for now. Can increase if needed. Recommended OOB, diet as tolerated. Analgesia as needed. Patient desires circumcision for her female infant.  Circumcision procedure details discussed, risks and benefits of procedure were also discussed.  These include but are not limited to: Benefits of circumcision in men include reduction in the rates of urinary tract infection (UTI), penile cancer, some sexually transmitted infections, penile inflammatory and retractile disorders, as well as easier hygiene.  Risks include bleeding , infection, injury of glans which may lead to penile deformity or urinary tract issues, unsatisfactory cosmetic appearance and other potential complications related to the procedure.  It was emphasized that this is an elective procedure.  Patient wants to proceed with circumcision; written informed consent obtained.  Will do circumcision prior to infant's discharge, routine circumcision and post circumcision care ordered for the infant. Plan for discharge tomorrow, Breast and bottle feeding, and Contraception outpatient IUD   LOS: 2 days    Elizabeth Collins, MD 06/01/2022, 11:39 AM

## 2022-06-01 NOTE — Lactation Note (Signed)
This note was copied from a baby's chart. Lactation Consultation Note  Patient Name: Elizabeth Warren [id number] Date: 06/01/2022 Age:40 hours Reason for consult: Initial assessment  P6, Experienced with breastfeeding.  Mother has chosen to breastfeed and formula feed.  Older sister states baby has not liked the formula.  Encouraged breastfeeding before formula to help establish mother's milk supply. Lactation to consult if needed.   Maternal Data Does the patient have breastfeeding experience prior to this delivery?: Yes How long did the patient breastfeed?: 2 years  Feeding Mother's Current Feeding Choice: Breast Milk and Formula  Interventions Interventions: Education;LC Services brochure  Consult Status Consult Status: Complete    Hardie Pulley 06/01/2022, 9:47 AM

## 2022-06-01 NOTE — Anesthesia Postprocedure Evaluation (Signed)
Anesthesia Post Note  Patient: Elizabeth Warren  Procedure(s) Performed: AN AD HOC LABOR EPIDURAL     Patient location during evaluation: Mother Baby Anesthesia Type: Epidural Level of consciousness: awake and alert Pain management: pain level controlled Vital Signs Assessment: post-procedure vital signs reviewed and stable Respiratory status: spontaneous breathing, nonlabored ventilation and respiratory function stable Cardiovascular status: stable Postop Assessment: no headache, no backache and epidural receding Anesthetic complications: no  No notable events documented.  Last Vitals:  Vitals:   06/01/22 0137 06/01/22 0523  BP: 123/79 97/68  Pulse: (!) 105 86  Resp: 16 18  Temp: 36.4 C 36.7 C    Last Pain:  Vitals:   06/01/22 0523  TempSrc: Oral  PainSc:    Pain Goal: Patients Stated Pain Goal: 2 (05/31/22 1930)                 Trellis Paganini

## 2022-06-02 LAB — GLUCOSE, CAPILLARY
Glucose-Capillary: 123 mg/dL — ABNORMAL HIGH (ref 70–99)
Glucose-Capillary: 69 mg/dL — ABNORMAL LOW (ref 70–99)

## 2022-06-02 MED ORDER — LOPERAMIDE HCL 2 MG PO CAPS
4.0000 mg | ORAL_CAPSULE | Freq: Every day | ORAL | Status: DC
  Administered 2022-06-02: 4 mg via ORAL
  Filled 2022-06-02: qty 2

## 2022-06-02 MED ORDER — IBUPROFEN 600 MG PO TABS: 600.0000 mg | ORAL_TABLET | Freq: Four times a day (QID) | ORAL | 0 refills | Status: DC

## 2022-06-02 MED ORDER — ACETAMINOPHEN 325 MG PO TABS: 650.0000 mg | ORAL_TABLET | ORAL | 0 refills | Status: DC | PRN

## 2022-06-02 MED ORDER — OXYCODONE HCL 5 MG PO TABS: 5.0000 mg | ORAL_TABLET | ORAL | 0 refills | Status: DC | PRN

## 2022-06-02 MED ORDER — GLYBURIDE 2.5 MG PO TABS: 2.5000 mg | ORAL_TABLET | Freq: Every day | ORAL | 2 refills | Status: DC

## 2022-06-02 NOTE — Progress Notes (Signed)
Elizabeth Warren is a 42 y.o. [id number] at [redacted]w[redacted]d by LMP admitted for IOL for T2DM on insulin, CBGs well controlled, EFW 57%.   Subjective: Pt comfortable with epidural. Pt husband in room for support.   Objective: BP 112/72 (BP Location: Right Arm)   Pulse 96   Temp 98.1 F (36.7 C) (Oral)   Resp 18   Ht 5' (1.524 m)   Wt 87.6 kg   LMP 08/29/2021 (Exact Date)   SpO2 100%   Breastfeeding Unknown   BMI 37.72 kg/m  I/O last 3 completed shifts: In: -  Out: 300 [Urine:300] No intake/output data recorded.  FHT:  FHR: 135 bpm, variability: moderate,  accelerations:  Present,  decelerations:  Absent UC:   regular, every 3 minutes SVE:   6/100/-1, vertex, exam by Sharen Counter, CNM Labs: Lab Results  Component Value Date   WBC 8.3 05/30/2022   HGB 12.6 05/30/2022   HCT 36.9 05/30/2022   MCV 83.3 05/30/2022   PLT 314 05/30/2022    Assessment / Plan: IOL for T2DM on insulin  Labor: Progressing normally Preeclampsia:   n/a Fetal Wellbeing:  Category I Pain Control:  Epidural I/D:   GBS neg Anticipated MOD:  NSVD  Sharen Counter, CNM 06/02/2022, 6:31 PM

## 2022-06-02 NOTE — Progress Notes (Signed)
Pt has eaten breakfast without a current glucose check.  Pt stated she refuses Novolog sliding scale insulin this morning.

## 2022-06-04 ENCOUNTER — Telehealth: Payer: Self-pay

## 2022-06-04 NOTE — Telephone Encounter (Signed)
Patient calls nurse line requesting refills on Lantus, sliding scale insulin, prenatal vitamins and Loperamide.   She states that she was told to resume insulin after having her baby. She states that she took her last dose of Lantus last night. Fasting blood sugar this AM was 95.These medications are not on her medication list.   She is also asking if insulin is safe to administer while breastfeeding.   If appropriate, please send prescriptions to [facility] on [address].   Please advise.   Veronda Prude, RN

## 2022-06-05 ENCOUNTER — Other Ambulatory Visit: Payer: Self-pay

## 2022-06-05 ENCOUNTER — Encounter: Payer: Self-pay | Admitting: Student

## 2022-06-05 ENCOUNTER — Ambulatory Visit (INDEPENDENT_AMBULATORY_CARE_PROVIDER_SITE_OTHER): Payer: Medicaid Other | Admitting: Student

## 2022-06-05 VITALS — BP 131/99 | HR 77 | Ht 60.0 in | Wt 183.0 lb

## 2022-06-05 DIAGNOSIS — E1165 Type 2 diabetes mellitus with hyperglycemia: Secondary | ICD-10-CM | POA: Diagnosis not present

## 2022-06-05 DIAGNOSIS — K529 Noninfective gastroenteritis and colitis, unspecified: Secondary | ICD-10-CM

## 2022-06-05 LAB — POCT GLYCOSYLATED HEMOGLOBIN (HGB A1C): Hemoglobin A1C: 6 % — AB (ref 4.0–5.6)

## 2022-06-05 MED ORDER — INSULIN GLARGINE 100 UNIT/ML SOLOSTAR PEN: 8.0000 [IU] | PEN_INJECTOR | SUBCUTANEOUS | 1 refills | Status: DC

## 2022-06-05 MED ORDER — LOPERAMIDE HCL 2 MG PO CAPS
2.0000 mg | ORAL_CAPSULE | ORAL | 5 refills | Status: DC | PRN
Start: 2022-06-05 — End: 2022-06-14

## 2022-06-05 MED ORDER — WESTAB PLUS 27-1 MG PO TABS: 1.0000 | ORAL_TABLET | Freq: Every day | ORAL | 2 refills | Status: DC

## 2022-06-05 NOTE — Assessment & Plan Note (Signed)
>>  ASSESSMENT AND PLAN FOR CHRONIC DIARRHEA WRITTEN ON 06/05/2022 12:09 PM BY Bess Kinds, MD  Patient notes she's had chronic diarrhea since have her gallbladder removed. She has undergone EGD and colonoscopy that were normal. It is believed the diarrhea is coming from her bile and she is on chronic loperamide for it.  -Refill loperamide

## 2022-06-05 NOTE — Telephone Encounter (Signed)
Called patient and scheduled same day appointment with Dr. Barbaraann Faster.   Veronda Prude, RN

## 2022-06-05 NOTE — Assessment & Plan Note (Signed)
Patient comes in to switch insulin meds over. Was on levemir 36 units daily, but had some lantus leftover and has started back on her original dose of lantus, 10 units. Reports she was on that dose for 2 years prior. She reports taking 15 units if she eats a lot of sweets. Reports CBG's at home ranging from 90-100. Patient can have lower dose of lantus as she's at risk for hypoglycemia. A1c is good today, 6.0, and improved from last (6.8).  -Lantus 8 units daily -f/u 3 months for A1c check

## 2022-06-05 NOTE — Progress Notes (Signed)
  SUBJECTIVE:   CHIEF COMPLAINT / HPI:   Restarting DM meds Reports lantus works well for last 3 years. Has started taking 10 units of lantus.  Cbg this am: 90-100 at times. Was on lantus prior to pregnancy and taking 10 units at that time.   Diarrhea: Taking imodium every day, for diarrhea 2/2 gallbladder removal. Needs refill of imodium. Went to 3 specialist and came to find out that it is from gallbladder removal. Had colonoscopy and endoscopy.   PERTINENT  PMH / PSH:    Patient Care Team: Erick Alley, DO as PCP - General (Family Medicine) Constant, Peggy, MD as PCP - OBGYN (Obstetrics and Gynecology) OBJECTIVE:  BP (!) 131/99   Pulse 77   Ht 5' (1.524 m)   Wt 183 lb (83 kg)   LMP 08/29/2021 (Exact Date)   SpO2 100%   BMI 35.74 kg/m  Physical Exam Constitutional:      General: She is not in acute distress.    Appearance: Normal appearance. She is not ill-appearing.  Cardiovascular:     Rate and Rhythm: Normal rate and regular rhythm.     Pulses: Normal pulses.     Heart sounds: Normal heart sounds. No murmur heard.    No friction rub. No gallop.  Pulmonary:     Effort: Pulmonary effort is normal. No respiratory distress.     Breath sounds: Normal breath sounds. No stridor. No wheezing, rhonchi or rales.  Abdominal:     General: There is no distension.     Palpations: Abdomen is soft.     Tenderness: There is no abdominal tenderness.  Neurological:     Mental Status: She is alert.     ASSESSMENT/PLAN:  Type 2 diabetes mellitus with hyperglycemia, unspecified whether long term insulin use (HCC) Assessment & Plan: Patient comes in to switch insulin meds over. Was on levemir 36 units daily, but had some lantus leftover and has started back on her original dose of lantus, 10 units. Reports she was on that dose for 2 years prior. She reports taking 15 units if she eats a lot of sweets. Reports CBG's at home ranging from 90-100. Patient can have lower dose of lantus as  she's at risk for hypoglycemia. A1c is good today, 6.0, and improved from last (6.8).  -Lantus 8 units daily -f/u 3 months for A1c check  Orders: -     POCT glycosylated hemoglobin (Hb A1C)  Chronic diarrhea Assessment & Plan: Patient notes she's had chronic diarrhea since have her gallbladder removed. She has undergone EGD and colonoscopy that were normal. It is believed the diarrhea is coming from her bile and she is on chronic loperamide for it.  -Refill loperamide  Orders: -     Loperamide HCl; Take 1 capsule (2 mg total) by mouth as needed for diarrhea or loose stools.  Dispense: 30 capsule; Refill: 5  Other orders -     WesTab Plus; Take 1 tablet by mouth daily.  Dispense: 90 tablet; Refill: 2 -     Insulin Glargine; Inject 8 Units into the skin every morning.  Dispense: 15 mL; Refill: 1   No follow-ups on file. Bess Kinds, MD 06/05/2022, 12:09 PM PGY-2, [facility]

## 2022-06-05 NOTE — Assessment & Plan Note (Signed)
Patient notes she's had chronic diarrhea since have her gallbladder removed. She has undergone EGD and colonoscopy that were normal. It is believed the diarrhea is coming from her bile and she is on chronic loperamide for it.  -Refill loperamide

## 2022-06-05 NOTE — Patient Instructions (Addendum)
It was great to see you! Thank you for allowing me to participate in your care!  Our plans for today:  - Diabetes Take 8 units of Lantus a day. You don't need to take 10. If you eat a lot of sugar/carbohydrates, don't take any extra. Taking extra can make your blood sugar get to low. This can be life threatening  A1c: Your A1c today was 6.0. This is great and improved from last time. Our goal is for your A1c to stay under 7.0  - Diarrhea I've refilled your loperamide. Take it as needed. I sent in 5 refills, so you should be good for the next 6 months  Take care and seek immediate care sooner if you develop any concerns.   Dr. Bess Kinds, MD [facility]

## 2022-06-11 ENCOUNTER — Telehealth (HOSPITAL_COMMUNITY): Payer: Self-pay

## 2022-06-11 NOTE — Telephone Encounter (Signed)
Patient reports feeling good. Patient declines questions/concerns about her health and healing.  Patient reports that baby is doing good. Baby sleeps in a crib. Patient declines any questions or concerns about baby. Patient ended call states everything is good.   EPDS-unable to complete-patient ended call.  Suann Larry  [facility]  Perinatal Services   06/11/22,1935

## 2022-06-14 ENCOUNTER — Telehealth: Payer: Self-pay

## 2022-06-14 ENCOUNTER — Other Ambulatory Visit: Payer: Self-pay | Admitting: Student

## 2022-06-14 DIAGNOSIS — K529 Noninfective gastroenteritis and colitis, unspecified: Secondary | ICD-10-CM

## 2022-06-14 MED ORDER — LOPERAMIDE HCL 2 MG PO CAPS
2.0000 mg | ORAL_CAPSULE | ORAL | 5 refills | Status: DC | PRN
Start: 2022-06-14 — End: 2022-09-09

## 2022-06-14 NOTE — Telephone Encounter (Signed)
Sent in new Rx w/ specified max dose

## 2022-06-14 NOTE — Telephone Encounter (Signed)
Received call from pharmacy regarding Loperamide prescription. Pharmacist reports that prescription needs to be updated to include maximum daily dosage or specific frequency as to how often the patient can take medication.   Please send in new prescription to [facility] on [address].   Veronda Prude, RN

## 2022-06-14 NOTE — Progress Notes (Signed)
Added max daily dose to the instructions, for max of 8 mg in a day.

## 2022-07-07 DIAGNOSIS — M25442 Effusion, left hand: Secondary | ICD-10-CM | POA: Diagnosis not present

## 2022-07-07 DIAGNOSIS — M79642 Pain in left hand: Secondary | ICD-10-CM | POA: Diagnosis not present

## 2022-07-15 ENCOUNTER — Ambulatory Visit (INDEPENDENT_AMBULATORY_CARE_PROVIDER_SITE_OTHER): Payer: Medicaid Other | Admitting: Obstetrics and Gynecology

## 2022-07-15 ENCOUNTER — Encounter: Payer: Self-pay | Admitting: Obstetrics and Gynecology

## 2022-07-15 DIAGNOSIS — Z3202 Encounter for pregnancy test, result negative: Secondary | ICD-10-CM

## 2022-07-15 DIAGNOSIS — Z30430 Encounter for insertion of intrauterine contraceptive device: Secondary | ICD-10-CM

## 2022-07-15 DIAGNOSIS — Z975 Presence of (intrauterine) contraceptive device: Secondary | ICD-10-CM | POA: Insufficient documentation

## 2022-07-15 LAB — POCT URINE PREGNANCY: Preg Test, Ur: NEGATIVE

## 2022-07-15 MED ORDER — IBUPROFEN 600 MG PO TABS: 600.0000 mg | ORAL_TABLET | Freq: Four times a day (QID) | ORAL | 3 refills | Status: DC | PRN

## 2022-07-15 MED ORDER — LEVONORGESTREL 20 MCG/DAY IU IUD
1.0000 | INTRAUTERINE_SYSTEM | Freq: Once | INTRAUTERINE | Status: AC
  Administered 2022-07-15: 1 via INTRAUTERINE

## 2022-07-15 NOTE — Patient Instructions (Signed)

## 2022-07-15 NOTE — Addendum Note (Signed)
Addended by: Jearld Adjutant on: 07/15/2022 04:57 PM   Modules accepted: Orders

## 2022-07-15 NOTE — Progress Notes (Signed)
Post Partum Visit Note  Elizabeth Warren is a 42 y.o. [id number] female who presents for a postpartum visit. She is 6 weeks postpartum following a normal spontaneous vaginal delivery.  I have fully reviewed the prenatal and intrapartum course. The delivery was at 39  gestational weeks.  Anesthesia: epidural. Postpartum course has been good. Baby is doing well. Baby is feeding by both breast and bottle - Similac Advance. Bleeding staining only. Bowel function is normal. Bladder function is normal. Patient is not sexually active. Contraception method is IUD. Postpartum depression screening: negative.   The pregnancy intention screening data noted above was reviewed. Potential methods of contraception were discussed. The patient elected to proceed with No data recorded.    Health Maintenance Due  Topic Date Due   COVID-19 Vaccine (1) Never done   FOOT EXAM  Never done   OPHTHALMOLOGY EXAM  Never done    The following portions of the patient's history were reviewed and updated as appropriate: allergies, current medications, past family history, past medical history, past social history, past surgical history, and problem list.  Review of Systems Pertinent items are noted in HPI.  Objective:  Ht 5' (1.524 m)   Wt 189 lb 9.6 oz (86 kg)   LMP 08/29/2021 (Exact Date)   BMI 37.03 kg/m    General:  alert and cooperative   Breasts:  not indicated  Lungs: Normal effor t  Heart:  Regular rate and rhythm  Abdomen: soft, non-tender; bowel sounds normal; no masses,  no organomegaly      GU exam:  normal, IUD placed        Assessment:  1. Postpartum examination following vaginal delivery Doing well, no concerns.  Follows Fam med for T2DM  2. Encounter for IUD insertion Mirena IUD placed today   Plan:   Essential components of care per ACOG recommendations:  1.  Mood and well being: Patient with negative depression screening today. Reviewed local resources for support.  - Patient  tobacco use? No.   - hx of drug use? No.    2. Infant care and feeding:  -Patient currently breastmilk feeding? Yes. Reviewed importance of draining breast regularly to support lactation.  -Social determinants of health (SDOH) reviewed in EPIC. No concerns  3. Sexuality, contraception and birth spacing - Patient does not want a pregnancy in the next year.  Desired family size is 6 children.  - Reviewed reproductive life planning. Reviewed contraceptive methods based on pt preferences and effectiveness.  Patient desired IUD or IUS today.   - Discussed birth spacing of 18 months  4. Sleep and fatigue -Encouraged family/partner/community support of 4 hrs of uninterrupted sleep to help with mood and fatigue  5. Physical Recovery  - Discussed patients delivery and complications. She describes her labor as good. - Patient had a Vaginal, no problems at delivery. Patient had a 1st degree laceration. Perineal healing reviewed. Patient expressed understanding - Patient has urinary incontinence? No. - Patient is safe to resume physical and sexual activity  6.  Health Maintenance - HM due items addressed Yes - Last pap smear  Diagnosis  Date Value Ref Range Status  12/07/2021   Final   - Negative for intraepithelial lesion or malignancy (NILM)   Pap smear not done at today's visit.  -Breast Cancer screening indicated? Yes, declined referral   7. Chronic Disease/Pregnancy Condition follow up:  T2DM, following family med, already had appointment  - PCP follow up  Return in one month  for IUD string check Future Appointments  Date Time Provider Department Center  08/12/2022  2:50 PM Leftwich-Kirby, Wilmer Floor, CNM [facility] None     Albertine Grates, FNP [facility] [employer], [facility]

## 2022-08-12 ENCOUNTER — Ambulatory Visit: Payer: Medicaid Other | Admitting: Advanced Practice Midwife

## 2022-08-13 ENCOUNTER — Other Ambulatory Visit: Payer: Self-pay

## 2022-08-13 MED ORDER — DICYCLOMINE HCL 20 MG PO TABS: 20.0000 mg | ORAL_TABLET | Freq: Three times a day (TID) | ORAL | 1 refills | Status: DC

## 2022-08-20 ENCOUNTER — Encounter (HOSPITAL_BASED_OUTPATIENT_CLINIC_OR_DEPARTMENT_OTHER): Payer: Self-pay

## 2022-08-20 ENCOUNTER — Emergency Department (HOSPITAL_BASED_OUTPATIENT_CLINIC_OR_DEPARTMENT_OTHER): Payer: Medicaid Other

## 2022-08-20 ENCOUNTER — Emergency Department (HOSPITAL_BASED_OUTPATIENT_CLINIC_OR_DEPARTMENT_OTHER)
Admission: EM | Admit: 2022-08-20 | Discharge: 2022-08-20 | Disposition: A | Discharge status: Home / Self Care | Payer: Medicaid Other | Attending: Emergency Medicine | Admitting: Emergency Medicine

## 2022-08-20 DIAGNOSIS — I10 Essential (primary) hypertension: Secondary | ICD-10-CM | POA: Diagnosis not present

## 2022-08-20 DIAGNOSIS — Z79899 Other long term (current) drug therapy: Secondary | ICD-10-CM | POA: Diagnosis not present

## 2022-08-20 DIAGNOSIS — R1031 Right lower quadrant pain: Secondary | ICD-10-CM | POA: Insufficient documentation

## 2022-08-20 DIAGNOSIS — R109 Unspecified abdominal pain: Secondary | ICD-10-CM | POA: Diagnosis not present

## 2022-08-20 DIAGNOSIS — Z7984 Long term (current) use of oral hypoglycemic drugs: Secondary | ICD-10-CM | POA: Diagnosis not present

## 2022-08-20 DIAGNOSIS — Z794 Long term (current) use of insulin: Secondary | ICD-10-CM | POA: Diagnosis not present

## 2022-08-20 DIAGNOSIS — R1032 Left lower quadrant pain: Secondary | ICD-10-CM | POA: Diagnosis not present

## 2022-08-20 DIAGNOSIS — E119 Type 2 diabetes mellitus without complications: Secondary | ICD-10-CM | POA: Insufficient documentation

## 2022-08-20 DIAGNOSIS — R197 Diarrhea, unspecified: Secondary | ICD-10-CM | POA: Insufficient documentation

## 2022-08-20 DIAGNOSIS — N83292 Other ovarian cyst, left side: Secondary | ICD-10-CM | POA: Diagnosis not present

## 2022-08-20 LAB — COMPREHENSIVE METABOLIC PANEL
ALT: 23 U/L (ref 0–44)
AST: 17 U/L (ref 15–41)
Albumin: 4.5 g/dL (ref 3.5–5.0)
Alkaline Phosphatase: 50 U/L (ref 38–126)
Anion gap: 10 (ref 5–15)
BUN: 16 mg/dL (ref 6–20)
CO2: 28 mmol/L (ref 22–32)
Calcium: 9.7 mg/dL (ref 8.9–10.3)
Chloride: 102 mmol/L (ref 98–111)
Creatinine, Ser: 0.59 mg/dL (ref 0.44–1.00)
GFR, Estimated: >60 mL/min (ref >60)
Glucose, Bld: 94 mg/dL (ref 70–99)
Potassium: 3.8 mmol/L (ref 3.5–5.1)
Sodium: 140 mmol/L (ref 135–145)
Total Bilirubin: 0.2 mg/dL — ABNORMAL LOW (ref 0.3–1.2)
Total Protein: 8.3 g/dL — ABNORMAL HIGH (ref 6.5–8.1)

## 2022-08-20 LAB — URINALYSIS, ROUTINE W REFLEX MICROSCOPIC
Bacteria, UA: NONE SEEN
Bilirubin Urine: NEGATIVE
Glucose, UA: NEGATIVE mg/dL
Ketones, ur: NEGATIVE mg/dL
Leukocytes,Ua: NEGATIVE
Nitrite: NEGATIVE
Protein, ur: NEGATIVE mg/dL
Specific Gravity, Urine: 1.019 (ref 1.005–1.030)
pH: 5.5 (ref 5.0–8.0)

## 2022-08-20 LAB — CBC WITH DIFFERENTIAL/PLATELET
Abs Immature Granulocytes: 0.04 10*3/uL (ref 0.00–0.07)
Basophils Absolute: 0.1 10*3/uL (ref 0.0–0.1)
Basophils Relative: 1 %
Eosinophils Absolute: 0.8 10*3/uL — ABNORMAL HIGH (ref 0.0–0.5)
Eosinophils Relative: 9 %
HCT: 39.3 % (ref 36.0–46.0)
Hemoglobin: 13.5 g/dL (ref 12.0–15.0)
Immature Granulocytes: 0 %
Lymphocytes Relative: 31 %
Lymphs Abs: 2.8 10*3/uL (ref 0.7–4.0)
MCH: 29.3 pg (ref 26.0–34.0)
MCHC: 34.4 g/dL (ref 30.0–36.0)
MCV: 85.4 fL (ref 80.0–100.0)
Monocytes Absolute: 0.6 10*3/uL (ref 0.1–1.0)
Monocytes Relative: 7 %
Neutro Abs: 4.7 10*3/uL (ref 1.7–7.7)
Neutrophils Relative %: 52 %
Platelets: 331 10*3/uL (ref 150–400)
RBC: 4.6 MIL/uL (ref 3.87–5.11)
RDW: 14 % (ref 11.5–15.5)
WBC: 9 10*3/uL (ref 4.0–10.5)
nRBC: 0 % (ref 0.0–0.2)

## 2022-08-20 LAB — LIPASE, BLOOD: Lipase: 24 U/L (ref 11–51)

## 2022-08-20 LAB — PREGNANCY, URINE: Preg Test, Ur: NEGATIVE

## 2022-08-20 MED ORDER — IOHEXOL 300 MG/ML  SOLN
100.0000 mL | Freq: Once | INTRAMUSCULAR | Status: AC | PRN
  Administered 2022-08-20: 85 mL via INTRAVENOUS

## 2022-08-20 NOTE — ED Provider Notes (Signed)
[place] EMERGENCY DEPARTMENT AT [facility] Provider Note   CSN: [id number] Arrival date & time: 08/20/22  1613     History  Chief Complaint  Patient presents with   Diarrhea   HPI Elizabeth Warren is a 42 y.o. female with diabetes, hypertension, h/o c diff s/p cholecystectomy 2 years ago presenting for diarrhea.  States has been going on for 2 years.  Started shortly after her cholecystectomy.  Also endorses lower abdominal pain that started about a week ago.  States she has had some vaginal bleeding since she gave birth 2 months ago.  Had IUD placed about 2 weeks ago and her bleeding has improved.  Now only spotting.  Denies nausea vomiting.  States diarrhea is nonbloody.   Diarrhea      Home Medications Prior to Admission medications   Medication Sig Start Date End Date Taking? Authorizing Provider  Accu-Chek Softclix Lancets lancets Use as instructed Patient not taking: Reported on 07/15/2022 11/15/20   Simmons-Jacquese Cassarino, Tawanna Cooler, MD  acetaminophen (TYLENOL) 325 MG tablet Take 2 tablets (650 mg total) by mouth every 4 (four) hours as needed (for pain scale < 4). Patient not taking: Reported on 07/15/2022 06/02/22   Donette Larry, CNM  blood glucose meter kit and supplies KIT Dispense based on patient and insurance preference. Use up to four times daily as directed. Patient not taking: Reported on 05/24/2022 12/07/21   Constant, Peggy, MD  Blood Glucose Monitoring Suppl (ACCU-CHEK GUIDE ME) w/Device KIT 1 Units by Does not apply route every other day. Patient not taking: Reported on 05/24/2022 09/03/19   Celedonio Savage, MD  Blood Pressure Monitoring (BLOOD PRESSURE KIT) DEVI 1 kit by Does not apply route once a week. Patient not taking: Reported on 05/24/2022 10/29/21   Constant, Peggy, MD  dicyclomine (BENTYL) 20 MG tablet Take 1 tablet (20 mg total) by mouth 4 (four) times daily -  before meals and at bedtime for 28 days. 08/13/22 09/10/22  Jerre Simon, MD  glucose blood  (ACCU-CHEK GUIDE) test strip USE AS DIRECTED Patient not taking: Reported on 05/24/2022 08/22/21   Erick Alley, DO  glyBURIDE (DIABETA) 2.5 MG tablet Take 1 tablet (2.5 mg total) by mouth daily with breakfast. Patient not taking: Reported on 07/15/2022 06/02/22   Mercado-Ortiz, Lahoma Crocker, DO  ibuprofen (ADVIL) 600 MG tablet Take 1 tablet (600 mg total) by mouth every 6 (six) hours. 06/02/22   Donette Larry, CNM  ibuprofen (ADVIL) 600 MG tablet Take 1 tablet (600 mg total) by mouth every 6 (six) hours as needed. 07/15/22   Sue Lush, FNP  INS SYRINGE/NEEDLE .5CC/27G 27G X 1/2" 0.5 ML MISC 1 Syringe by Does not apply route daily. Patient not taking: Reported on 05/24/2022 03/26/22   Lennart Pall, MD  insulin glargine (LANTUS) 100 UNIT/ML Solostar Pen Inject 8 Units into the skin every morning. 06/05/22   Bess Kinds, MD  Lancets Misc. (ACCU-CHEK SOFTCLIX LANCET DEV) KIT 1 kit by Does not apply route daily. Patient not taking: Reported on 05/24/2022 11/06/21   Federico Flake, MD  loperamide (IMODIUM) 2 MG capsule Take 1 capsule (2 mg total) by mouth as needed for diarrhea or loose stools. Can repeat max three times in a day 06/14/22   Bess Kinds, MD  oxyCODONE (OXY IR/ROXICODONE) 5 MG immediate release tablet Take 1 tablet (5 mg total) by mouth every 4 (four) hours as needed (pain scale 4-7). Patient not taking: Reported on 07/15/2022 06/02/22   Donette Larry,  CNM  pantoprazole (PROTONIX) 40 MG tablet Take 1 tablet (40 mg total) by mouth daily. Patient not taking: Reported on 07/15/2022 04/08/22   Constant, Peggy, MD  Prenatal Vit-Fe Fumarate-FA (WESTAB PLUS) 27-1 MG TABS Take 1 tablet by mouth daily. 06/05/22   Bess Kinds, MD      Allergies    Patient has no known allergies.    Review of Systems   Review of Systems  Gastrointestinal:  Positive for diarrhea.    Physical Exam Updated Vital Signs BP (!) 137/102 (BP Location: Right Arm)   Pulse 95   Temp 98.7 F (37.1  C) (Oral)   Resp 18   SpO2 100%  Physical Exam Vitals and nursing note reviewed.  HENT:     Head: Normocephalic and atraumatic.     Mouth/Throat:     Mouth: Mucous membranes are moist.  Eyes:     General:        Right eye: No discharge.        Left eye: No discharge.     Conjunctiva/sclera: Conjunctivae normal.  Cardiovascular:     Rate and Rhythm: Normal rate and regular rhythm.     Pulses: Normal pulses.     Heart sounds: Normal heart sounds.  Pulmonary:     Effort: Pulmonary effort is normal.     Breath sounds: Normal breath sounds.  Abdominal:     General: Abdomen is flat.     Palpations: Abdomen is soft.     Tenderness: There is abdominal tenderness in the right lower quadrant, suprapubic area and left lower quadrant.  Skin:    General: Skin is warm and dry.  Neurological:     General: No focal deficit present.  Psychiatric:        Mood and Affect: Mood normal.     ED Results / Procedures / Treatments   Labs (all labs ordered are listed, but only abnormal results are displayed) Labs Reviewed  CBC WITH DIFFERENTIAL/PLATELET - Abnormal; Notable for the following components:      Result Value   Eosinophils Absolute 0.8 (*)    All other components within normal limits  COMPREHENSIVE METABOLIC PANEL - Abnormal; Notable for the following components:   Total Protein 8.3 (*)    Total Bilirubin 0.2 (*)    All other components within normal limits  URINALYSIS, ROUTINE W REFLEX MICROSCOPIC - Abnormal; Notable for the following components:   Hgb urine dipstick LARGE (*)    All other components within normal limits  C DIFFICILE QUICK SCREEN W PCR REFLEX    STOOL CULTURE  OVA + PARASITE EXAM  LIPASE, BLOOD  PREGNANCY, URINE    EKG None  Radiology CT ABDOMEN PELVIS W CONTRAST  Result Date: 08/20/2022 CLINICAL DATA:  Acute nonlocalized abdominal pain. Vaginal bleeding for 2 months. Diarrhea for 2 years. EXAM: CT ABDOMEN AND PELVIS WITH CONTRAST TECHNIQUE:  Multidetector CT imaging of the abdomen and pelvis was performed using the standard protocol following bolus administration of intravenous contrast. RADIATION DOSE REDUCTION: This exam was performed according to the departmental dose-optimization program which includes automated exposure control, adjustment of the mA and/or kV according to patient size and/or use of iterative reconstruction technique. CONTRAST:  85mL OMNIPAQUE IOHEXOL 300 MG/ML  SOLN COMPARISON:  None Available. FINDINGS: Lower chest: Lung bases are clear. Hepatobiliary: No focal liver abnormality is seen. Status post cholecystectomy. No biliary dilatation. Pancreas: Unremarkable. No pancreatic ductal dilatation or surrounding inflammatory changes. Spleen: Normal in size without focal abnormality. Adrenals/Urinary Tract:  Adrenal glands are unremarkable. Kidneys are normal, without renal calculi, focal lesion, or hydronephrosis. Bladder is unremarkable. Stomach/Bowel: Stomach, small bowel, and colon are not abnormally distended. Scattered stool throughout the colon. No wall thickening or inflammatory changes. Appendix is normal. Vascular/Lymphatic: No significant vascular findings are present. No enlarged abdominal or pelvic lymph nodes. Reproductive: Uterus and ovaries are not significantly enlarged. Intrauterine device is present with central position. Left ovarian simple cyst measuring 3.1 cm diameter. Other: No free air or free fluid in the abdomen. Abdominal wall musculature appears intact. Musculoskeletal: No acute or significant osseous findings. IMPRESSION: 1. No evidence of bowel obstruction or inflammation. 2. Intrauterine device is present. 3. Left ovarian simple-appearing cyst measuring 3.1 cm. No follow-up imaging is recommended. Reference: JACR 2020 Feb;17(2):248-254 Electronically Signed   By: Burman Nieves M.D.   On: 08/20/2022 19:42    Procedures Procedures    Medications Ordered in ED Medications  iohexol (OMNIPAQUE) 300  MG/ML solution 100 mL (85 mLs Intravenous Contrast Given 08/20/22 1846)    ED Course/ Medical Decision Making/ A&P                             Medical Decision Making Amount and/or Complexity of Data Reviewed Labs: ordered. Radiology: ordered.  Risk Prescription drug management.   Initial Impression and Ddx 42 year old well-appearing female presenting for diarrhea.  Exam notable for lower abdominal tenderness.  DDx includes appendicitis, diverticulitis, UTI, pyelonephritis, ovarian torsion, ectopic pregnancy. Patient PMH that increases complexity of ED encounter: diabetes, hypertension, h/o c diff s/p cholecystectomy 2 years  Interpretation of Diagnostics - I independent reviewed and interpreted the labs as followed: Hematuria  - I independently visualized the following imaging with scope of interpretation limited to determining acute life threatening conditions related to emergency care: CT ab/pel, which revealed left ovarian cyst but no other acute findings  Patient Reassessment and Ultimate Disposition/Management Patient appears clinically well, hemodynamically stable and in no acute distress.  Had mild tenderness on exam.  Fortunately CT scan was unremarkable.  Did reveal ovarian cyst.  Feel this is incidental finding have suspicion for ovarian torsion given tenderness only mild and on reassessment she stated that had improved.  Suspect her diarrhea is chronic.  Sent off stool samples.  Advised to follow-up with GI.  Discussed pertinent return precautions.  Vital stable discharge.  Discharged home.  Patient management required discussion with the following services or consulting groups:  None  Complexity of Problems Addressed Acute complicated illness or Injury  Additional Data Reviewed and Analyzed Further history obtained from: Past medical history and medications listed in the EMR, Prior ED visit notes, and Recent discharge summary  Patient Encounter Risk  Assessment None         Final Clinical Impression(s) / ED Diagnoses Final diagnoses:  Diarrhea, unspecified type  Abdominal pain, unspecified abdominal location    Rx / DC Orders ED Discharge Orders     None         Gareth Eagle, PA-C 08/20/22 2024    Horton, Clabe Seal, DO 08/20/22 2347

## 2022-08-20 NOTE — ED Triage Notes (Signed)
Patient here POV from Home.  Notes Vaginal Bleeding for 2 Months. Also notes Diarrhea for 2 Years. States she had C.Diff some time ago but has since subsided after treatment but still notes Diarrhea. No Blood noted in Stool.   NAD Noted during triage. A&Ox4. GCS 15. Ambulatory.

## 2022-08-20 NOTE — ED Notes (Signed)
Patient transported to CT 

## 2022-08-20 NOTE — Discharge Instructions (Signed)
Evaluation for your abdominal pain diarrhea was overall reassuring.  Recommend you follow-up with gastroenterology.  If you have worsening abdominal pain, blood in the stool or vomit, new fever or any other concerning symptom please return emergency department for evaluation.

## 2022-08-21 LAB — C DIFFICILE QUICK SCREEN W PCR REFLEX
C Diff antigen: NEGATIVE
C Diff interpretation: NOT DETECTED
C Diff toxin: NEGATIVE

## 2022-08-24 LAB — STOOL CULTURE

## 2022-08-24 LAB — STOOL CULTURE REFLEX - RSASHR

## 2022-08-25 LAB — STOOL CULTURE: E coli, Shiga toxin Assay: NEGATIVE

## 2022-08-25 LAB — STOOL CULTURE REFLEX - CMPCXR

## 2022-09-02 ENCOUNTER — Other Ambulatory Visit: Payer: Self-pay | Admitting: Student

## 2022-09-02 DIAGNOSIS — E1165 Type 2 diabetes mellitus with hyperglycemia: Secondary | ICD-10-CM

## 2022-09-02 DIAGNOSIS — K529 Noninfective gastroenteritis and colitis, unspecified: Secondary | ICD-10-CM

## 2022-09-09 ENCOUNTER — Other Ambulatory Visit: Payer: Self-pay

## 2022-09-09 DIAGNOSIS — K529 Noninfective gastroenteritis and colitis, unspecified: Secondary | ICD-10-CM

## 2022-09-09 MED ORDER — ACCU-CHEK GUIDE VI STRP: ORAL_STRIP | 3 refills | Status: DC

## 2022-09-09 NOTE — Addendum Note (Signed)
Addended by: Veronda Prude on: 09/09/2022 09:19 AM   Modules accepted: Orders

## 2022-09-09 NOTE — Telephone Encounter (Signed)
Patient calls nurse line requesting refill on testing strips.   For billing purposes, specific directions need to be included.   Patient reports that she tests up to three times per day.   Included this and dx code in prescription and resent.

## 2022-09-10 MED ORDER — LOPERAMIDE HCL 2 MG PO CAPS
2.0000 mg | ORAL_CAPSULE | ORAL | 5 refills | Status: DC | PRN
Start: 2022-09-10 — End: 2023-01-30

## 2022-11-19 ENCOUNTER — Other Ambulatory Visit: Payer: Self-pay | Admitting: Obstetrics and Gynecology

## 2022-12-03 ENCOUNTER — Other Ambulatory Visit: Payer: Self-pay

## 2022-12-03 ENCOUNTER — Other Ambulatory Visit: Payer: Self-pay | Admitting: Student

## 2022-12-20 ENCOUNTER — Other Ambulatory Visit: Payer: Self-pay

## 2023-01-30 ENCOUNTER — Ambulatory Visit (INDEPENDENT_AMBULATORY_CARE_PROVIDER_SITE_OTHER): Payer: Medicaid Other | Admitting: Student

## 2023-01-30 VITALS — BP 130/80 | HR 106 | Temp 98.6°F | Ht 63.0 in | Wt 203.0 lb

## 2023-01-30 DIAGNOSIS — S96912A Strain of unspecified muscle and tendon at ankle and foot level, left foot, initial encounter: Secondary | ICD-10-CM

## 2023-01-30 DIAGNOSIS — K529 Noninfective gastroenteritis and colitis, unspecified: Secondary | ICD-10-CM | POA: Diagnosis not present

## 2023-01-30 MED ORDER — ACCU-CHEK GUIDE TEST VI STRP: ORAL_STRIP | 12 refills | Status: DC

## 2023-01-30 MED ORDER — LOPERAMIDE HCL 2 MG PO CAPS
2.0000 mg | ORAL_CAPSULE | ORAL | 3 refills | Status: DC | PRN
Start: 2023-01-30 — End: 2023-03-21

## 2023-01-30 MED ORDER — DICYCLOMINE HCL 20 MG PO TABS
20.0000 mg | ORAL_TABLET | Freq: Three times a day (TID) | ORAL | 3 refills | Status: DC | PRN
Start: 2023-01-30 — End: 2023-03-21

## 2023-01-30 NOTE — Patient Instructions (Signed)
Ms. Elizabeth Warren,  I am so glad that your ankle is doing better.  I am refilling your loperamide and your dicyclomine.  Come back and see [place] if you have any issues, J Dorothyann Gibbs, MD

## 2023-01-31 NOTE — Assessment & Plan Note (Signed)
>>  ASSESSMENT AND PLAN FOR CHRONIC DIARRHEA WRITTEN ON 01/31/2023  9:28 AM BY Alicia Amel, MD  Longstanding issue. She's on a bit of an atypical regimen with daily loperamide but it seems to be working well so we will keep doing what works. - Loperamide 2mg  daily - Bentyl 20mg  PRN

## 2023-01-31 NOTE — Progress Notes (Signed)
    SUBJECTIVE:   CHIEF COMPLAINT / HPI:   L Ankle [place] in [place] days ago and stepped off a curb, rolling her ankle. Has been treating just with relative rest and is feeling better already. She is not too concerned but would like me to take a look.   Chronic Diarrhea Chronic symptoms since she had her gallbladder removed a few years ago. Has been stably controlled on a regimen of daily loperamide and PRN Bentyl for two years now. Requesting refills of both of these medications.    OBJECTIVE:   BP 130/80   Pulse (!) 106   Temp 98.6 F (37 C)   Ht 5\' 3"  (1.6 m)   Wt 203 lb (92.1 kg)   SpO2 99%   BMI 35.96 kg/m   Gen: well-appearing, talkative and in good spirits Abd: Non-tender, non-distended, without organomegaly, rebound, or guarding Ext: L ankle without edema or deformity, no tenderness to palpation, negative anterior drawer and talar tilt.   ASSESSMENT/PLAN:   Assessment & Plan Chronic diarrhea Longstanding issue. She's on a bit of an atypical regimen with daily loperamide but it seems to be working well so we will keep doing what works. - Loperamide 2mg  daily - Bentyl 20mg  PRN  Strain of left ankle, initial encounter It seems that she is already healing well from this injury. Benign exam. No further workup or intervention is warranted.    Eliezer Mccoy, MD [facility]

## 2023-01-31 NOTE — Assessment & Plan Note (Signed)
Longstanding issue. She's on a bit of an atypical regimen with daily loperamide but it seems to be working well so we will keep doing what works. - Loperamide 2mg  daily - Bentyl 20mg  PRN

## 2023-03-21 ENCOUNTER — Ambulatory Visit: Payer: Medicaid Other | Admitting: Student

## 2023-03-21 VITALS — BP 138/86 | HR 76 | Ht 63.0 in | Wt 201.8 lb

## 2023-03-21 DIAGNOSIS — O24119 Pre-existing diabetes mellitus, type 2, in pregnancy, unspecified trimester: Secondary | ICD-10-CM

## 2023-03-21 DIAGNOSIS — E1165 Type 2 diabetes mellitus with hyperglycemia: Secondary | ICD-10-CM

## 2023-03-21 DIAGNOSIS — K529 Noninfective gastroenteritis and colitis, unspecified: Secondary | ICD-10-CM | POA: Diagnosis not present

## 2023-03-21 LAB — POCT GLYCOSYLATED HEMOGLOBIN (HGB A1C): HbA1c, POC (controlled diabetic range): 8.6 % — AB (ref 0.0–7.0)

## 2023-03-21 MED ORDER — ACCU-CHEK GUIDE TEST VI STRP: ORAL_STRIP | 12 refills | Status: DC

## 2023-03-21 MED ORDER — ACCU-CHEK SOFTCLIX LANCETS MISC: 12 refills | Status: AC

## 2023-03-21 MED ORDER — LOPERAMIDE HCL 2 MG PO CAPS
2.0000 mg | ORAL_CAPSULE | ORAL | 3 refills | Status: DC | PRN
Start: 2023-03-21 — End: 2023-05-15

## 2023-03-21 MED ORDER — DICYCLOMINE HCL 20 MG PO TABS
20.0000 mg | ORAL_TABLET | Freq: Three times a day (TID) | ORAL | 3 refills | Status: DC | PRN
Start: 2023-03-21 — End: 2023-05-26

## 2023-03-21 NOTE — Assessment & Plan Note (Addendum)
Patient comes in for follow-up of her diabetes.  Patient reports she has been eating/snacking more frequently because she has been breast-feeding.  Patient's A1c increased from 6 to 8.6.  Patient was also taking insulin twice daily.  Advised against twice daily dosing, will increase insulin dose with close follow-up. - Continue Lantus 20 units daily - Consider SGLT2/GLP in the future, when patient is not breastfeeding - Follow-up 3 months

## 2023-03-21 NOTE — Patient Instructions (Signed)
It was great to see you! Thank you for allowing me to participate in your care!  I recommend that you always bring your medications to each appointment as this makes it easy to ensure we are on the correct medications and helps [place] not miss when refills are needed.  Our plans for today:  - Diabetes  Your A1c is slightly worse from last time. This is likely related to the amount of eating you've done, now that you are breastfeeding.   Continue lantus 20 units daily    Follow up in 3 months  Take care and seek immediate care sooner if you develop any concerns.   Dr. Bess Kinds, MD [facility]

## 2023-03-21 NOTE — Progress Notes (Signed)
  SUBJECTIVE:   CHIEF COMPLAINT / HPI:   DM2 Meds: Lantus 8 untis CBG were 100-110 previously, but now are ranging from 150-160. Has been taking 15 units of lantus BID for last week. Has been eating a lot more frequently 2/2 breastfeeding.   PERTINENT  PMH / PSH:    OBJECTIVE:  BP 138/86   Pulse 76   Ht 5\' 3"  (1.6 m)   Wt 201 lb 12.8 oz (91.5 kg)   SpO2 98%   BMI 35.75 kg/m  Physical Exam Constitutional:      General: She is not in acute distress.    Appearance: Normal appearance. She is not ill-appearing.  Cardiovascular:     Rate and Rhythm: Normal rate and regular rhythm.     Pulses: Normal pulses.     Heart sounds: Normal heart sounds. No murmur heard.    No friction rub. No gallop.  Pulmonary:     Effort: Pulmonary effort is normal. No respiratory distress.     Breath sounds: Normal breath sounds. No stridor. No wheezing, rhonchi or rales.  Abdominal:     General: Bowel sounds are normal. There is no distension.     Palpations: Abdomen is soft. There is no mass.     Tenderness: There is no abdominal tenderness. There is no guarding or rebound.     Hernia: No hernia is present.  Skin:    Capillary Refill: Capillary refill takes less than 2 seconds.  Neurological:     Mental Status: She is alert.  Psychiatric:        Mood and Affect: Mood normal.        Behavior: Behavior normal.      ASSESSMENT/PLAN:   Assessment & Plan Type 2 diabetes mellitus with hyperglycemia, unspecified whether long term insulin use (HCC) Patient comes in for follow-up of her diabetes.  Patient reports she has been eating/snacking more frequently because she has been breast-feeding.  Patient's A1c increased from 6 to 8.6.  Patient was also taking insulin twice daily.  Advised against twice daily dosing, will increase insulin dose with close follow-up. - Continue Lantus 20 units daily - Consider SGLT2/GLP in the future, when patient is not breastfeeding - Follow-up 3 months No follow-ups  on file. Bess Kinds, MD 03/21/2023, 2:05 PM PGY-3, [facility]

## 2023-04-02 ENCOUNTER — Other Ambulatory Visit: Payer: Self-pay

## 2023-04-02 MED ORDER — ACCU-CHEK GUIDE TEST VI STRP: ORAL_STRIP | 12 refills | Status: DC

## 2023-04-02 NOTE — Telephone Encounter (Signed)
 Pharmacy is requesting rx to have specific directions regarding the frequency of how the patient will be using the test strips and days supply limitations. Please make the corrects and senx rx to pharmacy. Penni Bombard CMA

## 2023-04-10 ENCOUNTER — Telehealth: Payer: Self-pay | Admitting: Student

## 2023-04-10 ENCOUNTER — Other Ambulatory Visit: Payer: Self-pay | Admitting: Student

## 2023-04-10 DIAGNOSIS — E1165 Type 2 diabetes mellitus with hyperglycemia: Secondary | ICD-10-CM

## 2023-04-10 MED ORDER — INSULIN GLARGINE 100 UNIT/ML SOLOSTAR PEN
20.0000 [IU] | PEN_INJECTOR | SUBCUTANEOUS | 1 refills | Status: DC
Start: 2023-04-10 — End: 2023-05-30

## 2023-04-10 NOTE — Telephone Encounter (Signed)
 Patient called stating that she needs a refill of her insulin please

## 2023-04-29 ENCOUNTER — Encounter: Payer: Self-pay | Admitting: Student

## 2023-04-29 ENCOUNTER — Ambulatory Visit: Admitting: Student

## 2023-04-29 VITALS — BP 131/89 | HR 106 | Wt 201.8 lb

## 2023-04-29 DIAGNOSIS — R350 Frequency of micturition: Secondary | ICD-10-CM

## 2023-04-29 LAB — POCT URINALYSIS DIP (MANUAL ENTRY)
Bilirubin, UA: NEGATIVE
Blood, UA: NEGATIVE
Glucose, UA: NEGATIVE mg/dL
Ketones, POC UA: NEGATIVE mg/dL
Leukocytes, UA: NEGATIVE
Nitrite, UA: NEGATIVE
Protein Ur, POC: 30 mg/dL — AB
Spec Grav, UA: 1.025 (ref 1.010–1.025)
Urobilinogen, UA: 0.2 U/dL
pH, UA: 5.5 (ref 5.0–8.0)

## 2023-04-29 LAB — POCT UA - MICROSCOPIC ONLY: WBC, Ur, HPF, POC: NONE SEEN

## 2023-04-29 MED ORDER — FLUCONAZOLE 150 MG PO TABS
150.0000 mg | ORAL_TABLET | Freq: Once | ORAL | 0 refills | Status: AC
Start: 2023-04-29 — End: 2023-04-29

## 2023-04-29 NOTE — Progress Notes (Signed)
    SUBJECTIVE:   CHIEF COMPLAINT / HPI:   Elizabeth Warren is a 43 y.o. female  presenting for UTI symptoms.   UTI:  Ongoing for 2 days. Fever present due to concurrent UTI. Urgency present, little pain over her bladder. Feels like her prior UTIs.  Back pain present in lower back but has been present  Has had UTIs before - reports urine test looked normal, but when she was given a one-time pill her symptoms completely resolved and have not bothered her in 3 years.  He does not want antibiotics due to history of C. difficile  PERTINENT  PMH / PSH: Reviewed and updated   OBJECTIVE:   BP 131/89   Pulse (!) 106   Wt 201 lb 12.8 oz (91.5 kg)   SpO2 100%   BMI 35.75 kg/m   Well-appearing, no acute distress Cardio: Regular rate, regular rhythm, no murmurs on exam. Pulm: Clear, no wheezing, no crackles. No increased work of breathing Abdominal: bowel sounds present, soft, non-tender, non-distended Extremities: no peripheral edema   ASSESSMENT/PLAN:   Frequent urination: UA collected and negative for infection.  Shared decision making with patient to not prescribe antibiotics even though she clinically meets criteria for UTI.  She would like to have the one-time pill represcribed.  On chart review this appears to be Diflucan.  1 dose of Diflucan sent to pharmacy.  Discussed return precautions with patient if she begins to have worsening symptoms of UTI.  Glendale Chard, DO [facility]

## 2023-04-29 NOTE — Patient Instructions (Addendum)
 It was great to see you today!   I am sending you a medication called diflucan.   Future Appointments  Date Time Provider Department Center  04/29/2023  3:50 PM Glendale Chard, DO [facility]    Please arrive 15 minutes before your appointment to ensure smooth check in process.    Please call the clinic at [phone] if your symptoms worsen or you have any concerns.  Thank you for allowing me to participate in your care, Dr. Glendale Chard [facility]

## 2023-05-14 ENCOUNTER — Telehealth: Payer: Self-pay

## 2023-05-14 NOTE — Telephone Encounter (Signed)
 Patient calls nurse line regarding issues with picking up Loperamide prescription and glucose testing strips.   Called pharmacy. Pharmacy tech was able to start process for refills and advised that they should be ready for pick up after lunch today.   Called patient and provided with update. Patient appreciative.   Veronda Prude, RN

## 2023-05-15 ENCOUNTER — Encounter (HOSPITAL_COMMUNITY): Payer: Self-pay

## 2023-05-15 ENCOUNTER — Ambulatory Visit (HOSPITAL_COMMUNITY)
Admission: EM | Admit: 2023-05-15 | Discharge: 2023-05-15 | Disposition: A | Discharge status: Home / Self Care | Attending: Emergency Medicine | Admitting: Emergency Medicine

## 2023-05-15 DIAGNOSIS — R197 Diarrhea, unspecified: Secondary | ICD-10-CM | POA: Insufficient documentation

## 2023-05-15 LAB — C DIFFICILE QUICK SCREEN W PCR REFLEX
C Diff antigen: NEGATIVE
C Diff interpretation: NOT DETECTED
C Diff toxin: NEGATIVE

## 2023-05-15 MED ORDER — DIPHENOXYLATE-ATROPINE 2.5-0.025 MG PO TABS: 1.0000 | ORAL_TABLET | Freq: Four times a day (QID) | ORAL | 0 refills | Status: DC | PRN

## 2023-05-15 NOTE — ED Provider Notes (Signed)
 [facility]    CSN: [id number] Arrival date & time: 05/15/23  1912      History   Chief Complaint Chief Complaint  Patient presents with   Diarrhea    HPI Elizabeth Warren is a 43 y.o. female.   Patient presents with abdominal cramping and heavy diarrhea x 2 days.  Patient states she has been taking Imodium without relief.  Patient reports intermittent diarrhea since having her gallbladder removed 3 years ago.  Patient states that her symptoms are similar to when she has had C. difficile in the past.  Denies known fever, nausea, vomiting, persistent abdominal pain, and blood in stool.   Diarrhea   Past Medical History:  Diagnosis Date   Acute cholecystitis 01/18/2020   Gestational diabetes    History of gestational diabetes 09/06/2016   Hypertension    with last pregnancy 2019   NVD (normal vaginal delivery) 09/20/2010   Pre-existing type 2 diabetes mellitus during pregnancy in second trimester 10/02/2016   S/P cholecystectomy 01/17/2021    Patient Active Problem List   Diagnosis Date Noted   IUD (intrauterine device) in place 07/15/2022   S/P cholecystectomy 01/17/2021   Irritable bowel syndrome with diarrhea 02/25/2020   Low TSH level 09/03/2019   Type 2 diabetes mellitus with hyperglycemia (HCC) 09/01/2019    Past Surgical History:  Procedure Laterality Date   CHOLECYSTECTOMY N/A 01/19/2020   Procedure: LAPAROSCOPIC CHOLECYSTECTOMY;  Surgeon: Almond Lint, MD;  Location: [facility];  Service: General;  Laterality: N/A;    OB History     Gravida  6   Para  6   Term  6   Preterm  0   AB  0   Living  6      SAB  0   IAB  0   Ectopic  0   Multiple  0   Live Births  6            Home Medications    Prior to Admission medications   Medication Sig Start Date End Date Taking? Authorizing Provider  diphenoxylate-atropine (LOMOTIL) 2.5-0.025 MG tablet Take 1 tablet by mouth 4 (four) times daily as needed for diarrhea or loose  stools. 05/15/23  Yes Wynonia Lawman A, NP  Accu-Chek Softclix Lancets lancets Use as instructed 03/21/23   Bess Kinds, MD  acetaminophen (TYLENOL) 325 MG tablet Take 2 tablets (650 mg total) by mouth every 4 (four) hours as needed (for pain scale < 4). Patient not taking: Reported on 07/15/2022 06/02/22   Donette Larry, CNM  blood glucose meter kit and supplies KIT Dispense based on patient and insurance preference. Use up to four times daily as directed. Patient not taking: Reported on 05/24/2022 12/07/21   Constant, Peggy, MD  Blood Glucose Monitoring Suppl (ACCU-CHEK GUIDE ME) w/Device KIT 1 Units by Does not apply route every other day. Patient not taking: Reported on 05/24/2022 09/03/19   Celedonio Savage, MD  Blood Pressure Monitoring (BLOOD PRESSURE KIT) DEVI 1 kit by Does not apply route once a week. Patient not taking: Reported on 05/24/2022 10/29/21   Constant, Peggy, MD  dicyclomine (BENTYL) 20 MG tablet Take 1 tablet (20 mg total) by mouth 3 (three) times daily as needed for spasms. 03/21/23   Bess Kinds, MD  FEROSUL 325 (65 Fe) MG tablet TAKE 1 TABLET BY MOUTH EVERY OTHER DAY 11/19/22   Hermina Staggers, MD  glucose blood (ACCU-CHEK GUIDE TEST) test strip Check fasting glucose daily and check  glucose once after a meal daily 04/02/23   Erick Alley, DO  glyBURIDE (DIABETA) 2.5 MG tablet Take 1 tablet (2.5 mg total) by mouth daily with breakfast. Patient not taking: Reported on 07/15/2022 06/02/22   Mercado-Ortiz, Lahoma Crocker, DO  ibuprofen (ADVIL) 600 MG tablet Take 1 tablet (600 mg total) by mouth every 6 (six) hours. 06/02/22   Donette Larry, CNM  ibuprofen (ADVIL) 600 MG tablet Take 1 tablet (600 mg total) by mouth every 6 (six) hours as needed. 07/15/22   Sue Lush, FNP  INS SYRINGE/NEEDLE .5CC/27G 27G X 1/2" 0.5 ML MISC 1 Syringe by Does not apply route daily. Patient not taking: Reported on 05/24/2022 03/26/22   Lennart Pall, MD  insulin glargine (LANTUS) 100  UNIT/ML Solostar Pen Inject 20 Units into the skin every morning. 04/10/23   Erick Alley, DO  Lancets Misc. (ACCU-CHEK SOFTCLIX LANCET DEV) KIT 1 kit by Does not apply route daily. Patient not taking: Reported on 05/24/2022 11/06/21   Federico Flake, MD  oxyCODONE (OXY IR/ROXICODONE) 5 MG immediate release tablet Take 1 tablet (5 mg total) by mouth every 4 (four) hours as needed (pain scale 4-7). Patient not taking: Reported on 07/15/2022 06/02/22   Donette Larry, CNM  pantoprazole (PROTONIX) 40 MG tablet Take 1 tablet (40 mg total) by mouth daily. Patient not taking: Reported on 07/15/2022 04/08/22   Constant, Peggy, MD  Prenatal Vit-Fe Fumarate-FA (WESTAB PLUS) 27-1 MG TABS Take 1 tablet by mouth daily. 06/05/22   Bess Kinds, MD    Family History Family History  Problem Relation Age of Onset   Diabetes Mother    Hypertension Mother    Diabetes Father    Hypertension Father    Diabetes Maternal Grandmother    Hypertension Maternal Grandmother    Diabetes Maternal Grandfather    Hypertension Maternal Grandfather    Diabetes Paternal Grandmother    Hypertension Paternal Grandmother    Diabetes Paternal Grandfather    Hypertension Paternal Grandfather    Cancer Neg Hx    Heart disease Neg Hx    Stroke Neg Hx     Social History Social History   Tobacco Use   Smoking status: Never   Smokeless tobacco: Never  Vaping Use   Vaping status: Never Used  Substance Use Topics   Alcohol use: No   Drug use: No     Allergies   Patient has no known allergies.   Review of Systems Review of Systems  Gastrointestinal:  Positive for diarrhea.   Per HPI  Physical Exam Triage Vital Signs ED Triage Vitals  Encounter Vitals Group     BP 05/15/23 1956 135/88     Systolic BP Percentile --      Diastolic BP Percentile --      Pulse Rate 05/15/23 1956 (!) 111     Resp 05/15/23 1956 18     Temp 05/15/23 1956 99.2 F (37.3 C)     Temp Source 05/15/23 1956 Oral     SpO2  05/15/23 1956 97 %     Weight --      Height --      Head Circumference --      Peak Flow --      Pain Score 05/15/23 1958 0     Pain Loc --      Pain Education --      Exclude from Growth Chart --    No data found.  Updated Vital Signs BP 135/88 (BP  Location: Left Arm)   Pulse (!) 111   Temp 99.2 F (37.3 C) (Oral)   Resp 18   SpO2 97%   Breastfeeding No   Visual Acuity Right Eye Distance:   Left Eye Distance:   Bilateral Distance:    Right Eye Near:   Left Eye Near:    Bilateral Near:     Physical Exam Vitals and nursing note reviewed.  Constitutional:      General: She is awake. She is not in acute distress.    Appearance: Normal appearance. She is well-developed and well-groomed. She is not ill-appearing.  Abdominal:     General: Abdomen is protuberant. Bowel sounds are normal.     Palpations: Abdomen is soft.     Tenderness: There is no abdominal tenderness.  Neurological:     Mental Status: She is alert.  Psychiatric:        Behavior: Behavior is cooperative.      UC Treatments / Results  Labs (all labs ordered are listed, but only abnormal results are displayed) Labs Reviewed  GASTROINTESTINAL PANEL BY PCR, STOOL (REPLACES STOOL CULTURE)  C DIFFICILE QUICK SCREEN W PCR REFLEX      EKG   Radiology No results found.  Procedures Procedures (including critical care time)  Medications Ordered in UC Medications - No data to display  Initial Impression / Assessment and Plan / UC Course  I have reviewed the triage vital signs and the nursing notes.  Pertinent labs & imaging results that were available during my care of the patient were reviewed by me and considered in my medical decision making (see chart for details).     Patient is well-appearing.  Vitals are stable.  Nontender upon palpation abdomen.  Ordered GI panel and C. difficile screening.  Prescribed Lomotil and recommended discontinuing Imodium.  Discussed following up with GI.   Discussed return and strict ER precautions Final Clinical Impressions(s) / UC Diagnoses   Final diagnoses:  Diarrhea, unspecified type     Discharge Instructions      We will send your stool sample to the main lab for evaluation.  If this comes back positive and requires any additional treatment we will call you.  Otherwise stop taking Imodium and start taking Lomotil 4 times daily as needed for diarrhea.  Follow-up with gastroenterology if symptoms persist.  Return here as needed.  If you develop uncontrollable diarrhea, severe abdominal pain, excessive vomiting, blood in your stool, or high fevers please seek immediate medical treatment in the emergency department.    ED Prescriptions     Medication Sig Dispense Auth. Provider   diphenoxylate-atropine (LOMOTIL) 2.5-0.025 MG tablet Take 1 tablet by mouth 4 (four) times daily as needed for diarrhea or loose stools. 30 tablet Wynonia Lawman A, NP      I have reviewed the PDMP during this encounter.   Wynonia Lawman A, NP 05/15/23 2019

## 2023-05-15 NOTE — Discharge Instructions (Signed)
 We will send your stool sample to the main lab for evaluation.  If this comes back positive and requires any additional treatment we will call you.  Otherwise stop taking Imodium and start taking Lomotil 4 times daily as needed for diarrhea.  Follow-up with gastroenterology if symptoms persist.  Return here as needed.  If you develop uncontrollable diarrhea, severe abdominal pain, excessive vomiting, blood in your stool, or high fevers please seek immediate medical treatment in the emergency department.

## 2023-05-15 NOTE — ED Triage Notes (Signed)
 Pt states had her gallbladder removed 3 yrs and takes imodium every day for for diarrhea. States abdominal cramping and heavy diarrhea with taking imodium x2 days. Same sx's with she had C diff last year.

## 2023-05-16 ENCOUNTER — Telehealth (HOSPITAL_COMMUNITY): Payer: Self-pay

## 2023-05-16 LAB — GASTROINTESTINAL PANEL BY PCR, STOOL (REPLACES STOOL CULTURE)
Adenovirus F40/41: NOT DETECTED
Astrovirus: NOT DETECTED
Campylobacter species: DETECTED — AB
Cryptosporidium: NOT DETECTED
Cyclospora cayetanensis: NOT DETECTED
E. coli O157: NOT DETECTED
Entamoeba histolytica: NOT DETECTED
Enteroaggregative E coli (EAEC): NOT DETECTED
Enterotoxigenic E coli (ETEC): NOT DETECTED
Giardia lamblia: NOT DETECTED
Norovirus GI/GII: NOT DETECTED
Plesimonas shigelloides: DETECTED — AB
Rotavirus A: NOT DETECTED
Salmonella species: NOT DETECTED
Sapovirus (I, II, IV, and V): NOT DETECTED
Shiga like toxin producing E coli (STEC): DETECTED — AB
Shigella/Enteroinvasive E coli (EIEC): NOT DETECTED
Vibrio cholerae: NOT DETECTED
Vibrio species: NOT DETECTED
Yersinia enterocolitica: NOT DETECTED

## 2023-05-16 NOTE — Telephone Encounter (Signed)
 Her stool pathogen panel shows 3 different bacteria-- Campylobacter, Plesimonas, and E coli. Mostly, they recommend against antibiotic treatment, as these infections are usually self limited. In other words, a person usually gets over these infections without having to take any antibiotic medicines.  Treatment is usually supportive and antidiarrhea medicine has been prescribed.  This would be about day 3 or 4 of her illness.  If she continues to have trouble with diarrhea that is frequent or big volumes of diarrhea in the next 3 or 4 days, then she should let us know and that may be time to do an antibiotic then.  She needs to make sure she is drinking plenty of fluids.

## 2023-05-16 NOTE — Telephone Encounter (Addendum)
 Microbiology called around 1036 to read out GI panel results. Attending Provider made aware of phone call.

## 2023-05-16 NOTE — Telephone Encounter (Signed)
 Patient informed of the results and advice below, verbalized understanding.

## 2023-05-19 ENCOUNTER — Other Ambulatory Visit: Payer: Self-pay

## 2023-05-19 MED ORDER — WESTAB PLUS 27-1 MG PO TABS: 1.0000 | ORAL_TABLET | Freq: Every day | ORAL | 2 refills | Status: DC

## 2023-05-20 ENCOUNTER — Ambulatory Visit: Admitting: Student

## 2023-05-20 VITALS — BP 120/91 | HR 102 | Ht 63.0 in | Wt 204.6 lb

## 2023-05-20 DIAGNOSIS — J302 Other seasonal allergic rhinitis: Secondary | ICD-10-CM | POA: Diagnosis not present

## 2023-05-20 DIAGNOSIS — A09 Infectious gastroenteritis and colitis, unspecified: Secondary | ICD-10-CM | POA: Diagnosis not present

## 2023-05-20 MED ORDER — CETIRIZINE HCL 10 MG PO TABS: 10.0000 mg | ORAL_TABLET | Freq: Every day | ORAL | 11 refills | Status: AC | PRN

## 2023-05-20 MED ORDER — ACCU-CHEK GUIDE ME W/DEVICE KIT: 1.0000 [IU] | PACK | 0 refills | Status: AC

## 2023-05-20 MED ORDER — AZITHROMYCIN 500 MG PO TABS: 500.0000 mg | ORAL_TABLET | Freq: Every day | ORAL | 0 refills | Status: AC

## 2023-05-20 MED ORDER — ACCU-CHEK GUIDE TEST VI STRP: ORAL_STRIP | 12 refills | Status: DC

## 2023-05-20 MED ORDER — OLOPATADINE HCL 0.1 % OP SOLN: 2.0000 [drp] | Freq: Two times a day (BID) | OPHTHALMIC | 12 refills | Status: AC

## 2023-05-20 MED ORDER — FLUTICASONE PROPIONATE 50 MCG/ACT NA SUSP: 2.0000 | Freq: Every day | NASAL | 6 refills | Status: DC

## 2023-05-20 NOTE — Assessment & Plan Note (Addendum)
 Patient appreciates itchy nose/runny nose/itchy eyes/watery eyes it is since pollen has been an issue.  Patient takes nothing for allergy medicine.  Patient is in normal state of health, no fevers or systemic symptoms, except for diarrhea being treated by other means.  Will recommend nasal spray and eyedrops for allergy medications. - Flonase 2 sprays each nostril daily - Olopatadine eyedrops twice daily - Zyrtec 10 mg daily

## 2023-05-20 NOTE — Assessment & Plan Note (Addendum)
 Acute bacterial gastroenteritis confirmed by stool culture. Positive for Campylobacter, STEC, and Plesimonas shigelloides. Having severe diarrhea and abdominal cramps with dehydration risk. Will treat w/ azithromycin.   - Prescribe azithromycin 500 mg daily for 5 days.  - Advise against loperamide use during antibiotic treatment.  - Instruct to monitor for dehydration or worsening symptoms - Advise to return if symptoms do not improve in 4-5 days.

## 2023-05-20 NOTE — Progress Notes (Signed)
  SUBJECTIVE:   CHIEF COMPLAINT / HPI:   Pt w/ hx of chronic diarrhea supposedly 2/2 cholecystectomy. Went to ED 4/10 and stool was positive for Campy, STEC, and Plesimonas shigelloides    Diarrhea She has been experiencing severe diarrhea and abdominal cramps since last Wednesday, persisting for several days. The symptoms occur regardless of food intake, and she has been using Imodium to manage the diarrhea, although it has not fully resolved the symptoms. She recalls eating medium-cooked sheep meat about two weeks ago, which she suspects might be the source of her symptoms. A recent test result indicated the presence of Campylobacter and another unspecified bacteria, leading to a diagnosis of a bacterial infection. She has been experiencing significant discomfort, including crying due to the cramps, and reports not sleeping well due to the symptoms.   Allergies She has allergy symptoms, including sneezing, itchy and watery eyes, and an itchy nose. She has a history of allergies and has been using over-the-counter medications for about ten years to manage these symptoms.   PERTINENT  PMH / PSH:   OBJECTIVE:  BP (!) 120/91   Pulse (!) 102   Ht 5\' 3"  (1.6 m)   Wt 204 lb 9.6 oz (92.8 kg)   SpO2 100%   BMI 36.24 kg/m  Physical Exam Constitutional:      General: She is not in acute distress.    Appearance: She is well-developed. She is not ill-appearing.  Pulmonary:     Effort: Pulmonary effort is normal.  Abdominal:     General: Bowel sounds are normal.     Palpations: Abdomen is soft. There is no shifting dullness, fluid wave, hepatomegaly, splenomegaly, mass or pulsatile mass.     Tenderness: There is no abdominal tenderness.  Neurological:     Mental Status: She is alert.      ASSESSMENT/PLAN:   Assessment & Plan Infectious diarrhea Acute bacterial gastroenteritis confirmed by stool culture. Positive for Campylobacter, STEC, and Plesimonas shigelloides. Having severe  diarrhea and abdominal cramps with dehydration risk. Will treat w/ azithromycin.   - Prescribe azithromycin 500 mg daily for 5 days.  - Advise against loperamide use during antibiotic treatment.  - Instruct to monitor for dehydration or worsening symptoms - Advise to return if symptoms do not improve in 4-5 days.  Seasonal allergies Patient appreciates itchy nose/runny nose/itchy eyes/watery eyes it is since pollen has been an issue.  Patient takes nothing for allergy medicine.  Patient is in normal state of health, no fevers or systemic symptoms, except for diarrhea being treated by other means.  Will recommend nasal spray and eyedrops for allergy medications. - Flonase 2 sprays each nostril daily - Olopatadine eyedrops twice daily - Zyrtec 10 mg daily No follow-ups on file. Wilhemena Harbour, MD 05/20/2023, 4:19 PM PGY-3, [facility]

## 2023-05-20 NOTE — Patient Instructions (Signed)
 It was great to see you! Thank you for allowing me to participate in your care!  I recommend that you always bring your medications to each appointment as this makes it easy to ensure we are on the correct medications and helps us  not miss when refills are needed.  Our plans for today:  - Infectious Diarrhea You have 3 bacteria in your gut that are causing your diarrhea. This will get better with the antibiotic I'm giving you.     Take Azithromycin 500 mg, daily, for 5 days  Follow up in clinic if symptoms not getting better after 4-5 days  Hold off of lomotil and imodium until completed antibiotics.  - Seasonal Allergies You likely have allergies. I'm giving you medicine to help.  Take Zyrtec daily, 10 mg Take Flonase daily, 2 sprays each nostril Use olopatadine eye drops daily, 2 drops twice a day   Take care and seek immediate care sooner if you develop any concerns.   Dr. Wilhemena Harbour, MD [facility]

## 2023-05-26 ENCOUNTER — Ambulatory Visit (INDEPENDENT_AMBULATORY_CARE_PROVIDER_SITE_OTHER): Admitting: Family Medicine

## 2023-05-26 ENCOUNTER — Encounter: Payer: Self-pay | Admitting: Family Medicine

## 2023-05-26 VITALS — BP 109/61 | HR 96 | Ht 63.0 in | Wt 201.4 lb

## 2023-05-26 DIAGNOSIS — K580 Irritable bowel syndrome with diarrhea: Secondary | ICD-10-CM

## 2023-05-26 MED ORDER — DICYCLOMINE HCL 20 MG PO TABS
20.0000 mg | ORAL_TABLET | Freq: Three times a day (TID) | ORAL | 0 refills | Status: DC | PRN
Start: 2023-05-26 — End: 2023-09-05

## 2023-05-26 NOTE — Progress Notes (Signed)
    SUBJECTIVE:   CHIEF COMPLAINT / HPI: Not feeling well  Went to ED for diarrhea on 05/15/23. GI panel positive for Campylobacter, plesimonas, and shiga like toxin E coli. Saw Dr. Tenna Fees on 04/15. Is still having diarrhea and cramps.  Finished Azithromycin . Stools are soft now, not liquid. Going 3-4 times per day. Imodium  is not helping. No vomiting.  Drinking normally. Has had IBS for 20 years. Reports she has had previously normal colonoscopy and EGD.  PERTINENT  PMH / PSH: IBS, T2DM  OBJECTIVE:   BP 109/61   Pulse 96   Ht 5\' 3"  (1.6 m)   Wt 201 lb 6.4 oz (91.4 kg)   SpO2 100%   BMI 35.68 kg/m   General: NAD, well appearing Neuro: A&O Respiratory: normal WOB on RA Abdomen: Soft, nontender, no rebound or guarding Extremities: Moving all 4 extremities equally   ASSESSMENT/PLAN:   Assessment & Plan Irritable bowel syndrome with diarrhea Suspect infectious diarrhea has resolved from several weeks ago.  She is likely continuing to have her regular diarrhea related to IBS.  Patient agreeable to continuing Imodium  and Bentyl  as needed.  Recommended she review her diet for triggering IBS symptoms.  Stated she would follow-up in 2 weeks if her diarrhea had not improved.  Offered repeat GI consult, however patient declined.  If continue to have diarrhea in 2 weeks would consider C. difficile PCR screen as she has history and has now been treated with antibiotics.  Return if symptoms worsen or fail to improve.  Ivin Marrow, MD [facility]   Some or all of this note was dictated use Dragon software, there may be unintentional errors present.

## 2023-05-26 NOTE — Patient Instructions (Addendum)
 It was great to see you! Thank you for allowing me to participate in your care!  Our plans for today:  - Please use Bentyl  as needed for her cramping. - You may also use Imodium  as needed to help with your diarrhea. - Please return if you get dehydrated or symptoms are not improving in 2 weeks.   Please arrive 15 minutes PRIOR to your next scheduled appointment time! If you do not, this affects OTHER patients' care.  Take care and seek immediate care sooner if you develop any concerns.   Ivin Marrow, MD, PGY-2 [facility] 2:46 PM 05/26/2023  [facility]

## 2023-05-26 NOTE — Assessment & Plan Note (Signed)
 Suspect infectious diarrhea has resolved from several weeks ago.  She is likely continuing to have her regular diarrhea related to IBS.  Patient agreeable to continuing Imodium  and Bentyl  as needed.  Recommended she review her diet for triggering IBS symptoms.  Stated she would follow-up in 2 weeks if her diarrhea had not improved.  Offered repeat GI consult, however patient declined.  If continue to have diarrhea in 2 weeks would consider C. difficile PCR screen as she has history and has now been treated with antibiotics.

## 2023-05-30 ENCOUNTER — Ambulatory Visit

## 2023-05-30 VITALS — BP 92/63 | HR 99 | Ht 63.0 in | Wt 203.0 lb

## 2023-05-30 DIAGNOSIS — A09 Infectious gastroenteritis and colitis, unspecified: Secondary | ICD-10-CM | POA: Diagnosis not present

## 2023-05-30 DIAGNOSIS — E1165 Type 2 diabetes mellitus with hyperglycemia: Secondary | ICD-10-CM

## 2023-05-30 DIAGNOSIS — Z794 Long term (current) use of insulin: Secondary | ICD-10-CM

## 2023-05-30 MED ORDER — AZITHROMYCIN 500 MG PO TABS: 500.0000 mg | ORAL_TABLET | Freq: Every day | ORAL | 0 refills | Status: AC

## 2023-05-30 MED ORDER — INSULIN GLARGINE 100 UNIT/ML SOLOSTAR PEN
20.0000 [IU] | PEN_INJECTOR | SUBCUTANEOUS | 1 refills | Status: DC
Start: 2023-05-30 — End: 2023-12-16

## 2023-05-30 MED ORDER — CIPROFLOXACIN HCL 500 MG PO TABS: 500.0000 mg | ORAL_TABLET | Freq: Two times a day (BID) | ORAL | 0 refills | Status: AC

## 2023-05-30 MED ORDER — ACCU-CHEK GUIDE TEST VI STRP: ORAL_STRIP | 12 refills | Status: DC

## 2023-05-30 NOTE — Patient Instructions (Signed)
 It was great to see you! Thank you for allowing me to participate in your care!  I recommend that you always bring your medications to each appointment as this makes it easy to ensure we are on the correct medications and helps us  not miss when refills are needed.  Our plans for today:  - Infectious Diarrhea  We will try two antibiotics to treat your infection  Take Azithromycin  500 mg daily x 5 days  Take Ciprofloxacin  500 mg twice a day x 5 days If this doesn't improve your symptoms, we may have to send you to an Infectious Disease Specialist.    Take care and seek immediate care sooner if you develop any concerns.   Dr. Wilhemena Harbour, MD [facility]

## 2023-05-30 NOTE — Progress Notes (Signed)
  SUBJECTIVE:   CHIEF COMPLAINT / HPI:   Stomach Issues -Seen on 4/15 for infectious diarrhea w/ 3 pathogen > Azith  Patient has continued to have diarrhea and cramping that is significantly different from her usual IBS diarrhea. She note's she took the full treatment of abx, but still has symptoms. No fevers, but abdominal pain/cramping/gas when having diarrhea.    PERTINENT  PMH / PSH:   OBJECTIVE:  BP 92/63   Pulse 99   Ht 5\' 3"  (1.6 m)   Wt 203 lb (92.1 kg)   SpO2 98%   BMI 35.96 kg/m  Physical Exam Constitutional:      General: She is not in acute distress.    Appearance: She is well-developed. She is not ill-appearing.  Abdominal:     General: There is no distension. There are no signs of injury.     Palpations: Abdomen is soft. There is no shifting dullness, fluid wave, hepatomegaly, splenomegaly, mass or pulsatile mass.     Tenderness: There is no abdominal tenderness.  Neurological:     Mental Status: She is alert.      ASSESSMENT/PLAN:   Assessment & Plan Infectious diarrhea Patient comes in for follow-up of infectious diarrhea.  Patient notes that she continues to have significant diarrhea with cramping and bloating, that is different from her usual IBS diarrhea.  Patient has tried full course of azithromycin  without success.  Suspect patient still needing to be treated for 3 bugs she had in her GI system.  Will treat with azithromycin  and ciprofloxacin .  Plan for follow-up in 1 week if not improved.  If not improved after second course of treatment will refer to infectious disease versus GI. - Azithromycin  500 mg daily x 5 days - Ciprofloxacin  500 mg twice daily x 5 days - Follow up 1 week if not improved, consider ID referral No follow-ups on file. Wilhemena Harbour, MD 05/30/2023, 10:22 AM PGY-3, [facility]

## 2023-05-30 NOTE — Assessment & Plan Note (Addendum)
 Patient comes in for follow-up of infectious diarrhea.  Patient notes that she continues to have significant diarrhea with cramping and bloating, that is different from her usual IBS diarrhea.  Patient has tried full course of azithromycin  without success.  Suspect patient still needing to be treated for 3 bugs she had in her GI system.  Will treat with azithromycin  and ciprofloxacin .  Plan for follow-up in 1 week if not improved.  If not improved after second course of treatment will refer to infectious disease versus GI. - Azithromycin  500 mg daily x 5 days - Ciprofloxacin  500 mg twice daily x 5 days - Follow up 1 week if not improved, consider ID referral

## 2023-07-15 ENCOUNTER — Encounter: Payer: Self-pay | Admitting: *Deleted

## 2023-08-28 ENCOUNTER — Ambulatory Visit (INDEPENDENT_AMBULATORY_CARE_PROVIDER_SITE_OTHER): Admitting: Family Medicine

## 2023-08-28 VITALS — BP 100/70 | HR 70 | Wt 195.4 lb

## 2023-08-28 DIAGNOSIS — N6452 Nipple discharge: Secondary | ICD-10-CM | POA: Diagnosis not present

## 2023-08-28 DIAGNOSIS — K580 Irritable bowel syndrome with diarrhea: Secondary | ICD-10-CM

## 2023-08-28 DIAGNOSIS — R1013 Epigastric pain: Secondary | ICD-10-CM | POA: Diagnosis not present

## 2023-08-28 MED ORDER — FAMOTIDINE 20 MG PO TABS: 20.0000 mg | ORAL_TABLET | Freq: Every day | ORAL | 0 refills | Status: DC

## 2023-08-28 MED ORDER — PANTOPRAZOLE SODIUM 20 MG PO TBEC
20.0000 mg | DELAYED_RELEASE_TABLET | Freq: Every day | ORAL | 0 refills | Status: DC
Start: 2023-08-28 — End: 2023-09-05

## 2023-08-28 NOTE — Assessment & Plan Note (Signed)
 Reassuringly without TTP on exam or significant weight loss. In the context of recent onset with sensation of fullness, reflux could be the cause. Will test for H. Pylori given positive antibodies in the past without clear documentation of treatment. Recommended protonix  with famotidine  daily in the meantime. She may need GI follow up given her extensive GI history.

## 2023-08-28 NOTE — Assessment & Plan Note (Addendum)
 Thin white discharge, in the context of oral stimulation with lack of pain/masses, likely benign. However, given her age, strongly suggest obtaining mammogram; patient is hesitant but states she will go. US  ordered if mammogram unsatisfactory for evaluation. Fen[place]reek could also be contributing to discharge.

## 2023-08-28 NOTE — Progress Notes (Signed)
    SUBJECTIVE:   CHIEF COMPLAINT / HPI:   Abdominal pain Has history of this for 3 years. She also has a history of infectious diarrhea back in April 2025. Had her gallbladder taken out. Worsens diarrhea with fatty foods and spicy foods. She has had colonoscopy, CT scan, labs which have all been normal. She was given imodium  and dicyclomine  to help with IBS symptoms. Now she has had upper abdominal pain and sensation of fullness over the last 3 days. She has vomited in the past if she eats too much. She is not taking pantoprazole  anymore. No blood in her stool or vomitus.  Breast discharge Husband was orally stimulating her R nipple and had some mild discharge last week. No pain. No lumps that she has felt. She is no longer breastfeeding. She does take Fenugreek, however.  PERTINENT  PMH / PSH: IUD in place  OBJECTIVE:   BP 100/70   Pulse 70   Wt 195 lb 6.4 oz (88.6 kg)   SpO2 100%   BMI 34.61 kg/m   General: Alert and oriented, in NAD Skin: Warm, dry, and intact HEENT: NCAT, EOM grossly normal, midline nasal septum Respiratory/Chest: Breathing and speaking comfortably on RA, chaperoned by Rico CMA: bilateral breasts enlarged though without discrete masses/lumps, no TTP, mild amount of milky white discharge expressible from R nipple, no discharge expressed from L Abdominal: Soft, nontender, nondistended, normoactive bowel sounds Extremities: Moves all extremities grossly equally Neurological: No gross focal deficit Psychiatric: Markedly anxious mood, appropriate affect  ASSESSMENT/PLAN:   Assessment & Plan Breast discharge Thin white discharge, in the context of oral stimulation with lack of pain/masses, likely benign. However, given her age, strongly suggest obtaining mammogram; patient is hesitant but states she will go. US  ordered if mammogram unsatisfactory for evaluation. Fenugreek could also be contributing to discharge. Epigastric pain Reassuringly without TTP on exam  or significant weight loss. In the context of recent onset with sensation of fullness, reflux could be the cause. Will test for H. Pylori given positive antibodies in the past without clear documentation of treatment. Recommended protonix  with famotidine  daily in the meantime. She may need GI follow up given her extensive GI history. Irritable bowel syndrome with diarrhea Consider cholestyramine  for control of diarrhea s/p cholecystectomy if continues to be bothersome. Could also consider retesting GIPP given hx of infectious diarrhea in the past.   Stuart Redo, MD [facility]

## 2023-08-28 NOTE — Assessment & Plan Note (Addendum)
 Consider cholestyramine  for control of diarrhea s/p cholecystectomy if continues to be bothersome. Could also consider retesting GIPP given hx of infectious diarrhea in the past.

## 2023-08-28 NOTE — Patient Instructions (Signed)
 I have sent in famotidine  and protonix  to take every day.  We collected a lab test for a bacteria in the stomach.  We also ordered a mammogram to evaluate your breast discharge, though this could be normal due to stimulation from your husband.

## 2023-08-30 LAB — H. PYLORI BREATH TEST: H pylori Breath Test: POSITIVE — AB

## 2023-09-01 ENCOUNTER — Ambulatory Visit: Payer: Self-pay | Admitting: Family Medicine

## 2023-09-01 ENCOUNTER — Other Ambulatory Visit (HOSPITAL_COMMUNITY): Payer: Self-pay

## 2023-09-01 MED ORDER — BISMUTH/METRONIDAZ/TETRACYCLIN 140-125-125 MG PO CAPS: 3.0000 | ORAL_CAPSULE | Freq: Four times a day (QID) | ORAL | 0 refills | Status: DC

## 2023-09-01 NOTE — Progress Notes (Signed)
 Noted positive H. Pylori test. Could explain upper GI symptoms. Will send in Pylera  course. She knows to take it 4 times per day for 14 days. Patient instructed to come in 2 weeks after finishing abx and being off PPIs to retest. She agrees.  Patient also noted that her symptoms of breast discharge have resolved. She will not be attending her mammogram appointment for further evaluation, despite my recommendations to follow through with the scan. She will keep us  updated if symptoms recur.

## 2023-09-02 ENCOUNTER — Encounter

## 2023-09-02 ENCOUNTER — Other Ambulatory Visit (HOSPITAL_COMMUNITY): Payer: Self-pay

## 2023-09-02 ENCOUNTER — Telehealth: Payer: Self-pay

## 2023-09-02 ENCOUNTER — Inpatient Hospital Stay: Admission: RE | Admit: 2023-09-02 | Source: Ambulatory Visit

## 2023-09-02 MED ORDER — BISMUTH/METRONIDAZ/TETRACYCLIN 140-125-125 MG PO CAPS
3.0000 | ORAL_CAPSULE | Freq: Four times a day (QID) | ORAL | 0 refills | Status: AC
  Filled 2023-09-02: qty 120, 10d supply, fill #0

## 2023-09-02 NOTE — Telephone Encounter (Signed)
 Called [facility] this AM. Pharmacist reports that package comes in 10 day quantity and they are unable to break package.   Received verbal approval from Dr. Tharon to proceed with 10 day dosing package.   Elizabeth JAYSON English, RN

## 2023-09-02 NOTE — Telephone Encounter (Signed)
 Received the following communication from pharmacist at [facility] pharmacy.   See below.   Walterine Lacks! the pylera  is a #120 package size and normally dosed 3 capsules orally 4 times daily for 10 days, in combination with omeprazole 20 mg orally twice daily.  Forwarding to prescriber.   Lacks JAYSON English, RN

## 2023-09-02 NOTE — Telephone Encounter (Signed)
 Patient presents to clinic regarding Pylera  prescription.   She reports that medication is out of stock at [facility]. Requests that we send to [facility].   Called and cancelled at [facility], resent to [facility].   Chiquita JAYSON English, RN

## 2023-09-05 ENCOUNTER — Ambulatory Visit (HOSPITAL_COMMUNITY)
Admission: EM | Admit: 2023-09-05 | Discharge: 2023-09-05 | Disposition: A | Discharge status: Home / Self Care | Attending: Family Medicine | Admitting: Family Medicine

## 2023-09-05 ENCOUNTER — Encounter (HOSPITAL_COMMUNITY): Payer: Self-pay

## 2023-09-05 DIAGNOSIS — Z975 Presence of (intrauterine) contraceptive device: Secondary | ICD-10-CM

## 2023-09-05 DIAGNOSIS — Z008 Encounter for other general examination: Secondary | ICD-10-CM

## 2023-09-05 NOTE — ED Provider Notes (Signed)
 [facility]    CSN: [id number] Arrival date & time: 09/05/23  1242      History   Chief Complaint Chief Complaint  Patient presents with   Fall    HPI Elizabeth Warren is a 43 y.o. female.    Fall  Here for concern that her IUD may have come out.  On July 27, she slipped in her kitchen and fell onto her buttocks.  She has a large bruise on her buttock but it is not really hurting her now.  She has had an IUD fallout and she got pregnant, several years ago.  She would like me to check and make sure that we can see her IUD string.  No abdominal pain.  NKDA  Last menstrual cycle was July 26.  Past Medical History:  Diagnosis Date   Acute cholecystitis 01/18/2020   Gestational diabetes    History of gestational diabetes 09/06/2016   Hypertension    with last pregnancy 2019   NVD (normal vaginal delivery) 09/20/2010   Pre-existing type 2 diabetes mellitus during pregnancy in second trimester 10/02/2016   S/P cholecystectomy 01/17/2021    Patient Active Problem List   Diagnosis Date Noted   Breast discharge 08/28/2023   Epigastric pain 08/28/2023   Infectious diarrhea 05/20/2023   Seasonal allergies 05/20/2023   IUD (intrauterine device) in place 07/15/2022   S/P cholecystectomy 01/17/2021   Irritable bowel syndrome with diarrhea 02/25/2020   Low TSH level 09/03/2019   Type 2 diabetes mellitus with hyperglycemia (HCC) 09/01/2019    Past Surgical History:  Procedure Laterality Date   CHOLECYSTECTOMY N/A 01/19/2020   Procedure: LAPAROSCOPIC CHOLECYSTECTOMY;  Surgeon: Aron Shoulders, MD;  Location: [facility];  Service: General;  Laterality: N/A;    OB History     Gravida  6   Para  6   Term  6   Preterm  0   AB  0   Living  6      SAB  0   IAB  0   Ectopic  0   Multiple  0   Live Births  6            Home Medications    Prior to Admission medications   Medication Sig Start Date End Date Taking? Authorizing Provider   loperamide  (IMODIUM ) 2 MG capsule Take 2 mg by mouth as needed for diarrhea or loose stools. 09/01/23  Yes [provider]  Accu-Chek Softclix Lancets lancets Use as instructed 03/21/23   Sowell, Brandon, MD  Bismuth /Metronidaz/Tetracyclin (PYLERA ) [phone] MG CAPS Take 3 capsules by mouth in the morning, at noon, in the evening, and at bedtime for 10 days. 09/02/23 09/12/23  Tharon Lung, MD  blood glucose meter kit and supplies KIT Dispense based on patient and insurance preference. Use up to four times daily as directed. Patient not taking: Reported on 05/24/2022 12/07/21   Constant, Peggy, MD  Blood Glucose Monitoring Suppl (ACCU-CHEK GUIDE ME) w/Device KIT 1 Units by Does not apply route every other day. 05/20/23   Jennelle Riis, MD  Blood Pressure Monitoring (BLOOD PRESSURE KIT) DEVI 1 kit by Does not apply route once a week. Patient not taking: Reported on 05/24/2022 10/29/21   Constant, Peggy, MD  cetirizine  (ZYRTEC ) 10 MG tablet Take 1 tablet (10 mg total) by mouth daily as needed for allergies. 05/20/23   Jennelle Riis, MD  FEROSUL 325 (65 Fe) MG tablet TAKE 1 TABLET BY MOUTH EVERY OTHER DAY 11/19/22  Ervin, Michael L, MD  glucose blood (ACCU-CHEK GUIDE TEST) test strip Check fasting glucose daily and check glucose once after a meal daily 05/30/23   Jennelle Riis, MD  INS SYRINGE/NEEDLE .5CC/27G 27G X 1/2 0.5 ML MISC 1 Syringe by Does not apply route daily. Patient not taking: Reported on 05/24/2022 03/26/22   Erik Kieth BROCKS, MD  insulin  glargine (LANTUS ) 100 UNIT/ML Solostar Pen Inject 20 Units into the skin every morning. 05/30/23   Jennelle Riis, MD  Lancets Misc. (ACCU-CHEK SOFTCLIX LANCET DEV) KIT 1 kit by Does not apply route daily. Patient not taking: Reported on 05/24/2022 11/06/21   Eldonna Suzen Octave, MD  olopatadine  (PATANOL) 0.1 % ophthalmic solution Place 2 drops into both eyes 2 (two) times daily. 05/20/23   Sowell, Brandon, MD  Prenatal Vit-Fe Fumarate-FA  (WESTAB PLUS) 27-1 MG TABS Take 1 tablet by mouth daily. 05/19/23   Herchel Gloris LABOR, MD    Family History Family History  Problem Relation Age of Onset   Diabetes Mother    Hypertension Mother    Diabetes Father    Hypertension Father    Diabetes Maternal Grandmother    Hypertension Maternal Grandmother    Diabetes Maternal Grandfather    Hypertension Maternal Grandfather    Diabetes Paternal Grandmother    Hypertension Paternal Grandmother    Diabetes Paternal Grandfather    Hypertension Paternal Grandfather    Cancer Neg Hx    Heart disease Neg Hx    Stroke Neg Hx     Social History Social History   Tobacco Use   Smoking status: Never   Smokeless tobacco: Never  Vaping Use   Vaping status: Never Used  Substance Use Topics   Alcohol use: No   Drug use: No     Allergies   Patient has no known allergies.   Review of Systems Review of Systems   Physical Exam Triage Vital Signs ED Triage Vitals  Encounter Vitals Group     BP 09/05/23 1341 117/82     Girls Systolic BP Percentile --      Girls Diastolic BP Percentile --      Boys Systolic BP Percentile --      Boys Diastolic BP Percentile --      Pulse Rate 09/05/23 1341 96     Resp 09/05/23 1341 16     Temp 09/05/23 1341 98.3 F (36.8 C)     Temp Source 09/05/23 1341 Oral     SpO2 09/05/23 1341 95 %     Weight --      Height --      Head Circumference --      Peak Flow --      Pain Score 09/05/23 1333 0     Pain Loc --      Pain Education --      Exclude from Growth Chart --    No data found.  Updated Vital Signs BP 117/82 (BP Location: Left Arm)   Pulse 96   Temp 98.3 F (36.8 C) (Oral)   Resp 16   LMP 08/30/2023 (Approximate)   SpO2 95%   Breastfeeding No   Visual Acuity Right Eye Distance:   Left Eye Distance:   Bilateral Distance:    Right Eye Near:   Left Eye Near:    Bilateral Near:     Physical Exam Vitals reviewed.  Constitutional:      General: She is not in acute  distress.    Appearance: She is  not toxic-appearing.  Abdominal:     Palpations: Abdomen is soft.  Genitourinary:    Comments: Chaperone is present during the time of exam.  There is a little bit of menstrual blood in the vagina.  Cervix is normal-appearing and I do see an IUD string.  I do not see any other apparatus in her vaginal area. Skin:    Coloration: Skin is not jaundiced or pale.  Neurological:     Mental Status: She is alert.      UC Treatments / Results  Labs (all labs ordered are listed, but only abnormal results are displayed) Labs Reviewed - No data to display  EKG   Radiology No results found.  Procedures Procedures (including critical care time)  Medications Ordered in UC Medications - No data to display  Initial Impression / Assessment and Plan / UC Course  I have reviewed the triage vital signs and the nursing notes.  Pertinent labs & imaging results that were available during my care of the patient were reviewed by me and considered in my medical decision making (see chart for details).     I think your IUD is still in place.  I have still asked her to please follow-up with her OB/GYN to be reexamined  Final Clinical Impressions(s) / UC Diagnoses   Final diagnoses:  IUD (intrauterine device) in place     Discharge Instructions      It appears that your IUD is in place and I can see the string.  I still think it would be good to see your OB/GYN provider soon, so they can recheck and make sure that the IUD is in its correct place.     ED Prescriptions   None    PDMP not reviewed this encounter.   Vonna Sharlet POUR, MD 09/05/23 [phone]

## 2023-09-05 NOTE — Discharge Instructions (Signed)
 It appears that your IUD is in place and I can see the string.  I still think it would be good to see your OB/GYN provider soon, so they can recheck and make sure that the IUD is in its correct place.

## 2023-09-05 NOTE — ED Triage Notes (Signed)
 Patient here today with concerns that her IUD may have moved after falling on Sunday. Patient states that she slipped on the floor and her legs split open and she fell onto her bottom. Patient has a large bruise on her buttock but denies pain. Patient states that in the past her IDU moved and she got pregnant.

## 2023-09-08 ENCOUNTER — Ambulatory Visit

## 2023-09-08 NOTE — Progress Notes (Deleted)
    SUBJECTIVE:   CHIEF COMPLAINT / HPI: fell on leg  ***  PERTINENT  PMH / PSH: IBS, T2DM  OBJECTIVE:   LMP 08/30/2023 (Approximate)   ***  ASSESSMENT/PLAN:   Assessment & Plan  No follow-ups on file.  Ozell Provencal, MD [facility]

## 2023-09-18 DIAGNOSIS — B3731 Acute candidiasis of vulva and vagina: Secondary | ICD-10-CM | POA: Diagnosis not present

## 2023-09-18 DIAGNOSIS — E1165 Type 2 diabetes mellitus with hyperglycemia: Secondary | ICD-10-CM | POA: Diagnosis not present

## 2023-11-18 ENCOUNTER — Other Ambulatory Visit: Payer: Self-pay

## 2023-11-18 MED ORDER — ACCU-CHEK GUIDE TEST VI STRP: ORAL_STRIP | 12 refills | Status: DC

## 2023-11-20 LAB — MISCELLANEOUS TEST

## 2023-12-01 ENCOUNTER — Other Ambulatory Visit: Payer: Self-pay

## 2023-12-08 NOTE — Telephone Encounter (Signed)
 Spoke with patient made appt for Nov 11th at 10:10. Nelson Land, CMA

## 2023-12-16 ENCOUNTER — Encounter: Payer: Self-pay | Admitting: Family Medicine

## 2023-12-16 ENCOUNTER — Ambulatory Visit (INDEPENDENT_AMBULATORY_CARE_PROVIDER_SITE_OTHER): Admitting: Family Medicine

## 2023-12-16 ENCOUNTER — Other Ambulatory Visit: Payer: Self-pay | Admitting: Family Medicine

## 2023-12-16 VITALS — BP 120/80 | HR 98 | Ht 63.0 in | Wt 195.4 lb

## 2023-12-16 DIAGNOSIS — E1165 Type 2 diabetes mellitus with hyperglycemia: Secondary | ICD-10-CM | POA: Diagnosis not present

## 2023-12-16 DIAGNOSIS — K580 Irritable bowel syndrome with diarrhea: Secondary | ICD-10-CM

## 2023-12-16 DIAGNOSIS — A048 Other specified bacterial intestinal infections: Secondary | ICD-10-CM

## 2023-12-16 LAB — POCT GLYCOSYLATED HEMOGLOBIN (HGB A1C): HbA1c, POC (controlled diabetic range): 9.9 % — AB (ref 0.0–7.0)

## 2023-12-16 MED ORDER — INSULIN GLARGINE 100 UNIT/ML SOLOSTAR PEN: 20.0000 [IU] | PEN_INJECTOR | SUBCUTANEOUS | 1 refills | Status: DC

## 2023-12-16 MED ORDER — ACCU-CHEK SOFTCLIX LANCET DEV KIT: 1.0000 | PACK | Freq: Every day | 0 refills | Status: AC

## 2023-12-16 MED ORDER — ACCU-CHEK GUIDE TEST VI STRP: ORAL_STRIP | 12 refills | Status: AC

## 2023-12-16 MED ORDER — LOPERAMIDE HCL 2 MG PO CAPS: 2.0000 mg | ORAL_CAPSULE | ORAL | 1 refills | Status: DC | PRN

## 2023-12-16 NOTE — Progress Notes (Signed)
    SUBJECTIVE:   CHIEF COMPLAINT / HPI: medication refill  Discussed the use of AI scribe software for clinical note transcription with the patient, who gave verbal consent to proceed.  History of Present Illness Elizabeth Warren is a 43 year old female with diabetes who presents for medication refills and diabetes management.  Glycemic control - Diabetes managed with Lantus  insulin , dosage adjusted based on morning blood glucose levels: 20 units for higher readings, 15 units for lower readings - Morning blood glucose levels range from 110 to 130 mg/dL - Requires refills for Lantus  insulin  and blood glucose monitoring strips - No lows - Does not want CGM - Does not to take another diabetes medicine  IBS, Hx of H pylori - Completed treatment for H. pylori infection - Not currently taking omeprazole or pantoprazole  - Requires refill for loperamide  2 mg - Denies use of Miralax or Florastor  Meds - Requires refill for prenatal vitamin - Denies taking cholestyramine , cholestipol, miralax or ducolax (med rec)   PERTINENT  PMH / PSH: T2DM, IBS, Hx of H pylori  OBJECTIVE:   BP 120/80   Pulse 98   Ht 5' 3 (1.6 m)   Wt 195 lb 6 oz (88.6 kg)   SpO2 99%   BMI 34.61 kg/m   Physical Exam General: NAD, well appearing Neuro: A&O Respiratory: normal WOB on RA Extremities: Moving all 4 extremities equally Feet: No deformities or skin breakdown bilaterally, intact to touch and monofilament testing bilaterally, posterior tibialis and dorsalis pedis pulses intact bilaterally   ASSESSMENT/PLAN:   Assessment & Plan Type 2 diabetes mellitus with hyperglycemia, unspecified whether long term insulin  use (HCC) A1c increased 9.9 from 8.6 today. Normal foot exam. - Increase Lantus  to 22 units daily. - Patient declined initiation of Jardiance or CGM. - Refill test strips and pen needles. - Extensively discussed not to adjust her insulin  lower unless her fasting blood sugars less than 80   - follow-up 3 months. - BMP, urine microalbumin today H. pylori infection Repeat H. pylori breath test today.  No current use of PPIs. Irritable bowel syndrome with diarrhea Refilled Imodium .    Return in about 3 months (around 03/17/2024) for A1c.  Ozell Provencal, MD, PGY-3 [facility] 9:28 AM 12/16/2023  [facility]

## 2023-12-16 NOTE — Patient Instructions (Addendum)
 It was great to see you! Thank you for allowing me to participate in your care!  Our plans for today:   VISIT SUMMARY: Today, we addressed your diabetes management, gastrointestinal symptoms, and nutritional supplementation. We also discussed your weight status and checked for peripheral neuropathy symptoms.  YOUR PLAN: TYPE 2 DIABETES MELLITUS WITH HYPERGLYCEMIA: Your diabetes is managed with Lantus  insulin , and your blood glucose levels are generally well-controlled. -Increase your Lantus  to 22 units daily - Your A1c increased to 9.9. -We have ordered yearly labs to monitor your kidney function and a urine test.  IRRITABLE BOWEL SYNDROME WITH DIARRHEA: You have irritable bowel syndrome with diarrhea, managed with Imodium  as needed. -Your Imodium  prescription has been refilled.  OTHER SPECIFIED BACTERIAL INTESTINAL INFECTIONS: You recently completed treatment for an H. pylori infection. -We have ordered an H. pylori test to confirm you have been cured.   Please arrive 15 minutes PRIOR to your next scheduled appointment time! If you do not, this affects OTHER patients' care.  Take care and seek immediate care sooner if you develop any concerns.   Ozell Provencal, MD, PGY-3 [facility] 9:30 AM 12/16/2023  [facility]

## 2023-12-16 NOTE — Assessment & Plan Note (Signed)
Refilled Imodium

## 2023-12-16 NOTE — Assessment & Plan Note (Addendum)
 A1c increased 9.9 from 8.6 today. Normal foot exam. - Increase Lantus  to 22 units daily. - Patient declined initiation of Jardiance or CGM. - Refill test strips and pen needles. - Extensively discussed not to adjust her insulin  lower unless her fasting blood sugars less than 80  - follow-up 3 months. - BMP, urine microalbumin today

## 2023-12-17 LAB — BASIC METABOLIC PANEL WITH GFR
BUN/Creatinine Ratio: 15 (ref 9–23)
BUN: 9 mg/dL (ref 6–24)
CO2: 18 mmol/L — ABNORMAL LOW (ref 20–29)
Calcium: 9.4 mg/dL (ref 8.7–10.2)
Chloride: 98 mmol/L (ref 96–106)
Creatinine, Ser: 0.6 mg/dL (ref 0.57–1.00)
Glucose: 166 mg/dL — ABNORMAL HIGH (ref 70–99)
Potassium: 4.4 mmol/L (ref 3.5–5.2)
Sodium: 135 mmol/L (ref 134–144)
eGFR: 116 mL/min/1.73 (ref >59)

## 2023-12-17 LAB — MICROALBUMIN / CREATININE URINE RATIO
Creatinine, Urine: 115.2 mg/dL
Microalb/Creat Ratio: 73 mg/g{creat} — ABNORMAL HIGH (ref 0–29)
Microalbumin, Urine: 83.9 ug/mL

## 2023-12-18 LAB — H. PYLORI BREATH TEST: H pylori Breath Test: NEGATIVE

## 2023-12-19 ENCOUNTER — Ambulatory Visit: Payer: Self-pay | Admitting: Family Medicine

## 2023-12-19 NOTE — Progress Notes (Signed)
 Mildly elevated UACR. Normal BMP. Negative H pylori retest.

## 2024-01-15 ENCOUNTER — Other Ambulatory Visit: Payer: Self-pay

## 2024-01-15 DIAGNOSIS — K580 Irritable bowel syndrome with diarrhea: Secondary | ICD-10-CM

## 2024-01-15 MED ORDER — LOPERAMIDE HCL 2 MG PO CAPS: 2.0000 mg | ORAL_CAPSULE | ORAL | 1 refills | Status: DC | PRN

## 2024-01-16 ENCOUNTER — Telehealth: Payer: Self-pay | Admitting: Family Medicine

## 2024-01-16 NOTE — Telephone Encounter (Signed)
 Called pharmacy regarding prescription.   Prescription needs to indicate a maximum daily dosage for insurance purposes.   Please write new prescription with this information. Thanks.   Chiquita JAYSON English, RN

## 2024-01-17 ENCOUNTER — Telehealth: Payer: Self-pay | Admitting: Family Medicine

## 2024-01-17 DIAGNOSIS — K580 Irritable bowel syndrome with diarrhea: Secondary | ICD-10-CM

## 2024-01-17 MED ORDER — LOPERAMIDE HCL 2 MG PO CAPS: 2.0000 mg | ORAL_CAPSULE | Freq: Every day | ORAL | 0 refills | Status: DC | PRN

## 2024-01-17 NOTE — Telephone Encounter (Signed)
 After Hours Call   Received after hours call. Pt reports that she needs refill sent to pharmacy for her chronic imodium . It was rpeviously sent but did not have administration instructions, I have resent it for once daily prn. Advised to call back on Monday for further assistance if needed.

## 2024-01-19 MED ORDER — LOPERAMIDE HCL 2 MG PO CAPS: 2.0000 mg | ORAL_CAPSULE | ORAL | 1 refills | Status: DC | PRN

## 2024-01-19 NOTE — Telephone Encounter (Signed)
Notified patient that medication was sent to the pharmacy

## 2024-01-19 NOTE — Addendum Note (Signed)
 Addended by: ALBA SHARPER on: 01/19/2024 10:13 AM   Modules accepted: Orders

## 2024-02-12 ENCOUNTER — Other Ambulatory Visit: Payer: Self-pay

## 2024-02-13 ENCOUNTER — Telehealth: Payer: Self-pay | Admitting: Family Medicine

## 2024-02-13 MED ORDER — DICYCLOMINE HCL 20 MG PO TABS: 20.0000 mg | ORAL_TABLET | Freq: Three times a day (TID) | ORAL | 0 refills | Status: AC

## 2024-02-13 NOTE — Telephone Encounter (Signed)
 1 month refill of bentyl  provided.

## 2024-02-24 ENCOUNTER — Other Ambulatory Visit: Payer: Self-pay

## 2024-02-24 DIAGNOSIS — E1165 Type 2 diabetes mellitus with hyperglycemia: Secondary | ICD-10-CM

## 2024-02-24 MED ORDER — INSULIN GLARGINE 100 UNIT/ML SOLOSTAR PEN: 20.0000 [IU] | PEN_INJECTOR | SUBCUTANEOUS | 1 refills | Status: AC

## 2024-02-27 ENCOUNTER — Telehealth: Payer: Self-pay | Admitting: Family Medicine

## 2024-02-27 ENCOUNTER — Other Ambulatory Visit: Payer: Self-pay

## 2024-02-27 DIAGNOSIS — K580 Irritable bowel syndrome with diarrhea: Secondary | ICD-10-CM

## 2024-02-27 NOTE — Telephone Encounter (Signed)
Sent request to PCP. Etter Royall, CMA  

## 2024-02-27 NOTE — Telephone Encounter (Signed)
 Patient called asking for refill on her Loperamide  and Westab please

## 2024-03-02 MED ORDER — LOPERAMIDE HCL 2 MG PO CAPS: 2.0000 mg | ORAL_CAPSULE | ORAL | 1 refills | Status: AC | PRN

## 2024-03-02 MED ORDER — WESTAB PLUS 27-1 MG PO TABS: 1.0000 | ORAL_TABLET | Freq: Every day | ORAL | 2 refills | Status: AC
